# Patient Record
Sex: Female | Born: 1985
Health system: Southern US, Community
[De-identification: ages and names within clinical notes are randomized; demographics above are authoritative.]

## PROBLEM LIST (undated history)

## (undated) ENCOUNTER — Inpatient Hospital Stay (HOSPITAL_COMMUNITY): Payer: Self-pay

## (undated) DIAGNOSIS — Z8759 Personal history of other complications of pregnancy, childbirth and the puerperium: Secondary | ICD-10-CM

## (undated) DIAGNOSIS — U071 COVID-19: Secondary | ICD-10-CM

## (undated) DIAGNOSIS — O139 Gestational [pregnancy-induced] hypertension without significant proteinuria, unspecified trimester: Secondary | ICD-10-CM

## (undated) DIAGNOSIS — L309 Dermatitis, unspecified: Secondary | ICD-10-CM

## (undated) DIAGNOSIS — I10 Essential (primary) hypertension: Secondary | ICD-10-CM

## (undated) DIAGNOSIS — J189 Pneumonia, unspecified organism: Secondary | ICD-10-CM

## (undated) DIAGNOSIS — G43909 Migraine, unspecified, not intractable, without status migrainosus: Secondary | ICD-10-CM

## (undated) DIAGNOSIS — G935 Compression of brain: Secondary | ICD-10-CM

## (undated) DIAGNOSIS — E282 Polycystic ovarian syndrome: Secondary | ICD-10-CM

## (undated) DIAGNOSIS — E119 Type 2 diabetes mellitus without complications: Secondary | ICD-10-CM

## (undated) DIAGNOSIS — Z8632 Personal history of gestational diabetes: Secondary | ICD-10-CM

## (undated) HISTORY — DX: Migraine, unspecified, not intractable, without status migrainosus: G43.909

## (undated) HISTORY — DX: Type 2 diabetes mellitus without complications: E11.9

## (undated) HISTORY — DX: Personal history of gestational diabetes: Z86.32

## (undated) HISTORY — PX: TONSILLECTOMY: SUR1361

## (undated) HISTORY — PX: LAPAROSCOPIC OOPHERECTOMY: SHX6507

## (undated) HISTORY — DX: Essential (primary) hypertension: I10

## (undated) HISTORY — PX: OVARIAN CYST REMOVAL: SHX89

## (undated) HISTORY — DX: Polycystic ovarian syndrome: E28.2

## (undated) HISTORY — DX: Dermatitis, unspecified: L30.9

## (undated) HISTORY — PX: LEEP: SHX91

## (undated) HISTORY — PX: WISDOM TOOTH EXTRACTION: SHX21

## (undated) HISTORY — DX: Personal history of other complications of pregnancy, childbirth and the puerperium: Z87.59

---

## 2006-10-11 ENCOUNTER — Inpatient Hospital Stay (HOSPITAL_COMMUNITY): Admission: AD | Admit: 2006-10-11 | Discharge: 2006-10-15 | Payer: Self-pay | Admitting: Family Medicine

## 2006-10-12 ENCOUNTER — Encounter (INDEPENDENT_AMBULATORY_CARE_PROVIDER_SITE_OTHER): Payer: Self-pay | Admitting: Specialist

## 2008-02-25 ENCOUNTER — Inpatient Hospital Stay (HOSPITAL_COMMUNITY): Admission: AD | Admit: 2008-02-25 | Discharge: 2008-02-25 | Payer: Self-pay | Admitting: Obstetrics and Gynecology

## 2011-01-05 NOTE — Op Note (Signed)
Julia Kline, Julia Kline               ACCOUNT NO.:  [id number]   MEDICAL RECORD NO.:  [id number]          PATIENT TYPE:  MAT   LOCATION:  MATC                          FACILITY:  [facility]   PHYSICIAN:  Sherron Monday, MD        DATE OF BIRTH:  10/05/1985   DATE OF PROCEDURE:  10/12/2006  DATE OF DISCHARGE:                               OPERATIVE REPORT   PREOPERATIVE DIAGNOSIS:  Left complex ovarian cyst with questionable  blood flow.   POSTOPERATIVE DIAGNOSES:  1. Torsed and necrotic left ovary.  2. Right paratubal cyst.   PROCEDURE:  1. Exploratory laparotomy.  2. Left salpingo-oophorectomy.  3. Right paratubal cyst excision.   SURGEON:  Sherron Monday, MD   ASSISTANT:  None.   ANESTHESIA:  General.   PATHOLOGY:  1. Left salpingo-oophorectomy.  2. Right paratubal cyst.   COMPLICATIONS:  None.   ESTIMATED BLOOD LOSS:  150 mL.   INTRAVENOUS FLUIDS:  1400     mL.   URINE OUTPUT:  400 mL of clear urine at the end of the procedure.   FINDINGS:  1. Normal-appearing uterus.  2. Necrotic left tube and ovary, approximately 12 cm, filling the cul-      de-sac.  3. Right paratubal cyst.   DISPOSITION:  Patient to the PACU in stable condition.   DESCRIPTION OF PROCEDURE:  After informed consent was reviewed with the  patient and her family including risks, benefits and alternatives of the  surgical procedure, she was transported to the operating room and placed  on the table in supine position.  General anesthesia was induced and  found to be adequate.  She was then prepped and draped in the normal  sterile fashion.  A Foley catheter was also sterilely placed.  A  Pfannenstiel skin incision was made approximately 2 fingerbreadths above  the pubic symphysis.  Using Bovie cautery, it was carried through to the  underlying layer of fascia.  Fascia was incised in the midline and the  incision was extended laterally with Mayo scissors.  The inferior aspect  of the fascial incision was  grasped with Kocher clamp, elevated and the  rectus muscles were dissected both bluntly and sharply.  The superior  portion of the incision was grasped in a similar fashion and the rectus  muscles were dissected off both bluntly and sharply.  The peritoneum was  easily identified and entered bluntly.  A pelvic survey was performed  revealing the above findings.  An Alexis skin retractor was placed,  verifying no bowel had been caught.  The ovary and tube were pulled from  her cul-de-sac, visualizing the tube and ovary.  The cyst was ruptured,  revealing a malodorous pus discharge.  The cornu was doubly ligated, as  was the IP ligament.  Hemostasis was assured.  Copious irrigation was  performed.  A survey revealed a right paratubal cyst, which was excised  without difficultywith Bovie cautery.  Otherwise, the findings were  normal.  The peritoneum was reapproximated.  The fascia was closed with  0 Vicryl after hemostasis was assured.  The subcuticular  adipose layer  with made hemostatic with Bovie cautery.  Irrigation was performed and  then a fat stitch was placed using plain gut and this again was closed  with staples.  Sponge, lap and needle count was correct x2 at the end of  the procedure per the operating room staff.  The patient tolerated the  procedure well and was awakened in stable condition and transported to  the PACU.      Sherron Monday, MD  Electronically Signed     JB/MEDQ  D:  10/12/2006  T:  10/12/2006  Job:  [id number]

## 2011-01-05 NOTE — Discharge Summary (Signed)
NAMEAMELIE, CARACCI               ACCOUNT NO.:  [id number]   MEDICAL RECORD NO.:  [id number]          PATIENT TYPE:  INP   LOCATION:  9318                          [facility]ACILITY:  [facility]   PHYSICIAN:  Sherron Monday, MD        DATE OF BIRTH:  Sep 13, 1985   DATE OF ADMISSION:  10/11/2006  DATE OF DISCHARGE:  10/15/2006                               DISCHARGE SUMMARY   ADMISSION DIAGNOSIS:  A 9-cm complex ovarian cyst, pelvic pain, likely  torsed ovary.   DISCHARGE DIAGNOSIS:  A 9-cm complex ovarian cyst, pelvic pain, likely  torsed ovary; torsed and necrotic ovary at exploratory laparotomy.   HISTORY OF PRESENT ILLNESS:  A 25 year old G0 with pelvic pain that  started on Monday night. She was seen at [facility] [place] on Tuesday and  diagnosed with 9-cm ovarian cyst. The patient states that they said that  they could see good flow and gave her OCPs to shrink the size of the  cyst. She has continued to have pain increased today with fever and  chills and decreased appetite.   PAST MEDICAL HISTORY:  Significant for what the patient states are boils  on her skin.   PAST SURGICAL HISTORY:  Not significant.   PAST OBSTETRICAL/GYNECOLOGICAL HISTORY:  She is a G0. No history of any  abnormal Pap smears. No history of any Pap smears. No sexually  transmitted diseases.   MEDICATIONS:  None.   ALLERGIES:  SUDAFED AND MORPHINE.   SOCIAL HISTORY:  She denies tobacco, alcohol or drug use. 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   FAMILY HISTORY:  Significant for a father with diabetes. No  hypertension. No cancer. A paternal uncle with coronary artery disease.   At admission, her T-max was 102.6, but she was afebrile at the time of  admission. Vital signs were stable.  In general, she was uncomfortable.  CARDIOVASCULAR:  Regular rate and rhythm.  LUNGS:  Clear to auscultation bilaterally.  ABDOMEN:  Soft, tender to palpation diffusely with decreased bowel  sounds.  EXTREMITIES:   Symmetric and nontender.  PELVIC EXAM:  Bimanual reveals the left greater than right tenderness  with difficulty palpating secondary to the patient's habitus.   LABORATORY VALUES:  An ultrasound revealed a 9-cm left complex cyst with  question of flow and a normal right ovary. Gonorrhea and chlamydia  cultures were negative. White count was 12.0, hemoglobin 9.6, platelets  214. UA revealed nitrites.   So, it was discussed with the patient the risks, benefits and  alternatives including bleeding, infection, damage to the surrounding  organs and the likely diagnosis of a torsed ovary requiring surgery. I  discussed with the patient again the concern of necrosis and infection,  inadequacy of birth control also to control if it is ovarian torsion.  She was also diagnosed with the UTI which was covered with her  antibiotics for surgery. She was given Ancef preoperatively and taken to  the OR for an exploratory laparotomy LSO. She also had a right peritubal  cyst excision at the exploratory laparotomy. Findings revealed necrotic  left tube and ovary approximately 12 to 14 cm in size filling the entire  cul-de-sac and a right peritubal cyst. The ovary and tube were necrotic,  and there was a large cyst filled with foul-smelling pus. Her  postoperative course was uncomplicated. She received 48 hours of  gentamicin, ampicillin and clindamycin IV, and on postoperative day 3,  she was discharged to home. At this time, she was ambulating well,  passing gas and tolerating a regular diet. Had remained afebrile off of  antibiotics for approximately 12 hours.  Her CBC at discharge, her white  count was 9.0, hemoglobin 7.4 which was increased from immediately  postoperatively of 6.7 and decreased from initial of 8.6. Her pain was  well controlled. Her staples were removed, and her incision was noted to  be clean, dry, and intact. She was discharged to home with routine  discharge instructions as well  as numbers to call if any questions or  problems. She was discharged to home with iron, Colace, Motrin and  Vicodin. She will follow up in approximately 2 weeks. She voiced  understanding of these instructions.      Sherron Monday, MD  Electronically Signed     JB/MEDQ  D:  10/15/2006  T:  10/15/2006  Job:  [id number]

## 2011-05-17 LAB — URINALYSIS, ROUTINE W REFLEX MICROSCOPIC
Bilirubin Urine: NEGATIVE
Glucose, UA: NEGATIVE
Ketones, ur: NEGATIVE
Leukocytes, UA: NEGATIVE
Nitrite: NEGATIVE
Protein, ur: NEGATIVE
Specific Gravity, Urine: 1.01
Urobilinogen, UA: 0.2
pH: 6

## 2011-05-17 LAB — CBC
HCT: 32.2 — ABNORMAL LOW
Hemoglobin: 10.4 — ABNORMAL LOW
MCHC: 32.3
MCV: 69.8 — ABNORMAL LOW
Platelets: 257
RBC: 4.61
RDW: 15.4
WBC: 6.1

## 2011-05-17 LAB — POCT PREGNANCY, URINE
Operator id: 120561
Preg Test, Ur: NEGATIVE

## 2011-05-17 LAB — URINE MICROSCOPIC-ADD ON

## 2014-07-23 ENCOUNTER — Encounter (HOSPITAL_BASED_OUTPATIENT_CLINIC_OR_DEPARTMENT_OTHER): Payer: Self-pay

## 2014-07-23 ENCOUNTER — Emergency Department (HOSPITAL_BASED_OUTPATIENT_CLINIC_OR_DEPARTMENT_OTHER)
Admission: EM | Admit: 2014-07-23 | Discharge: 2014-07-24 | Disposition: A | Discharge status: Home / Self Care | Payer: BC Managed Care – PPO | Attending: Emergency Medicine | Admitting: Emergency Medicine

## 2014-07-23 DIAGNOSIS — R51 Headache: Secondary | ICD-10-CM | POA: Insufficient documentation

## 2014-07-23 DIAGNOSIS — Z3202 Encounter for pregnancy test, result negative: Secondary | ICD-10-CM | POA: Insufficient documentation

## 2014-07-23 DIAGNOSIS — R519 Headache, unspecified: Secondary | ICD-10-CM

## 2014-07-23 LAB — URINALYSIS, ROUTINE W REFLEX MICROSCOPIC
Bilirubin Urine: NEGATIVE
Glucose, UA: NEGATIVE mg/dL
Ketones, ur: NEGATIVE mg/dL
Leukocytes, UA: NEGATIVE
Nitrite: NEGATIVE
Protein, ur: NEGATIVE mg/dL
Specific Gravity, Urine: 1.027 (ref 1.005–1.030)
Urobilinogen, UA: 0.2 mg/dL (ref 0.0–1.0)
pH: 5.5 (ref 5.0–8.0)

## 2014-07-23 LAB — URINE MICROSCOPIC-ADD ON

## 2014-07-23 LAB — PREGNANCY, URINE: Preg Test, Ur: NEGATIVE

## 2014-07-23 NOTE — ED Notes (Signed)
Pt reports ha for 2 weeks, today felt numbness to forehead area.

## 2014-07-24 ENCOUNTER — Emergency Department (HOSPITAL_BASED_OUTPATIENT_CLINIC_OR_DEPARTMENT_OTHER): Payer: BC Managed Care – PPO

## 2014-07-24 MED ORDER — SODIUM CHLORIDE 0.9 % IV BOLUS (SEPSIS)
1000.0000 mL | Freq: Once | INTRAVENOUS | Status: DC
  Administered 2014-07-24: 1000 mL via INTRAVENOUS

## 2014-07-24 MED ORDER — KETOROLAC TROMETHAMINE 30 MG/ML IJ SOLN
30.0000 mg | Freq: Once | INTRAMUSCULAR | Status: AC
  Administered 2014-07-24: 30 mg via INTRAVENOUS

## 2014-07-24 NOTE — ED Provider Notes (Addendum)
CSN: [id number]     Arrival date & time 07/23/14  2133 History   First MD Initiated Contact with Patient 07/24/14 0025     Chief Complaint  Patient presents with  . Headache     (Consider location/radiation/quality/duration/timing/severity/associated sxs/prior Treatment) HPI  This is a 28 year old female with about 2-3 week history of intermittent headaches. Headaches are located frontally. They have been moderate in severity. They have been transient and improved with Goody powders. She is here tonight because in addition to the headache she also has numbness and a feeling of heaviness in her for head. She denies photophobia, blurred vision, nausea or, vomiting, focal weakness or numbness. She states this is different than her usual headaches. She rates her headache an 8 out of 10 at the present time.  History reviewed. No pertinent past medical history. Past Surgical History  Procedure Laterality Date  . Ovarian cyst removal     No family history on file. History  Substance Use Topics  . Smoking status: Never Smoker   . Smokeless tobacco: Not on file  . Alcohol Use: No   OB History    No data available     Review of Systems  All other systems reviewed and are negative.   Allergies  Morphine and related  Home Medications   Prior to Admission medications   Medication Sig Start Date End Date Taking? Authorizing Provider  Multiple Vitamin (MULTIVITAMIN) tablet Take 1 tablet by mouth daily.   Yes Historical Provider, MD   BP 138/90 mmHg  Pulse 93  Temp(Src) 98.3 F (36.8 C) (Oral)  Resp 18  Ht 5\' 10"  (1.778 m)  Wt 240 lb (108.863 kg)  BMI 34.44 kg/m2  SpO2 100%  LMP 07/21/2014 (Exact Date)   Physical Exam  General: Well-developed, well-nourished female in no acute distress; appearance consistent with age of record HENT: normocephalic; atraumatic Eyes: pupils equal, round and reactive to light; extraocular muscles intact; no photophobia Neck: supple Heart:  regular rate and rhythm; no murmurs, rubs or gallops Lungs: clear to auscultation bilaterally Abdomen: soft; nondistended; nontender; bowel sounds present Extremities: No deformity; full range of motion; pulses normal Neurologic: Awake, alert and oriented; motor function intact in all extremities and symmetric; no facial droop; normal coordination speech; negative Romberg; normal finger to nose  Skin: Warm and dry Psychiatric: Normal mood and affect    ED Course  Procedures (including critical care time)   MDM   Nursing notes and vitals signs, including pulse oximetry, reviewed.  Summary of this visit's results, reviewed by myself:  Labs:  Results for orders placed or performed during the hospital encounter of 07/23/14 (from the past 24 hour(s))  Urinalysis, Routine w reflex microscopic     Status: Abnormal   Collection Time: 07/23/14  9:41 PM  Result Value Ref Range   Color, Urine YELLOW YELLOW   APPearance CLEAR CLEAR   Specific Gravity, Urine 1.027 1.005 - 1.030   pH 5.5 5.0 - 8.0   Glucose, UA NEGATIVE NEGATIVE mg/dL   Hgb urine dipstick LARGE (A) NEGATIVE   Bilirubin Urine NEGATIVE NEGATIVE   Ketones, ur NEGATIVE NEGATIVE mg/dL   Protein, ur NEGATIVE NEGATIVE mg/dL   Urobilinogen, UA 0.2 0.0 - 1.0 mg/dL   Nitrite NEGATIVE NEGATIVE   Leukocytes, UA NEGATIVE NEGATIVE  Pregnancy, urine     Status: None   Collection Time: 07/23/14  9:41 PM  Result Value Ref Range   Preg Test, Ur NEGATIVE NEGATIVE  Urine microscopic-add on  Status: Abnormal   Collection Time: 07/23/14  9:41 PM  Result Value Ref Range   Squamous Epithelial / LPF FEW (A) RARE   WBC, UA 3-6 <3 WBC/hpf   RBC / HPF 3-6 <3 RBC/hpf   Bacteria, UA MANY (A) RARE    Imaging Studies: Ct Head Wo Contrast  07/24/2014   CLINICAL DATA:  Subacute onset of headache for 2 weeks. Numbness about the forehead today. Initial encounter.  EXAM: CT HEAD WITHOUT CONTRAST  TECHNIQUE: Contiguous axial images were obtained  from the base of the skull through the vertex without intravenous contrast.  COMPARISON:  None.  FINDINGS: There is no evidence of acute infarction, mass lesion, or intra- or extra-axial hemorrhage on CT.  The posterior fossa, including the cerebellum, brainstem and fourth ventricle, is within normal limits. The third and lateral ventricles, and basal ganglia are unremarkable in appearance. The cerebral hemispheres are symmetric in appearance, with normal gray-white differentiation. No mass effect or midline shift is seen.  There is no evidence of fracture; visualized osseous structures are unremarkable in appearance. The orbits are within normal limits. The paranasal sinuses and mastoid air cells are well-aerated. No significant soft tissue abnormalities are seen.  IMPRESSION: Unremarkable noncontrast CT of the head.   Electronically Signed   By: Roanna RaiderJeffery  Chang M.D.   On: 07/24/2014 01:51   1:58 AM Feels better after IV Toradol. She was advised to continue over-the-counter NSAIDs as needed as these have been effective in the past.    Hanley SeamenJohn L Stephania Macfarlane, MD 07/24/14 0158  Hanley SeamenJohn L Shashank Kwasnik, MD 07/24/14 [id number]

## 2015-05-08 ENCOUNTER — Encounter (HOSPITAL_BASED_OUTPATIENT_CLINIC_OR_DEPARTMENT_OTHER): Payer: Self-pay

## 2015-05-08 ENCOUNTER — Emergency Department (HOSPITAL_BASED_OUTPATIENT_CLINIC_OR_DEPARTMENT_OTHER)
Admission: EM | Admit: 2015-05-08 | Discharge: 2015-05-08 | Disposition: A | Discharge status: Home / Self Care | Payer: 59 | Attending: Emergency Medicine | Admitting: Emergency Medicine

## 2015-05-08 DIAGNOSIS — J069 Acute upper respiratory infection, unspecified: Secondary | ICD-10-CM

## 2015-05-08 DIAGNOSIS — O9989 Other specified diseases and conditions complicating pregnancy, childbirth and the puerperium: Secondary | ICD-10-CM | POA: Insufficient documentation

## 2015-05-08 DIAGNOSIS — O99511 Diseases of the respiratory system complicating pregnancy, first trimester: Secondary | ICD-10-CM | POA: Diagnosis not present

## 2015-05-08 DIAGNOSIS — R079 Chest pain, unspecified: Secondary | ICD-10-CM | POA: Insufficient documentation

## 2015-05-08 DIAGNOSIS — Z79899 Other long term (current) drug therapy: Secondary | ICD-10-CM | POA: Insufficient documentation

## 2015-05-08 DIAGNOSIS — Z3A11 11 weeks gestation of pregnancy: Secondary | ICD-10-CM | POA: Diagnosis not present

## 2015-05-08 MED ORDER — GUAIFENESIN 100 MG/5ML PO LIQD: 100.0000 mg | ORAL | Status: DC | PRN

## 2015-05-08 NOTE — ED Provider Notes (Signed)
CSN: [id number]     Arrival date & time 05/08/15  1104 History   First MD Initiated Contact with Patient 05/08/15 1203     Chief Complaint  Patient presents with  . Nasal Congestion     (Consider location/radiation/quality/duration/timing/severity/associated sxs/prior Treatment) HPI Julia Kline is a 29 y.o. female with no medical problems, reports she is [redacted] weeks pregnant, presents to emergency department with cold symptoms. Patient states her symptoms started yesterday. She states she developed sore throat, nasal congestion, cough, chest wall pain with coughing. She states she call her OB/GYN yesterday and was told to take Benadryl and Tylenol. Patient states she took those medications yesterday and feels like they made her feel worse. She denies any abdominal pain, no vaginal discharge or bleeding, and no nausea or vomiting, no headache or neck pain or stiffness. She denies any fever at home. She denies any shortness of breath. No other complaints.  Past Medical History  Diagnosis Date  . Pregnant    Past Surgical History  Procedure Laterality Date  . Ovarian cyst removal     No family history on file. Social History  Substance Use Topics  . Smoking status: Never Smoker   . Smokeless tobacco: None  . Alcohol Use: No   OB History    Gravida Para Term Preterm AB TAB SAB Ectopic Multiple Living   1              Review of Systems  Constitutional: Negative for fever and chills.  HENT: Positive for congestion, ear pain, postnasal drip, rhinorrhea, sinus pressure and sore throat. Negative for trouble swallowing.   Respiratory: Positive for cough. Negative for shortness of breath.   Cardiovascular: Positive for chest pain.  Gastrointestinal: Negative for nausea, vomiting, abdominal pain and diarrhea.  Genitourinary: Negative for vaginal bleeding, vaginal discharge and pelvic pain.      Allergies  Morphine and related  Home Medications   Prior to Admission medications    Medication Sig Start Date End Date Taking? Authorizing Bensyn Bornemann  Multiple Vitamin (MULTIVITAMIN) tablet Take 1 tablet by mouth daily.    Historical Aikam Vinje, MD   BP 127/71 mmHg  Pulse 114  Temp(Src) 98.3 F (36.8 C) (Oral)  Resp 20  Ht  (1.753 m)  SpO2 97%  LMP 02/22/2015 Physical Exam  Constitutional: She is oriented to person, place, and time. She appears well-developed and well-nourished. No distress.  HENT:  Nasal congestion present, clear. Ear canals, TMs, artery are normal bilaterally. Oropharynx is erythematous, tonsils are not enlarged, no exudate, no edema. Postnasal drainage present.  Eyes: Conjunctivae are normal.  Neck: Normal range of motion. Neck supple.  Cardiovascular: Normal rate, regular rhythm and normal heart sounds.   Pulmonary/Chest: Effort normal and breath sounds normal. No respiratory distress. She has no wheezes. She has no rales.  Abdominal: Soft. There is no tenderness.  Neurological: She is alert and oriented to person, place, and time.  Skin: Skin is warm and dry.  Nursing note and vitals reviewed.   ED Course  Procedures (including critical care time) Labs Review Labs Reviewed - No data to display  Imaging Review No results found. I have personally reviewed and evaluated these images and lab results as part of my medical decision-making.   EKG Interpretation None      MDM   Final diagnoses:  URI (upper respiratory infection)    Patient with flulike symptoms. She is [redacted] weeks pregnant with no pelvic or abdominal complaints. She is afebrile  here. Her lungs are clear and exam. Her oxygen saturation is normal. Most likely viral infection given onset yesterday. We will treat symptomatically for now. Tylenol, Benadryl, Robitussin, and intranasal saline. I did tell her to come back or follow-up with her doctor if she is feeling worse. Patient voiced understanding.  Filed Vitals:   05/08/15 1112  BP: 127/71  Pulse: 114  Temp: 98.3 F  (36.8 C)  TempSrc: Oral  Resp: 20  Height:  (1.753 m)  SpO2: 97%       Jaynie Crumble, PA-C 05/08/15 1737  Rolland Porter, MD 05/11/15 1512

## 2015-05-08 NOTE — ED Notes (Signed)
Patient here with dry cough, congestion, and chest wall pain. Runny nose with same, didn't know what meds to take due to pregnancy

## 2015-05-08 NOTE — Discharge Instructions (Signed)
Take Tylenol for pain and fever. Intranasal saline every 2 hours. Benadryl for congestion as well. Take Robitussin as prescribed as needed for cough. Patient to drink plenty of fluids, rest, follow-up with a primary care doctor or OB/GYN. Return if worsening.    Upper Respiratory Infection, Adult An upper respiratory infection (URI) is also sometimes known as the common cold. The upper respiratory tract includes the nose, sinuses, throat, trachea, and bronchi. Bronchi are the airways leading to the lungs. Most people improve within 1 week, but symptoms can last up to 2 weeks. A residual cough may last even longer.  CAUSES Many different viruses can infect the tissues lining the upper respiratory tract. The tissues become irritated and inflamed and often become very moist. Mucus production is also common. A cold is contagious. You can easily spread the virus to others by oral contact. This includes kissing, sharing a glass, coughing, or sneezing. Touching your mouth or nose and then touching a surface, which is then touched by another person, can also spread the virus. SYMPTOMS  Symptoms typically develop 1 to 3 days after you come in contact with a cold virus. Symptoms vary from person to person. They may include:  Runny nose.  Sneezing.  Nasal congestion.  Sinus irritation.  Sore throat.  Loss of voice (laryngitis).  Cough.  Fatigue.  Muscle aches.  Loss of appetite.  Headache.  Low-grade fever. DIAGNOSIS  You might diagnose your own cold based on familiar symptoms, since most people get a cold 2 to 3 times a year. Your caregiver can confirm this based on your exam. Most importantly, your caregiver can check that your symptoms are not due to another disease such as strep throat, sinusitis, pneumonia, asthma, or epiglottitis. Blood tests, throat tests, and X-rays are not necessary to diagnose a common cold, but they may sometimes be helpful in excluding other more serious diseases.  Your caregiver will decide if any further tests are required. RISKS AND COMPLICATIONS  You may be at risk for a more severe case of the common cold if you smoke cigarettes, have chronic heart disease (such as heart failure) or lung disease (such as asthma), or if you have a weakened immune system. The very young and very old are also at risk for more serious infections. Bacterial sinusitis, middle ear infections, and bacterial pneumonia can complicate the common cold. The common cold can worsen asthma and chronic obstructive pulmonary disease (COPD). Sometimes, these complications can require emergency medical care and may be life-threatening. PREVENTION  The best way to protect against getting a cold is to practice good hygiene. Avoid oral or hand contact with people with cold symptoms. Wash your hands often if contact occurs. There is no clear evidence that vitamin C, vitamin E, echinacea, or exercise reduces the chance of developing a cold. However, it is always recommended to get plenty of rest and practice good nutrition. TREATMENT  Treatment is directed at relieving symptoms. There is no cure. Antibiotics are not effective, because the infection is caused by a virus, not by bacteria. Treatment may include:  Increased fluid intake. Sports drinks offer valuable electrolytes, sugars, and fluids.  Breathing heated mist or steam (vaporizer or shower).  Eating chicken soup or other clear broths, and maintaining good nutrition.  Getting plenty of rest.  Using gargles or lozenges for comfort.  Controlling fevers with ibuprofen or acetaminophen as directed by your caregiver.  Increasing usage of your inhaler if you have asthma. Zinc gel and zinc lozenges, taken  in the first 24 hours of the common cold, can shorten the duration and lessen the severity of symptoms. Pain medicines may help with fever, muscle aches, and throat pain. A variety of non-prescription medicines are available to treat  congestion and runny nose. Your caregiver can make recommendations and may suggest nasal or lung inhalers for other symptoms.  HOME CARE INSTRUCTIONS   Only take over-the-counter or prescription medicines for pain, discomfort, or fever as directed by your caregiver.  Use a warm mist humidifier or inhale steam from a shower to increase air moisture. This may keep secretions moist and make it easier to breathe.  Drink enough water and fluids to keep your urine clear or pale yellow.  Rest as needed.  Return to work when your temperature has returned to normal or as your caregiver advises. You may need to stay home longer to avoid infecting others. You can also use a face mask and careful hand washing to prevent spread of the virus. SEEK MEDICAL CARE IF:   After the first few days, you feel you are getting worse rather than better.  You need your caregiver's advice about medicines to control symptoms.  You develop chills, worsening shortness of breath, or brown or red sputum. These may be signs of pneumonia.  You develop yellow or brown nasal discharge or pain in the face, especially when you bend forward. These may be signs of sinusitis.  You develop a fever, swollen neck glands, pain with swallowing, or white areas in the back of your throat. These may be signs of strep throat. SEEK IMMEDIATE MEDICAL CARE IF:   You have a fever.  You develop severe or persistent headache, ear pain, sinus pain, or chest pain.  You develop wheezing, a prolonged cough, cough up blood, or have a change in your usual mucus (if you have chronic lung disease).  You develop sore muscles or a stiff neck. Document Released: 01/30/2001 Document Revised: 10/29/2011 Document Reviewed: 11/11/2013 [facility] Patient Information 2015 [place], [place]. This information is not intended to replace advice given to you by your health care provider. Make sure you discuss any questions you have with your health care  provider.

## 2017-03-01 LAB — CYTOLOGY - PAP: Pap: NEGATIVE

## 2017-03-04 ENCOUNTER — Inpatient Hospital Stay (HOSPITAL_COMMUNITY)
Admission: AD | Admit: 2017-03-04 | Discharge: 2017-03-05 | Disposition: A | Discharge status: Home / Self Care | Payer: Medicaid Other | Source: Ambulatory Visit | Attending: Obstetrics & Gynecology | Admitting: Obstetrics & Gynecology

## 2017-03-04 ENCOUNTER — Encounter (HOSPITAL_COMMUNITY): Payer: Self-pay | Admitting: *Deleted

## 2017-03-04 DIAGNOSIS — R109 Unspecified abdominal pain: Secondary | ICD-10-CM | POA: Diagnosis not present

## 2017-03-04 DIAGNOSIS — Z3A08 8 weeks gestation of pregnancy: Secondary | ICD-10-CM | POA: Diagnosis not present

## 2017-03-04 DIAGNOSIS — O99611 Diseases of the digestive system complicating pregnancy, first trimester: Secondary | ICD-10-CM

## 2017-03-04 DIAGNOSIS — O26891 Other specified pregnancy related conditions, first trimester: Secondary | ICD-10-CM | POA: Insufficient documentation

## 2017-03-04 DIAGNOSIS — K5900 Constipation, unspecified: Secondary | ICD-10-CM

## 2017-03-04 DIAGNOSIS — R1030 Lower abdominal pain, unspecified: Secondary | ICD-10-CM | POA: Diagnosis present

## 2017-03-04 HISTORY — DX: Compression of brain: G93.5

## 2017-03-04 HISTORY — DX: Gestational (pregnancy-induced) hypertension without significant proteinuria, unspecified trimester: O13.9

## 2017-03-04 LAB — URINALYSIS, ROUTINE W REFLEX MICROSCOPIC
Bilirubin Urine: NEGATIVE
Glucose, UA: NEGATIVE mg/dL
Hgb urine dipstick: NEGATIVE
Ketones, ur: 5 mg/dL — AB
Leukocytes, UA: NEGATIVE
Nitrite: NEGATIVE
Protein, ur: 30 mg/dL — AB
Specific Gravity, Urine: 1.03 (ref 1.005–1.030)
pH: 5 (ref 5.0–8.0)

## 2017-03-04 LAB — POCT PREGNANCY, URINE: Preg Test, Ur: POSITIVE — AB

## 2017-03-04 NOTE — MAU Note (Addendum)
PT SAYS SHE HAS PAIN IN LOWER ABD - STARTED    AT 12 NOON-  THEN BECAME WORSE AT 10PM-     [place] [facility] [place] - WAS SEEN LAST  ON 7-13-  ALL OK.  LAST SEX-    June.      NO MEDS  FOR PAIN 10/10- NO CRYING

## 2017-03-05 ENCOUNTER — Encounter (HOSPITAL_COMMUNITY): Payer: Self-pay | Admitting: *Deleted

## 2017-03-05 DIAGNOSIS — K5900 Constipation, unspecified: Secondary | ICD-10-CM

## 2017-03-05 DIAGNOSIS — Z3A08 8 weeks gestation of pregnancy: Secondary | ICD-10-CM

## 2017-03-05 DIAGNOSIS — R109 Unspecified abdominal pain: Secondary | ICD-10-CM

## 2017-03-05 DIAGNOSIS — O26891 Other specified pregnancy related conditions, first trimester: Secondary | ICD-10-CM

## 2017-03-05 DIAGNOSIS — O99611 Diseases of the digestive system complicating pregnancy, first trimester: Secondary | ICD-10-CM | POA: Diagnosis not present

## 2017-03-05 LAB — CBC
HCT: 32.3 % — ABNORMAL LOW (ref 36.0–46.0)
Hemoglobin: 10.5 g/dL — ABNORMAL LOW (ref 12.0–15.0)
MCH: 22.2 pg — ABNORMAL LOW (ref 26.0–34.0)
MCHC: 32.5 g/dL (ref 30.0–36.0)
MCV: 68.1 fL — ABNORMAL LOW (ref 78.0–100.0)
Platelets: 220 10*3/uL (ref 150–400)
RBC: 4.74 MIL/uL (ref 3.87–5.11)
RDW: 14.9 % (ref 11.5–15.5)
WBC: 8.6 10*3/uL (ref 4.0–10.5)

## 2017-03-05 MED ORDER — POLYETHYLENE GLYCOL 3350 17 GM/SCOOP PO POWD: 17.0000 g | Freq: Every day | ORAL | 2 refills | Status: DC | PRN

## 2017-03-05 NOTE — Discharge Instructions (Signed)
First Trimester of Pregnancy °The first trimester of pregnancy is from week 1 until the end of week 13 (months 1 through 3). A week after a sperm fertilizes an egg, the egg will implant on the wall of the uterus. This embryo will begin to develop into a baby. Genes from you and your partner will form the baby. The female genes will determine whether the baby will be a boy or a girl. At 6-8 weeks, the eyes and face will be formed, and the heartbeat can be seen on ultrasound. At the end of 12 weeks, all the baby's organs will be formed. °Now that you are pregnant, you will want to do everything you can to have a healthy baby. Two of the most important things are to get good prenatal care and to follow your health care provider's instructions. Prenatal care is all the medical care you receive before the baby's birth. This care will help prevent, find, and treat any problems during the pregnancy and childbirth. °Body changes during your first trimester °Your body goes through many changes during pregnancy. The changes vary from woman to woman. °· You may gain or lose a couple of pounds at first. °· You may feel sick to your stomach (nauseous) and you may throw up (vomit). If the vomiting is uncontrollable, call your health care provider. °· You may tire easily. °· You may develop headaches that can be relieved by medicines. All medicines should be approved by your health care provider. °· You may urinate more often. Painful urination may mean you have a bladder infection. °· You may develop heartburn as a result of your pregnancy. °· You may develop constipation because certain hormones are causing the muscles that push stool through your intestines to slow down. °· You may develop hemorrhoids or swollen veins (varicose veins). °· Your breasts may begin to grow larger and become tender. Your nipples may stick out more, and the tissue that surrounds them (areola) may become darker. °· Your gums may bleed and may be  sensitive to brushing and flossing. °· Dark spots or blotches (chloasma, mask of pregnancy) may develop on your face. This will likely fade after the baby is born. °· Your menstrual periods will stop. °· You may have a loss of appetite. °· You may develop cravings for certain kinds of food. °· You may have changes in your emotions from day to day, such as being excited to be pregnant or being concerned that something may go wrong with the pregnancy and baby. °· You may have more vivid and strange dreams. °· You may have changes in your hair. These can include thickening of your hair, rapid growth, and changes in texture. Some women also have hair loss during or after pregnancy, or hair that feels dry or thin. Your hair will most likely return to normal after your baby is born. ° °What to expect at prenatal visits °During a routine prenatal visit: °· You will be weighed to make sure you and the baby are growing normally. °· Your blood pressure will be taken. °· Your abdomen will be measured to track your baby's growth. °· The fetal heartbeat will be listened to between weeks 10 and 14 of your pregnancy. °· Test results from any previous visits will be discussed. ° °Your health care provider may ask you: °· How you are feeling. °· If you are feeling the baby move. °· If you have had any abnormal symptoms, such as leaking fluid, bleeding, severe headaches,   or abdominal cramping. °· If you are using any tobacco products, including cigarettes, chewing tobacco, and electronic cigarettes. °· If you have any questions. ° °Other tests that may be performed during your first trimester include: °· Blood tests to find your blood type and to check for the presence of any previous infections. The tests will also be used to check for low iron levels (anemia) and protein on red blood cells (Rh antibodies). Depending on your risk factors, or if you previously had diabetes during pregnancy, you may have tests to check for high blood  sugar that affects pregnant women (gestational diabetes). °· Urine tests to check for infections, diabetes, or protein in the urine. °· An ultrasound to confirm the proper growth and development of the baby. °· Fetal screens for spinal cord problems (spina bifida) and Down syndrome. °· HIV (human immunodeficiency virus) testing. Routine prenatal testing includes screening for HIV, unless you choose not to have this test. °· You may need other tests to make sure you and the baby are doing well. ° °Follow these instructions at home: °Medicines °· Follow your health care provider's instructions regarding medicine use. Specific medicines may be either safe or unsafe to take during pregnancy. °· Take a prenatal vitamin that contains at least 600 micrograms (mcg) of folic acid. °· If you develop constipation, try taking a stool softener if your health care provider approves. °Eating and drinking °· Eat a balanced diet that includes fresh fruits and vegetables, whole grains, good sources of protein such as meat, eggs, or tofu, and low-fat dairy. Your health care provider will help you determine the amount of weight gain that is right for you. °· Avoid raw meat and uncooked cheese. These carry germs that can cause birth defects in the baby. °· Eating four or five small meals rather than three large meals a day may help relieve nausea and vomiting. If you start to feel nauseous, eating a few soda crackers can be helpful. Drinking liquids between meals, instead of during meals, also seems to help ease nausea and vomiting. °· Limit foods that are high in fat and processed sugars, such as fried and sweet foods. °· To prevent constipation: °? Eat foods that are high in fiber, such as fresh fruits and vegetables, whole grains, and beans. °? Drink enough fluid to keep your urine clear or pale yellow. °Activity °· Exercise only as directed by your health care provider. Most women can continue their usual exercise routine during  pregnancy. Try to exercise for 30 minutes at least 5 days a week. Exercising will help you: °? Control your weight. °? Stay in shape. °? Be prepared for labor and delivery. °· Experiencing pain or cramping in the lower abdomen or lower back is a good sign that you should stop exercising. Check with your health care provider before continuing with normal exercises. °· Try to avoid standing for long periods of time. Move your legs often if you must stand in one place for a long time. °· Avoid heavy lifting. °· Wear low-heeled shoes and practice good posture. °· You may continue to have sex unless your health care provider tells you not to. °Relieving pain and discomfort °· Wear a good support bra to relieve breast tenderness. °· Take warm sitz baths to soothe any pain or discomfort caused by hemorrhoids. Use hemorrhoid cream if your health care provider approves. °· Rest with your legs elevated if you have leg cramps or low back pain. °· If you develop   varicose veins in your legs, wear support hose. Elevate your feet for 15 minutes, 3-4 times a day. Limit salt in your diet. Prenatal care  Schedule your prenatal visits by the twelfth week of pregnancy. They are usually scheduled monthly at first, then more often in the last 2 months before delivery.  Write down your questions. Take them to your prenatal visits.  Keep all your prenatal visits as told by your health care provider. This is important. Safety  Wear your seat belt at all times when driving.  Make a list of emergency phone numbers, including numbers for family, friends, the hospital, and police and fire departments. General instructions  Ask your health care provider for a referral to a local prenatal education class. Begin classes no later than the beginning of month 6 of your pregnancy.  Ask for help if you have counseling or nutritional needs during pregnancy. Your health care provider can offer advice or refer you to specialists for help  with various needs.  Do not use hot tubs, steam rooms, or saunas.  Do not douche or use tampons or scented sanitary pads.  Do not cross your legs for long periods of time.  Avoid cat litter boxes and soil used by cats. These carry germs that can cause birth defects in the baby and possibly loss of the fetus by miscarriage or stillbirth.  Avoid all smoking, herbs, alcohol, and medicines not prescribed by your health care provider. Chemicals in these products affect the formation and growth of the baby.  Do not use any products that contain nicotine or tobacco, such as cigarettes and e-cigarettes. If you need help quitting, ask your health care provider. You may receive counseling support and other resources to help you quit.  Schedule a dentist appointment. At home, brush your teeth with a soft toothbrush and be gentle when you floss. Contact a health care provider if:  You have dizziness.  You have mild pelvic cramps, pelvic pressure, or nagging pain in the abdominal area.  You have persistent nausea, vomiting, or diarrhea.  You have a bad smelling vaginal discharge.  You have pain when you urinate.  You notice increased swelling in your face, hands, legs, or ankles.  You are exposed to fifth disease or chickenpox.  You are exposed to [place] measles (rubella) and have never had it. Get help right away if:  You have a fever.  You are leaking fluid from your vagina.  You have spotting or bleeding from your vagina.  You have severe abdominal cramping or pain.  You have rapid weight gain or loss.  You vomit blood or material that looks like coffee grounds.  You develop a severe headache.  You have shortness of breath.  You have any kind of trauma, such as from a fall or a car accident. Summary  The first trimester of pregnancy is from week 1 until the end of week 13 (months 1 through 3).  Your body goes through many changes during pregnancy. The changes vary from  woman to woman.  You will have routine prenatal visits. During those visits, your health care provider will examine you, discuss any test results you may have, and talk with you about how you are feeling. This information is not intended to replace advice given to you by your health care provider. Make sure you discuss any questions you have with your health care provider. Document Released: 07/31/2001 Document Revised: 07/18/2016 Document Reviewed: 07/18/2016 Elsevier Interactive Patient Education  2017 Elsevier  Inc[employer] Constipation, Adult Constipation is when a person has fewer bowel movements in a week than normal, has difficulty having a bowel movement, or has stools that are dry, hard, or larger than normal. Constipation may be caused by an underlying condition. It may become worse with age if a person takes certain medicines and does not take in enough fluids. Follow these instructions at home: Eating and drinking   Eat foods that have a lot of fiber, such as fresh fruits and vegetables, whole grains, and beans.  Limit foods that are high in fat, low in fiber, or overly processed, such as french fries, hamburgers, cookies, candies, and soda.  Drink enough fluid to keep your urine clear or pale yellow. General instructions  Exercise regularly or as told by your health care provider.  Go to the restroom when you have the urge to go. Do not hold it in.  Take over-the-counter and prescription medicines only as told by your health care provider. These include any fiber supplements.  Practice pelvic floor retraining exercises, such as deep breathing while relaxing the lower abdomen and pelvic floor relaxation during bowel movements.  Watch your condition for any changes.  Keep all follow-up visits as told by your health care provider. This is important. Contact a health care provider if:  You have pain that gets worse.  You have a fever.  You do not have a bowel movement after 4  days.  You vomit.  You are not hungry.  You lose weight.  You are bleeding from the anus.  You have thin, pencil-like stools. Get help right away if:  You have a fever and your symptoms suddenly get worse.  You leak stool or have blood in your stool.  Your abdomen is bloated.  You have severe pain in your abdomen.  You feel dizzy or you faint. This information is not intended to replace advice given to you by your health care provider. Make sure you discuss any questions you have with your health care provider. Document Released: 05/04/2004 Document Revised: 02/24/2016 Document Reviewed: 01/25/2016 Elsevier Interactive Patient Education  2017 [employer]

## 2017-03-05 NOTE — MAU Provider Note (Signed)
History     CSN: [id number]  Arrival date and time: 03/04/17 2248   First Provider Initiated Contact with Patient 03/05/17 0011      Chief Complaint  Patient presents with  . Abdominal Pain   HPI Julia Kline is a 31 y.o. G2P1001 at [redacted]w[redacted]d who presents with lower abdominal cramping. She states it started yesterday morning but got worse around 2200. She rates the pain a 10/10 and has not tried anything for the pain. She denies any vaginal bleeding or abnormal discharge. Patient states she had a ultrasound and vaginal cultures on Friday and everything was "normal."  OB History    Gravida Para Term Preterm AB Living   2 1 1  0   1   SAB TAB Ectopic Multiple Live Births           1      Past Medical History:  Diagnosis Date  . Gestational diabetes    insulin controlled  . Hernia cerebri (HCC)   . Pregnancy induced hypertension   . Pregnant   . Vaginal Pap smear, abnormal     Past Surgical History:  Procedure Laterality Date  . LEEP    . OVARIAN CYST REMOVAL    . TONSILLECTOMY    . WISDOM TOOTH EXTRACTION      Family History  Problem Relation Age of Onset  . Diabetes Mother   . Diabetes Father   . Hypertension Maternal Grandmother     Social History  Substance Use Topics  . Smoking status: Never Smoker  . Smokeless tobacco: Never Used  . Alcohol use No    Allergies:  Allergies  Allergen Reactions  . Morphine And Related Rash    Prescriptions Prior to Admission  Medication Sig Dispense Refill Last Dose  . prenatal vitamin w/FE, FA (PRENATAL 1 + 1) 27-1 MG TABS tablet Take 1 tablet by mouth daily at 12 noon.   03/03/2017 at Unknown time  . Pyridoxine HCl (VITAMIN B-6) 500 MG tablet Take 500 mg by mouth 2 (two) times daily.   03/04/2017 at Unknown time  . guaiFENesin (ROBITUSSIN) 100 MG/5ML liquid Take 5-10 mLs (100-200 mg total) by mouth every 4 (four) hours as needed for cough. 60 mL 0   . Multiple Vitamin (MULTIVITAMIN) tablet Take 1 tablet by mouth  daily.       Review of Systems  Constitutional: Negative.  Negative for chills and fever.  HENT: Negative.   Respiratory: Negative.  Negative for shortness of breath.   Cardiovascular: Negative.  Negative for chest pain.  Gastrointestinal: Positive for abdominal pain and constipation. Negative for diarrhea, nausea and vomiting.  Genitourinary: Negative.  Negative for dysuria, vaginal bleeding and vaginal discharge.  Neurological: Negative.  Negative for dizziness and headaches.  Psychiatric/Behavioral: Negative.    Physical Exam   Blood pressure 126/84, pulse 90, temperature 98.3 F (36.8 C), temperature source Oral, resp. rate 20, height 5\' 9"  (1.753 m), weight 245 lb 8 oz (111.4 kg), last menstrual period 12/25/2016.  Physical Exam  Nursing note and vitals reviewed. Constitutional: She appears well-developed and well-nourished.  HENT:  Head: Normocephalic and atraumatic.  Eyes: Conjunctivae are normal. No scleral icterus.  Cardiovascular: Normal rate, regular rhythm and normal heart sounds.   Respiratory: Effort normal. No respiratory distress.  GI: Soft. There is tenderness.  Genitourinary: No vaginal discharge found.  Neurological: She is alert.  Skin: Skin is warm and dry.  Psychiatric: She has a normal mood and affect. Her behavior is normal.  Judgment and thought content normal.   Bimanual exam: Cervix 0/long/high, firm, anterior, neg CMT, uterus nontender, nonenlarged, adnexa without tenderness, enlargement, or mass  MAU Course  Procedures Results for orders placed or performed during the hospital encounter of 03/04/17 (from the past 24 hour(s))  Urinalysis, Routine w reflex microscopic     Status: Abnormal   Collection Time: 03/04/17 11:05 PM  Result Value Ref Range   Color, Urine YELLOW YELLOW   APPearance HAZY (A) CLEAR   Specific Gravity, Urine 1.030 1.005 - 1.030   pH 5.0 5.0 - 8.0   Glucose, UA NEGATIVE NEGATIVE mg/dL   Hgb urine dipstick NEGATIVE NEGATIVE    Bilirubin Urine NEGATIVE NEGATIVE   Ketones, ur 5 (A) NEGATIVE mg/dL   Protein, ur 30 (A) NEGATIVE mg/dL   Nitrite NEGATIVE NEGATIVE   Leukocytes, UA NEGATIVE NEGATIVE   RBC / HPF 6-30 0 - 5 RBC/hpf   WBC, UA 0-5 0 - 5 WBC/hpf   Bacteria, UA RARE (A) NONE SEEN   Squamous Epithelial / LPF 0-5 (A) NONE SEEN   Mucous PRESENT   Pregnancy, urine POC     Status: Abnormal   Collection Time: 03/04/17 11:19 PM  Result Value Ref Range   Preg Test, Ur POSITIVE (A) NEGATIVE  CBC     Status: Abnormal   Collection Time: 03/05/17 12:11 AM  Result Value Ref Range   WBC 8.6 4.0 - 10.5 K/uL   RBC 4.74 3.87 - 5.11 MIL/uL   Hemoglobin 10.5 (L) 12.0 - 15.0 g/dL   HCT 16.132.3 (L) 09.636.0 - 04.546.0 %   MCV 68.1 (L) 78.0 - 100.0 fL   MCH 22.2 (L) 26.0 - 34.0 pg   MCHC 32.5 30.0 - 36.0 g/dL   RDW 40.914.9 81.111.5 - 91.415.5 %   Platelets 220 150 - 400 K/uL    MDM UA, UPT CBC V. Tejasvi Brissett CNM performed bedside u/s: IUP with FHR 171 bpm Assessment and Plan   1. Abdominal pain during pregnancy in first trimester   2. Constipation during pregnancy in first trimester   3. [redacted] weeks gestation of pregnancy    -Discharge patient home in stable condition -Encouraged high fiber foods, increased water and miralax for constipation -[facility] as [facility] for [facility] care -Encouraged to return here or to other Urgent Care/ED if she develops worsening of symptoms, increase in pain, fever, or other concerning symptoms.   Cleone SlimCaroline Neill SNM 03/05/2017, 12:45 AM   I confirm that I have verified the information documented in the nurse midwife student's note and that I have also personally reperformed the physical exam and all medical decision making activities.Ectopic previously ruler out. No evidence of SAB in progress. Suspect pain is from Round ligament pain and or constipation especially since pain is mor in the area of GI system.    Katrinka BlazingSmith, [place], [place] 03/05/2017 3:39 AM

## 2017-04-01 LAB — OB RESULTS CONSOLE ABO/RH: RH Type: POSITIVE

## 2017-04-01 LAB — OB RESULTS CONSOLE GC/CHLAMYDIA
Chlamydia: NEGATIVE
Gonorrhea: NEGATIVE

## 2017-04-01 LAB — OB RESULTS CONSOLE RPR: RPR: NONREACTIVE

## 2017-04-01 LAB — OB RESULTS CONSOLE ANTIBODY SCREEN: Antibody Screen: NEGATIVE

## 2017-04-01 LAB — OB RESULTS CONSOLE TSH: TSH: 1.24

## 2017-04-01 LAB — OB RESULTS CONSOLE HEPATITIS B SURFACE ANTIGEN: Hepatitis B Surface Ag: NEGATIVE

## 2017-04-01 LAB — OB RESULTS CONSOLE HIV ANTIBODY (ROUTINE TESTING): HIV: NONREACTIVE

## 2017-04-01 LAB — OB RESULTS CONSOLE PLATELET COUNT: Platelets: 252

## 2017-04-01 LAB — OB RESULTS CONSOLE HGB/HCT, BLOOD
HCT: 34
Hemoglobin: 10.8

## 2017-04-01 LAB — OB RESULTS CONSOLE RUBELLA ANTIBODY, IGM: Rubella: IMMUNE

## 2017-04-09 ENCOUNTER — Telehealth: Payer: Self-pay | Admitting: General Practice

## 2017-04-09 NOTE — Telephone Encounter (Signed)
Called and left message on VM in regards to New OB appointment on 04/24/17 at 1:00pm with Dr. Alysia Penna.  Asked patient to give our office a call if unable to keep this appointment.

## 2017-04-23 ENCOUNTER — Encounter: Payer: Self-pay | Admitting: *Deleted

## 2017-04-23 DIAGNOSIS — Z3480 Encounter for supervision of other normal pregnancy, unspecified trimester: Secondary | ICD-10-CM

## 2017-04-23 DIAGNOSIS — O0990 Supervision of high risk pregnancy, unspecified, unspecified trimester: Secondary | ICD-10-CM | POA: Insufficient documentation

## 2017-04-24 ENCOUNTER — Encounter: Payer: Medicaid Other | Admitting: Obstetrics and Gynecology

## 2017-05-02 ENCOUNTER — Encounter: Payer: Self-pay | Admitting: Family Medicine

## 2017-05-02 DIAGNOSIS — O3440 Maternal care for other abnormalities of cervix, unspecified trimester: Secondary | ICD-10-CM | POA: Insufficient documentation

## 2017-05-02 DIAGNOSIS — Z8759 Personal history of other complications of pregnancy, childbirth and the puerperium: Secondary | ICD-10-CM | POA: Insufficient documentation

## 2017-05-02 DIAGNOSIS — Z8632 Personal history of gestational diabetes: Secondary | ICD-10-CM

## 2017-05-02 DIAGNOSIS — Z9889 Other specified postprocedural states: Secondary | ICD-10-CM | POA: Insufficient documentation

## 2017-05-02 DIAGNOSIS — O34219 Maternal care for unspecified type scar from previous cesarean delivery: Secondary | ICD-10-CM | POA: Insufficient documentation

## 2017-05-02 HISTORY — DX: Personal history of gestational diabetes: Z86.32

## 2017-05-02 HISTORY — DX: Personal history of other complications of pregnancy, childbirth and the puerperium: Z87.59

## 2017-05-02 NOTE — Progress Notes (Signed)
Updated Problem list from Care Everywhere Abstracted Pap Smear and OB labs Pt has not had any carrier screening, CF, Hgb eval, or SMA, at previous OB

## 2017-05-03 ENCOUNTER — Encounter: Payer: Self-pay | Admitting: Family Medicine

## 2017-05-03 ENCOUNTER — Ambulatory Visit (INDEPENDENT_AMBULATORY_CARE_PROVIDER_SITE_OTHER): Payer: Medicaid Other | Admitting: Family Medicine

## 2017-05-03 DIAGNOSIS — O09292 Supervision of pregnancy with other poor reproductive or obstetric history, second trimester: Secondary | ICD-10-CM

## 2017-05-03 DIAGNOSIS — O34219 Maternal care for unspecified type scar from previous cesarean delivery: Secondary | ICD-10-CM

## 2017-05-03 DIAGNOSIS — O0992 Supervision of high risk pregnancy, unspecified, second trimester: Secondary | ICD-10-CM

## 2017-05-03 DIAGNOSIS — O09299 Supervision of pregnancy with other poor reproductive or obstetric history, unspecified trimester: Secondary | ICD-10-CM

## 2017-05-03 DIAGNOSIS — Z8632 Personal history of gestational diabetes: Secondary | ICD-10-CM

## 2017-05-03 DIAGNOSIS — O0990 Supervision of high risk pregnancy, unspecified, unspecified trimester: Secondary | ICD-10-CM

## 2017-05-03 MED ORDER — ASPIRIN 81 MG PO CHEW: 81.0000 mg | CHEWABLE_TABLET | Freq: Every day | ORAL | 3 refills | Status: DC

## 2017-05-03 NOTE — Patient Instructions (Signed)
 Second Trimester of Pregnancy The second trimester is from week 14 through week 27 (months 4 through 6). The second trimester is often a time when you feel your best. Your body has adjusted to being pregnant, and you begin to feel better physically. Usually, morning sickness has lessened or quit completely, you may have more energy, and you may have an increase in appetite. The second trimester is also a time when the fetus is growing rapidly. At the end of the sixth month, the fetus is about 9 inches long and weighs about 1 pounds. You will likely begin to feel the baby move (quickening) between 16 and 20 weeks of pregnancy. Body changes during your second trimester Your body continues to go through many changes during your second trimester. The changes vary from woman to woman.  Your weight will continue to increase. You will notice your lower abdomen bulging out.  You may begin to get stretch marks on your hips, abdomen, and breasts.  You may develop headaches that can be relieved by medicines. The medicines should be approved by your health care provider.  You may urinate more often because the fetus is pressing on your bladder.  You may develop or continue to have heartburn as a result of your pregnancy.  You may develop constipation because certain hormones are causing the muscles that push waste through your intestines to slow down.  You may develop hemorrhoids or swollen, bulging veins (varicose veins).  You may have back pain. This is caused by: ? Weight gain. ? Pregnancy hormones that are relaxing the joints in your pelvis. ? A shift in weight and the muscles that support your balance.  Your breasts will continue to grow and they will continue to become tender.  Your gums may bleed and may be sensitive to brushing and flossing.  Dark spots or blotches (chloasma, mask of pregnancy) may develop on your face. This will likely fade after the baby is born.  A dark line from  your belly button to the pubic area (linea nigra) may appear. This will likely fade after the baby is born.  You may have changes in your hair. These can include thickening of your hair, rapid growth, and changes in texture. Some women also have hair loss during or after pregnancy, or hair that feels dry or thin. Your hair will most likely return to normal after your baby is born.  What to expect at prenatal visits During a routine prenatal visit:  You will be weighed to make sure you and the fetus are growing normally.  Your blood pressure will be taken.  Your abdomen will be measured to track your baby's growth.  The fetal heartbeat will be listened to.  Any test results from the previous visit will be discussed.  Your health care provider may ask you:  How you are feeling.  If you are feeling the baby move.  If you have had any abnormal symptoms, such as leaking fluid, bleeding, severe headaches, or abdominal cramping.  If you are using any tobacco products, including cigarettes, chewing tobacco, and electronic cigarettes.  If you have any questions.  Other tests that may be performed during your second trimester include:  Blood tests that check for: ? Low iron levels (anemia). ? High blood sugar that affects pregnant women (gestational diabetes) between 24 and 28 weeks. ? Rh antibodies. This is to check for a protein on red blood cells (Rh factor).  Urine tests to check for infections, diabetes,   or protein in the urine.  An ultrasound to confirm the proper growth and development of the baby.  An amniocentesis to check for possible genetic problems.  Fetal screens for spina bifida and Down syndrome.  HIV (human immunodeficiency virus) testing. Routine prenatal testing includes screening for HIV, unless you choose not to have this test.  Follow these instructions at home: Medicines  Follow your health care provider's instructions regarding medicine use. Specific  medicines may be either safe or unsafe to take during pregnancy.  Take a prenatal vitamin that contains at least 600 micrograms (mcg) of folic acid.  If you develop constipation, try taking a stool softener if your health care provider approves. Eating and drinking  Eat a balanced diet that includes fresh fruits and vegetables, whole grains, good sources of protein such as meat, eggs, or tofu, and low-fat dairy. Your health care provider will help you determine the amount of weight gain that is right for you.  Avoid raw meat and uncooked cheese. These carry germs that can cause birth defects in the baby.  If you have low calcium intake from food, talk to your health care provider about whether you should take a daily calcium supplement.  Limit foods that are high in fat and processed sugars, such as fried and sweet foods.  To prevent constipation: ? Drink enough fluid to keep your urine clear or pale yellow. ? Eat foods that are high in fiber, such as fresh fruits and vegetables, whole grains, and beans. Activity  Exercise only as directed by your health care provider. Most women can continue their usual exercise routine during pregnancy. Try to exercise for 30 minutes at least 5 days a week. Stop exercising if you experience uterine contractions.  Avoid heavy lifting, wear low heel shoes, and practice good posture.  A sexual relationship may be continued unless your health care provider directs you otherwise. Relieving pain and discomfort  Wear a good support bra to prevent discomfort from breast tenderness.  Take warm sitz baths to soothe any pain or discomfort caused by hemorrhoids. Use hemorrhoid cream if your health care provider approves.  Rest with your legs elevated if you have leg cramps or low back pain.  If you develop varicose veins, wear support hose. Elevate your feet for 15 minutes, 3-4 times a day. Limit salt in your diet. Prenatal Care  Write down your questions.  Take them to your prenatal visits.  Keep all your prenatal visits as told by your health care provider. This is important. Safety  Wear your seat belt at all times when driving.  Make a list of emergency phone numbers, including numbers for family, friends, the hospital, and police and fire departments. General instructions  Ask your health care provider for a referral to a local prenatal education class. Begin classes no later than the beginning of month 6 of your pregnancy.  Ask for help if you have counseling or nutritional needs during pregnancy. Your health care provider can offer advice or refer you to specialists for help with various needs.  Do not use hot tubs, steam rooms, or saunas.  Do not douche or use tampons or scented sanitary pads.  Do not cross your legs for long periods of time.  Avoid cat litter boxes and soil used by cats. These carry germs that can cause birth defects in the baby and possibly loss of the fetus by miscarriage or stillbirth.  Avoid all smoking, herbs, alcohol, and unprescribed drugs. Chemicals in these products   can affect the formation and growth of the baby.  Do not use any products that contain nicotine or tobacco, such as cigarettes and e-cigarettes. If you need help quitting, ask your health care provider.  Visit your dentist if you have not gone yet during your pregnancy. Use a soft toothbrush to brush your teeth and be gentle when you floss. Contact a health care provider if:  You have dizziness.  You have mild pelvic cramps, pelvic pressure, or nagging pain in the abdominal area.  You have persistent nausea, vomiting, or diarrhea.  You have a bad smelling vaginal discharge.  You have pain when you urinate. Get help right away if:  You have a fever.  You are leaking fluid from your vagina.  You have spotting or bleeding from your vagina.  You have severe abdominal cramping or pain.  You have rapid weight gain or weight  loss.  You have shortness of breath with chest pain.  You notice sudden or extreme swelling of your face, hands, ankles, feet, or legs.  You have not felt your baby move in over an hour.  You have severe headaches that do not go away when you take medicine.  You have vision changes. Summary  The second trimester is from week 14 through week 27 (months 4 through 6). It is also a time when the fetus is growing rapidly.  Your body goes through many changes during pregnancy. The changes vary from woman to woman.  Avoid all smoking, herbs, alcohol, and unprescribed drugs. These chemicals affect the formation and growth your baby.  Do not use any tobacco products, such as cigarettes, chewing tobacco, and e-cigarettes. If you need help quitting, ask your health care provider.  Contact your health care provider if you have any questions. Keep all prenatal visits as told by your health care provider. This is important. This information is not intended to replace advice given to you by your health care provider. Make sure you discuss any questions you have with your health care provider. Document Released: 07/31/2001 Document Revised: 01/12/2016 Document Reviewed: 10/07/2012 [employer] Interactive Patient Education  2017 [employer]   Breastfeeding Deciding to breastfeed is one of the best choices you can make for you and your baby. A change in hormones during pregnancy causes your breast tissue to grow and increases the number and size of your milk ducts. These hormones also allow proteins, sugars, and fats from your blood supply to make breast milk in your milk-producing glands. Hormones prevent breast milk from being released before your baby is born as well as prompt milk flow after birth. Once breastfeeding has begun, thoughts of your baby, as well as his or her sucking or crying, can stimulate the release of milk from your milk-producing glands. Benefits of breastfeeding For Your  Baby  Your first milk (colostrum) helps your baby's digestive system function better.  There are antibodies in your milk that help your baby fight off infections.  Your baby has a lower incidence of asthma, allergies, and sudden infant death syndrome.  The nutrients in breast milk are better for your baby than infant formulas and are designed uniquely for your baby's needs.  Breast milk improves your baby's brain development.  Your baby is less likely to develop other conditions, such as childhood obesity, asthma, or type 2 diabetes mellitus.  For You  Breastfeeding helps to create a very special bond between you and your baby.  Breastfeeding is convenient. Breast milk is always available at   the correct temperature and costs nothing.  Breastfeeding helps to burn calories and helps you lose the weight gained during pregnancy.  Breastfeeding makes your uterus contract to its prepregnancy size faster and slows bleeding (lochia) after you give birth.  Breastfeeding helps to lower your risk of developing type 2 diabetes mellitus, osteoporosis, and breast or ovarian cancer later in life.  Signs that your baby is hungry Early Signs of Hunger  Increased alertness or activity.  Stretching.  Movement of the head from side to side.  Movement of the head and opening of the mouth when the corner of the mouth or cheek is stroked (rooting).  Increased sucking sounds, smacking lips, cooing, sighing, or squeaking.  Hand-to-mouth movements.  Increased sucking of fingers or hands.  Late Signs of Hunger  Fussing.  Intermittent crying.  Extreme Signs of Hunger Signs of extreme hunger will require calming and consoling before your baby will be able to breastfeed successfully. Do not wait for the following signs of extreme hunger to occur before you initiate breastfeeding:  Restlessness.  A loud, strong cry.  Screaming.  Breastfeeding basics Breastfeeding Initiation  Find a  comfortable place to sit or lie down, with your neck and back well supported.  Place a pillow or rolled up blanket under your baby to bring him or her to the level of your breast (if you are seated). Nursing pillows are specially designed to help support your arms and your baby while you breastfeed.  Make sure that your baby's abdomen is facing your abdomen.  Gently massage your breast. With your fingertips, massage from your chest wall toward your nipple in a circular motion. This encourages milk flow. You may need to continue this action during the feeding if your milk flows slowly.  Support your breast with 4 fingers underneath and your thumb above your nipple. Make sure your fingers are well away from your nipple and your baby's mouth.  Stroke your baby's lips gently with your finger or nipple.  When your baby's mouth is open wide enough, quickly bring your baby to your breast, placing your entire nipple and as much of the colored area around your nipple (areola) as possible into your baby's mouth. ? More areola should be visible above your baby's upper lip than below the lower lip. ? Your baby's tongue should be between his or her lower gum and your breast.  Ensure that your baby's mouth is correctly positioned around your nipple (latched). Your baby's lips should create a seal on your breast and be turned out (everted).  It is common for your baby to suck about 2-3 minutes in order to start the flow of breast milk.  Latching Teaching your baby how to latch on to your breast properly is very important. An improper latch can cause nipple pain and decreased milk supply for you and poor weight gain in your baby. Also, if your baby is not latched onto your nipple properly, he or she may swallow some air during feeding. This can make your baby fussy. Burping your baby when you switch breasts during the feeding can help to get rid of the air. However, teaching your baby to latch on properly is  still the best way to prevent fussiness from swallowing air while breastfeeding. Signs that your baby has successfully latched on to your nipple:  Silent tugging or silent sucking, without causing you pain.  Swallowing heard between every 3-4 sucks.  Muscle movement above and in front of his or her   ears while sucking.  Signs that your baby has not successfully latched on to nipple:  Sucking sounds or smacking sounds from your baby while breastfeeding.  Nipple pain.  If you think your baby has not latched on correctly, slip your finger into the corner of your baby's mouth to break the suction and place it between your baby's gums. Attempt breastfeeding initiation again. Signs of Successful Breastfeeding Signs from your baby:  A gradual decrease in the number of sucks or complete cessation of sucking.  Falling asleep.  Relaxation of his or her body.  Retention of a small amount of milk in his or her mouth.  Letting go of your breast by himself or herself.  Signs from you:  Breasts that have increased in firmness, weight, and size 1-3 hours after feeding.  Breasts that are softer immediately after breastfeeding.  Increased milk volume, as well as a change in milk consistency and color by the fifth day of breastfeeding.  Nipples that are not sore, cracked, or bleeding.  Signs That Your Baby is Getting Enough Milk  Wetting at least 1-2 diapers during the first 24 hours after birth.  Wetting at least 5-6 diapers every 24 hours for the first week after birth. The urine should be clear or pale yellow by 5 days after birth.  Wetting 6-8 diapers every 24 hours as your baby continues to grow and develop.  At least 3 stools in a 24-hour period by age 5 days. The stool should be soft and yellow.  At least 3 stools in a 24-hour period by age 7 days. The stool should be seedy and yellow.  No loss of weight greater than 10% of birth weight during the first 3 days of age.  Average  weight gain of 4-7 ounces (113-198 g) per week after age 4 days.  Consistent daily weight gain by age 5 days, without weight loss after the age of 2 weeks.  After a feeding, your baby may spit up a small amount. This is common. Breastfeeding frequency and duration Frequent feeding will help you make more milk and can prevent sore nipples and breast engorgement. Breastfeed when you feel the need to reduce the fullness of your breasts or when your baby shows signs of hunger. This is called "breastfeeding on demand." Avoid introducing a pacifier to your baby while you are working to establish breastfeeding (the first 4-6 weeks after your baby is born). After this time you may choose to use a pacifier. Research has shown that pacifier use during the first year of a baby's life decreases the risk of sudden infant death syndrome (SIDS). Allow your baby to feed on each breast as long as he or she wants. Breastfeed until your baby is finished feeding. When your baby unlatches or falls asleep while feeding from the first breast, offer the second breast. Because newborns are often sleepy in the first few weeks of life, you may need to awaken your baby to get him or her to feed. Breastfeeding times will vary from baby to baby. However, the following rules can serve as a guide to help you ensure that your baby is properly fed:  Newborns (babies 4 weeks of age or younger) may breastfeed every 1-3 hours.  Newborns should not go longer than 3 hours during the day or 5 hours during the night without breastfeeding.  You should breastfeed your baby a minimum of 8 times in a 24-hour period until you begin to introduce solid foods to your   baby at around 6 months of age.  Breast milk pumping Pumping and storing breast milk allows you to ensure that your baby is exclusively fed your breast milk, even at times when you are unable to breastfeed. This is especially important if you are going back to work while you are still  breastfeeding or when you are not able to be present during feedings. Your lactation consultant can give you guidelines on how long it is safe to store breast milk. A breast pump is a machine that allows you to pump milk from your breast into a sterile bottle. The pumped breast milk can then be stored in a refrigerator or freezer. Some breast pumps are operated by hand, while others use electricity. Ask your lactation consultant which type will work best for you. Breast pumps can be purchased, but some hospitals and breastfeeding support groups lease breast pumps on a monthly basis. A lactation consultant can teach you how to hand express breast milk, if you prefer not to use a pump. Caring for your breasts while you breastfeed Nipples can become dry, cracked, and sore while breastfeeding. The following recommendations can help keep your breasts moisturized and healthy:  Avoid using soap on your nipples.  Wear a supportive bra. Although not required, special nursing bras and tank tops are designed to allow access to your breasts for breastfeeding without taking off your entire bra or top. Avoid wearing underwire-style bras or extremely tight bras.  Air dry your nipples for 3-4minutes after each feeding.  Use only cotton bra pads to absorb leaked breast milk. Leaking of breast milk between feedings is normal.  Use lanolin on your nipples after breastfeeding. Lanolin helps to maintain your skin's normal moisture barrier. If you use pure lanolin, you do not need to wash it off before feeding your baby again. Pure lanolin is not toxic to your baby. You may also hand express a few drops of breast milk and gently massage that milk into your nipples and allow the milk to air dry.  In the first few weeks after giving birth, some women experience extremely full breasts (engorgement). Engorgement can make your breasts feel heavy, warm, and tender to the touch. Engorgement peaks within 3-5 days after you give  birth. The following recommendations can help ease engorgement:  Completely empty your breasts while breastfeeding or pumping. You may want to start by applying warm, moist heat (in the shower or with warm water-soaked hand towels) just before feeding or pumping. This increases circulation and helps the milk flow. If your baby does not completely empty your breasts while breastfeeding, pump any extra milk after he or she is finished.  Wear a snug bra (nursing or regular) or tank top for 1-2 days to signal your body to slightly decrease milk production.  Apply ice packs to your breasts, unless this is too uncomfortable for you.  Make sure that your baby is latched on and positioned properly while breastfeeding.  If engorgement persists after 48 hours of following these recommendations, contact your health care provider or a lactation consultant. Overall health care recommendations while breastfeeding  Eat healthy foods. Alternate between meals and snacks, eating 3 of each per day. Because what you eat affects your breast milk, some of the foods may make your baby more irritable than usual. Avoid eating these foods if you are sure that they are negatively affecting your baby.  Drink milk, fruit juice, and water to satisfy your thirst (about 10 glasses a day).    Rest often, relax, and continue to take your prenatal vitamins to prevent fatigue, stress, and anemia.  Continue breast self-awareness checks.  Avoid chewing and smoking tobacco. Chemicals from cigarettes that pass into breast milk and exposure to secondhand smoke may harm your baby.  Avoid alcohol and drug use, including marijuana. Some medicines that may be harmful to your baby can pass through breast milk. It is important to ask your health care provider before taking any medicine, including all over-the-counter and prescription medicine as well as vitamin and herbal supplements. It is possible to become pregnant while breastfeeding.  If birth control is desired, ask your health care provider about options that will be safe for your baby. Contact a health care provider if:  You feel like you want to stop breastfeeding or have become frustrated with breastfeeding.  You have painful breasts or nipples.  Your nipples are cracked or bleeding.  Your breasts are red, tender, or warm.  You have a swollen area on either breast.  You have a fever or chills.  You have nausea or vomiting.  You have drainage other than breast milk from your nipples.  Your breasts do not become full before feedings by the fifth day after you give birth.  You feel sad and depressed.  Your baby is too sleepy to eat well.  Your baby is having trouble sleeping.  Your baby is wetting less than 3 diapers in a 24-hour period.  Your baby has less than 3 stools in a 24-hour period.  Your baby's skin or the white part of his or her eyes becomes yellow.  Your baby is not gaining weight by 5 days of age. Get help right away if:  Your baby is overly tired (lethargic) and does not want to wake up and feed.  Your baby develops an unexplained fever. This information is not intended to replace advice given to you by your health care provider. Make sure you discuss any questions you have with your health care provider. Document Released: 08/06/2005 Document Revised: 01/18/2016 Document Reviewed: 01/28/2013 [employer] Interactive Patient Education  2017 [employer]  

## 2017-05-06 ENCOUNTER — Encounter (INDEPENDENT_AMBULATORY_CARE_PROVIDER_SITE_OTHER): Payer: Medicaid Other | Admitting: *Deleted

## 2017-05-06 DIAGNOSIS — Z3482 Encounter for supervision of other normal pregnancy, second trimester: Secondary | ICD-10-CM | POA: Diagnosis not present

## 2017-05-06 NOTE — Progress Notes (Signed)
   PRENATAL VISIT NOTE  Subjective:  Julia Kline is a 31 y.o. G2P1001 at [redacted]w[redacted]d being seen today for transferring prenatal care.  Began care in [facility]. Desires to deliver at [facility]. She is currently monitored for the following issues for this high-risk pregnancy and has Supervision of high risk pregnancy, antepartum; Previous cesarean delivery affecting pregnancy, antepartum; Previous gestational diabetes mellitus, antepartum; Hx of preeclampsia, prior pregnancy, currently pregnant; Hx LEEP (loop electrosurgical excision procedure), cervix, pregnancy; and H/O unilateral oophorectomy on her problem list.  Patient reports no complaints.  Contractions: Not present. Vag. Bleeding: None.  Movement: Present. Denies leaking of fluid.   The following portions of the patient's history were reviewed and updated as appropriate: allergies, current medications, past family history, past medical history, past social history, past surgical history and problem list. Problem list updated.  Objective:   Vitals:   05/03/17 1025  BP: 116/83  Pulse: (!) 109  Weight: 237 lb (107.5 kg)    Fetal Status: Fetal Heart Rate (bpm): 158   Movement: Present     General:  Alert, oriented and cooperative. Patient is in no acute distress.  Skin: Skin is warm and dry. No rash noted.   Cardiovascular: Normal heart rate noted  Respiratory: Normal respiratory effort, no problems with respiration noted  Abdomen: Soft, gravid, appropriate for gestational age.  Pain/Pressure: Present     Pelvic: Cervical exam deferred        Extremities: Normal range of motion.  Edema: None  Mental Status:  Normal mood and affect. Normal behavior. Normal judgment and thought content.   Assessment and Plan:  Pregnancy: G2P1001 at [redacted]w[redacted]d  1. Supervision of high risk pregnancy, antepartum Quad screen Needs anatomy scan - [place] MFM OB DETAIL +14 WK; Future - AFP, Serum, Open Spina Bifida  2. Previous gestational diabetes mellitus,  antepartum On insulin last pregnancy Failed her 2 hour pp, but lost some weight no challenge this pregnancy A1C is 6-->not diagnostic reports CBGs are low < 100 when checking--continue few fasting and random CBGs here--consider early 2 hour challenge  3. Hx of preeclampsia, prior pregnancy, currently pregnant Begin Baby ASA - aspirin 81 MG chewable tablet; Chew 1 tablet (81 mg total) by mouth daily.  Dispense: 90 tablet; Refill: 3  4. Previous cesarean delivery affecting pregnancy, antepartum Considering TOLAC vs. RCS--risks discussed will consider.  General obstetric precautions including but not limited to vaginal bleeding, contractions, leaking of fluid and fetal movement were reviewed in detail with the patient. Please refer to After Visit Summary for other counseling recommendations.  Return in 4 weeks (on 05/31/2017).   Reva Bores, MD

## 2017-05-08 ENCOUNTER — Telehealth: Payer: Self-pay | Admitting: *Deleted

## 2017-05-08 DIAGNOSIS — O2242 Hemorrhoids in pregnancy, second trimester: Secondary | ICD-10-CM

## 2017-05-08 LAB — AFP, SERUM, OPEN SPINA BIFIDA
AFP MoM: 0.28
AFP Value: 14.7 ng/mL
Gest. Age on Collection Date: 17.1 weeks
Maternal Age At EDD: 31.9 yr
OSBR Risk 1 IN: 10000
Test Results:: NEGATIVE
Weight: 107 [lb_av]

## 2017-05-08 NOTE — Telephone Encounter (Signed)
Pt called in wanting something for hemorrhoids - has used prepH with no relief.

## 2017-05-09 MED ORDER — HYDROCORTISONE ACETATE 25 MG RE SUPP: 25.0000 mg | Freq: Two times a day (BID) | RECTAL | 1 refills | Status: DC

## 2017-05-09 NOTE — Telephone Encounter (Signed)
Anusol prescribed.

## 2017-05-14 ENCOUNTER — Encounter (HOSPITAL_COMMUNITY): Payer: Self-pay

## 2017-05-14 ENCOUNTER — Other Ambulatory Visit: Payer: Self-pay | Admitting: Family Medicine

## 2017-05-14 ENCOUNTER — Other Ambulatory Visit (HOSPITAL_COMMUNITY): Payer: Self-pay | Admitting: *Deleted

## 2017-05-14 ENCOUNTER — Ambulatory Visit (HOSPITAL_COMMUNITY)
Admission: RE | Admit: 2017-05-14 | Discharge: 2017-05-14 | Disposition: A | Discharge status: Home / Self Care | Payer: Medicaid Other | Source: Ambulatory Visit | Attending: Family Medicine | Admitting: Family Medicine

## 2017-05-14 DIAGNOSIS — Z3A18 18 weeks gestation of pregnancy: Secondary | ICD-10-CM

## 2017-05-14 DIAGNOSIS — O09292 Supervision of pregnancy with other poor reproductive or obstetric history, second trimester: Secondary | ICD-10-CM | POA: Insufficient documentation

## 2017-05-14 DIAGNOSIS — O34211 Maternal care for low transverse scar from previous cesarean delivery: Secondary | ICD-10-CM | POA: Diagnosis not present

## 2017-05-14 DIAGNOSIS — IMO0002 Reserved for concepts with insufficient information to code with codable children: Secondary | ICD-10-CM

## 2017-05-14 DIAGNOSIS — O99212 Obesity complicating pregnancy, second trimester: Secondary | ICD-10-CM | POA: Diagnosis not present

## 2017-05-14 DIAGNOSIS — Z3689 Encounter for other specified antenatal screening: Secondary | ICD-10-CM

## 2017-05-14 DIAGNOSIS — Z0489 Encounter for examination and observation for other specified reasons: Secondary | ICD-10-CM

## 2017-05-14 DIAGNOSIS — O24419 Gestational diabetes mellitus in pregnancy, unspecified control: Secondary | ICD-10-CM | POA: Insufficient documentation

## 2017-05-14 DIAGNOSIS — O0990 Supervision of high risk pregnancy, unspecified, unspecified trimester: Secondary | ICD-10-CM

## 2017-05-14 NOTE — Addendum Note (Signed)
Encounter addended by: Levonne Hubert, RDMS, RVT on: 05/14/2017  1:06 PM<BR>    Actions taken: Imaging Exam ended

## 2017-05-22 ENCOUNTER — Telehealth: Payer: Self-pay

## 2017-05-22 ENCOUNTER — Ambulatory Visit (INDEPENDENT_AMBULATORY_CARE_PROVIDER_SITE_OTHER): Payer: Medicaid Other | Admitting: Family Medicine

## 2017-05-22 VITALS — BP 119/83 | HR 99 | Wt 243.0 lb

## 2017-05-22 DIAGNOSIS — O09299 Supervision of pregnancy with other poor reproductive or obstetric history, unspecified trimester: Secondary | ICD-10-CM

## 2017-05-22 DIAGNOSIS — O24410 Gestational diabetes mellitus in pregnancy, diet controlled: Secondary | ICD-10-CM

## 2017-05-22 DIAGNOSIS — O3440 Maternal care for other abnormalities of cervix, unspecified trimester: Secondary | ICD-10-CM

## 2017-05-22 DIAGNOSIS — O0990 Supervision of high risk pregnancy, unspecified, unspecified trimester: Secondary | ICD-10-CM

## 2017-05-22 DIAGNOSIS — O99210 Obesity complicating pregnancy, unspecified trimester: Secondary | ICD-10-CM | POA: Insufficient documentation

## 2017-05-22 DIAGNOSIS — O34219 Maternal care for unspecified type scar from previous cesarean delivery: Secondary | ICD-10-CM

## 2017-05-22 DIAGNOSIS — Z9889 Other specified postprocedural states: Secondary | ICD-10-CM

## 2017-05-22 NOTE — Progress Notes (Signed)
   PRENATAL VISIT NOTE  Subjective:  Julia Kline is a 31 y.o. G2P1001 at [redacted]w[redacted]d being seen today for ongoing prenatal care.  She is currently monitored for the following issues for this high-risk pregnancy and has Supervision of high risk pregnancy, antepartum; Previous cesarean delivery affecting pregnancy, antepartum; Previous gestational diabetes mellitus, antepartum; Hx of preeclampsia, prior pregnancy, currently pregnant; Hx LEEP (loop electrosurgical excision procedure), cervix, pregnancy; H/O unilateral oophorectomy; Gestational diabetes; and Obesity affecting pregnancy, antepartum on her problem list.  Patient reports no complaints.  Contractions: Not present. Vag. Bleeding: None.  Movement: Present. Denies leaking of fluid.   The following portions of the patient's history were reviewed and updated as appropriate: allergies, current medications, past family history, past medical history, past social history, past surgical history and problem list. Problem list updated.  Objective:   Vitals:   05/22/17 1615  BP: 119/83  Pulse: 99  Weight: 243 lb (110.2 kg)   -2 lb (-0.907 kg)  Fetal Status: Fetal Heart Rate (bpm): 150 Fundal Height: 19 cm Movement: Present     General:  Alert, oriented and cooperative. Patient is in no acute distress.  Skin: Skin is warm and dry. No rash noted.   Cardiovascular: Normal heart rate noted  Respiratory: Normal respiratory effort, no problems with respiration noted  Abdomen: Soft, gravid, appropriate for gestational age.  Pain/Pressure: Absent     Pelvic: Cervical exam deferred        Extremities: Normal range of motion.  Edema: Trace  Mental Status:  Normal mood and affect. Normal behavior. Normal judgment and thought content.   Assessment and Plan:  Pregnancy: G2P1001 at [redacted]w[redacted]d  1. Previous cesarean delivery affecting pregnancy, antepartum TOLAC discussed  2. Supervision of high risk pregnancy, antepartum UTD, transferred care at  17wks  3. Diet controlled gestational diabetes mellitus (GDM), antepartum HA1C= 6.0% Needs early glucola- plan to do at 10/11  4. Hx of preeclampsia, prior pregnancy, currently pregnant Not taking ASA, counseled to start ASAP  5. History of loop electrosurgical excision procedure (LEEP) of cervix affecting pregnancy, antepartum   Preterm labor symptoms and general obstetric precautions including but not limited to vaginal bleeding, contractions, leaking of fluid and fetal movement were reviewed in detail with the patient. Please refer to After Visit Summary for other counseling recommendations.  Return in about 1 week (around 05/29/2017).  Future Appointments Date Time Provider Department Center  05/30/2017 10:00 AM Allie Bossier, MD [facility]  06/10/2017 9:30 AM [facility] [place] 1 [facility]     Federico Flake, MD

## 2017-05-29 NOTE — Telephone Encounter (Signed)
PLEASE NOTE: All timestamps contained within this report are represented as [place] Standard Time. CONFIDENTIALTY NOTICE: This fax transmission is intended only for the addressee. It contains information that is legally privileged, confidential or otherwise protected from use or disclosure. If you are not the intended recipient, you are strictly prohibited from reviewing, disclosing, copying using or disseminating any of this information or taking any action in reliance on or regarding this information. If you have received this fax in error, please notify us immediately by telephone so that we can arrange for its return to [place]. Phone: [phone], Toll-Free: [phone], Fax: [phone] Page: 1 of 2 Call Id: [id number] [facility] Day - Client TELEPHONE ADVICE RECORD [facility] Patient Name: Julia Kline Gender: Female DOB: 1986-05-04 Age: 31 Y 7 M 8 D Return Phone Number: [phone] (Primary), [phone] (Secondary) Address: City/State/Zip: [place] [address] Client [facility] Day - Client Client Site [facility] [place] - Day Physician AA - PHYSICIAN, NOT LISTED- MD Contact Type Call Who Is Calling Patient / Member / Family / Caregiver Call Type Triage / Clinical Relationship To Patient Self Return Phone Number [phone] (Secondary) Chief Complaint Dizziness Reason for Call Symptomatic / Request for Health Information Initial Comment Caller states she is [redacted] weeks pregnant and for the past 2 days she has been getting dizzy spells. Doctor Shawnie Pons not listed. Appointment Disposition EMR Appointment Not Necessary Info pasted into Epic No GOTO Facility Not Listed Center for [facility], Dr Shawnie Pons Translation No Nurse Assessment Nurse: Annye English, RN, Angelique Blonder Date/Time (Eastern Time): 05/22/2017 2:02:08 PM Confirm and document reason for call. If symptomatic, describe symptoms. ---Caller states she is [redacted] weeks  pregnant and for the past 2 days she has been getting dizzy spells. Dr Shawnie Pons is her OB/GYN and she verified the number dialed as the number directly to this MDO. Does the patient have any new or worsening symptoms? ---Yes Will a triage be completed? ---Yes Related visit to physician within the last 2 weeks? ---Yes Does the PT have any chronic conditions? (i.e. diabetes, asthma, etc.) ---No Is the patient pregnant or possibly pregnant? (Ask all females between the ages of 46-55) ---Yes How many weeks gestation? ---19.6 wks What is the estimated delivery date? ---2017-10-10 Total number of pregnancies including current? ---2 Number of live births? ---1 Have you felt decreased fetal movement? ---No Is this a behavioral health or substance abuse call? ---No PLEASE NOTE: All timestamps contained within this report are represented as [place] Standard Time. CONFIDENTIALTY NOTICE: This fax transmission is intended only for the addressee. It contains information that is legally privileged, confidential or otherwise protected from use or disclosure. If you are not the intended recipient, you are strictly prohibited from reviewing, disclosing, copying using or disseminating any of this information or taking any action in reliance on or regarding this information. If you have received this fax in error, please notify us immediately by telephone so that we can arrange for its return to [place]. Phone: [phone], Toll-Free: [phone], Fax: [phone] Page: 2 of 2 Call Id: [id number] Guidelines Guideline Title Affirmed Question Affirmed Notes Nurse Date/Time Lamount Cohen Time) Dizziness - Lightheadedness [1] MODERATE dizziness (e.g., interferes with normal activities) AND [2] has NOT been evaluated by physician for this (Exception: dizziness caused by heat exposure, sudden standing, or poor fluid intake) Carmon, RN, Angelique Blonder 05/22/2017 2:07:17 PM Disp. Time Lamount Cohen Time) Disposition Final  User 05/22/2017 1:59:24 PM Attempt made - message left Carmon, RN, Angelique Blonder 05/22/2017  2:14:45 PM Call Completed Stefano Gaul 05/22/2017 2:13:47 PM See Physician within 24 Hours Yes Carmon, RN, Leighton Ruff Disagree/Comply Comply Caller Understands Yes PreDisposition Call Doctor Care Advice Given Per Guideline SEE PHYSICIAN WITHIN 24 HOURS: FLUIDS: Drink several glasses of fruit juice, other clear fluids or water. This will improve hydration and blood glucose. If the weather is hot, make sure the fluids are cold. REST: Lie down with feet elevated for 1 hour. This will improve circulation and increase blood flow to the brain. CALL BACK IF: * Passes out (faints) * You become worse. CARE ADVICE given per Dizziness (Adult) guideline. Comments User: Greggory Stallion, RN Date/Time Lamount Cohen Time): 05/22/2017 2:13:06 PM Per Paulino Rily, Dr Shawnie Pons, OB/GYN is located at [facility] for [facility], [address] [place], [place], [phone] Advised will trans pt to the correct MDO as above for appt to be seen within 24 hrs. User: Greggory Stallion, RN Date/Time Lamount Cohen Time): 05/22/2017 2:14:34 PM Will not copy/paste in to [facility] as this is not a pt of this office. Referrals GO TO FACILITY OTHER - SPECIFY

## 2017-05-29 NOTE — Telephone Encounter (Signed)
I cannot see the conversation that was routed to me. I saw this patient at the [facility].

## 2017-05-30 ENCOUNTER — Other Ambulatory Visit (INDEPENDENT_AMBULATORY_CARE_PROVIDER_SITE_OTHER): Payer: Medicaid Other

## 2017-05-30 ENCOUNTER — Ambulatory Visit (INDEPENDENT_AMBULATORY_CARE_PROVIDER_SITE_OTHER): Payer: Medicaid Other | Admitting: Obstetrics & Gynecology

## 2017-05-30 DIAGNOSIS — Z23 Encounter for immunization: Secondary | ICD-10-CM

## 2017-05-30 DIAGNOSIS — O0992 Supervision of high risk pregnancy, unspecified, second trimester: Secondary | ICD-10-CM

## 2017-05-30 DIAGNOSIS — O0990 Supervision of high risk pregnancy, unspecified, unspecified trimester: Secondary | ICD-10-CM

## 2017-05-30 DIAGNOSIS — O24419 Gestational diabetes mellitus in pregnancy, unspecified control: Secondary | ICD-10-CM

## 2017-05-30 NOTE — Progress Notes (Signed)
   PRENATAL VISIT NOTE  Subjective:  Julia Kline is a 31 y.o. G2P1001 at [redacted]w[redacted]d being seen today for ongoing prenatal care.  She is currently monitored for the following issues for this low-risk pregnancy and has Supervision of high risk pregnancy, antepartum; Previous cesarean delivery affecting pregnancy, antepartum; Previous gestational diabetes mellitus, antepartum; Hx of preeclampsia, prior pregnancy, currently pregnant; Hx LEEP (loop electrosurgical excision procedure), cervix, pregnancy; H/O unilateral oophorectomy; Gestational diabetes; and Obesity affecting pregnancy, antepartum on her problem list.  Patient reports no complaints.  Contractions: Not present.  .  Movement: Present. Denies leaking of fluid.   The following portions of the patient's history were reviewed and updated as appropriate: allergies, current medications, past family history, past medical history, past social history, past surgical history and problem list. Problem list updated.  Objective:   Vitals:   05/30/17 0838  BP: 122/83  Pulse: 98  Weight: 245 lb 9.6 oz (111.4 kg)    Fetal Status: Fetal Heart Rate (bpm): 147   Movement: Present     General:  Alert, oriented and cooperative. Patient is in no acute distress.  Skin: Skin is warm and dry. No rash noted.   Cardiovascular: Normal heart rate noted  Respiratory: Normal respiratory effort, no problems with respiration noted  Abdomen: Soft, gravid, appropriate for gestational age.  Pain/Pressure: Absent     Pelvic: Cervical exam deferred        Extremities: Normal range of motion.  Edema: Trace  Mental Status:  Normal mood and affect. Normal behavior. Normal judgment and thought content.   Assessment and Plan:  Pregnancy: G2P1001 at [redacted]w[redacted]d  1. Supervision of high risk pregnancy, antepartum  Preterm labor symptoms and general obstetric precautions including but not limited to vaginal bleeding, contractions, leaking of fluid and fetal movement were  reviewed in detail with the patient. Please refer to After Visit Summary for other counseling recommendations.  No Follow-up on file.   Allie Bossier, MD

## 2017-05-31 ENCOUNTER — Encounter: Payer: Self-pay | Admitting: Obstetrics & Gynecology

## 2017-05-31 ENCOUNTER — Encounter: Payer: Medicaid Other | Admitting: Obstetrics and Gynecology

## 2017-05-31 ENCOUNTER — Telehealth: Payer: Self-pay | Admitting: *Deleted

## 2017-05-31 DIAGNOSIS — O24419 Gestational diabetes mellitus in pregnancy, unspecified control: Secondary | ICD-10-CM

## 2017-05-31 LAB — GLUCOSE TOLERANCE, 2 HOURS W/ 1HR
Glucose, 1 hour: 182 mg/dL — ABNORMAL HIGH (ref 65–179)
Glucose, 2 hour: 166 mg/dL — ABNORMAL HIGH (ref 65–152)
Glucose, Fasting: 90 mg/dL (ref 65–91)

## 2017-05-31 MED ORDER — ACCU-CHEK FASTCLIX LANCETS MISC: 1.0000 [IU] | Freq: Four times a day (QID) | 12 refills | Status: DC

## 2017-05-31 MED ORDER — ACCU-CHEK NANO SMARTVIEW W/DEVICE KIT: 1.0000 | PACK | 0 refills | Status: DC

## 2017-05-31 MED ORDER — GLUCOSE BLOOD VI STRP: ORAL_STRIP | 12 refills | Status: DC

## 2017-05-31 NOTE — Telephone Encounter (Signed)
Spoke to pt adv about results and N&D to be calling to make appt

## 2017-06-03 ENCOUNTER — Other Ambulatory Visit: Payer: Self-pay | Admitting: Obstetrics & Gynecology

## 2017-06-03 DIAGNOSIS — O24419 Gestational diabetes mellitus in pregnancy, unspecified control: Secondary | ICD-10-CM

## 2017-06-03 MED ORDER — ACCU-CHEK NANO SMARTVIEW W/DEVICE KIT: 1.0000 | PACK | 0 refills | Status: DC

## 2017-06-05 ENCOUNTER — Encounter: Payer: Medicaid Other | Attending: Obstetrics & Gynecology | Admitting: Registered"

## 2017-06-05 DIAGNOSIS — R7309 Other abnormal glucose: Secondary | ICD-10-CM

## 2017-06-05 DIAGNOSIS — O24419 Gestational diabetes mellitus in pregnancy, unspecified control: Secondary | ICD-10-CM | POA: Insufficient documentation

## 2017-06-06 ENCOUNTER — Encounter: Payer: Self-pay | Admitting: Registered"

## 2017-06-06 ENCOUNTER — Telehealth: Payer: Self-pay

## 2017-06-06 NOTE — Telephone Encounter (Signed)
[facility] called with questions about diabetic supplies; advised to contact Dr [place]irl [place] office;pharmacist voiced understanding.

## 2017-06-06 NOTE — Progress Notes (Signed)
Patient was seen on 06/05/2017 for Gestational Diabetes self-management class at [facility]. The following learning objectives were met by the patient during this course:   States the definition of Gestational Diabetes  States why dietary management is important in controlling blood glucose  Describes the effects each nutrient has on blood glucose levels  Demonstrates ability to create a balanced meal plan  Demonstrates carbohydrate counting   States when to check blood glucose levels  Demonstrates proper blood glucose monitoring techniques  States the effect of stress and exercise on blood glucose levels  States the importance of limiting caffeine and abstaining from alcohol and smoking  Blood glucose monitor given: Accu-Chek Guide Lot #  [id number] Exp: 07/05/18 Blood glucose reading: 87  Patient instructed to monitor glucose levels: FBS: 60 - <95 1 hour: <140 2 hour: <120  Patient received handouts:  Nutrition Diabetes and Pregnancy  Carbohydrate Counting List  Patient will be seen for follow-up as needed.

## 2017-06-10 ENCOUNTER — Other Ambulatory Visit (HOSPITAL_COMMUNITY): Payer: Self-pay | Admitting: Maternal and Fetal Medicine

## 2017-06-10 ENCOUNTER — Other Ambulatory Visit (HOSPITAL_COMMUNITY): Payer: Self-pay | Admitting: *Deleted

## 2017-06-10 ENCOUNTER — Ambulatory Visit (HOSPITAL_COMMUNITY)
Admission: RE | Admit: 2017-06-10 | Discharge: 2017-06-10 | Disposition: A | Discharge status: Home / Self Care | Payer: Medicaid Other | Source: Ambulatory Visit | Attending: Maternal and Fetal Medicine | Admitting: Maternal and Fetal Medicine

## 2017-06-10 ENCOUNTER — Encounter (HOSPITAL_COMMUNITY): Payer: Self-pay

## 2017-06-10 DIAGNOSIS — IMO0002 Reserved for concepts with insufficient information to code with codable children: Secondary | ICD-10-CM

## 2017-06-10 DIAGNOSIS — Z362 Encounter for other antenatal screening follow-up: Secondary | ICD-10-CM | POA: Diagnosis not present

## 2017-06-10 DIAGNOSIS — O99212 Obesity complicating pregnancy, second trimester: Secondary | ICD-10-CM

## 2017-06-10 DIAGNOSIS — O09292 Supervision of pregnancy with other poor reproductive or obstetric history, second trimester: Secondary | ICD-10-CM | POA: Insufficient documentation

## 2017-06-10 DIAGNOSIS — O09892 Supervision of other high risk pregnancies, second trimester: Secondary | ICD-10-CM | POA: Diagnosis not present

## 2017-06-10 DIAGNOSIS — Z0489 Encounter for examination and observation for other specified reasons: Secondary | ICD-10-CM

## 2017-06-10 DIAGNOSIS — Z3A22 22 weeks gestation of pregnancy: Secondary | ICD-10-CM | POA: Insufficient documentation

## 2017-06-10 DIAGNOSIS — O34219 Maternal care for unspecified type scar from previous cesarean delivery: Secondary | ICD-10-CM | POA: Insufficient documentation

## 2017-06-10 DIAGNOSIS — O24410 Gestational diabetes mellitus in pregnancy, diet controlled: Secondary | ICD-10-CM | POA: Diagnosis not present

## 2017-06-11 ENCOUNTER — Ambulatory Visit (HOSPITAL_COMMUNITY): Payer: Medicaid Other

## 2017-06-21 ENCOUNTER — Ambulatory Visit (INDEPENDENT_AMBULATORY_CARE_PROVIDER_SITE_OTHER): Payer: Medicaid Other | Admitting: Obstetrics & Gynecology

## 2017-06-21 VITALS — BP 109/73 | HR 106 | Wt 246.0 lb

## 2017-06-21 DIAGNOSIS — O0992 Supervision of high risk pregnancy, unspecified, second trimester: Secondary | ICD-10-CM

## 2017-06-21 DIAGNOSIS — O34219 Maternal care for unspecified type scar from previous cesarean delivery: Secondary | ICD-10-CM

## 2017-06-21 DIAGNOSIS — O09299 Supervision of pregnancy with other poor reproductive or obstetric history, unspecified trimester: Secondary | ICD-10-CM

## 2017-06-21 DIAGNOSIS — O24414 Gestational diabetes mellitus in pregnancy, insulin controlled: Secondary | ICD-10-CM

## 2017-06-21 DIAGNOSIS — O09292 Supervision of pregnancy with other poor reproductive or obstetric history, second trimester: Secondary | ICD-10-CM

## 2017-06-21 DIAGNOSIS — O0990 Supervision of high risk pregnancy, unspecified, unspecified trimester: Secondary | ICD-10-CM

## 2017-06-21 NOTE — Progress Notes (Signed)
   PRENATAL VISIT NOTE  Subjective:  Julia Kline is a 31 y.o. G2P1001 at 1947w1d being seen today for ongoing prenatal care.  She is currently monitored for the following issues for this high-risk pregnancy and has Supervision of high risk pregnancy, antepartum; Previous cesarean delivery affecting pregnancy, antepartum; Previous gestational diabetes mellitus, antepartum; Hx of preeclampsia, prior pregnancy, currently pregnant; Hx LEEP (loop electrosurgical excision procedure), cervix, pregnancy; H/O unilateral oophorectomy; GDM (gestational diabetes mellitus); and Obesity affecting pregnancy, antepartum on her problem list.  Patient reports no complaints.  Contractions: Not present. Vag. Bleeding: None.  Movement: Present. Denies leaking of fluid.   The following portions of the patient's history were reviewed and updated as appropriate: allergies, current medications, past family history, past medical history, past social history, past surgical history and problem list. Problem list updated.  Objective:   Vitals:   06/21/17 0935  BP: 109/73  Pulse: (!) 106  Weight: 246 lb (111.6 kg)    Fetal Status: Fetal Heart Rate (bpm): 154   Movement: Present     General:  Alert, oriented and cooperative. Patient is in no acute distress.  Skin: Skin is warm and dry. No rash noted.   Cardiovascular: Normal heart rate noted  Respiratory: Normal respiratory effort, no problems with respiration noted  Abdomen: Soft, gravid, appropriate for gestational age.  Pain/Pressure: Absent     Pelvic: Cervical exam deferred        Extremities: Normal range of motion.  Edema: Trace  Mental Status:  Normal mood and affect. Normal behavior. Normal judgment and thought content.   Assessment and Plan:  Pregnancy: G2P1001 at 7847w1d  1. gestational diabetes mellitus (GDM) in second trimester - on no meds - great sugars   2. Hx of preeclampsia, prior pregnancy, currently pregnant - on baby asa daily until 36  weeks  3. Previous cesarean delivery affecting pregnancy, antepartum - would like a TOLAC  4. Supervision of high risk pregnancy, antepartum - follow up MFM u/s q 4 weeks already scheduled  Preterm labor symptoms and general obstetric precautions including but not limited to vaginal bleeding, contractions, leaking of fluid and fetal movement were reviewed in detail with the patient. Please refer to After Visit Summary for other counseling recommendations.  No Follow-up on file.   Allie BossierMyra C Camia Dipinto, MD

## 2017-07-10 ENCOUNTER — Encounter (HOSPITAL_COMMUNITY): Payer: Self-pay

## 2017-07-10 ENCOUNTER — Other Ambulatory Visit (HOSPITAL_COMMUNITY): Payer: Self-pay | Admitting: Maternal and Fetal Medicine

## 2017-07-10 ENCOUNTER — Ambulatory Visit (HOSPITAL_COMMUNITY)
Admission: RE | Admit: 2017-07-10 | Discharge: 2017-07-10 | Disposition: A | Discharge status: Home / Self Care | Payer: Medicaid Other | Source: Ambulatory Visit | Attending: Obstetrics & Gynecology | Admitting: Obstetrics & Gynecology

## 2017-07-10 DIAGNOSIS — O99212 Obesity complicating pregnancy, second trimester: Secondary | ICD-10-CM

## 2017-07-10 DIAGNOSIS — O34219 Maternal care for unspecified type scar from previous cesarean delivery: Secondary | ICD-10-CM

## 2017-07-10 DIAGNOSIS — O99210 Obesity complicating pregnancy, unspecified trimester: Secondary | ICD-10-CM

## 2017-07-10 DIAGNOSIS — Z9889 Other specified postprocedural states: Secondary | ICD-10-CM

## 2017-07-10 DIAGNOSIS — Z3A26 26 weeks gestation of pregnancy: Secondary | ICD-10-CM

## 2017-07-10 DIAGNOSIS — O24410 Gestational diabetes mellitus in pregnancy, diet controlled: Secondary | ICD-10-CM

## 2017-07-10 DIAGNOSIS — O09299 Supervision of pregnancy with other poor reproductive or obstetric history, unspecified trimester: Secondary | ICD-10-CM | POA: Diagnosis present

## 2017-07-10 DIAGNOSIS — O3440 Maternal care for other abnormalities of cervix, unspecified trimester: Secondary | ICD-10-CM

## 2017-07-10 DIAGNOSIS — O0990 Supervision of high risk pregnancy, unspecified, unspecified trimester: Secondary | ICD-10-CM

## 2017-07-10 DIAGNOSIS — Z8632 Personal history of gestational diabetes: Secondary | ICD-10-CM

## 2017-07-19 ENCOUNTER — Ambulatory Visit (INDEPENDENT_AMBULATORY_CARE_PROVIDER_SITE_OTHER): Payer: Medicaid Other | Admitting: Obstetrics & Gynecology

## 2017-07-19 ENCOUNTER — Encounter: Payer: Self-pay | Admitting: Obstetrics & Gynecology

## 2017-07-19 VITALS — BP 125/82 | HR 103 | Wt 247.0 lb

## 2017-07-19 DIAGNOSIS — O24410 Gestational diabetes mellitus in pregnancy, diet controlled: Secondary | ICD-10-CM

## 2017-07-19 DIAGNOSIS — O09292 Supervision of pregnancy with other poor reproductive or obstetric history, second trimester: Secondary | ICD-10-CM

## 2017-07-19 DIAGNOSIS — O0990 Supervision of high risk pregnancy, unspecified, unspecified trimester: Secondary | ICD-10-CM

## 2017-07-19 DIAGNOSIS — O0992 Supervision of high risk pregnancy, unspecified, second trimester: Secondary | ICD-10-CM

## 2017-07-19 DIAGNOSIS — O09299 Supervision of pregnancy with other poor reproductive or obstetric history, unspecified trimester: Secondary | ICD-10-CM

## 2017-07-19 NOTE — Patient Instructions (Signed)
Return to clinic for any scheduled appointments or obstetric concerns, or go to [facility] for evaluation  

## 2017-07-19 NOTE — Progress Notes (Signed)
   PRENATAL VISIT NOTE  Subjective:  Julia Kline is a 31 y.o. G2P1001 at 2855w1d being seen today for ongoing prenatal care.  She is currently monitored for the following issues for this high-risk pregnancy and has Supervision of high risk pregnancy, antepartum; Previous cesarean delivery affecting pregnancy, antepartum; Previous gestational diabetes mellitus, antepartum; Hx of preeclampsia, prior pregnancy, currently pregnant; Hx LEEP (loop electrosurgical excision procedure), cervix, pregnancy; H/O unilateral oophorectomy; GDM (gestational diabetes mellitus); and Obesity affecting pregnancy, antepartum on their problem list.  Patient reports no complaints.  Contractions: Not present. Vag. Bleeding: None.  Movement: Present. Denies leaking of fluid.   The following portions of the patient's history were reviewed and updated as appropriate: allergies, current medications, past family history, past medical history, past social history, past surgical history and problem list. Problem list updated.  Objective:   Vitals:   07/19/17 1102  BP: 125/82  Pulse: (!) 103  Weight: 247 lb (112 kg)    Fetal Status: Fetal Heart Rate (bpm): 137   Movement: Present     General:  Alert, oriented and cooperative. Patient is in no acute distress.  Skin: Skin is warm and dry. No rash noted.   Cardiovascular: Normal heart rate noted  Respiratory: Normal respiratory effort, no problems with respiration noted  Abdomen: Soft, gravid, appropriate for gestational age.  Pain/Pressure: Present     Pelvic: Cervical exam deferred        Extremities: Normal range of motion.  Edema: Trace  Mental Status:  Normal mood and affect. Normal behavior. Normal judgment and thought content.   Assessment and Plan:  Pregnancy: G2P1001 at 355w1d  1. Diet controlled gestational diabetes mellitus (GDM), antepartum Reports stable BS, forgot log book today.  Scheduled for growth scan in 07/2017.  Continue diet and exercise. -  Hemoglobin A1c  2. Hx of preeclampsia, prior pregnancy, currently pregnant Stable BP, continue ASA.  3. Supervision of high risk pregnancy, antepartum Third trimester labs - CBC - RPR - HIV antibody Preterm labor symptoms and general obstetric precautions including but not limited to vaginal bleeding, contractions, leaking of fluid and fetal movement were reviewed in detail with the patient. Please refer to After Visit Summary for other counseling recommendations.  Return in about 2 weeks (around 08/02/2017) for OB Visit.   Jaynie CollinsUgonna Nevaya Nagele, MD

## 2017-07-20 LAB — CBC
Hematocrit: 31 % — ABNORMAL LOW (ref 34.0–46.6)
Hemoglobin: 10.1 g/dL — ABNORMAL LOW (ref 11.1–15.9)
MCH: 22.4 pg — ABNORMAL LOW (ref 26.6–33.0)
MCHC: 32.6 g/dL (ref 31.5–35.7)
MCV: 69 fL — ABNORMAL LOW (ref 79–97)
Platelets: 210 10*3/uL (ref 150–379)
RBC: 4.5 x10E6/uL (ref 3.77–5.28)
RDW: 15.9 % — ABNORMAL HIGH (ref 12.3–15.4)
WBC: 8.6 10*3/uL (ref 3.4–10.8)

## 2017-07-20 LAB — HEMOGLOBIN A1C
Est. average glucose Bld gHb Est-mCnc: 123 mg/dL
Hgb A1c MFr Bld: 5.9 % — ABNORMAL HIGH (ref 4.8–5.6)

## 2017-07-20 LAB — RPR: RPR Ser Ql: NONREACTIVE

## 2017-07-20 LAB — HIV ANTIBODY (ROUTINE TESTING W REFLEX): HIV Screen 4th Generation wRfx: NONREACTIVE

## 2017-07-31 ENCOUNTER — Telehealth: Payer: Self-pay | Admitting: *Deleted

## 2017-07-31 NOTE — Telephone Encounter (Signed)
Called pt to adv no CBG readings in BRX. Need to find out how she is keeping up readings. LM for her to rtn call.

## 2017-08-01 ENCOUNTER — Ambulatory Visit (INDEPENDENT_AMBULATORY_CARE_PROVIDER_SITE_OTHER): Payer: Medicaid Other | Admitting: Obstetrics and Gynecology

## 2017-08-01 VITALS — BP 114/74 | HR 80 | Wt 248.2 lb

## 2017-08-01 DIAGNOSIS — O09299 Supervision of pregnancy with other poor reproductive or obstetric history, unspecified trimester: Secondary | ICD-10-CM

## 2017-08-01 DIAGNOSIS — O24419 Gestational diabetes mellitus in pregnancy, unspecified control: Secondary | ICD-10-CM

## 2017-08-01 DIAGNOSIS — O34219 Maternal care for unspecified type scar from previous cesarean delivery: Secondary | ICD-10-CM

## 2017-08-01 DIAGNOSIS — O0990 Supervision of high risk pregnancy, unspecified, unspecified trimester: Secondary | ICD-10-CM

## 2017-08-01 DIAGNOSIS — O99210 Obesity complicating pregnancy, unspecified trimester: Secondary | ICD-10-CM

## 2017-08-01 MED ORDER — GLYBURIDE 2.5 MG PO TABS: 1.2500 mg | ORAL_TABLET | Freq: Every day | ORAL | 3 refills | Status: DC

## 2017-08-01 NOTE — Progress Notes (Signed)
Prenatal Visit Note Date: 08/01/2017 Clinic: [facility]  Subjective:  Julia Kline is a 31 y.o. G2P1001 at 4567w0d being seen today for ongoing prenatal care.  She is currently monitored for the following issues for this high-risk pregnancy and has Supervision of high risk pregnancy, antepartum; Previous cesarean delivery affecting pregnancy, antepartum; Previous gestational diabetes mellitus, antepartum; Hx of preeclampsia, prior pregnancy, currently pregnant; Hx LEEP (loop electrosurgical excision procedure), cervix, pregnancy; H/O unilateral oophorectomy; GDM, class A2; and Obesity affecting pregnancy, antepartum on their problem list.  Patient reports no complaints.   Contractions: Irregular. Vag. Bleeding: None.  Movement: Present. Denies leaking of fluid.   The following portions of the patient's history were reviewed and updated as appropriate: allergies, current medications, past family history, past medical history, past social history, past surgical history and problem list. Problem list updated.  Objective:   Vitals:   08/01/17 0951  BP: 114/74  Pulse: 80  Weight: 248 lb 3.2 oz (112.6 kg)    Fetal Status: Fetal Heart Rate (bpm): 145   Movement: Present     General:  Alert, oriented and cooperative. Patient is in no acute distress.  Skin: Skin is warm and dry. No rash noted.   Cardiovascular: Normal heart rate noted  Respiratory: Normal respiratory effort, no problems with respiration noted  Abdomen: Soft, gravid, appropriate for gestational age. Pain/Pressure: Present     Pelvic:  Cervical exam deferred        Extremities: Normal range of motion.  Edema: Trace  Mental Status: Normal mood and affect. Normal behavior. Normal judgment and thought content.   Urinalysis:      Assessment and Plan:  Pregnancy: G2P1001 at 3767w0d  1. Supervision of high risk pregnancy, antepartum Routine care. D/w pt re: BC nv  2. GDM, class A2 Patient consistently  checking am fastings which are elevated in the high 90s to 100s. So will start glyburide 1.25 qhs. Encouraged pt to check more during the day. The 2h lunch ones she does check are normal.  F/u u/s on 12/17. Start 2x/week testing at 32wks.   3. Previous cesarean delivery affecting pregnancy, antepartum vbac form signed and pt desires tolac for now. Prior op note reviewed with here  4. Obesity affecting pregnancy, antepartum  5. Hx of preeclampsia, prior pregnancy, currently pregnant Continue baby asa  Preterm labor symptoms and general obstetric precautions including but not limited to vaginal bleeding, contractions, leaking of fluid and fetal movement were reviewed in detail with the patient. Please refer to After Visit Summary for other counseling recommendations.  Return in about 1 week (around 08/08/2017) for rob.   Cowgill BingPickens, Julia Shaw, MD

## 2017-08-05 ENCOUNTER — Other Ambulatory Visit (HOSPITAL_COMMUNITY): Payer: Self-pay | Admitting: Maternal and Fetal Medicine

## 2017-08-05 ENCOUNTER — Ambulatory Visit (HOSPITAL_COMMUNITY)
Admission: RE | Admit: 2017-08-05 | Discharge: 2017-08-05 | Disposition: A | Discharge status: Home / Self Care | Payer: Medicaid Other | Source: Ambulatory Visit | Attending: Obstetrics & Gynecology | Admitting: Obstetrics & Gynecology

## 2017-08-05 ENCOUNTER — Encounter (HOSPITAL_COMMUNITY): Payer: Self-pay

## 2017-08-05 ENCOUNTER — Telehealth: Payer: Self-pay

## 2017-08-05 DIAGNOSIS — Z6835 Body mass index (BMI) 35.0-35.9, adult: Secondary | ICD-10-CM | POA: Diagnosis not present

## 2017-08-05 DIAGNOSIS — Z3A30 30 weeks gestation of pregnancy: Secondary | ICD-10-CM

## 2017-08-05 DIAGNOSIS — O24415 Gestational diabetes mellitus in pregnancy, controlled by oral hypoglycemic drugs: Secondary | ICD-10-CM | POA: Insufficient documentation

## 2017-08-05 DIAGNOSIS — O09299 Supervision of pregnancy with other poor reproductive or obstetric history, unspecified trimester: Secondary | ICD-10-CM

## 2017-08-05 DIAGNOSIS — O34219 Maternal care for unspecified type scar from previous cesarean delivery: Secondary | ICD-10-CM | POA: Diagnosis not present

## 2017-08-05 DIAGNOSIS — O99213 Obesity complicating pregnancy, third trimester: Secondary | ICD-10-CM | POA: Insufficient documentation

## 2017-08-05 DIAGNOSIS — O24410 Gestational diabetes mellitus in pregnancy, diet controlled: Secondary | ICD-10-CM

## 2017-08-05 NOTE — Telephone Encounter (Signed)
Received called from baby scripts regarding patient high blood sugar reading of 177. There was no answer I have left a message for patient to call us back.

## 2017-08-06 ENCOUNTER — Other Ambulatory Visit (HOSPITAL_COMMUNITY): Payer: Self-pay | Admitting: *Deleted

## 2017-08-06 ENCOUNTER — Other Ambulatory Visit: Payer: Self-pay | Admitting: *Deleted

## 2017-08-06 DIAGNOSIS — O24419 Gestational diabetes mellitus in pregnancy, unspecified control: Secondary | ICD-10-CM

## 2017-08-06 DIAGNOSIS — O24415 Gestational diabetes mellitus in pregnancy, controlled by oral hypoglycemic drugs: Secondary | ICD-10-CM

## 2017-08-14 ENCOUNTER — Encounter: Payer: Self-pay | Admitting: Obstetrics and Gynecology

## 2017-08-14 ENCOUNTER — Ambulatory Visit (INDEPENDENT_AMBULATORY_CARE_PROVIDER_SITE_OTHER): Payer: Medicaid Other | Admitting: Obstetrics and Gynecology

## 2017-08-14 VITALS — BP 90/60 | HR 97 | Wt 247.2 lb

## 2017-08-14 DIAGNOSIS — O0993 Supervision of high risk pregnancy, unspecified, third trimester: Secondary | ICD-10-CM

## 2017-08-14 DIAGNOSIS — O34219 Maternal care for unspecified type scar from previous cesarean delivery: Secondary | ICD-10-CM

## 2017-08-14 DIAGNOSIS — O24419 Gestational diabetes mellitus in pregnancy, unspecified control: Secondary | ICD-10-CM

## 2017-08-14 DIAGNOSIS — O99210 Obesity complicating pregnancy, unspecified trimester: Secondary | ICD-10-CM | POA: Insufficient documentation

## 2017-08-14 DIAGNOSIS — O0990 Supervision of high risk pregnancy, unspecified, unspecified trimester: Secondary | ICD-10-CM

## 2017-08-14 DIAGNOSIS — E669 Obesity, unspecified: Secondary | ICD-10-CM

## 2017-08-14 DIAGNOSIS — O99213 Obesity complicating pregnancy, third trimester: Secondary | ICD-10-CM

## 2017-08-14 MED ORDER — GLYBURIDE 2.5 MG PO TABS: ORAL_TABLET | ORAL | 3 refills | Status: DC

## 2017-08-14 NOTE — Progress Notes (Signed)
Prenatal Visit Note Date: 08/14/2017 Clinic: [facility]  Subjective:  Julia Kline is a 31 y.o. G2P1001 at 10139w6d being seen today for ongoing prenatal care.  She is currently monitored for the following issues for this high-risk pregnancy and has Supervision of high risk pregnancy, antepartum; Previous cesarean delivery affecting pregnancy, antepartum; Previous gestational diabetes mellitus, antepartum; Hx of preeclampsia, prior pregnancy, currently pregnant; Hx LEEP (loop electrosurgical excision procedure), cervix, pregnancy; H/O unilateral oophorectomy; GDM, class A2; Obesity affecting pregnancy, antepartum; and Obesity (BMI 30-39.9) on their problem list.  Patient reports no complaints.   Contractions: Not present. Vag. Bleeding: None.  Movement: Present. Denies leaking of fluid.   The following portions of the patient's history were reviewed and updated as appropriate: allergies, current medications, past family history, past medical history, past social history, past surgical history and problem list. Problem list updated.  Objective:   Vitals:   08/14/17 1049  BP: 90/60  Pulse: 97  Weight: 247 lb 3.2 oz (112.1 kg)    Fetal Status: Fetal Heart Rate (bpm): 151   Movement: Present     General:  Alert, oriented and cooperative. Patient is in no acute distress.  Skin: Skin is warm and dry. No rash noted.   Cardiovascular: Normal heart rate noted  Respiratory: Normal respiratory effort, no problems with respiration noted  Abdomen: Soft, gravid, appropriate for gestational age. Pain/Pressure: Present     Pelvic:  Cervical exam deferred        Extremities: Normal range of motion.  Edema: Trace  Mental Status: Normal mood and affect. Normal behavior. Normal judgment and thought content.   Urinalysis:      Assessment and Plan:  Pregnancy: G2P1001 at 2139w6d  1. GDM, class A2 Will increase to glyburide 3.75 with breakfast and 2.5 qhs from 1.25 qhs given AM  fastings 100s-120s and 2hr PPs mostly in the 140s. bpp on Friday. Normal growth earlier this month (83% and AC 93% with normal afi). Continue with ap testing.   2. Supervision of high risk pregnancy, antepartum D/w pt re: bc nv  3. Previous cesarean delivery affecting pregnancy, antepartum See prior notes  4. Obesity (BMI 30-39.9)  5. Obesity affecting pregnancy, antepartum  Preterm labor symptoms and general obstetric precautions including but not limited to vaginal bleeding, contractions, leaking of fluid and fetal movement were reviewed in detail with the patient. Please refer to After Visit Summary for other counseling recommendations.  Return in about 9 days (around 08/23/2017) for nst/afi/rob.   [place] BingPickens, Julia Sinnett, MD

## 2017-08-15 ENCOUNTER — Telehealth: Payer: Self-pay

## 2017-08-15 NOTE — Telephone Encounter (Signed)
Received baby scripts message for high glucose level reading on patient. Called patient she stated she is doing well today. She had a one burrito for lunch today. She stated the provider change her medicine around yesterday when she was seen. I advised patient we will continued to monitored her blood sugars levels and if she needs us to call us back.

## 2017-08-16 ENCOUNTER — Encounter (HOSPITAL_COMMUNITY): Payer: Self-pay

## 2017-08-16 ENCOUNTER — Other Ambulatory Visit: Payer: Self-pay | Admitting: Obstetrics and Gynecology

## 2017-08-16 ENCOUNTER — Ambulatory Visit (HOSPITAL_COMMUNITY)
Admission: RE | Admit: 2017-08-16 | Discharge: 2017-08-16 | Disposition: A | Discharge status: Home / Self Care | Payer: Medicaid Other | Source: Ambulatory Visit | Attending: Obstetrics and Gynecology | Admitting: Obstetrics and Gynecology

## 2017-08-16 DIAGNOSIS — O99213 Obesity complicating pregnancy, third trimester: Secondary | ICD-10-CM

## 2017-08-16 DIAGNOSIS — Z3A32 32 weeks gestation of pregnancy: Secondary | ICD-10-CM

## 2017-08-16 DIAGNOSIS — O24419 Gestational diabetes mellitus in pregnancy, unspecified control: Secondary | ICD-10-CM

## 2017-08-16 DIAGNOSIS — O24415 Gestational diabetes mellitus in pregnancy, controlled by oral hypoglycemic drugs: Secondary | ICD-10-CM | POA: Insufficient documentation

## 2017-08-16 NOTE — Procedures (Signed)
Julia Kline [birth year] 6541w1d  Fetus A Non-Stress Test Interpretation for 08/16/17  Indication: Unsatisfactory BPP  Fetal Heart Rate A Mode: External Baseline Rate (A): 140 bpm Variability: Moderate Accelerations: 15 x 15 Decelerations: Variable Multiple birth?: No  Uterine Activity Mode: Toco Contraction Frequency (min): none noted  Interpretation (Fetal Testing) Nonstress Test Interpretation: Reactive Comments: FHR tracing rev'd by Dr Marjo Bickerenney

## 2017-08-21 ENCOUNTER — Other Ambulatory Visit: Payer: Self-pay | Admitting: Obstetrics and Gynecology

## 2017-08-21 MED ORDER — GLYBURIDE 2.5 MG PO TABS: ORAL_TABLET | ORAL | 2 refills | Status: DC

## 2017-08-23 ENCOUNTER — Ambulatory Visit (INDEPENDENT_AMBULATORY_CARE_PROVIDER_SITE_OTHER): Payer: Medicaid Other | Admitting: Obstetrics and Gynecology

## 2017-08-23 ENCOUNTER — Encounter: Payer: Self-pay | Admitting: Obstetrics and Gynecology

## 2017-08-23 VITALS — BP 109/75 | HR 75 | Wt 250.2 lb

## 2017-08-23 DIAGNOSIS — O99210 Obesity complicating pregnancy, unspecified trimester: Secondary | ICD-10-CM

## 2017-08-23 DIAGNOSIS — O34219 Maternal care for unspecified type scar from previous cesarean delivery: Secondary | ICD-10-CM

## 2017-08-23 DIAGNOSIS — O24419 Gestational diabetes mellitus in pregnancy, unspecified control: Secondary | ICD-10-CM

## 2017-08-23 DIAGNOSIS — O09299 Supervision of pregnancy with other poor reproductive or obstetric history, unspecified trimester: Secondary | ICD-10-CM

## 2017-08-23 DIAGNOSIS — O0990 Supervision of high risk pregnancy, unspecified, unspecified trimester: Secondary | ICD-10-CM

## 2017-08-23 DIAGNOSIS — E669 Obesity, unspecified: Secondary | ICD-10-CM

## 2017-08-23 MED ORDER — GLYBURIDE 2.5 MG PO TABS: ORAL_TABLET | ORAL | 2 refills | Status: DC

## 2017-08-23 NOTE — Progress Notes (Signed)
  Prenatal Visit Note Date: 08/23/2017 Clinic: [facility]  Subjective:  Julia Kline is a 32 y.o. G2P1001 at 612w1d being seen today for ongoing prenatal care.  She is currently monitored for the following issues for this high-risk pregnancy and has Supervision of high risk pregnancy, antepartum; Previous cesarean delivery affecting pregnancy, antepartum; Previous gestational diabetes mellitus, antepartum; Hx of preeclampsia, prior pregnancy, currently pregnant; Hx LEEP (loop electrosurgical excision procedure), cervix, pregnancy; H/O unilateral oophorectomy; GDM, class A2; Obesity affecting pregnancy, antepartum; and Obesity (BMI 30-39.9) on their problem list.  Patient reports no complaints.   Contractions: Not present. Vag. Bleeding: None.  Movement: Present. Denies leaking of fluid.   The following portions of the patient's history were reviewed and updated as appropriate: allergies, current medications, past family history, past medical history, past social history, past surgical history and problem list. Problem list updated.  Objective:   Vitals:   08/23/17 1007  BP: 109/75  Pulse: 75  Weight: 250 lb 3.2 oz (113.5 kg)    Fetal Status: Fetal Heart Rate (bpm): NST   Movement: Present     General:  Alert, oriented and cooperative. Patient is in no acute distress.  Skin: Skin is warm and dry. No rash noted.   Cardiovascular: Normal heart rate noted  Respiratory: Normal respiratory effort, no problems with respiration noted  Abdomen: Soft, gravid, appropriate for gestational age. Pain/Pressure: Present     Pelvic:  Cervical exam deferred        Extremities: Normal range of motion.  Edema: Trace  Mental Status: Normal mood and affect. Normal behavior. Normal judgment and thought content.   Urinalysis:      Assessment and Plan:  Pregnancy: G2P1001 at 4612w1d  1. GDM, class A2 BS values are better but still elevated. AM fastings in the high 90s to 100s and  2hr PP in the 130s-140s. Will increase to glyburide 5/2.5 from 3.75/1.25. rNST and slight poly on u/s today due to MVP of 9. AFI 23-24, cephalic. Continue with 2x/week testing.   2. Supervision of high risk pregnancy, antepartum D/w pt re: bc nv - Fetal nonstress test; Future  3. Previous cesarean delivery affecting pregnancy, antepartum Desires tolac. Sign consent nv  4. Hx of preeclampsia, prior pregnancy, currently pregnant Continue low dose asa.  - US Fetal BPP W/O Non Stress; Future  5. Obesity affecting pregnancy, antepartum  6. Obesity (BMI 30-39.9)  Preterm labor symptoms and general obstetric precautions including but not limited to vaginal bleeding, contractions, leaking of fluid and fetal movement were reviewed in detail with the patient. Please refer to After Visit Summary for other counseling recommendations.  Return in about 1 week (around 08/30/2017) for nst/afi/rob.   [place] BingPickens, Julia Furney, MD

## 2017-08-27 ENCOUNTER — Ambulatory Visit (INDEPENDENT_AMBULATORY_CARE_PROVIDER_SITE_OTHER): Payer: Medicaid Other | Admitting: *Deleted

## 2017-08-27 ENCOUNTER — Other Ambulatory Visit (HOSPITAL_COMMUNITY)
Admission: RE | Admit: 2017-08-27 | Discharge: 2017-08-27 | Disposition: A | Discharge status: Home / Self Care | Payer: Medicaid Other | Source: Ambulatory Visit | Attending: Obstetrics and Gynecology | Admitting: Obstetrics and Gynecology

## 2017-08-27 DIAGNOSIS — O24419 Gestational diabetes mellitus in pregnancy, unspecified control: Secondary | ICD-10-CM

## 2017-08-27 DIAGNOSIS — O9989 Other specified diseases and conditions complicating pregnancy, childbirth and the puerperium: Secondary | ICD-10-CM | POA: Diagnosis not present

## 2017-08-27 DIAGNOSIS — N898 Other specified noninflammatory disorders of vagina: Secondary | ICD-10-CM

## 2017-08-27 LAB — POCT URINALYSIS DIPSTICK
Appearance: NORMAL
Bilirubin, UA: NEGATIVE
Blood, UA: NEGATIVE
Glucose, UA: NEGATIVE
Leukocytes, UA: NEGATIVE
Nitrite, UA: NEGATIVE
Protein, UA: NEGATIVE
Spec Grav, UA: 1.015 (ref 1.010–1.025)
Urobilinogen, UA: 0.2 E.U./dL
pH, UA: 6.5 (ref 5.0–8.0)

## 2017-08-27 MED ORDER — FLUCONAZOLE 150 MG PO TABS: ORAL_TABLET | ORAL | 3 refills | Status: DC

## 2017-08-27 NOTE — Progress Notes (Signed)
SUBJECTIVE:  32 y.o. female complains of white vaginal discharge for 4 day(s) and vaginal itching. Denies abnormal vaginal bleeding or significant pelvic pain or fever. Also has UTI symptoms of pain when starting to urinate. Denies history of known exposure to STD.  Patient's last menstrual period was 12/25/2016 (exact date).  OBJECTIVE:  She appears well, afebrile. Urine dipstick: positive for ketones.  ASSESSMENT:  Vaginal Discharge  Vaginal Odor   PLAN:  GC, chlamydia, trichomonas, BVAG, CVAG probe sent to lab. Treatment: To be determined once lab results are received ROV prn if symptoms persist or worsen.

## 2017-08-28 NOTE — Progress Notes (Signed)
Agree with nursing staff's documentation of this patient's clinic encounter.  Catalina AntiguaPeggy Snow Peoples, MD 08/28/2017 10:45 AM

## 2017-08-29 LAB — CERVICOVAGINAL ANCILLARY ONLY
Bacterial vaginitis: NEGATIVE
Candida vaginitis: NEGATIVE
Chlamydia: NEGATIVE
Neisseria Gonorrhea: NEGATIVE
Trichomonas: NEGATIVE

## 2017-08-30 ENCOUNTER — Ambulatory Visit (INDEPENDENT_AMBULATORY_CARE_PROVIDER_SITE_OTHER): Payer: Medicaid Other | Admitting: Obstetrics and Gynecology

## 2017-08-30 VITALS — BP 123/87 | HR 72 | Wt 248.0 lb

## 2017-08-30 DIAGNOSIS — O24419 Gestational diabetes mellitus in pregnancy, unspecified control: Secondary | ICD-10-CM

## 2017-08-30 LAB — CULTURE, OB URINE

## 2017-08-30 LAB — URINE CULTURE, OB REFLEX

## 2017-08-30 MED ORDER — GLYBURIDE 2.5 MG PO TABS: ORAL_TABLET | ORAL | 2 refills | Status: DC

## 2017-08-30 NOTE — Progress Notes (Signed)
Prenatal Visit Note Date: 08/30/2017 Clinic: [facility]  Subjective:  Julia Kline is a 32 y.o. G2P1001 at 5723w1d being seen today for ongoing prenatal care.  She is currently monitored for the following issues for this high-risk pregnancy and has Supervision of high risk pregnancy, antepartum; Previous cesarean delivery affecting pregnancy, antepartum; Previous gestational diabetes mellitus, antepartum; Hx of preeclampsia, prior pregnancy, currently pregnant; Hx LEEP (loop electrosurgical excision procedure), cervix, pregnancy; H/O unilateral oophorectomy; GDM, class A2; Obesity affecting pregnancy, antepartum; and Obesity (BMI 30-39.9) on their problem list.  Patient reports no complaints.   Contractions: Not present. Vag. Bleeding: None.  Movement: Present. Denies leaking of fluid.   The following portions of the patient's history were reviewed and updated as appropriate: allergies, current medications, past family history, past medical history, past social history, past surgical history and problem list. Problem list updated.  Objective:   Vitals:   08/30/17 1047  BP: 123/87  Pulse: 72  Weight: 248 lb (112.5 kg)    Fetal Status: Fetal Heart Rate (bpm): NST    Movement: Present     General:  Alert, oriented and cooperative. Patient is in no acute distress.  Skin: Skin is warm and dry. No rash noted.   Cardiovascular: Normal heart rate noted  Respiratory: Normal respiratory effort, no problems with respiration noted  Abdomen: Soft, gravid, appropriate for gestational age. Pain/Pressure: Present     Pelvic:  Cervical exam deferred        Extremities: Normal range of motion.  Edema: Trace  Mental Status: Normal mood and affect. Normal behavior. Normal judgment and thought content.   Urinalysis:      Assessment and Plan:  Pregnancy: G2P1001 at 1923w1d  1. GDM, class A2 rNST today and AFI normal, cephalic. BS values improved but only doing sporadic 2hr PP  values with AM fastings in the 100s. Will increase from glyburide 5/3.75 with breakfast and dinner 5/5  - Fetal nonstress test - US bedside; Future  Preterm labor symptoms and general obstetric precautions including but not limited to vaginal bleeding, contractions, leaking of fluid and fetal movement were reviewed in detail with the patient. Please refer to After Visit Summary for other counseling recommendations.  Return in about 1 week (around 09/06/2017) for rob only. and 1/22 nst/afi. 2wk nst/rob.   [place] BingPickens, Nalleli Largent, MD

## 2017-08-31 ENCOUNTER — Encounter (INDEPENDENT_AMBULATORY_CARE_PROVIDER_SITE_OTHER): Payer: Self-pay

## 2017-09-02 ENCOUNTER — Other Ambulatory Visit: Payer: Self-pay

## 2017-09-02 ENCOUNTER — Encounter: Payer: Self-pay | Admitting: *Deleted

## 2017-09-02 DIAGNOSIS — O2242 Hemorrhoids in pregnancy, second trimester: Secondary | ICD-10-CM

## 2017-09-02 MED ORDER — HYDROCORTISONE ACETATE 25 MG RE SUPP: 25.0000 mg | Freq: Two times a day (BID) | RECTAL | 1 refills | Status: DC

## 2017-09-02 MED ORDER — HYDROCORTISONE ACE-PRAMOXINE 1-1 % RE FOAM: 1.0000 | Freq: Two times a day (BID) | RECTAL | 0 refills | Status: DC

## 2017-09-02 NOTE — Telephone Encounter (Signed)
Patient called stating she was having so issue with her hemorrhoids,She is requesting something be called in. Per Dr.Dove patient can try procot form. This has been called in to patient pharmacy.

## 2017-09-03 ENCOUNTER — Ambulatory Visit (HOSPITAL_COMMUNITY)
Admission: RE | Admit: 2017-09-03 | Discharge: 2017-09-03 | Disposition: A | Discharge status: Home / Self Care | Payer: Medicaid Other | Source: Ambulatory Visit | Attending: Obstetrics & Gynecology | Admitting: Obstetrics & Gynecology

## 2017-09-04 ENCOUNTER — Encounter (HOSPITAL_COMMUNITY): Payer: Self-pay

## 2017-09-04 ENCOUNTER — Other Ambulatory Visit: Payer: Self-pay | Admitting: Obstetrics and Gynecology

## 2017-09-04 ENCOUNTER — Other Ambulatory Visit: Payer: Self-pay

## 2017-09-04 ENCOUNTER — Ambulatory Visit (HOSPITAL_COMMUNITY)
Admission: RE | Admit: 2017-09-04 | Discharge: 2017-09-04 | Disposition: A | Discharge status: Home / Self Care | Payer: Medicaid Other | Source: Ambulatory Visit | Attending: Obstetrics & Gynecology | Admitting: Obstetrics & Gynecology

## 2017-09-04 ENCOUNTER — Inpatient Hospital Stay (HOSPITAL_COMMUNITY)
Admission: AD | Admit: 2017-09-04 | Discharge: 2017-09-04 | Disposition: A | Discharge status: Home / Self Care | Payer: Medicaid Other | Source: Ambulatory Visit | Attending: Obstetrics & Gynecology | Admitting: Obstetrics & Gynecology

## 2017-09-04 DIAGNOSIS — Z3A34 34 weeks gestation of pregnancy: Secondary | ICD-10-CM | POA: Diagnosis not present

## 2017-09-04 DIAGNOSIS — O0990 Supervision of high risk pregnancy, unspecified, unspecified trimester: Secondary | ICD-10-CM

## 2017-09-04 DIAGNOSIS — E669 Obesity, unspecified: Secondary | ICD-10-CM | POA: Diagnosis not present

## 2017-09-04 DIAGNOSIS — Z362 Encounter for other antenatal screening follow-up: Secondary | ICD-10-CM | POA: Diagnosis present

## 2017-09-04 DIAGNOSIS — O09299 Supervision of pregnancy with other poor reproductive or obstetric history, unspecified trimester: Secondary | ICD-10-CM

## 2017-09-04 DIAGNOSIS — O24414 Gestational diabetes mellitus in pregnancy, insulin controlled: Secondary | ICD-10-CM | POA: Insufficient documentation

## 2017-09-04 DIAGNOSIS — Z9889 Other specified postprocedural states: Secondary | ICD-10-CM | POA: Insufficient documentation

## 2017-09-04 DIAGNOSIS — O133 Gestational [pregnancy-induced] hypertension without significant proteinuria, third trimester: Secondary | ICD-10-CM | POA: Diagnosis not present

## 2017-09-04 DIAGNOSIS — Z8249 Family history of ischemic heart disease and other diseases of the circulatory system: Secondary | ICD-10-CM | POA: Insufficient documentation

## 2017-09-04 DIAGNOSIS — Z833 Family history of diabetes mellitus: Secondary | ICD-10-CM | POA: Insufficient documentation

## 2017-09-04 DIAGNOSIS — Z7982 Long term (current) use of aspirin: Secondary | ICD-10-CM | POA: Insufficient documentation

## 2017-09-04 DIAGNOSIS — O99210 Obesity complicating pregnancy, unspecified trimester: Secondary | ICD-10-CM

## 2017-09-04 DIAGNOSIS — O24415 Gestational diabetes mellitus in pregnancy, controlled by oral hypoglycemic drugs: Secondary | ICD-10-CM

## 2017-09-04 DIAGNOSIS — Z885 Allergy status to narcotic agent status: Secondary | ICD-10-CM | POA: Insufficient documentation

## 2017-09-04 DIAGNOSIS — Z794 Long term (current) use of insulin: Secondary | ICD-10-CM | POA: Insufficient documentation

## 2017-09-04 DIAGNOSIS — Z8759 Personal history of other complications of pregnancy, childbirth and the puerperium: Secondary | ICD-10-CM | POA: Diagnosis not present

## 2017-09-04 DIAGNOSIS — Z8632 Personal history of gestational diabetes: Secondary | ICD-10-CM

## 2017-09-04 DIAGNOSIS — Z888 Allergy status to other drugs, medicaments and biological substances status: Secondary | ICD-10-CM | POA: Diagnosis not present

## 2017-09-04 DIAGNOSIS — O99213 Obesity complicating pregnancy, third trimester: Secondary | ICD-10-CM | POA: Insufficient documentation

## 2017-09-04 DIAGNOSIS — O34219 Maternal care for unspecified type scar from previous cesarean delivery: Secondary | ICD-10-CM | POA: Diagnosis not present

## 2017-09-04 DIAGNOSIS — Z3689 Encounter for other specified antenatal screening: Secondary | ICD-10-CM

## 2017-09-04 DIAGNOSIS — O3440 Maternal care for other abnormalities of cervix, unspecified trimester: Secondary | ICD-10-CM

## 2017-09-04 LAB — URINALYSIS, ROUTINE W REFLEX MICROSCOPIC
Bilirubin Urine: NEGATIVE
Glucose, UA: NEGATIVE mg/dL
Hgb urine dipstick: NEGATIVE
Ketones, ur: NEGATIVE mg/dL
Nitrite: NEGATIVE
Protein, ur: 30 mg/dL — AB
Specific Gravity, Urine: 1.019 (ref 1.005–1.030)
pH: 6 (ref 5.0–8.0)

## 2017-09-04 LAB — GLUCOSE, CAPILLARY
Glucose-Capillary: 59 mg/dL — ABNORMAL LOW (ref 65–99)
Glucose-Capillary: 101 mg/dL — ABNORMAL HIGH (ref 65–99)

## 2017-09-04 NOTE — MAU Provider Note (Signed)
History     CSN: [id number]  Arrival date and time: 09/04/17 1654   First Provider Initiated Contact with Patient 09/04/17 1807      Chief Complaint  Patient presents with  . Non-stress Test   HPI Julia Kline is a 32 y.o. G2P1001 at 72w6dwho presents from MFM for fetal monitoring. Had BPP today d/t GDM. BPP 6/8, off for breathing & patient had variable decel down to 73 during ultrasound. Patient denies abdominal pain, vaginal bleeding, LOF, or decreased fetal movement.  Upon arrival to MAU, patient reports feeling like her blood sugar is low. She takes glyburide 5 mg BID. Reports that she hasn't eaten anything since breakfast this morning.  OB History    Gravida Para Term Preterm AB Living   _0 0 0 1   SAB TAB Ectopic Multiple Live Births   0 0 0 0 1      Past Medical History:  Diagnosis Date  . Eczema   . Gestational diabetes    insulin controlled  . [place]   . Hx of preeclampsia, prior pregnancy, currently pregnant   . Hypertension   . Migraine   . PCOS (polycystic ovarian syndrome)   . Pregnancy induced hypertension   . Pregnant   . Vaginal Pap smear, abnormal     Past Surgical History:  Procedure Laterality Date  . CESAREAN SECTION  2017   Procedure: CESAREAN DELIVERY ONLY; Surgeon: KLina Sar MD; [facility]  . LAPAROSCOPIC OOPHERECTOMY Left   . LEEP    . OVARIAN CYST REMOVAL    . TONSILLECTOMY    . WISDOM TOOTH EXTRACTION      Family History  Problem Relation Age of Onset  . Diabetes Mother   . Diabetes Father   . Hypertension Maternal Grandmother     Social History   Tobacco Use  . Smoking status: Never Smoker  . Smokeless tobacco: Never Used  Substance Use Topics  . Alcohol use: No  . Drug use: No    Allergies:  Allergies  Allergen Reactions  . Pseudoephedrine Hcl Other (See Comments)  . Morphine And Related Rash    Medications Prior to Admission  Medication Sig  Dispense Refill Last Dose  . ACCU-CHEK FASTCLIX LANCETS MISC 1 Units by Percutaneous route 4 (four) times daily. 100 each 12 Taking  . aspirin 81 MG chewable tablet Chew 81 mg by mouth daily.   Taking  . Blood Glucose Monitoring Suppl (ACCU-CHEK NANO SMARTVIEW) w/Device KIT 1 kit by Subdermal route as directed. Check blood sugars for fasting, and two hours after breakfast, lunch and dinner (4 checks daily) 1 kit 0 Taking  . docusate sodium (COLACE) 100 MG capsule Take 100 mg by mouth.   Taking  . fluconazole (DIFLUCAN) 150 MG tablet Take 1 tab once, may take additional tab 3 days later if Sx persist (Patient not taking: Reported on 09/04/2017) 3 tablet 3 Not Taking  . glucose blood (ACCU-CHEK SMARTVIEW) test strip Use as instructed to check blood sugars 100 each 12 Taking  . glyBURIDE (DIABETA) 2.5 MG tablet Two tablets with breakfast and two tablets with dinner. 60 tablet 2 Taking  . hydrocortisone (ANUSOL-HC) 25 MG suppository Place 1 suppository (25 mg total) rectally 2 (two) times daily. 12 suppository 1 Taking  . hydrocortisone-pramoxine (PROCTOFOAM HC) rectal foam Place 1 applicator rectally 2 (two) times daily. 10 g 0   . polyethylene glycol powder (GLYCOLAX/MIRALAX) powder Take 17 g  by mouth daily as needed for moderate constipation. 500 g 2 Taking  . prenatal vitamin w/FE, FA (PRENATAL 1 + 1) 27-1 MG TABS tablet Take 1 tablet by mouth daily at 12 noon.   Taking    Review of Systems  Constitutional: Negative.   Gastrointestinal: Negative.   Genitourinary: Negative.   Neurological: Positive for light-headedness. Negative for headaches.   Physical Exam   Blood pressure 124/89, pulse 98, temperature 98.1 F (36.7 C), temperature source Oral, resp. rate 18, last menstrual period 12/25/2016, SpO2 100 %.  Physical Exam  Nursing note and vitals reviewed. Constitutional: She is oriented to person, place, and time. She appears well-developed and well-nourished. No distress.  HENT:  Head:  Normocephalic and atraumatic.  Eyes: Conjunctivae are normal. Right eye exhibits no discharge. Left eye exhibits no discharge. No scleral icterus.  Neck: Normal range of motion.  Respiratory: Effort normal. No respiratory distress.  GI: Soft. There is no tenderness.  Neurological: She is alert and oriented to person, place, and time.  Skin: Skin is warm and dry. She is not diaphoretic.  Psychiatric: She has a normal mood and affect. Her behavior is normal. Judgment and thought content normal.    MAU Course  Procedures Results for orders placed or performed during the hospital encounter of 09/04/17 (from the past 24 hour(s))  Urinalysis, Routine w reflex microscopic     Status: Abnormal   Collection Time: 09/04/17  5:07 PM  Result Value Ref Range   Color, Urine YELLOW YELLOW   APPearance CLOUDY (A) CLEAR   Specific Gravity, Urine 1.019 1.005 - 1.030   pH 6.0 5.0 - 8.0   Glucose, UA NEGATIVE NEGATIVE mg/dL   Hgb urine dipstick NEGATIVE NEGATIVE   Bilirubin Urine NEGATIVE NEGATIVE   Ketones, ur NEGATIVE NEGATIVE mg/dL   Protein, ur 30 (A) NEGATIVE mg/dL   Nitrite NEGATIVE NEGATIVE   Leukocytes, UA TRACE (A) NEGATIVE   RBC / HPF 0-5 0 - 5 RBC/hpf   WBC, UA 6-30 0 - 5 WBC/hpf   Bacteria, UA MANY (A) NONE SEEN   Squamous Epithelial / LPF 6-30 (A) NONE SEEN   Mucus PRESENT   Glucose, capillary     Status: Abnormal   Collection Time: 09/04/17  5:33 PM  Result Value Ref Range   Glucose-Capillary 59 (L) 65 - 99 mg/dL  Glucose, capillary     Status: Abnormal   Collection Time: 09/04/17  6:32 PM  Result Value Ref Range   Glucose-Capillary 101 (H) 65 - 99 mg/dL    MDM CBG 59 --- pt ate sandwich that her husband brought her & recheck was 101 -- pt reports improvement in symptoms.  NST:  Baseline: 140 bpm, Variability: Good {> 6 bpm), Accelerations: Reactive and Decelerations: Absent Reactive fetal tracing Patient has f/u in office tomorrow  Assessment and Plan  A: 1. NST  (non-stress test) reactive   2. Gestational diabetes mellitus (GDM) in third trimester controlled on oral hypoglycemic drug   3. [redacted] weeks gestation of pregnancy    P: Discharge home Stressed importance of eating regularly throughout the day esp while taking glyburide to avoid hypoglycemic episodes Fetal kick count form   Erin Lawrence 09/04/2017, 6:40 PM  

## 2017-09-04 NOTE — MAU Note (Signed)
Pt presents MAU from MFM due to fetal deceleration and BPP 6/8. Pt has had lower abdominal cramping since last night. Pt denies VB and LOF. +FM

## 2017-09-04 NOTE — Discharge Instructions (Signed)
Diabetes Mellitus and Nutrition When you have diabetes (diabetes mellitus), it is very important to have healthy eating habits because your blood sugar (glucose) levels are greatly affected by what you eat and drink. Eating healthy foods in the appropriate amounts, at about the same times every day, can help you:  Control your blood glucose.  Lower your risk of heart disease.  Improve your blood pressure.  Reach or maintain a healthy weight.  Every person with diabetes is different, and each person has different needs for a meal plan. Your health care provider may recommend that you work with a diet and nutrition specialist (dietitian) to make a meal plan that is best for you. Your meal plan may vary depending on factors such as:  The calories you need.  The medicines you take.  Your weight.  Your blood glucose, blood pressure, and cholesterol levels.  Your activity level.  Other health conditions you have, such as heart or kidney disease.  How do carbohydrates affect me? Carbohydrates affect your blood glucose level more than any other type of food. Eating carbohydrates naturally increases the amount of glucose in your blood. Carbohydrate counting is a method for keeping track of how many carbohydrates you eat. Counting carbohydrates is important to keep your blood glucose at a healthy level, especially if you use insulin or take certain oral diabetes medicines. It is important to know how many carbohydrates you can safely have in each meal. This is different for every person. Your dietitian can help you calculate how many carbohydrates you should have at each meal and for snack. Foods that contain carbohydrates include:  Bread, cereal, rice, pasta, and crackers.  Potatoes and corn.  Peas, beans, and lentils.  Milk and yogurt.  Fruit and juice.  Desserts, such as cakes, cookies, ice cream, and candy.  How does alcohol affect me? Alcohol can cause a sudden decrease in blood  glucose (hypoglycemia), especially if you use insulin or take certain oral diabetes medicines. Hypoglycemia can be a life-threatening condition. Symptoms of hypoglycemia (sleepiness, dizziness, and confusion) are similar to symptoms of having too much alcohol. If your health care provider says that alcohol is safe for you, follow these guidelines:  Limit alcohol intake to no more than 1 drink per day for nonpregnant women and 2 drinks per day for men. One drink equals 12 oz of beer, 5 oz of wine, or 1 oz of hard liquor.  Do not drink on an empty stomach.  Keep yourself hydrated with water, diet soda, or unsweetened iced tea.  Keep in mind that regular soda, juice, and other mixers may contain a lot of sugar and must be counted as carbohydrates.  What are tips for following this plan? Reading food labels  Start by checking the serving size on the label. The amount of calories, carbohydrates, fats, and other nutrients listed on the label are based on one serving of the food. Many foods contain more than one serving per package.  Check the total grams (g) of carbohydrates in one serving. You can calculate the number of servings of carbohydrates in one serving by dividing the total carbohydrates by 15. For example, if a food has 30 g of total carbohydrates, it would be equal to 2 servings of carbohydrates.  Check the number of grams (g) of saturated and trans fats in one serving. Choose foods that have low or no amount of these fats.  Check the number of milligrams (mg) of sodium in one serving. Most people  should limit total sodium intake to less than 2,300 mg per day.  Always check the nutrition information of foods labeled as "low-fat" or "nonfat". These foods may be higher in added sugar or refined carbohydrates and should be avoided.  Talk to your dietitian to identify your daily goals for nutrients listed on the label. Shopping  Avoid buying canned, premade, or processed foods. These  foods tend to be high in fat, sodium, and added sugar.  Shop around the outside edge of the grocery store. This includes fresh fruits and vegetables, bulk grains, fresh meats, and fresh dairy. Cooking  Use low-heat cooking methods, such as baking, instead of high-heat cooking methods like deep frying.  Cook using healthy oils, such as olive, canola, or sunflower oil.  Avoid cooking with butter, cream, or high-fat meats. Meal planning  Eat meals and snacks regularly, preferably at the same times every day. Avoid going long periods of time without eating.  Eat foods high in fiber, such as fresh fruits, vegetables, beans, and whole grains. Talk to your dietitian about how many servings of carbohydrates you can eat at each meal.  Eat 4-6 ounces of lean protein each day, such as lean meat, chicken, fish, eggs, or tofu. 1 ounce is equal to 1 ounce of meat, chicken, or fish, 1 egg, or 1/4 cup of tofu.  Eat some foods each day that contain healthy fats, such as avocado, nuts, seeds, and fish. Lifestyle   Check your blood glucose regularly.  Exercise at least 30 minutes 5 or more days each week, or as told by your health care provider.  Take medicines as told by your health care provider.  Do not use any products that contain nicotine or tobacco, such as cigarettes and e-cigarettes. If you need help quitting, ask your health care provider.  Work with a Veterinary surgeon or diabetes educator to identify strategies to manage stress and any emotional and social challenges. What are some questions to ask my health care provider?  Do I need to meet with a diabetes educator?  Do I need to meet with a dietitian?  What number can I call if I have questions?  When are the best times to check my blood glucose? Where to find more information:  American Diabetes Association: diabetes.org/food-and-fitness/food  Academy of Nutrition and Dietetics:  https://www.vargas.com/  [employer] of Diabetes and Digestive and Kidney Diseases (NIH): FindJewelers.cz Summary  A healthy meal plan will help you control your blood glucose and maintain a healthy lifestyle.  Working with a diet and nutrition specialist (dietitian) can help you make a meal plan that is best for you.  Keep in mind that carbohydrates and alcohol have immediate effects on your blood glucose levels. It is important to count carbohydrates and to use alcohol carefully. This information is not intended to replace advice given to you by your health care provider. Make sure you discuss any questions you have with your health care provider. Document Released: 05/03/2005 Document Revised: 09/10/2016 Document Reviewed: 09/10/2016 Elsevier Interactive Patient Education  2018 [employer]. Fetal Movement Counts Patient Name: ________________________________________________ Patient Due Date: ____________________ What is a fetal movement count? A fetal movement count is the number of times that you feel your baby move during a certain amount of time. This may also be called a fetal kick count. A fetal movement count is recommended for every pregnant woman. You may be asked to start counting fetal movements as early as week 28 of your pregnancy. Pay attention to when  your baby is most active. You may notice your baby's sleep and wake cycles. You may also notice things that make your baby move more. You should do a fetal movement count:  When your baby is normally most active.  At the same time each day.  A good time to count movements is while you are resting, after having something to eat and drink. How do I count fetal movements? 1. Find a quiet, comfortable area. Sit, or lie down on your side. 2. Write down the date, the start time and stop time, and the number of  movements that you felt between those two times. Take this information with you to your health care visits. 3. For 2 hours, count kicks, flutters, swishes, rolls, and jabs. You should feel at least 10 movements during 2 hours. 4. You may stop counting after you have felt 10 movements. 5. If you do not feel 10 movements in 2 hours, have something to eat and drink. Then, keep resting and counting for 1 hour. If you feel at least 4 movements during that hour, you may stop counting. Contact a health care provider if:  You feel fewer than 4 movements in 2 hours.  Your baby is not moving like he or she usually does. Date: ____________ Start time: ____________ Stop time: ____________ Movements: ____________ Date: ____________ Start time: ____________ Stop time: ____________ Movements: ____________ Date: ____________ Start time: ____________ Stop time: ____________ Movements: ____________ Date: ____________ Start time: ____________ Stop time: ____________ Movements: ____________ Date: ____________ Start time: ____________ Stop time: ____________ Movements: ____________ Date: ____________ Start time: ____________ Stop time: ____________ Movements: ____________ Date: ____________ Start time: ____________ Stop time: ____________ Movements: ____________ Date: ____________ Start time: ____________ Stop time: ____________ Movements: ____________ Date: ____________ Start time: ____________ Stop time: ____________ Movements: ____________ This information is not intended to replace advice given to you by your health care provider. Make sure you discuss any questions you have with your health care provider. Document Released: 09/05/2006 Document Revised: 04/04/2016 Document Reviewed: 09/15/2015 Elsevier Interactive Patient Education  [employer]2018 [employer]

## 2017-09-05 ENCOUNTER — Ambulatory Visit (INDEPENDENT_AMBULATORY_CARE_PROVIDER_SITE_OTHER): Payer: Medicaid Other | Admitting: Obstetrics & Gynecology

## 2017-09-05 ENCOUNTER — Telehealth: Payer: Self-pay | Admitting: Radiology

## 2017-09-05 ENCOUNTER — Encounter: Payer: Self-pay | Admitting: Obstetrics and Gynecology

## 2017-09-05 VITALS — BP 122/72 | HR 76 | Wt 248.0 lb

## 2017-09-05 DIAGNOSIS — O09299 Supervision of pregnancy with other poor reproductive or obstetric history, unspecified trimester: Secondary | ICD-10-CM

## 2017-09-05 DIAGNOSIS — O09293 Supervision of pregnancy with other poor reproductive or obstetric history, third trimester: Secondary | ICD-10-CM

## 2017-09-05 DIAGNOSIS — O3660X0 Maternal care for excessive fetal growth, unspecified trimester, not applicable or unspecified: Secondary | ICD-10-CM | POA: Insufficient documentation

## 2017-09-05 DIAGNOSIS — O0990 Supervision of high risk pregnancy, unspecified, unspecified trimester: Secondary | ICD-10-CM

## 2017-09-05 DIAGNOSIS — O0993 Supervision of high risk pregnancy, unspecified, third trimester: Secondary | ICD-10-CM

## 2017-09-05 DIAGNOSIS — O24419 Gestational diabetes mellitus in pregnancy, unspecified control: Secondary | ICD-10-CM

## 2017-09-05 NOTE — Patient Instructions (Signed)

## 2017-09-05 NOTE — Progress Notes (Signed)
PRENATAL VISIT NOTE  Subjective:  Julia Kline is a 32 y.o. G2P1001 at [redacted]w[redacted]d being seen today for ongoing prenatal care.  She is currently monitored for the following issues for this high-risk pregnancy and has Supervision of high risk pregnancy, antepartum; Previous cesarean delivery affecting pregnancy, antepartum; Previous gestational diabetes mellitus, antepartum; Hx of preeclampsia, prior pregnancy, currently pregnant; Hx LEEP (loop electrosurgical excision procedure), cervix, pregnancy; H/O unilateral oophorectomy; GDM, class A2; Obesity affecting pregnancy, antepartum; Obesity (BMI 30-39.9); and LGA (large for gestational age) fetus affecting management of mother on their problem list.  Patient reports no complaints.  Contractions: Not present.  .  Movement: Present. Denies leaking of fluid.   The following portions of the patient's history were reviewed and updated as appropriate: allergies, current medications, past family history, past medical history, past social history, past surgical history and problem list. Problem list updated.  Objective:   Vitals:   09/05/17 0854  BP: 122/72  Pulse: 76  Weight: 248 lb (112.5 kg)    Fetal Status: Fetal Heart Rate (bpm): 145 Fundal Height: 38 cm Movement: Present     General:  Alert, oriented and cooperative. Patient is in no acute distress.  Skin: Skin is warm and dry. No rash noted.   Cardiovascular: Normal heart rate noted  Respiratory: Normal respiratory effort, no problems with respiration noted  Abdomen: Soft, gravid, appropriate for gestational age.  Pain/Pressure: Present     Pelvic: Cervical exam deferred        Extremities: Normal range of motion.  Edema: Trace  Mental Status:  Normal mood and affect. Normal behavior. Normal judgment and thought content.   [place] Mfm Fetal Bpp Wo Non Stress  Result Date: 09/04/2017 ----------------------------------------------------------------------  OBSTETRICS REPORT                       (Signed Final 09/04/2017 04:53 pm) ---------------------------------------------------------------------- Patient Info  ID #:       [id number]                          D.O.B.:  [birth year] (31 yrs)  Name:       Julia Kline                Visit Date: 09/04/2017 04:03 pm ---------------------------------------------------------------------- Performed By  Performed By:     Earley Brooke     Secondary Phy.:   [facility]                    BS, RDMS                                                             Center for                                                             [facility]  [facility]  Attending:        Charlsie Merles MD         Address:          [address]                                                             [address]  Referred By:      Reva Bores          Location:         [facility]                    MD  Ref. Address:     [address]                    [place], [place]                    [address] ---------------------------------------------------------------------- Orders   #  Description                                 Code   1  [place] MFM OB FOLLOW UP                         [phone]   2  [place] MFM FETAL BPP WO NON STRESS              76819.01  ----------------------------------------------------------------------   #  Ordered By               Order #        Accession #    Episode #   1  MARK Ezzard Standing              [id number]      [id number]     [id number]   2  CHARLIE PICKENS          [id number]      [id number]     [id number]  ---------------------------------------------------------------------- Indications   [redacted] weeks gestation of pregnancy                Z3A.34   Poor obstetric history: Previous gestational   O09.299   diabetes/preeclampsia   History of cesarean delivery, currently        O34.219   pregnant   Obesity complicating pregnancy, third          O99.213   trimester   Gestational diabetes in  pregnancy,             O24.415   controlled by oral hypoglycemic drugs   (glyburide)   Encounter for other antenatal screening        Z36.2   follow-up  ---------------------------------------------------------------------- OB History  Blood Type:            Height:  5'9"   Weight (lb):  238       BMI:  35.14  Gravidity:    2         Term:   1        Prem:  0        SAB:   0  TOP:          0       Ectopic:  0        Living: 1 ---------------------------------------------------------------------- Fetal Evaluation  Num Of Fetuses:     1  Fetal Heart         72  Rate(bpm):  Cardiac Activity:   Observed  Presentation:       Cephalic  Placenta:           Anterior, above cervical os  P. Cord Insertion:  Previously seen as normal  Amniotic Fluid  AFI FV:      Subjectively within normal limits  AFI Sum(cm)     %Tile       Largest Pocket(cm)  18.9            70          8.43  RUQ(cm)       RLQ(cm)       LUQ(cm)        LLQ(cm)  8.43          2.19          5.71           2.57  Comment:    Fetal decel noted (72 bpm) with spontaneous recovery to              140bpm. ---------------------------------------------------------------------- Biophysical Evaluation  Amniotic F.V:   Pocket => 2 cm two         F. Tone:        Observed                  planes  F. Movement:    Observed                   Score:          6/8  F. Breathing:   Not Observed ---------------------------------------------------------------------- Biometry  BPD:      88.9  mm     G. Age:  36w 0d         81  %    CI:        76.05   %    70 - 86                                                          FL/HC:      22.2   %    20.1 - 22.3  HC:      323.1  mm     G. Age:  36w 4d         58  %    HC/AC:      0.96        0.93 - 1.11  AC:      337.5  mm     G. Age:  37w 5d       > 97  %    FL/BPD:     80.5   %    71 - 87  FL:       71.6  mm     G. Age:  36w 5d         85  %    FL/AC:  21.2   %    20 - 24  Est. FW:    3115  gm    6 lb 14 oz    > 90  %  ---------------------------------------------------------------------- Gestational Age  U/S Today:     36w 5d                                        EDD:   09/27/17  Best:          34w 6d     Det. ByMarcella Dubs:  Early Ultrasound         EDD:   10/10/17                                      (03/01/17) ---------------------------------------------------------------------- Anatomy  Cranium:               Appears normal         Aortic Arch:            Previously seen  Cavum:                 Previously seen        Ductal Arch:            Previously seen  Ventricles:            Appears normal         Diaphragm:              Previously seen  Choroid Plexus:        Previously seen        Stomach:                Appears normal, left                                                                        sided  Cerebellum:            Previously seen        Abdomen:                Appears normal  Posterior Fossa:       Previously seen        Abdominal Wall:         Previously seen  Nuchal Fold:           Not applicable (>20    Cord Vessels:           Previously seen                         wks GA)  Face:                  Orbits previously      Kidneys:                Appear normal                         seen; profile nws  Lips:  Previously seen        Bladder:                Appears normal  Thoracic:              Appears normal         Spine:                  Previously seen  Heart:                 Previously seen        Upper Extremities:      Previously seen  RVOT:                  Previously seen        Lower Extremities:      Previously seen  LVOT:                  Previously seen  Other:  Female gender previously visualized. Technically difficult due to          maternal habitus and fetal position. ---------------------------------------------------------------------- Cervix Uterus Adnexa  Cervix  Not visualized (advanced GA >29wks) ---------------------------------------------------------------------- Impression   Singleton intrauterine pregnancy at 34+6 weeks with GDM  here for growth and BPP  Interval review of the anatomy shows no sonographic  markers for aneuploidy or structural anomalies  All relevant fetal anatomy has been visualized  Amniotic fluid volume is normal  Estimated fetal weight shows growth at >90th percentile  BPP 6/8 with no fetal breathing  FHR deceleration to 70s noted ---------------------------------------------------------------------- Recommendations  To MAU for evaluation ----------------------------------------------------------------------                 Charlsie Merles, MD Electronically Signed Final Report   09/04/2017 04:53 pm ----------------------------------------------------------------------  [place] Mfm Fetal Bpp Wo Non Stress  Result Date: 08/16/2017 ----------------------------------------------------------------------  OBSTETRICS REPORT                      (Signed Final 08/16/2017 11:31 am) ---------------------------------------------------------------------- Patient Info  ID #:       [id number]                          D.O.B.:  26-Aug-1985 (31 yrs)  Name:       ASYA DERRYBERRY Ferrelli                Visit Date: 08/16/2017 10:42 am ---------------------------------------------------------------------- Performed By  Performed By:     Marcellina Millin          Ref. Address:     [address]                                                             [place], [place]  [address]  Attending:        Durwin Nora       Location:         [facility]                    MD  Referred By:      Reva Bores                    MD ---------------------------------------------------------------------- Orders   #  Description                                 Code   1  [place] MFM FETAL BPP WO NON STRESS              [phone]   ----------------------------------------------------------------------   #  Ordered By               Order #        Accession #    Episode #   1  Pickens Bing          [id number]      [id number]     [id number]  ---------------------------------------------------------------------- Indications   [redacted] weeks gestation of pregnancy                Z3A.32   Poor obstetric history: Previous gestational   O09.299   diabetes/preeclampsia   History of cesarean delivery, currently        O34.219   pregnant   Obesity complicating pregnancy, third          O99.213   trimester   Gestational diabetes in pregnancy,             O24.415   controlled by oral hypoglycemic drugs   (glyburide)  ---------------------------------------------------------------------- OB History  Blood Type:            Height:  5'9"   Weight (lb):  238       BMI:  35.14  Gravidity:    2         Term:   1        Prem:   0        SAB:   0  TOP:          0       Ectopic:  0        Living: 1 ---------------------------------------------------------------------- Fetal Evaluation  Num Of Fetuses:     1  Fetal Heart         140  Rate(bpm):  Cardiac Activity:   Observed  Presentation:       Cephalic  Amniotic Fluid  AFI FV:      Subjectively within normal limits  AFI Sum(cm)     %Tile       Largest Pocket(cm)  17.2            63          6.6  RUQ(cm)       RLQ(cm)       LUQ(cm)        LLQ(cm)  5.1           2.5           6.6            3  Comment:    6/8 BPP ---------------------------------------------------------------------- Biophysical Evaluation  Amniotic F.V:   Within normal limits  F. Tone:        Observed  F. Movement:    Observed                   N.S.T:          Reactive  F. Breathing:   Not Observed               Score:          8/10 ---------------------------------------------------------------------- Gestational Age  Best:          32w 1d     Det. By:  [place]ella Dubs         EDD:   10/10/17                                      (03/01/17)  ---------------------------------------------------------------------- Impression  SIUP at 32 weeks 1day, GDMA2  active fetus  BPP 8/10  AFI is normal ---------------------------------------------------------------------- Recommendations  Continue antenatal testing. ----------------------------------------------------------------------               Durwin Nora, MD Electronically Signed Final Report   08/16/2017 11:31 am ----------------------------------------------------------------------  [place] Mfm Ob Follow Up  Result Date: 09/04/2017 ----------------------------------------------------------------------  OBSTETRICS REPORT                      (Signed Final 09/04/2017 04:53 pm) ---------------------------------------------------------------------- Patient Info  ID #:       [id number]                          D.O.B.:  29-Mar-1986 (31 yrs)  Name:       ROSE-MARIE HICKLING                Visit Date: 09/04/2017 04:03 pm ---------------------------------------------------------------------- Performed By  Performed By:     Earley Brooke     Secondary Phy.:   [facility]                    BS, RDMS                                                             Center for                                                             [facility]                                                             Healthcare  Attending:        Charlsie Merles MD         Address:          [address]  Referred By:      Reva Bores          Location:         [facility]                    MD  Ref. Address:     [address]                    [place], [place]                    [address] ---------------------------------------------------------------------- Orders   #  Description                                 Code   1  [place] MFM OB FOLLOW UP                         60454.09   2  [place] MFM FETAL BPP WO NON STRESS              81191.47   ----------------------------------------------------------------------   #  Ordered By               Order #        Accession #    Episode #   1  MARK Ezzard Standing              [id number]      [id number]     [id number]   2  CHARLIE PICKENS          [id number]      [id number]     [id number]  ---------------------------------------------------------------------- Indications   [redacted] weeks gestation of pregnancy                Z3A.34   Poor obstetric history: Previous gestational   O09.299   diabetes/preeclampsia   History of cesarean delivery, currently        O34.219   pregnant   Obesity complicating pregnancy, third          O99.213   trimester   Gestational diabetes in pregnancy,             O24.415   controlled by oral hypoglycemic drugs   (glyburide)   Encounter for other antenatal screening        Z36.2   follow-up  ---------------------------------------------------------------------- OB History  Blood Type:            Height:  5'9"   Weight (lb):  238       BMI:  35.14  Gravidity:    2         Term:   1        Prem:   0        SAB:   0  TOP:          0       Ectopic:  0        Living: 1 ---------------------------------------------------------------------- Fetal Evaluation  Num Of Fetuses:     1  Fetal Heart         72  Rate(bpm):  Cardiac Activity:   Observed  Presentation:       Cephalic  Placenta:  Anterior, above cervical os  P. Cord Insertion:  Previously seen as normal  Amniotic Fluid  AFI FV:      Subjectively within normal limits  AFI Sum(cm)     %Tile       Largest Pocket(cm)  18.9            70          8.43  RUQ(cm)       RLQ(cm)       LUQ(cm)        LLQ(cm)  8.43          2.19          5.71           2.57  Comment:    Fetal decel noted (72 bpm) with spontaneous recovery to              140bpm. ---------------------------------------------------------------------- Biophysical Evaluation  Amniotic F.V:   Pocket => 2 cm two         F. Tone:        Observed                  planes  F. Movement:    Observed                    Score:          6/8  F. Breathing:   Not Observed ---------------------------------------------------------------------- Biometry  BPD:      88.9  mm     G. Age:  36w 0d         81  %    CI:        76.05   %    70 - 86                                                          FL/HC:      22.2   %    20.1 - 22.3  HC:      323.1  mm     G. Age:  36w 4d         58  %    HC/AC:      0.96        0.93 - 1.11  AC:      337.5  mm     G. Age:  37w 5d       > 97  %    FL/BPD:     80.5   %    71 - 87  FL:       71.6  mm     G. Age:  36w 5d         85  %    FL/AC:      21.2   %    20 - 24  Est. FW:    3115  gm    6 lb 14 oz    > 90  % ---------------------------------------------------------------------- Gestational Age  U/S Today:     36w 5d                                        EDD:   09/27/17  Best:  34w 6d     Det. ByMarcella Dubs         EDD:   10/10/17                                      (03/01/17) ---------------------------------------------------------------------- Anatomy  Cranium:               Appears normal         Aortic Arch:            Previously seen  Cavum:                 Previously seen        Ductal Arch:            Previously seen  Ventricles:            Appears normal         Diaphragm:              Previously seen  Choroid Plexus:        Previously seen        Stomach:                Appears normal, left                                                                        sided  Cerebellum:            Previously seen        Abdomen:                Appears normal  Posterior Fossa:       Previously seen        Abdominal Wall:         Previously seen  Nuchal Fold:           Not applicable (>20    Cord Vessels:           Previously seen                         wks GA)  Face:                  Orbits previously      Kidneys:                Appear normal                         seen; profile nws  Lips:                  Previously seen        Bladder:                Appears  normal  Thoracic:              Appears normal         Spine:                  Previously seen  Heart:  Previously seen        Upper Extremities:      Previously seen  RVOT:                  Previously seen        Lower Extremities:      Previously seen  LVOT:                  Previously seen  Other:  Female gender previously visualized. Technically difficult due to          maternal habitus and fetal position. ---------------------------------------------------------------------- Cervix Uterus Adnexa  Cervix  Not visualized (advanced GA >29wks) ---------------------------------------------------------------------- Impression  Singleton intrauterine pregnancy at 34+6 weeks with GDM  here for growth and BPP  Interval review of the anatomy shows no sonographic  markers for aneuploidy or structural anomalies  All relevant fetal anatomy has been visualized  Amniotic fluid volume is normal  Estimated fetal weight shows growth at >90th percentile  BPP 6/8 with no fetal breathing  FHR deceleration to 70s noted ---------------------------------------------------------------------- Recommendations  To MAU for evaluation ----------------------------------------------------------------------                 Charlsie Merles, MD Electronically Signed Final Report   09/04/2017 04:53 pm ----------------------------------------------------------------------   Assessment and Plan:  Pregnancy: G2P1001 at [redacted]w[redacted]d  1. GDM, class A2 Has not been taking Glyburide as instructed due to low blood sugars at night.  Had some fastings in 100-110s. Emphasized adherence to regimen, no changes made to Glyburide 5 mg po bid. Concerned about possible macrosomia and TOLAC, repeat scan ordered at 38 weeks. Had BPP 8/10 in MFM and MAU yesterday. Continue antenatal testing.  - [place] MFM OB FOLLOW UP; Future - [place] MFM FETAL BPP WO NON STRESS; Future  2. Hx of preeclampsia, prior pregnancy, currently pregnant Normal BP, continue ASA.  3.  Supervision of high risk pregnancy, antepartum Preterm labor symptoms and general obstetric precautions including but not limited to vaginal bleeding, contractions, leaking of fluid and fetal movement were reviewed in detail with the patient. BTL papers signed today. Please refer to After Visit Summary for other counseling recommendations.  Return for Tuesday and Thursday (twice a week testing) next week and pelvic cultures.   Jaynie Collins, MD

## 2017-09-05 NOTE — Telephone Encounter (Signed)
Left voicemail on cell phone informing patient of US appointment at MFM on 09/26/17 @ 8:45. Provided phone number if need to reschedule.

## 2017-09-06 ENCOUNTER — Encounter: Payer: Self-pay | Admitting: *Deleted

## 2017-09-09 ENCOUNTER — Telehealth: Payer: Self-pay | Admitting: *Deleted

## 2017-09-09 MED ORDER — OSELTAMIVIR PHOSPHATE 75 MG PO CAPS: 75.0000 mg | ORAL_CAPSULE | Freq: Every day | ORAL | 0 refills | Status: DC

## 2017-09-09 NOTE — Telephone Encounter (Signed)
-----   Message from Lindell SparHeather L Bacon, [place] sent at 09/09/2017  8:12 AM EST ----- Regarding: pt requesting medication Contact: [phone] Patients daughter was dx with the flu and was wanting to know if she could get something sent to pharmacy to prevent her getting it.  [facility] [address]

## 2017-09-09 NOTE — Telephone Encounter (Signed)
Sent Tamiflu to pharmacy. Daughter confirmed flu on Saturday.

## 2017-09-10 ENCOUNTER — Ambulatory Visit (INDEPENDENT_AMBULATORY_CARE_PROVIDER_SITE_OTHER): Payer: Medicaid Other | Admitting: *Deleted

## 2017-09-10 ENCOUNTER — Encounter: Payer: Self-pay | Admitting: *Deleted

## 2017-09-10 VITALS — BP 134/83 | HR 72

## 2017-09-10 DIAGNOSIS — O24419 Gestational diabetes mellitus in pregnancy, unspecified control: Secondary | ICD-10-CM | POA: Diagnosis not present

## 2017-09-11 ENCOUNTER — Other Ambulatory Visit: Payer: Medicaid Other

## 2017-09-12 ENCOUNTER — Ambulatory Visit (INDEPENDENT_AMBULATORY_CARE_PROVIDER_SITE_OTHER): Payer: Medicaid Other | Admitting: Obstetrics & Gynecology

## 2017-09-12 ENCOUNTER — Other Ambulatory Visit (HOSPITAL_COMMUNITY)
Admission: RE | Admit: 2017-09-12 | Discharge: 2017-09-12 | Disposition: A | Discharge status: Home / Self Care | Payer: Medicaid Other | Source: Ambulatory Visit | Attending: Obstetrics & Gynecology | Admitting: Obstetrics & Gynecology

## 2017-09-12 ENCOUNTER — Other Ambulatory Visit: Payer: Medicaid Other

## 2017-09-12 VITALS — BP 117/75 | HR 103 | Wt 250.0 lb

## 2017-09-12 DIAGNOSIS — O3663X0 Maternal care for excessive fetal growth, third trimester, not applicable or unspecified: Secondary | ICD-10-CM | POA: Insufficient documentation

## 2017-09-12 DIAGNOSIS — O99210 Obesity complicating pregnancy, unspecified trimester: Secondary | ICD-10-CM

## 2017-09-12 DIAGNOSIS — O24419 Gestational diabetes mellitus in pregnancy, unspecified control: Secondary | ICD-10-CM

## 2017-09-12 DIAGNOSIS — O09299 Supervision of pregnancy with other poor reproductive or obstetric history, unspecified trimester: Secondary | ICD-10-CM

## 2017-09-12 DIAGNOSIS — O34219 Maternal care for unspecified type scar from previous cesarean delivery: Secondary | ICD-10-CM

## 2017-09-12 DIAGNOSIS — Z8632 Personal history of gestational diabetes: Secondary | ICD-10-CM

## 2017-09-12 DIAGNOSIS — O09293 Supervision of pregnancy with other poor reproductive or obstetric history, third trimester: Secondary | ICD-10-CM

## 2017-09-12 DIAGNOSIS — O99213 Obesity complicating pregnancy, third trimester: Secondary | ICD-10-CM | POA: Insufficient documentation

## 2017-09-12 DIAGNOSIS — Z3A36 36 weeks gestation of pregnancy: Secondary | ICD-10-CM | POA: Diagnosis not present

## 2017-09-12 DIAGNOSIS — O0990 Supervision of high risk pregnancy, unspecified, unspecified trimester: Secondary | ICD-10-CM

## 2017-09-12 DIAGNOSIS — O0993 Supervision of high risk pregnancy, unspecified, third trimester: Secondary | ICD-10-CM | POA: Diagnosis not present

## 2017-09-12 DIAGNOSIS — O3443 Maternal care for other abnormalities of cervix, third trimester: Secondary | ICD-10-CM

## 2017-09-12 DIAGNOSIS — Z9889 Other specified postprocedural states: Secondary | ICD-10-CM

## 2017-09-12 DIAGNOSIS — E669 Obesity, unspecified: Secondary | ICD-10-CM | POA: Diagnosis not present

## 2017-09-12 DIAGNOSIS — O3660X0 Maternal care for excessive fetal growth, unspecified trimester, not applicable or unspecified: Secondary | ICD-10-CM

## 2017-09-12 DIAGNOSIS — O3440 Maternal care for other abnormalities of cervix, unspecified trimester: Secondary | ICD-10-CM

## 2017-09-12 MED ORDER — GLYBURIDE 5 MG PO TABS: ORAL_TABLET | ORAL | 0 refills | Status: DC

## 2017-09-12 NOTE — Progress Notes (Signed)
   PRENATAL VISIT NOTE  Subjective:  Julia Kline is a 32 y.o. G2P1001 at 9756w0d being seen today for ongoing prenatal care.  She is currently monitored for the following issues for this  High risk pregnancy and has Supervision of high risk pregnancy, antepartum; Previous cesarean delivery affecting pregnancy, antepartum; Previous gestational diabetes mellitus, antepartum; Hx of preeclampsia, prior pregnancy, currently pregnant; Hx LEEP (loop electrosurgical excision procedure), cervix, pregnancy; H/O unilateral oophorectomy; GDM, class A2; Obesity affecting pregnancy, antepartum; Obesity (BMI 30-39.9); and LGA (large for gestational age) fetus affecting management of mother on their problem list.  Patient reports no complaints. She reports that she has not been checking her glucose reg because her daugheth has been ill. She report +FM, no ctx or LOF. Denies leaking of fluid.   The following portions of the patient's history were reviewed and updated as appropriate: allergies, current medications, past family history, past medical history, past social history, past surgical history and problem list. Problem list updated.  Objective:   Vitals:   09/12/17 1006  BP: 117/75  Pulse: (!) 103  Weight: 250 lb (113.4 kg)    Fetal Status: Fetal Heart Rate (bpm): NST   Movement: Present  Presentation: Vertex  General:  Alert, oriented and cooperative. Patient is in no acute distress.  Skin: Skin is warm and dry. No rash noted.   Cardiovascular: Normal heart rate noted  Respiratory: Normal respiratory effort, no problems with respiration noted  Abdomen: Soft, gravid, appropriate for gestational age.  Pain/Pressure: Present     Pelvic: Cervical exam performed        Extremities: Normal range of motion.  Edema: Trace  Mental Status:  Normal mood and affect. Normal behavior. Normal judgment and thought content.   Assessment and Plan:  Pregnancy: G2P1001 at 4556w0d  1. Supervision of high risk  pregnancy, antepartum - US bedside; Future - Fetal nonstress test  2. Previous gestational diabetes mellitus, antepartum  3. Previous cesarean delivery affecting pregnancy, antepartum Signed TOLAC consent 09/02/2017  4. Obesity affecting pregnancy, antepartum  5. Excessive fetal growth affecting management of pregnancy, antepartum, single or unspecified fetus  6. Hx of preeclampsia, prior pregnancy, currently pregnant  7. History of loop electrosurgical excision procedure (LEEP) of cervix affecting pregnancy, antepartum  8. GDM, class A2 99% of all glucose levels were abnormal. All of pts fasting glucose were >90. [phone](99-2120's)  glyburide increased to from 5mg  bid to 7.5mg  bid  NST reviewed and reactive.  Preterm labor symptoms and general obstetric precautions including but not limited to vaginal bleeding, contractions, leaking of fluid and fetal movement were reviewed in detail with the patient. Please refer to After Visit Summary for other counseling recommendations.  Return in about 1 week (around 09/19/2017).   Willodean Rosenthalarolyn Harraway-Smith, MD

## 2017-09-13 ENCOUNTER — Telehealth: Payer: Self-pay

## 2017-09-13 ENCOUNTER — Encounter: Payer: Medicaid Other | Admitting: Obstetrics & Gynecology

## 2017-09-13 LAB — CERVICOVAGINAL ANCILLARY ONLY
Bacterial vaginitis: NEGATIVE
Candida vaginitis: NEGATIVE
Chlamydia: NEGATIVE
Neisseria Gonorrhea: NEGATIVE
Trichomonas: NEGATIVE

## 2017-09-13 NOTE — Telephone Encounter (Signed)
Received high glucose reading from baby scripts regarding patient. Called patient she reports blood sugar has been dropping down to low.. She reports blood sugar reading yesterday around 3:00pm was 59 and last night it was 49. She is concern that she maybe on too much medications. Spoke with Dr.Anyanwu who suggested patient go back down to glyburide 5 mg bid and to continue to monitor her blood sugars/symptons doing the day. Patient voice understanding at this time.

## 2017-09-14 LAB — STREP GP B NAA: Strep Gp B NAA: POSITIVE — AB

## 2017-09-17 ENCOUNTER — Ambulatory Visit: Payer: Medicaid Other

## 2017-09-17 VITALS — BP 120/90 | HR 100

## 2017-09-17 DIAGNOSIS — O24415 Gestational diabetes mellitus in pregnancy, controlled by oral hypoglycemic drugs: Secondary | ICD-10-CM

## 2017-09-19 ENCOUNTER — Encounter (HOSPITAL_COMMUNITY): Payer: Self-pay | Admitting: *Deleted

## 2017-09-19 ENCOUNTER — Telehealth (HOSPITAL_COMMUNITY): Payer: Self-pay | Admitting: *Deleted

## 2017-09-19 ENCOUNTER — Encounter: Payer: Self-pay | Admitting: *Deleted

## 2017-09-19 ENCOUNTER — Ambulatory Visit (INDEPENDENT_AMBULATORY_CARE_PROVIDER_SITE_OTHER): Payer: Medicaid Other | Admitting: Obstetrics and Gynecology

## 2017-09-19 ENCOUNTER — Encounter: Payer: Self-pay | Admitting: Obstetrics and Gynecology

## 2017-09-19 VITALS — BP 135/81 | HR 95 | Wt 253.0 lb

## 2017-09-19 DIAGNOSIS — O0990 Supervision of high risk pregnancy, unspecified, unspecified trimester: Secondary | ICD-10-CM

## 2017-09-19 DIAGNOSIS — O3660X0 Maternal care for excessive fetal growth, unspecified trimester, not applicable or unspecified: Secondary | ICD-10-CM

## 2017-09-19 DIAGNOSIS — O0993 Supervision of high risk pregnancy, unspecified, third trimester: Secondary | ICD-10-CM

## 2017-09-19 DIAGNOSIS — O3663X0 Maternal care for excessive fetal growth, third trimester, not applicable or unspecified: Secondary | ICD-10-CM

## 2017-09-19 DIAGNOSIS — O34219 Maternal care for unspecified type scar from previous cesarean delivery: Secondary | ICD-10-CM

## 2017-09-19 DIAGNOSIS — O24419 Gestational diabetes mellitus in pregnancy, unspecified control: Secondary | ICD-10-CM

## 2017-09-19 DIAGNOSIS — E669 Obesity, unspecified: Secondary | ICD-10-CM

## 2017-09-19 MED ORDER — GLYBURIDE 2.5 MG PO TABS: ORAL_TABLET | ORAL | Status: DC

## 2017-09-19 NOTE — Progress Notes (Signed)
NST/AFI/ROB today Induction scheduled 10/03/17 at 0630

## 2017-09-19 NOTE — Progress Notes (Signed)
Prenatal Visit Note Date: 09/19/2017 Clinic: [facility]  Subjective:  Julia Kline is a 32 y.o. G2P1001 at 6946w0d being seen today for ongoing prenatal care.  She is currently monitored for the following issues for this high-risk pregnancy and has Supervision of high risk pregnancy, antepartum; Previous cesarean delivery affecting pregnancy, antepartum; Previous gestational diabetes mellitus, antepartum; Hx of preeclampsia, prior pregnancy, currently pregnant; Hx LEEP (loop electrosurgical excision procedure), cervix, pregnancy; H/O unilateral oophorectomy; GDM, class A2; Obesity affecting pregnancy, antepartum; Obesity (BMI 30-39.9); and LGA (large for gestational age) fetus affecting management of mother on their problem list.  Patient reports no complaints.   Contractions: Irregular. Vag. Bleeding: None.  Movement: Present. Denies leaking of fluid.   The following portions of the patient's history were reviewed and updated as appropriate: allergies, current medications, past family history, past medical history, past social history, past surgical history and problem list. Problem list updated.  Objective:   Vitals:   09/19/17 0909  BP: 135/81  Pulse: 95  Weight: 253 lb (114.8 kg)    Fetal Status: Fetal Heart Rate (bpm): NST   Movement: Present  Presentation: Vertex  General:  Alert, oriented and cooperative. Patient is in no acute distress.  Skin: Skin is warm and dry. No rash noted.   Cardiovascular: Normal heart rate noted  Respiratory: Normal respiratory effort, no problems with respiration noted  Abdomen: Soft, gravid, appropriate for gestational age. Pain/Pressure: Present     Pelvic:  Cervical exam deferred Dilation: Fingertip Effacement (%): 50 Station: Ballotable  Extremities: Normal range of motion.  Edema: Trace  Mental Status: Normal mood and affect. Normal behavior. Normal judgment and thought content.   Urinalysis:      Assessment and Plan:   Pregnancy: G2P1001 at 2246w0d  1. Supervision of high risk pregnancy, antepartum Routine care.  - Fetal nonstress test - US bedside; Future  2. GDM, class A2 Pt was put on glyburide7.5/7.5 after 1/24 visit but she stopped after a day or two b/c her sugars were low so she is on 3.75 with breakfast and 5 with dinner and her sugars look okay (see babyscripts tab) with her 2h pp much improved but her am fastings still somewhat high. She states her bs this morning was 83. Keep with the current dose b/c I don't want to bottom her out and will have clinic check her sugars on Monday. rNST and normal AFI, cephalic today. iol set up for 39/0  3. Previous cesarean delivery affecting pregnancy, antepartum Desires tolac. Consent signed  4. Obesity (BMI 30-39.9)  5. Excessive fetal growth affecting management of pregnancy, antepartum, single or unspecified fetus F/u 2/7 growth and bpp  Term labor symptoms and general obstetric precautions including but not limited to vaginal bleeding, contractions, leaking of fluid and fetal movement were reviewed in detail with the patient. Please refer to After Visit Summary for other counseling recommendations.  Return in about 1 week (around 09/26/2017).   Hurley BingPickens, Julia Inga, MD    2.5 tablets 1.5 and then 2 tablets

## 2017-09-19 NOTE — Telephone Encounter (Signed)
Preadmission screen  

## 2017-09-24 ENCOUNTER — Ambulatory Visit (INDEPENDENT_AMBULATORY_CARE_PROVIDER_SITE_OTHER): Payer: Medicaid Other | Admitting: Obstetrics and Gynecology

## 2017-09-24 ENCOUNTER — Encounter: Payer: Self-pay | Admitting: Obstetrics and Gynecology

## 2017-09-24 ENCOUNTER — Other Ambulatory Visit: Payer: Medicaid Other

## 2017-09-24 VITALS — BP 123/73 | HR 100 | Wt 258.0 lb

## 2017-09-24 DIAGNOSIS — O24419 Gestational diabetes mellitus in pregnancy, unspecified control: Secondary | ICD-10-CM | POA: Diagnosis not present

## 2017-09-24 NOTE — Progress Notes (Signed)
Follow up US scheduled 09/26/17

## 2017-09-24 NOTE — Progress Notes (Signed)
Prenatal Visit Note Date: 09/24/2017 Clinic: [facility]  Subjective:  Julia Kline is a 32 y.o. G2P1001 at 914w5d being seen today for ongoing prenatal care.  She is currently monitored for the following issues for this high-risk pregnancy and has Supervision of high risk pregnancy, antepartum; Previous cesarean delivery affecting pregnancy, antepartum; Previous gestational diabetes mellitus, antepartum; Hx of preeclampsia, prior pregnancy, currently pregnant; Hx LEEP (loop electrosurgical excision procedure), cervix, pregnancy; H/O unilateral oophorectomy; GDM, class A2; Obesity affecting pregnancy, antepartum; Obesity (BMI 30-39.9); and LGA (large for gestational age) fetus affecting management of mother on their problem list.  Patient reports no complaints.   Contractions: Irregular. Vag. Bleeding: None.  Movement: Present. Denies leaking of fluid.   The following portions of the patient's history were reviewed and updated as appropriate: allergies, current medications, past family history, past medical history, past social history, past surgical history and problem list. Problem list updated.  Objective:   Vitals:   09/24/17 1457  BP: 123/73  Pulse: 100  Weight: 258 lb (117 kg)    Fetal Status: Fetal Heart Rate (bpm): NST   Movement: Present  Presentation: Vertex  General:  Alert, oriented and cooperative. Patient is in no acute distress.  Skin: Skin is warm and dry. No rash noted.   Cardiovascular: Normal heart rate noted  Respiratory: Normal respiratory effort, no problems with respiration noted  Abdomen: Soft, gravid, appropriate for gestational age. Pain/Pressure: Present     Pelvic:  Cervical exam deferred        Extremities: Normal range of motion.  Edema: Trace  Mental Status: Normal mood and affect. Normal behavior. Normal judgment and thought content.   Urinalysis:      Assessment and Plan:  Pregnancy: G2P1001 at 364w5d  1. Supervision of high  risk pregnancy, antepartum Routine care.   2. GDM, class A2 Continue glyburide 3.75 with breakfast and 5 with dinner. Am fastings are in the 100s but only when she eats a late dinner. 2hr PP normal. rNST and has f/u u/s later this week. Already set up for 2/14 iol  3. Previous cesarean delivery affecting pregnancy, antepartum Desires tolac. Consent signed  4. Obesity (BMI 30-39.9)  5. Excessive fetal growth affecting management of pregnancy, antepartum, single or unspecified fetus F/u 2/7 growth and bpp  Preterm labor symptoms and general obstetric precautions including but not limited to vaginal bleeding, contractions, leaking of fluid and fetal movement were reviewed in detail with the patient. Please refer to After Visit Summary for other counseling recommendations.   RTC 1wk   Grandfather BingPickens, Xzaiver Vayda, MD

## 2017-09-26 ENCOUNTER — Other Ambulatory Visit: Payer: Self-pay | Admitting: Obstetrics & Gynecology

## 2017-09-26 ENCOUNTER — Ambulatory Visit (HOSPITAL_COMMUNITY)
Admission: RE | Admit: 2017-09-26 | Discharge: 2017-09-26 | Disposition: A | Discharge status: Home / Self Care | Payer: Medicaid Other | Source: Ambulatory Visit | Attending: Obstetrics & Gynecology | Admitting: Obstetrics & Gynecology

## 2017-09-26 ENCOUNTER — Encounter (HOSPITAL_COMMUNITY): Payer: Self-pay

## 2017-09-26 DIAGNOSIS — Z362 Encounter for other antenatal screening follow-up: Secondary | ICD-10-CM

## 2017-09-26 DIAGNOSIS — O34219 Maternal care for unspecified type scar from previous cesarean delivery: Secondary | ICD-10-CM | POA: Insufficient documentation

## 2017-09-26 DIAGNOSIS — O24415 Gestational diabetes mellitus in pregnancy, controlled by oral hypoglycemic drugs: Secondary | ICD-10-CM | POA: Diagnosis not present

## 2017-09-26 DIAGNOSIS — Z3A38 38 weeks gestation of pregnancy: Secondary | ICD-10-CM | POA: Diagnosis not present

## 2017-09-26 DIAGNOSIS — O09293 Supervision of pregnancy with other poor reproductive or obstetric history, third trimester: Secondary | ICD-10-CM | POA: Insufficient documentation

## 2017-09-26 DIAGNOSIS — O99210 Obesity complicating pregnancy, unspecified trimester: Secondary | ICD-10-CM

## 2017-09-26 DIAGNOSIS — O24419 Gestational diabetes mellitus in pregnancy, unspecified control: Secondary | ICD-10-CM | POA: Diagnosis present

## 2017-09-26 DIAGNOSIS — O99213 Obesity complicating pregnancy, third trimester: Secondary | ICD-10-CM | POA: Diagnosis not present

## 2017-09-27 ENCOUNTER — Other Ambulatory Visit: Payer: Self-pay | Admitting: Advanced Practice Midwife

## 2017-10-01 ENCOUNTER — Encounter (HOSPITAL_COMMUNITY): Payer: Self-pay | Admitting: *Deleted

## 2017-10-01 ENCOUNTER — Inpatient Hospital Stay (HOSPITAL_COMMUNITY)
Admission: AD | Admit: 2017-10-01 | Discharge: 2017-10-05 | DRG: 788 | Disposition: A | Discharge status: Home / Self Care | Payer: Medicaid Other | Source: Ambulatory Visit | Attending: Obstetrics & Gynecology | Admitting: Obstetrics & Gynecology

## 2017-10-01 ENCOUNTER — Other Ambulatory Visit: Payer: Self-pay

## 2017-10-01 ENCOUNTER — Other Ambulatory Visit: Payer: Medicaid Other

## 2017-10-01 DIAGNOSIS — O133 Gestational [pregnancy-induced] hypertension without significant proteinuria, third trimester: Secondary | ICD-10-CM

## 2017-10-01 DIAGNOSIS — O99210 Obesity complicating pregnancy, unspecified trimester: Secondary | ICD-10-CM

## 2017-10-01 DIAGNOSIS — O3663X0 Maternal care for excessive fetal growth, third trimester, not applicable or unspecified: Secondary | ICD-10-CM | POA: Diagnosis present

## 2017-10-01 DIAGNOSIS — O99824 Streptococcus B carrier state complicating childbirth: Secondary | ICD-10-CM | POA: Diagnosis present

## 2017-10-01 DIAGNOSIS — O24429 Gestational diabetes mellitus in childbirth, unspecified control: Secondary | ICD-10-CM | POA: Diagnosis not present

## 2017-10-01 DIAGNOSIS — Z3A38 38 weeks gestation of pregnancy: Secondary | ICD-10-CM | POA: Diagnosis not present

## 2017-10-01 DIAGNOSIS — O134 Gestational [pregnancy-induced] hypertension without significant proteinuria, complicating childbirth: Secondary | ICD-10-CM | POA: Diagnosis present

## 2017-10-01 DIAGNOSIS — O9902 Anemia complicating childbirth: Secondary | ICD-10-CM | POA: Diagnosis present

## 2017-10-01 DIAGNOSIS — O34211 Maternal care for low transverse scar from previous cesarean delivery: Secondary | ICD-10-CM | POA: Diagnosis present

## 2017-10-01 DIAGNOSIS — E669 Obesity, unspecified: Secondary | ICD-10-CM | POA: Diagnosis present

## 2017-10-01 DIAGNOSIS — Z8632 Personal history of gestational diabetes: Secondary | ICD-10-CM

## 2017-10-01 DIAGNOSIS — O24425 Gestational diabetes mellitus in childbirth, controlled by oral hypoglycemic drugs: Principal | ICD-10-CM | POA: Diagnosis present

## 2017-10-01 DIAGNOSIS — Z98891 History of uterine scar from previous surgery: Secondary | ICD-10-CM

## 2017-10-01 DIAGNOSIS — O339 Maternal care for disproportion, unspecified: Secondary | ICD-10-CM | POA: Diagnosis not present

## 2017-10-01 DIAGNOSIS — O34219 Maternal care for unspecified type scar from previous cesarean delivery: Secondary | ICD-10-CM

## 2017-10-01 DIAGNOSIS — O0990 Supervision of high risk pregnancy, unspecified, unspecified trimester: Secondary | ICD-10-CM

## 2017-10-01 DIAGNOSIS — O3440 Maternal care for other abnormalities of cervix, unspecified trimester: Secondary | ICD-10-CM

## 2017-10-01 DIAGNOSIS — O99214 Obesity complicating childbirth: Secondary | ICD-10-CM | POA: Diagnosis present

## 2017-10-01 DIAGNOSIS — D649 Anemia, unspecified: Secondary | ICD-10-CM | POA: Diagnosis present

## 2017-10-01 DIAGNOSIS — O24419 Gestational diabetes mellitus in pregnancy, unspecified control: Secondary | ICD-10-CM | POA: Diagnosis present

## 2017-10-01 DIAGNOSIS — O09299 Supervision of pregnancy with other poor reproductive or obstetric history, unspecified trimester: Secondary | ICD-10-CM

## 2017-10-01 DIAGNOSIS — Z9889 Other specified postprocedural states: Secondary | ICD-10-CM

## 2017-10-01 DIAGNOSIS — O139 Gestational [pregnancy-induced] hypertension without significant proteinuria, unspecified trimester: Secondary | ICD-10-CM | POA: Diagnosis present

## 2017-10-01 LAB — CBC
HCT: 33.4 % — ABNORMAL LOW (ref 36.0–46.0)
Hemoglobin: 10.9 g/dL — ABNORMAL LOW (ref 12.0–15.0)
MCH: 22.8 pg — ABNORMAL LOW (ref 26.0–34.0)
MCHC: 32.6 g/dL (ref 30.0–36.0)
MCV: 69.7 fL — ABNORMAL LOW (ref 78.0–100.0)
Platelets: 222 10*3/uL (ref 150–400)
RBC: 4.79 MIL/uL (ref 3.87–5.11)
RDW: 14.6 % (ref 11.5–15.5)
WBC: 9.9 10*3/uL (ref 4.0–10.5)

## 2017-10-01 LAB — GLUCOSE, CAPILLARY
Glucose-Capillary: 105 mg/dL — ABNORMAL HIGH (ref 65–99)
Glucose-Capillary: 118 mg/dL — ABNORMAL HIGH (ref 65–99)
Glucose-Capillary: 124 mg/dL — ABNORMAL HIGH (ref 65–99)
Glucose-Capillary: 142 mg/dL — ABNORMAL HIGH (ref 65–99)
Glucose-Capillary: 86 mg/dL (ref 65–99)

## 2017-10-01 LAB — PROTEIN / CREATININE RATIO, URINE
Creatinine, Urine: 86 mg/dL
Protein Creatinine Ratio: 0.12 mg/mg{Cre} (ref 0.00–0.15)
Total Protein, Urine: 10 mg/dL

## 2017-10-01 LAB — COMPREHENSIVE METABOLIC PANEL
ALT: 15 U/L (ref 14–54)
AST: 15 U/L (ref 15–41)
Albumin: 2.7 g/dL — ABNORMAL LOW (ref 3.5–5.0)
Alkaline Phosphatase: 101 U/L (ref 38–126)
Anion gap: 9 (ref 5–15)
BUN: 10 mg/dL (ref 6–20)
CO2: 18 mmol/L — ABNORMAL LOW (ref 22–32)
Calcium: 8.8 mg/dL — ABNORMAL LOW (ref 8.9–10.3)
Chloride: 107 mmol/L (ref 101–111)
Creatinine, Ser: 0.52 mg/dL (ref 0.44–1.00)
GFR calc Af Amer: >60 mL/min (ref >60)
GFR calc non Af Amer: >60 mL/min (ref >60)
Glucose, Bld: 103 mg/dL — ABNORMAL HIGH (ref 65–99)
Potassium: 4 mmol/L (ref 3.5–5.1)
Sodium: 134 mmol/L — ABNORMAL LOW (ref 135–145)
Total Bilirubin: 0.4 mg/dL (ref 0.3–1.2)
Total Protein: 6.2 g/dL — ABNORMAL LOW (ref 6.5–8.1)

## 2017-10-01 LAB — TYPE AND SCREEN
ABO/RH(D): O POS
Antibody Screen: NEGATIVE

## 2017-10-01 LAB — RPR: RPR Ser Ql: NONREACTIVE

## 2017-10-01 MED ORDER — LACTATED RINGERS IV SOLN: 500.0000 mL | INTRAVENOUS | Status: DC | PRN

## 2017-10-01 MED ORDER — PENICILLIN G POT IN DEXTROSE 60000 UNIT/ML IV SOLN
3.0000 10*6.[IU] | INTRAVENOUS | Status: DC
  Administered 2017-10-01 – 2017-10-02 (×9): 3 10*6.[IU] via INTRAVENOUS
  Filled 2017-10-01 (×11): qty 50

## 2017-10-01 MED ORDER — ACETAMINOPHEN 325 MG PO TABS
650.0000 mg | ORAL_TABLET | ORAL | Status: DC | PRN
  Administered 2017-10-01: 650 mg via ORAL
  Filled 2017-10-01: qty 2

## 2017-10-01 MED ORDER — OXYTOCIN 40 UNITS IN LACTATED RINGERS INFUSION - SIMPLE MED: 2.5000 [IU]/h | INTRAVENOUS | Status: DC

## 2017-10-01 MED ORDER — LACTATED RINGERS IV SOLN
INTRAVENOUS | Status: DC
  Administered 2017-10-01 – 2017-10-02 (×3): via INTRAVENOUS

## 2017-10-01 MED ORDER — SOD CITRATE-CITRIC ACID 500-334 MG/5ML PO SOLN
30.0000 mL | ORAL | Status: DC | PRN
  Administered 2017-10-02: 30 mL via ORAL
  Filled 2017-10-01: qty 15

## 2017-10-01 MED ORDER — LIDOCAINE HCL (PF) 1 % IJ SOLN: 30.0000 mL | INTRAMUSCULAR | Status: DC | PRN

## 2017-10-01 MED ORDER — ONDANSETRON HCL 4 MG/2ML IJ SOLN: 4.0000 mg | Freq: Four times a day (QID) | INTRAMUSCULAR | Status: DC | PRN

## 2017-10-01 MED ORDER — OXYTOCIN 40 UNITS IN LACTATED RINGERS INFUSION - SIMPLE MED
1.0000 m[IU]/min | INTRAVENOUS | Status: DC
  Administered 2017-10-01: 1 m[IU]/min via INTRAVENOUS
  Administered 2017-10-02: 8 m[IU]/min via INTRAVENOUS
  Filled 2017-10-01: qty 1000

## 2017-10-01 MED ORDER — SODIUM CHLORIDE 0.9 % IV SOLN
5.0000 10*6.[IU] | Freq: Once | INTRAVENOUS | Status: AC
  Administered 2017-10-01: 5 10*6.[IU] via INTRAVENOUS
  Filled 2017-10-01: qty 5

## 2017-10-01 MED ORDER — OXYTOCIN BOLUS FROM INFUSION: 500.0000 mL | Freq: Once | INTRAVENOUS | Status: DC

## 2017-10-01 MED ORDER — DOCUSATE SODIUM 100 MG PO CAPS
100.0000 mg | ORAL_CAPSULE | Freq: Every day | ORAL | Status: DC
  Administered 2017-10-01: 100 mg via ORAL
  Filled 2017-10-01: qty 1

## 2017-10-01 NOTE — Progress Notes (Signed)
Julia Kline is a 32 y.o. G2P1001 at 4922w5d by ultrasound admitted for induction of labor due to Gestational diabetes and gHTN.  Subjective: Patient doing well. Is interested in FB placement   Objective: BP 120/71   Pulse 100   Temp 97.9 F (36.6 C) (Oral)   Resp 18   Ht 5\' 9"  (1.753 m)   Wt 118.4 kg (261 lb)   LMP 12/25/2016 (Exact Date)   BMI 38.54 kg/m  No intake/output data recorded. No intake/output data recorded.  FHT:  FHR: 130 bpm, variability: moderate,  accelerations:  Present,  decelerations:  Absent UC:   irregular, every 1-5 minutes SVE:   Dilation: 1 Effacement (%): 50 Station: -3 Exam by:: Sade Harper,RN  Labs: Lab Results  Component Value Date   WBC 9.9 10/01/2017   HGB 10.9 (L) 10/01/2017   HCT 33.4 (L) 10/01/2017   MCV 69.7 (L) 10/01/2017   PLT 222 10/01/2017    Assessment / Plan: IOL for GHTN and A2GDM (on glyburide)  Labor: Pit @ 623miliunits/min, will likely place FB when cervix dilates Preeclampsia:  labs pending, last BP 120/71 Fetal Wellbeing:  Category I Pain Control:  per patient request I/D:  PCN Anticipated MOD: VBAC  Megann Easterwood,DO PGY-1 10/01/2017, 9:33 AM

## 2017-10-01 NOTE — Anesthesia Pain Management Evaluation Note (Signed)
  CRNA Pain Management Visit Note  Patient: Julia Kline, 32 y.o., female  "Hello I am a [facility] the [facility]. We have an anesthesia team available at all times to provide care throughout the hospital, including epidural management and anesthesia for C-section. I don't know your plan for the delivery whether it a natural birth, water birth, IV sedation, nitrous supplementation, doula or epidural, but we want to meet your pain goals."   1.Was your pain managed to your expectations on prior hospitalizations?   Yes   2.What is your expectation for pain management during this hospitalization?     Epidural  3.How can we help you reach that goal? unsure  Record the patient's initial score and the patient's pain goal.   Pain: 4  Pain Goal: 10 [facility] wants you to be able to say your pain was always managed very well.  Cephus ShellingBURGER,Meric Joye 10/01/2017

## 2017-10-01 NOTE — Progress Notes (Signed)
Patient ID: Julia Kline, female   DOB: 1985/12/03, 32 y.o.   MRN: [id number] Julia Kline is a 32 y.o. G2P1001 at 1280w5d admitted for induction of labor due to [facility] Ii LPGHTN, A2DM.  Subjective: Comfortable, no complaints. Denies ha, visual changes, ruq/epigastric pain, n/v.    Objective: BP (!) 122/54   Pulse 95   Temp 97.8 F (36.6 C) (Oral)   Resp 17   Ht 5\' 9"  (1.753 m)   Wt 118.4 kg (261 lb)   LMP 12/25/2016 (Exact Date)   BMI 38.54 kg/m  No intake/output data recorded.  FHT:  FHR: 135 bpm, variability: moderate,  accelerations:  Present,  decelerations:  Absent UC:   irregular  SVE:  Cl/50/-3, soft, cx to maternal Lt Placed in lithotomy/foot pedals, on bedpan, used graves speculum to visualize cx, Cooks catheter introduced through cervix with ring forceps, and uterine balloon inflated w/ 60cc of fluid. Placement confirmed w/ digital exam  Pitocin @ 6 mu/min  CBG (last 3)  Recent Labs    10/01/17 1105 10/01/17 1554 10/01/17 2041  GLUCAP 86 124* 118*     Labs: Lab Results  Component Value Date   WBC 9.9 10/01/2017   HGB 10.9 (L) 10/01/2017   HCT 33.4 (L) 10/01/2017   MCV 69.7 (L) 10/01/2017   PLT 222 10/01/2017    Assessment / Plan: IOL d/t GHTN, A2DM, on pitocin of 446mu/min, cervical foley bulb now in- will plan to increase pitocin per protocol once foley bulb out  Labor: cervical ripening phase Fetal Wellbeing:  Category I Pain Control:  n/a Pre-eclampsia: GHTN I/D:  PCN for GBS+ Anticipated MOD:  VBAC  Cheral MarkerKimberly R Booker CNM, WHNP-BC 10/01/2017, 9:22 PM

## 2017-10-01 NOTE — Progress Notes (Signed)
Julia Kline is a 32 y.o. G2P1001 at 2112w5d by ultrasound admitted for induction of labor due to Gestational diabetes and gHTN.  Subjective: Patient doing well. No questions. States she can speak through contractions   Objective: BP 105/76   Pulse (!) 114   Temp 98.2 F (36.8 C) (Axillary)   Resp 16   Ht 5\' 9"  (1.753 m)   Wt 118.4 kg (261 lb)   LMP 12/25/2016 (Exact Date)   BMI 38.54 kg/m  No intake/output data recorded. No intake/output data recorded.  FHT:  FHR: 135 bpm, variability: moderate,  accelerations:  Present,  decelerations:  Absent UC:   irregular SVE:   Dilation: Closed Effacement (%): 50 Station: -3 Exam by:: Philipp DeputyKim Shaw, CNM  Labs: Lab Results  Component Value Date   WBC 9.9 10/01/2017   HGB 10.9 (L) 10/01/2017   HCT 33.4 (L) 10/01/2017   MCV 69.7 (L) 10/01/2017   PLT 222 10/01/2017    Assessment / Plan: IOL for GHTN and A2GDM (on glyburide)  Labor: Pit @ 526miliunits/min, will re-attempt FB when more dilated Preeclampsia:  Pr:Cr of 0.12, labs stable  Fetal Wellbeing:  Category I Pain Control:  per patient request I/D:  PCN Anticipated MOD: VBAC  Darcel Frane,DO PGY-1 10/01/2017, 4:23 PM

## 2017-10-01 NOTE — MAU Note (Signed)
PT  SAYS UC HURT SINCE 0220.    PNC  WITH [facility] .  VE 3 WEEKS AGO   FT.    DENIES HSV AND MRSA.   GBS-  POSITIVE .   GDM-   MEDS

## 2017-10-01 NOTE — Progress Notes (Signed)
Lenea D Pervis Hockingayton is a 32 y.o. G2P1001 at 5257w5d by ultrasound admitted for induction of labor due to Gestational diabetes and gHTN.  Subjective: Patient doing well.   Objective: BP 121/80   Pulse 93   Temp 98.2 F (36.8 C) (Oral)   Resp 16   Ht 5\' 9"  (1.753 m)   Wt 118.4 kg (261 lb)   LMP 12/25/2016 (Exact Date)   BMI 38.54 kg/m  No intake/output data recorded. No intake/output data recorded.  FHT:  FHR: 130 bpm, variability: moderate,  accelerations:  Present,  decelerations:  Absent UC:   irregular SVE:   Dilation: Closed Effacement (%): 50 Station: -3 Exam by:: Philipp DeputyKim Shaw, CNM  Labs: Lab Results  Component Value Date   WBC 9.9 10/01/2017   HGB 10.9 (L) 10/01/2017   HCT 33.4 (L) 10/01/2017   MCV 69.7 (L) 10/01/2017   PLT 222 10/01/2017    Assessment / Plan: IOL for GHTN and A2GDM (on glyburide)  Labor: Pit @ 676miliunits/min, FB placement attempted by Philipp DeputyKim Shaw, CNM and Dr. Adrian BlackwaterStinson but was unable to be placed. Will give pitocin break so patient may eat and then re-start pitocin Preeclampsia:  Pr:Cr of 0.12, labs stable  Fetal Wellbeing:  Category I Pain Control:  per patient request I/D:  PCN Anticipated MOD: VBAC  Josephine Wooldridge,DO PGY-1 10/01/2017, 1:41 PM

## 2017-10-01 NOTE — H&P (Signed)
OBSTETRIC ADMISSION HISTORY AND PHYSICAL  Julia Kline is a 32 y.o. female G2P1001 with IUP at 78w5dby [place] for induction of labor for gestational diabetes and GHTN. She reports +FMs, No LOF, no VB, no blurry vision or RUQ pain. Slight headaches and bilateral LE edema.  She plans on breast feeding. She request IUD for birth control. [facility]  Dating: By [place]--->  Estimated Date of Delivery: 10/10/17  Sono:    _0 , CWD, normal anatomy, cephalic presentation, anterior cervical lie above os, 3627g, 90% EFW, AC >97%   Prenatal History/Complications: AQ6PYPPrevious CS Obesity Hx pre-e prior pregnancy  Past Medical History: Past Medical History:  Diagnosis Date  . Eczema   . Gestational diabetes    glyburide  . Hernia cerebri (HDolliver   . Hx of preeclampsia, prior pregnancy, currently pregnant   . Hypertension   . Migraine   . PCOS (polycystic ovarian syndrome)   . Pregnancy induced hypertension   . Pregnant   . Vaginal Pap smear, abnormal     Past Surgical History: Past Surgical History:  Procedure Laterality Date  . CESAREAN SECTION  2017   Procedure: CESAREAN DELIVERY ONLY; Surgeon: KLina Sar MD; [facility] Service: [facility]  . LAPAROSCOPIC OOPHERECTOMY Left   . LEEP    . OVARIAN CYST REMOVAL    . TONSILLECTOMY    . WISDOM TOOTH EXTRACTION      Obstetrical History: OB History    Gravida Para Term Preterm AB Living   _1 0 0 1   SAB TAB Ectopic Multiple Live Births   0 0 0 0 1      Social History: Social History   Socioeconomic History  . Marital status: Married    Spouse name: None  . Number of children: None  . Years of education: None  . Highest education level: None  Social Needs  . Financial resource strain: None  . Food insecurity - worry: None  . Food insecurity - inability: None  . Transportation needs - medical: None  . Transportation needs - non-medical: None   Occupational History  . None  Tobacco Use  . Smoking status: Never Smoker  . Smokeless tobacco: Never Used  Substance and Sexual Activity  . Alcohol use: No  . Drug use: No  . Sexual activity: Yes    Birth control/protection: None    Comment: last sex was june 2018  Other Topics Concern  . None  Social History Narrative  . None    Family History: Family History  Problem Relation Age of Onset  . Diabetes Mother   . Diabetes Father   . Hypertension Maternal Grandmother     Allergies: Allergies  Allergen Reactions  . Pseudoephedrine Hcl Other (See Comments)  . Morphine And Related Rash    Medications Prior to Admission  Medication Sig Dispense Refill Last Dose  . ACCU-CHEK FASTCLIX LANCETS MISC 1 Units by Percutaneous route 4 (four) times daily. 100 each 12 Taking  . aspirin 81 MG chewable tablet Chew 81 mg by mouth daily.   Not Taking  . Blood Glucose Monitoring Suppl (ACCU-CHEK NANO SMARTVIEW) w/Device KIT 1 kit by Subdermal route as directed. Check blood sugars for fasting, and two hours after breakfast, lunch and dinner (4 checks daily) 1 kit 0 Taking  . docusate sodium (COLACE) 100 MG capsule Take 100 mg by mouth.   Taking  . fluconazole (DIFLUCAN) 150 MG  tablet Take 1 tab once, may take additional tab 3 days later if Sx persist (Patient not taking: Reported on 09/26/2017) 3 tablet 3 Not Taking  . glucose blood (ACCU-CHEK SMARTVIEW) test strip Use as instructed to check blood sugars 100 each 12 Taking  . glyBURIDE (DIABETA) 2.5 MG tablet Take 1 1/2 tablets with breakfast and 2 tablets with dinner.   09/29/2017  . hydrocortisone (ANUSOL-HC) 25 MG suppository Place 1 suppository (25 mg total) rectally 2 (two) times daily. 12 suppository 1 Taking  . hydrocortisone-pramoxine (PROCTOFOAM HC) rectal foam Place 1 applicator rectally 2 (two) times daily. 10 g 0 Taking  . oseltamivir (TAMIFLU) 75 MG capsule Take 1 capsule (75 mg total) by mouth daily. (Patient not taking: Reported  on 09/26/2017) 14 capsule 0 Not Taking  . polyethylene glycol powder (GLYCOLAX/MIRALAX) powder Take 17 g by mouth daily as needed for moderate constipation. 500 g 2 Taking  . prenatal vitamin w/FE, FA (PRENATAL 1 + 1) 27-1 MG TABS tablet Take 1 tablet by mouth daily at 12 noon.   Taking     Review of Systems   All systems reviewed and negative except as stated in HPI  Blood pressure (!) 125/93, pulse 97, temperature 98.1 F (36.7 C), temperature source Oral, resp. rate 20, height _0  (1.753 m), weight 118.4 kg (261 lb), last menstrual period 12/25/2016. General appearance: alert, cooperative, appears stated age and no distress Lungs: clear to auscultation bilaterally Heart: regular rate and rhythm Abdomen: soft, non-tender; bowel sounds normal Extremities: Homans sign is negative, no sign of DVT, 2+ pitting edema of BL LE to level of mid shin Fetal monitoringBaseline: 140 bpm, Variability: Good {> 6 bpm), Accelerations: Reactive and Decelerations: Occurs rare Uterine activityFrequency: Every 5 minutes    Prenatal labs: ABO, Rh: O/Positive/-- (08/13 0000) Antibody: Negative (08/13 0000) Rubella: Immune (08/13 0000) RPR: Non Reactive (11/30 1127)  HBsAg: Negative (08/13 0000)  HIV: Non Reactive (11/30 1127)  GBS: Positive (01/24 1015)  1 hr Glucola 204 Genetic screening  WNL Anatomy US normal  Prenatal Transfer Tool  Maternal Diabetes: Yes:  Diabetes Type:  Insulin/Medication controlled Genetic Screening: Normal Maternal Ultrasounds/Referrals: Normal Fetal Ultrasounds or other Referrals:  None Maternal Substance Abuse:  No Significant Maternal Medications:  None Significant Maternal Lab Results: None  No results found for this or any previous visit (from the past 24 hour(s)).  Patient Active Problem List   Diagnosis Date Noted  . LGA (large for gestational age) fetus affecting management of mother 09/05/2017  . Obesity (BMI 30-39.9) 08/14/2017  . Obesity affecting  pregnancy, antepartum 05/22/2017  . GDM, class A2   . Previous cesarean delivery affecting pregnancy, antepartum 05/02/2017  . Previous gestational diabetes mellitus, antepartum 05/02/2017  . Hx of preeclampsia, prior pregnancy, currently pregnant 05/02/2017  . Hx LEEP (loop electrosurgical excision procedure), cervix, pregnancy 05/02/2017  . H/O unilateral oophorectomy 05/02/2017  . Supervision of high risk pregnancy, antepartum 04/23/2017    Assessment/Plan:  Julia Kline is a 32 y.o. G2P1001 at 24w5dhere for IOL for gestational DM  #Labor: IOL, cervical ripening with Pitocin #Pain: Per pt request, will place epidural when she asks #FWB: Category  #ID:  GBS positive #MOF: Breast #MOC: IUD #Circ:  Outpatient   TJarvis Newcomer SHanover 10/01/2017, 5:48 AM  Midwife attestation: I have seen and examined this patient; I agree with above documentation in the student's note.   Donica D PHitzemanis a 32y.o. G2P1001 here for IOL for GHTN and  A2GDM  PE: Gen: calm comfortable, NAD Resp: normal effort, no distress Abd: gravid  ROS, labs, PMH reviewed  Assessment/Plan: Admit to LD Labor: IOL, Pitocin then FB FWB: Cat I ID: GBS Pos >PCN  Julianne Handler, CNM  10/01/2017, 7:24 AM

## 2017-10-02 ENCOUNTER — Inpatient Hospital Stay (HOSPITAL_COMMUNITY): Payer: Medicaid Other | Admitting: Anesthesiology

## 2017-10-02 ENCOUNTER — Encounter (HOSPITAL_COMMUNITY): Payer: Self-pay | Admitting: Anesthesiology

## 2017-10-02 ENCOUNTER — Encounter (HOSPITAL_COMMUNITY)
Admission: AD | Disposition: A | Discharge status: Home / Self Care | Payer: Self-pay | Source: Ambulatory Visit | Attending: Obstetrics and Gynecology

## 2017-10-02 LAB — CBC
HCT: 30.9 % — ABNORMAL LOW (ref 36.0–46.0)
Hemoglobin: 10 g/dL — ABNORMAL LOW (ref 12.0–15.0)
MCH: 22.9 pg — ABNORMAL LOW (ref 26.0–34.0)
MCHC: 32.4 g/dL (ref 30.0–36.0)
MCV: 70.7 fL — ABNORMAL LOW (ref 78.0–100.0)
Platelets: 203 10*3/uL (ref 150–400)
RBC: 4.37 MIL/uL (ref 3.87–5.11)
RDW: 14.8 % (ref 11.5–15.5)
WBC: 9.9 10*3/uL (ref 4.0–10.5)

## 2017-10-02 LAB — GLUCOSE, CAPILLARY
Glucose-Capillary: 97 mg/dL (ref 65–99)
Glucose-Capillary: 89 mg/dL (ref 65–99)
Glucose-Capillary: 87 mg/dL (ref 65–99)
Glucose-Capillary: 80 mg/dL (ref 65–99)

## 2017-10-02 SURGERY — Surgical Case
Anesthesia: Spinal

## 2017-10-02 MED ORDER — DEXTROSE 5 % IV SOLN: INTRAVENOUS | Status: DC | PRN

## 2017-10-02 MED ORDER — FENTANYL CITRATE (PF) 100 MCG/2ML IJ SOLN
100.0000 ug | INTRAMUSCULAR | Status: DC | PRN
  Administered 2017-10-02 (×6): 100 ug via INTRAVENOUS
  Filled 2017-10-02 (×6): qty 2

## 2017-10-02 MED ORDER — DEXTROSE 5 % IV SOLN
INTRAVENOUS | Status: AC
  Filled 2017-10-02: qty 3000

## 2017-10-02 MED ORDER — OXYTOCIN 10 UNIT/ML IJ SOLN
INTRAMUSCULAR | Status: AC
  Filled 2017-10-02: qty 4

## 2017-10-02 MED ORDER — DEXTROSE 5 % IV SOLN
3.0000 g | Freq: Once | INTRAVENOUS | Status: AC
  Administered 2017-10-02: 3 g via INTRAVENOUS
  Filled 2017-10-02: qty 3000

## 2017-10-02 MED ORDER — SODIUM CHLORIDE 0.9 % IR SOLN
Status: DC | PRN
  Administered 2017-10-02: 350 mL

## 2017-10-02 MED ORDER — PHENYLEPHRINE 8 MG IN D5W 100 ML (0.08MG/ML) PREMIX OPTIME
INJECTION | INTRAVENOUS | Status: DC | PRN
  Administered 2017-10-02: 60 ug/min via INTRAVENOUS

## 2017-10-02 MED ORDER — SCOPOLAMINE 1 MG/3DAYS TD PT72
MEDICATED_PATCH | TRANSDERMAL | Status: AC
  Filled 2017-10-02: qty 1

## 2017-10-02 MED ORDER — PHENYLEPHRINE 8 MG IN D5W 100 ML (0.08MG/ML) PREMIX OPTIME
INJECTION | INTRAVENOUS | Status: AC
  Filled 2017-10-02: qty 100

## 2017-10-02 MED ORDER — SCOPOLAMINE 1 MG/3DAYS TD PT72
MEDICATED_PATCH | TRANSDERMAL | Status: DC | PRN
  Administered 2017-10-02: 1 via TRANSDERMAL

## 2017-10-02 MED ORDER — FENTANYL CITRATE (PF) 100 MCG/2ML IJ SOLN
INTRAMUSCULAR | Status: DC | PRN
  Administered 2017-10-02: 20 ug via INTRATHECAL

## 2017-10-02 MED ORDER — BUPIVACAINE IN DEXTROSE 0.75-8.25 % IT SOLN
INTRATHECAL | Status: DC | PRN
  Administered 2017-10-02: 1.5 mL via INTRATHECAL

## 2017-10-02 MED ORDER — DEXAMETHASONE SODIUM PHOSPHATE 10 MG/ML IJ SOLN
INTRAMUSCULAR | Status: DC | PRN
  Administered 2017-10-02: 10 mg via INTRAVENOUS

## 2017-10-02 MED ORDER — DIPHENHYDRAMINE HCL 50 MG/ML IJ SOLN
INTRAMUSCULAR | Status: DC | PRN
  Administered 2017-10-02: 25 mg via INTRAVENOUS

## 2017-10-02 MED ORDER — OXYTOCIN 40 UNITS IN LACTATED RINGERS INFUSION - SIMPLE MED
1.0000 m[IU]/min | INTRAVENOUS | Status: DC
  Filled 2017-10-02: qty 1000

## 2017-10-02 MED ORDER — BUPIVACAINE-EPINEPHRINE 0.25% -1:200000 IJ SOLN
INTRAMUSCULAR | Status: DC | PRN
  Administered 2017-10-02: 30 mL

## 2017-10-02 MED ORDER — ONDANSETRON HCL 4 MG/2ML IJ SOLN
INTRAMUSCULAR | Status: AC
  Filled 2017-10-02: qty 2

## 2017-10-02 MED ORDER — FENTANYL CITRATE (PF) 100 MCG/2ML IJ SOLN
INTRAMUSCULAR | Status: AC
  Filled 2017-10-02: qty 2

## 2017-10-02 MED ORDER — DIPHENHYDRAMINE HCL 50 MG/ML IJ SOLN
INTRAMUSCULAR | Status: AC
  Filled 2017-10-02: qty 1

## 2017-10-02 MED ORDER — OXYTOCIN 10 UNIT/ML IJ SOLN
INTRAVENOUS | Status: DC | PRN
  Administered 2017-10-02: 40 [IU] via INTRAVENOUS

## 2017-10-02 MED ORDER — ONDANSETRON HCL 4 MG/2ML IJ SOLN
INTRAMUSCULAR | Status: DC | PRN
  Administered 2017-10-02: 4 mg via INTRAVENOUS

## 2017-10-02 MED ORDER — BUPIVACAINE-EPINEPHRINE (PF) 0.25% -1:200000 IJ SOLN
INTRAMUSCULAR | Status: AC
  Filled 2017-10-02: qty 30

## 2017-10-02 MED ORDER — DEXAMETHASONE SODIUM PHOSPHATE 10 MG/ML IJ SOLN
INTRAMUSCULAR | Status: AC
  Filled 2017-10-02: qty 1

## 2017-10-02 SURGICAL SUPPLY — 48 items
APL SKNCLS STERI-STRIP NONHPOA (GAUZE/BANDAGES/DRESSINGS) ×1
BENZOIN TINCTURE PRP APPL 2/3 (GAUZE/BANDAGES/DRESSINGS) ×1 IMPLANT
CHLORAPREP W/TINT 26ML (MISCELLANEOUS) ×2 IMPLANT
CLAMP CORD UMBIL (MISCELLANEOUS) IMPLANT
CLOTH BEACON ORANGE TIMEOUT ST (SAFETY) ×2 IMPLANT
DECANTER SPIKE VIAL GLASS SM (MISCELLANEOUS) ×1 IMPLANT
DRSG OPSITE POSTOP 4X10 (GAUZE/BANDAGES/DRESSINGS) ×2 IMPLANT
ELECT REM PT RETURN 9FT ADLT (ELECTROSURGICAL) ×2
ELECTRODE REM PT RTRN 9FT ADLT (ELECTROSURGICAL) ×1 IMPLANT
EXTENDER TRAXI PANNICULUS (MISCELLANEOUS) IMPLANT
EXTRACTOR VACUUM KIWI (MISCELLANEOUS) IMPLANT
GAUZE SPONGE 4X4 12PLY STRL LF (GAUZE/BANDAGES/DRESSINGS) ×2 IMPLANT
GLOVE BIO SURGEON ST LM GN SZ9 (GLOVE) ×2 IMPLANT
GLOVE BIOGEL PI IND STRL 7.0 (GLOVE) ×1 IMPLANT
GLOVE BIOGEL PI IND STRL 9 (GLOVE) ×1 IMPLANT
GLOVE BIOGEL PI INDICATOR 7.0 (GLOVE) ×1
GLOVE BIOGEL PI INDICATOR 9 (GLOVE) ×1
GOWN STRL REUS W/TWL 2XL LVL3 (GOWN DISPOSABLE) ×2 IMPLANT
GOWN STRL REUS W/TWL LRG LVL3 (GOWN DISPOSABLE) ×2 IMPLANT
HOVERMATT SINGLE USE (MISCELLANEOUS) ×1 IMPLANT
NDL HYPO 25X5/8 SAFETYGLIDE (NEEDLE) IMPLANT
NEEDLE HYPO 22GX1.5 SAFETY (NEEDLE) ×1 IMPLANT
NEEDLE HYPO 25X5/8 SAFETYGLIDE (NEEDLE) IMPLANT
NS IRRIG 1000ML POUR BTL (IV SOLUTION) ×2 IMPLANT
PACK C SECTION WH (CUSTOM PROCEDURE TRAY) ×2 IMPLANT
PAD ABD 7.5X8 STRL (GAUZE/BANDAGES/DRESSINGS) ×1 IMPLANT
PAD OB MATERNITY 4.3X12.25 (PERSONAL CARE ITEMS) ×2 IMPLANT
PENCIL SMOKE EVAC W/HOLSTER (ELECTROSURGICAL) ×2 IMPLANT
RETRACTOR TRAXI PANNICULUS (MISCELLANEOUS) IMPLANT
RTRCTR C-SECT PINK 25CM LRG (MISCELLANEOUS) IMPLANT
RTRCTR C-SECT PINK 34CM XLRG (MISCELLANEOUS) IMPLANT
STRIP CLOSURE SKIN 1/2X4 (GAUZE/BANDAGES/DRESSINGS) ×1 IMPLANT
SUT MNCRL 0 VIOLET CTX 36 (SUTURE) ×2 IMPLANT
SUT MON AB 2-0 CT1 27 (SUTURE) ×1 IMPLANT
SUT MONOCRYL 0 CTX 36 (SUTURE) ×4
SUT PLAIN 2 0 (SUTURE) ×2
SUT PLAIN ABS 2-0 CT1 27XMFL (SUTURE) IMPLANT
SUT VIC AB 0 CT1 27 (SUTURE) ×2
SUT VIC AB 0 CT1 27XBRD ANBCTR (SUTURE) ×1 IMPLANT
SUT VIC AB 2-0 CT1 27 (SUTURE) ×2
SUT VIC AB 2-0 CT1 TAPERPNT 27 (SUTURE) ×1 IMPLANT
SUT VIC AB 4-0 KS 27 (SUTURE) ×2 IMPLANT
SYR BULB IRRIGATION 50ML (SYRINGE) IMPLANT
SYR CONTROL 10ML LL (SYRINGE) ×1 IMPLANT
TOWEL OR 17X24 6PK STRL BLUE (TOWEL DISPOSABLE) ×2 IMPLANT
TRAXI PANNICULUS EXTENDER (MISCELLANEOUS) ×1
TRAXI PANNICULUS RETRACTOR (MISCELLANEOUS) ×2
TRAY FOLEY BAG SILVER LF 14FR (SET/KITS/TRAYS/PACK) ×2 IMPLANT

## 2017-10-02 NOTE — Progress Notes (Signed)
Saw and evaluated patient. G2P1 hx c/s for failed induction, also ghtn (well controlled) and a2gdm on glyburide. Here bp wnl. Category 1 tracing. Has had a bit more than 24 hours of low dose pitocin. Is now s/p foley. I had a lengthy discussion w/ patient regarding risks/benefits of TOLAC. Shared decision to progress. Given hx of uterine scar and now > 24 hours of pitocin, discussed risks of uterine rupture w/ prolonged use of uterotonics. Also discussed risks of early amniotomy, including risk of cord prolapse. Shared decision to proceed w/ amniotomy if fetal vertex engaged. Exam 3.5/50/-3, vertex engaged. Amniotomy performed, clear fluid. Will proceed with pitocin augmentation.

## 2017-10-02 NOTE — Progress Notes (Signed)
Julia Kline is a 32 y.o. G2P1001 at 4223w6d  admitted for induction of labor due to gestational HTN.  Subjective: Pt has had a good labor pattern, with Cat I fhr.   Objective:  BP 130/89   Pulse 95   Temp 98.1 F (36.7 C) (Oral)   Resp 18   Ht 5\' 9"  (1.753 m)   Wt 261 lb (118.4 kg)   LMP 12/25/2016 (Exact Date)   BMI 38.54 kg/m  No intake/output data recorded. No intake/output data recorded.  FHT:  FHR: 140 bpm, variability: moderate,  accelerations:  Present,  decelerations:  Absent UC:   regular, every 3-4 minutes SVE:   Dilation: 5 Effacement (%): 60 Station: Ballotable Exam by:: J. Kyson Kupper,MD Presenting part is difficult to access at the pelvic inlet of suspected android pelvis Labs: Lab Results  Component Value Date   WBC 9.9 10/01/2017   HGB 10.9 (L) 10/01/2017   HCT 33.4 (L) 10/01/2017   MCV 69.7 (L) 10/01/2017   PLT 222 10/01/2017    Assessment / Plan: Arrest of decent due to cephalopelvic disproportion  Labor: pitocin d/c'd Preeclampsia:   Fetal Wellbeing:  Category I Pain Control:  will use spinal I/D:  n/a Anticipated MOD:  cesarean section recommended, explained including risk, potential complications. such as bleeding, infection, or injury to adjacent organs.  Julia Kline 10/02/2017, 9:31 PM

## 2017-10-02 NOTE — Anesthesia Procedure Notes (Signed)
Spinal  Patient location during procedure: OR Start time: 10/02/2017 10:13 PM End time: 10/02/2017 10:20 PM Staffing Anesthesiologist: Marcene DuosFitzgerald, Verdelle Valtierra, MD Performed: anesthesiologist  Preanesthetic Checklist Completed: patient identified, site marked, surgical consent, pre-op evaluation, timeout performed, IV checked, risks and benefits discussed and monitors and equipment checked Spinal Block Patient position: sitting Prep: site prepped and draped and DuraPrep Patient monitoring: blood pressure, continuous pulse ox and heart rate Approach: midline Location: L4-5 Injection technique: single-shot Needle Needle type: Pencan  Needle gauge: 24 G Needle length: 9 cm Assessment Sensory level: T4

## 2017-10-02 NOTE — Progress Notes (Signed)
Julia Kline is a 32 y.o. G2P1001 at 6855w6d by early ultrasound [facility]  Subjective: Pt with intermittent cramping pain, improved with IV pain medication.  Objective: BP (!) 126/92   Pulse (!) 101   Temp 98 F (36.7 C) (Oral)   Resp 18   Ht 5\' 9"  (1.753 m)   Wt 261 lb (118.4 kg)   LMP 12/25/2016 (Exact Date)   BMI 38.54 kg/m  No intake/output data recorded. No intake/output data recorded.  FHT:  FHR: 135 bpm, variability: moderate,  accelerations:  Present,  decelerations:  Absent UC:   regular, every 3-5 minutes SVE:   1 cm with FB still in place @ 7 am per Julia Kline, CNM  Traction applied and FB still in place at this time  Pitocin on 6 milliunits/min  Labs: Lab Results  Component Value Date   WBC 9.9 10/01/2017   HGB 10.9 (L) 10/01/2017   HCT 33.4 (L) 10/01/2017   MCV 69.7 (L) 10/01/2017   PLT 222 10/01/2017    Assessment / Plan: Induction of labor due to GHTN FB in place Pitocin on low dose at 6 milliunits  Labor: Increase Pitocin 1 by 1 per orders to maximum of 12 at this time Preeclampsia:  labs stable Fetal Wellbeing:  Category I Pain Control:  IV pain meds I/D:  GBS pos on PCN Anticipated MOD:  VBAC  Julia Kline 10/02/2017, 10:07 AM

## 2017-10-02 NOTE — Anesthesia Preprocedure Evaluation (Addendum)
Anesthesia Evaluation  Patient identified by MRN, date of birth, ID band Patient awake    Reviewed: Allergy & Precautions, NPO status , Patient's Chart, lab work & pertinent test results  Airway Mallampati: III  TM Distance: >3 FB     Dental   Pulmonary neg pulmonary ROS,    breath sounds clear to auscultation       Cardiovascular hypertension,  Rhythm:Regular Rate:Normal     Neuro/Psych  Headaches,    GI/Hepatic negative GI ROS, Neg liver ROS,   Endo/Other  diabetes, Type 2  Renal/GU negative Renal ROS     Musculoskeletal   Abdominal   Peds  Hematology  (+) anemia ,   Anesthesia Other Findings   Reproductive/Obstetrics (+) Pregnancy (TOLAC)                            Lab Results  Component Value Date   WBC 9.9 10/01/2017   HGB 10.9 (L) 10/01/2017   HCT 33.4 (L) 10/01/2017   MCV 69.7 (L) 10/01/2017   PLT 222 10/01/2017   Lab Results  Component Value Date   CREATININE 0.52 10/01/2017   BUN 10 10/01/2017   NA 134 (L) 10/01/2017   K 4.0 10/01/2017   CL 107 10/01/2017   CO2 18 (L) 10/01/2017    Anesthesia Physical Anesthesia Plan  ASA: III  Anesthesia Plan: Spinal and Combined Spinal and Epidural   Post-op Pain Management:    Induction:   PONV Risk Score and Plan: 2 and Ondansetron, Treatment may vary due to age or medical condition and Scopolamine patch - Pre-op  Airway Management Planned: Natural Airway  Additional Equipment:   Intra-op Plan:   Post-operative Plan:   Informed Consent: I have reviewed the patients History and Physical, chart, labs and discussed the procedure including the risks, benefits and alternatives for the proposed anesthesia with the patient or authorized representative who has indicated his/her understanding and acceptance.     Plan Discussed with: CRNA  Anesthesia Plan Comments:        Anesthesia Quick Evaluation

## 2017-10-02 NOTE — Progress Notes (Signed)
Breland D Pervis Hockingayton is a 32 y.o. G2P1001 at 16109w6d by ultrasound [facility]  Subjective:   Objective: BP 124/81   Pulse 92   Temp 98.1 F (36.7 C) (Oral)   Resp 18   Ht 5\' 9"  (1.753 m)   Wt 261 lb (118.4 kg)   LMP 12/25/2016 (Exact Date)   BMI 38.54 kg/m  No intake/output data recorded. No intake/output data recorded.  FHT:  FHR: 135 bpm, variability: moderate,  accelerations:  Present,  decelerations:  Absent UC:   regular, every 2-3 minutes, mild to palpation SVE:   Dilation: 4.5 Effacement (%): 50 Station: Ballotable Exam by:: Misty StanleyLisa, CNM Foley bulb found to be in vagina, out of cervix during exam, removed easily  Labs: Lab Results  Component Value Date   WBC 9.9 10/01/2017   HGB 10.9 (L) 10/01/2017   HCT 33.4 (L) 10/01/2017   MCV 69.7 (L) 10/01/2017   PLT 222 10/01/2017    Assessment / Plan: Induction of labor due to GHTN FB out, on low dose Pit  Labor: Progressing normally. Pitocin off for short break, pt to wash/shower, walk then resume Pitocin at 732milliunits/increase by 2 per protocol Preeclampsia:  labs stable Fetal Wellbeing:  Category I Pain Control:  IV pain meds I/D:  GBS pos on PCN Anticipated MOD:  VBAC  Sharen CounterLisa Leftwich-Kirby 10/02/2017, 2:12 PM

## 2017-10-02 NOTE — Progress Notes (Signed)
Julia Kline is a 32 y.o. G2P1001 at 6829w6d [facility]  Subjective: Pt doing well, feeling intermittent mild cramping.  S/O in room for support.  Objective: BP (!) 136/95   Pulse (!) 101   Temp 98.1 F (36.7 C) (Oral)   Resp 18   Ht 5\' 9"  (1.753 m)   Wt 261 lb (118.4 kg)   LMP 12/25/2016 (Exact Date)   BMI 38.54 kg/m  No intake/output data recorded. No intake/output data recorded.  FHT:  FHR: 135 bpm, variability: moderate,  accelerations:  Present,  decelerations:  Absent UC:   Every 2-3 minutes SVE:   Dilation: 5 Effacement (%): 60 Station: Ballotable Exam by:: Misty StanleyLisa, CNM AROM of forebag, light meconium noted IUPC placed without difficulty, pt tolerated well  Labs: Lab Results  Component Value Date   WBC 9.9 10/01/2017   HGB 10.9 (L) 10/01/2017   HCT 33.4 (L) 10/01/2017   MCV 69.7 (L) 10/01/2017   PLT 222 10/01/2017    Assessment / Plan: Induction of labor due to gestational hypertension,  progressing well on pitocin  Labor: Slight labor progress since earlier AROM, contractions every 2-3 min so RN unable to adjust Pitocin.  AROM of forebag and IUPC placed to better adjust Pitocin.  Pt may have epidural when desired. Preeclampsia:  labs stable Fetal Wellbeing:  Category I Pain Control:  IV pain meds I/D:  GBS positive, PCN Anticipated MOD:  VBAC  Sharen CounterLisa Leftwich-Kirby 10/02/2017, 7:27 PM

## 2017-10-03 ENCOUNTER — Encounter (HOSPITAL_COMMUNITY): Payer: Self-pay

## 2017-10-03 ENCOUNTER — Inpatient Hospital Stay (HOSPITAL_COMMUNITY): Payer: Medicaid Other

## 2017-10-03 DIAGNOSIS — O339 Maternal care for disproportion, unspecified: Secondary | ICD-10-CM

## 2017-10-03 DIAGNOSIS — O99824 Streptococcus B carrier state complicating childbirth: Secondary | ICD-10-CM

## 2017-10-03 DIAGNOSIS — O34211 Maternal care for low transverse scar from previous cesarean delivery: Secondary | ICD-10-CM

## 2017-10-03 DIAGNOSIS — O24429 Gestational diabetes mellitus in childbirth, unspecified control: Secondary | ICD-10-CM

## 2017-10-03 DIAGNOSIS — Z3A38 38 weeks gestation of pregnancy: Secondary | ICD-10-CM

## 2017-10-03 DIAGNOSIS — O134 Gestational [pregnancy-induced] hypertension without significant proteinuria, complicating childbirth: Secondary | ICD-10-CM

## 2017-10-03 LAB — CBC
HCT: 31.8 % — ABNORMAL LOW (ref 36.0–46.0)
HCT: 29.4 % — ABNORMAL LOW (ref 36.0–46.0)
Hemoglobin: 10.2 g/dL — ABNORMAL LOW (ref 12.0–15.0)
Hemoglobin: 9.4 g/dL — ABNORMAL LOW (ref 12.0–15.0)
MCH: 22.4 pg — ABNORMAL LOW (ref 26.0–34.0)
MCH: 22.5 pg — ABNORMAL LOW (ref 26.0–34.0)
MCHC: 32.1 g/dL (ref 30.0–36.0)
MCHC: 32 g/dL (ref 30.0–36.0)
MCV: 69.7 fL — ABNORMAL LOW (ref 78.0–100.0)
MCV: 70.3 fL — ABNORMAL LOW (ref 78.0–100.0)
Platelets: 213 10*3/uL (ref 150–400)
Platelets: 217 10*3/uL (ref 150–400)
RBC: 4.56 MIL/uL (ref 3.87–5.11)
RBC: 4.18 MIL/uL (ref 3.87–5.11)
RDW: 14.9 % (ref 11.5–15.5)
RDW: 14.8 % (ref 11.5–15.5)
WBC: 16.7 10*3/uL — ABNORMAL HIGH (ref 4.0–10.5)
WBC: 13.7 10*3/uL — ABNORMAL HIGH (ref 4.0–10.5)

## 2017-10-03 LAB — GLUCOSE, CAPILLARY
Glucose-Capillary: 75 mg/dL (ref 65–99)
Glucose-Capillary: 96 mg/dL (ref 65–99)
Glucose-Capillary: 144 mg/dL — ABNORMAL HIGH (ref 65–99)
Glucose-Capillary: 99 mg/dL (ref 65–99)
Glucose-Capillary: 150 mg/dL — ABNORMAL HIGH (ref 65–99)

## 2017-10-03 MED ORDER — SENNOSIDES-DOCUSATE SODIUM 8.6-50 MG PO TABS
2.0000 | ORAL_TABLET | ORAL | Status: DC
  Administered 2017-10-03 – 2017-10-04 (×2): 2 via ORAL
  Filled 2017-10-03 (×2): qty 2

## 2017-10-03 MED ORDER — NALOXONE HCL 0.4 MG/ML IJ SOLN: 0.4000 mg | INTRAMUSCULAR | Status: DC | PRN

## 2017-10-03 MED ORDER — SIMETHICONE 80 MG PO CHEW
80.0000 mg | CHEWABLE_TABLET | Freq: Three times a day (TID) | ORAL | Status: DC
  Administered 2017-10-03 – 2017-10-05 (×7): 80 mg via ORAL
  Filled 2017-10-03 (×6): qty 1

## 2017-10-03 MED ORDER — DIPHENHYDRAMINE HCL 50 MG/ML IJ SOLN: 12.5000 mg | INTRAMUSCULAR | Status: DC | PRN

## 2017-10-03 MED ORDER — KETOROLAC TROMETHAMINE 30 MG/ML IJ SOLN: 30.0000 mg | Freq: Four times a day (QID) | INTRAMUSCULAR | Status: DC | PRN

## 2017-10-03 MED ORDER — NALBUPHINE HCL 10 MG/ML IJ SOLN: 5.0000 mg | Freq: Once | INTRAMUSCULAR | Status: DC | PRN

## 2017-10-03 MED ORDER — SODIUM CHLORIDE 0.9% FLUSH: 3.0000 mL | INTRAVENOUS | Status: DC | PRN

## 2017-10-03 MED ORDER — SIMETHICONE 80 MG PO CHEW
80.0000 mg | CHEWABLE_TABLET | ORAL | Status: DC
  Administered 2017-10-03 – 2017-10-04 (×2): 80 mg via ORAL
  Filled 2017-10-03 (×2): qty 1

## 2017-10-03 MED ORDER — NALOXONE HCL 4 MG/10ML IJ SOLN: 1.0000 ug/kg/h | INTRAVENOUS | Status: DC | PRN

## 2017-10-03 MED ORDER — LACTATED RINGERS IV SOLN
INTRAVENOUS | Status: DC
  Administered 2017-10-03: 09:00:00 via INTRAVENOUS

## 2017-10-03 MED ORDER — OXYTOCIN 40 UNITS IN LACTATED RINGERS INFUSION - SIMPLE MED: 2.5000 [IU]/h | INTRAVENOUS | Status: AC

## 2017-10-03 MED ORDER — NALBUPHINE HCL 10 MG/ML IJ SOLN: 5.0000 mg | INTRAMUSCULAR | Status: DC | PRN

## 2017-10-03 MED ORDER — ENOXAPARIN SODIUM 60 MG/0.6ML ~~LOC~~ SOLN
0.5000 mg/kg | SUBCUTANEOUS | Status: DC
  Administered 2017-10-05: 60 mg via SUBCUTANEOUS
  Filled 2017-10-03 (×3): qty 0.6

## 2017-10-03 MED ORDER — ACETAMINOPHEN 500 MG PO TABS
1000.0000 mg | ORAL_TABLET | Freq: Four times a day (QID) | ORAL | Status: AC
  Administered 2017-10-03 (×2): 1000 mg via ORAL
  Filled 2017-10-03 (×3): qty 2

## 2017-10-03 MED ORDER — IBUPROFEN 600 MG PO TABS
600.0000 mg | ORAL_TABLET | Freq: Four times a day (QID) | ORAL | Status: DC
  Administered 2017-10-03 – 2017-10-05 (×9): 600 mg via ORAL
  Filled 2017-10-03 (×10): qty 1

## 2017-10-03 MED ORDER — TETANUS-DIPHTH-ACELL PERTUSSIS 5-2.5-18.5 LF-MCG/0.5 IM SUSP: 0.5000 mL | Freq: Once | INTRAMUSCULAR | Status: DC

## 2017-10-03 MED ORDER — DIBUCAINE 1 % RE OINT: 1.0000 "application " | TOPICAL_OINTMENT | RECTAL | Status: DC | PRN

## 2017-10-03 MED ORDER — OXYCODONE-ACETAMINOPHEN 5-325 MG PO TABS
1.0000 | ORAL_TABLET | Freq: Four times a day (QID) | ORAL | Status: DC | PRN
  Administered 2017-10-03 (×3): 2 via ORAL
  Administered 2017-10-04 – 2017-10-05 (×5): 1 via ORAL
  Filled 2017-10-03: qty 1
  Filled 2017-10-03: qty 2
  Filled 2017-10-03: qty 1
  Filled 2017-10-03: qty 2
  Filled 2017-10-03: qty 1
  Filled 2017-10-03: qty 2
  Filled 2017-10-03: qty 1
  Filled 2017-10-03: qty 2
  Filled 2017-10-03: qty 1

## 2017-10-03 MED ORDER — DIPHENHYDRAMINE HCL 25 MG PO CAPS: 25.0000 mg | ORAL_CAPSULE | Freq: Four times a day (QID) | ORAL | Status: DC | PRN

## 2017-10-03 MED ORDER — KETOROLAC TROMETHAMINE 30 MG/ML IJ SOLN
INTRAMUSCULAR | Status: AC
  Administered 2017-10-03: 30 mg via INTRAMUSCULAR
  Filled 2017-10-03: qty 1

## 2017-10-03 MED ORDER — ENOXAPARIN SODIUM 40 MG/0.4ML ~~LOC~~ SOLN: 40.0000 mg | SUBCUTANEOUS | Status: DC

## 2017-10-03 MED ORDER — FENTANYL CITRATE (PF) 100 MCG/2ML IJ SOLN: 25.0000 ug | INTRAMUSCULAR | Status: DC | PRN

## 2017-10-03 MED ORDER — WITCH HAZEL-GLYCERIN EX PADS: 1.0000 "application " | MEDICATED_PAD | CUTANEOUS | Status: DC | PRN

## 2017-10-03 MED ORDER — ZOLPIDEM TARTRATE 5 MG PO TABS: 5.0000 mg | ORAL_TABLET | Freq: Every evening | ORAL | Status: DC | PRN

## 2017-10-03 MED ORDER — SCOPOLAMINE 1 MG/3DAYS TD PT72: 1.0000 | MEDICATED_PATCH | Freq: Once | TRANSDERMAL | Status: DC

## 2017-10-03 MED ORDER — PRENATAL MULTIVITAMIN CH
1.0000 | ORAL_TABLET | Freq: Every day | ORAL | Status: DC
  Administered 2017-10-03 – 2017-10-05 (×3): 1 via ORAL
  Filled 2017-10-03 (×3): qty 1

## 2017-10-03 MED ORDER — DIPHENHYDRAMINE HCL 25 MG PO CAPS
25.0000 mg | ORAL_CAPSULE | ORAL | Status: DC | PRN
  Filled 2017-10-03: qty 1

## 2017-10-03 MED ORDER — ACETAMINOPHEN 325 MG PO TABS: 650.0000 mg | ORAL_TABLET | ORAL | Status: DC | PRN

## 2017-10-03 MED ORDER — KETOROLAC TROMETHAMINE 30 MG/ML IJ SOLN
30.0000 mg | Freq: Four times a day (QID) | INTRAMUSCULAR | Status: DC | PRN
  Administered 2017-10-03: 30 mg via INTRAMUSCULAR

## 2017-10-03 MED ORDER — COCONUT OIL OIL: 1.0000 "application " | TOPICAL_OIL | Status: DC | PRN

## 2017-10-03 MED ORDER — MENTHOL 3 MG MT LOZG: 1.0000 | LOZENGE | OROMUCOSAL | Status: DC | PRN

## 2017-10-03 MED ORDER — SIMETHICONE 80 MG PO CHEW: 80.0000 mg | CHEWABLE_TABLET | ORAL | Status: DC | PRN

## 2017-10-03 MED ORDER — ONDANSETRON HCL 4 MG/2ML IJ SOLN: 4.0000 mg | Freq: Three times a day (TID) | INTRAMUSCULAR | Status: DC | PRN

## 2017-10-03 NOTE — Anesthesia Postprocedure Evaluation (Signed)
Anesthesia Post Note  Patient: Julia Kline  Procedure(s) Performed: CESAREAN SECTION (N/A )     Patient location during evaluation: PACU Anesthesia Type: Spinal Level of consciousness: awake and alert Pain management: pain level controlled Vital Signs Assessment: post-procedure vital signs reviewed and stable Respiratory status: spontaneous breathing and respiratory function stable Cardiovascular status: blood pressure returned to baseline and stable Postop Assessment: spinal receding Anesthetic complications: no    Last Vitals:  Vitals:   10/03/17 0015 10/03/17 0032  BP: (!) 126/101 126/80  Pulse: (!) 109 97  Resp: 15 16  Temp: 36.8 C   SpO2: 99% 100%    Last Pain:  Vitals:   10/03/17 0032  TempSrc:   PainSc: 0-No pain   Pain Goal: Patients Stated Pain Goal: 10 (10/01/17 0715)               Kennieth RadFitzgerald, Quin Mcpherson E

## 2017-10-03 NOTE — Op Note (Signed)
Please see the brief operative note for surgical details 

## 2017-10-03 NOTE — Anesthesia Postprocedure Evaluation (Signed)
Anesthesia Post Note  Patient: Julia Kline  Procedure(s) Performed: CESAREAN SECTION (N/A )     Patient location during evaluation: Mother Baby Anesthesia Type: Spinal Level of consciousness: awake, awake and alert, oriented and patient cooperative Pain management: pain level controlled Vital Signs Assessment: post-procedure vital signs reviewed and stable Respiratory status: spontaneous breathing, nonlabored ventilation and respiratory function stable Cardiovascular status: stable Postop Assessment: no headache, no backache, patient able to bend at knees and no apparent nausea or vomiting Anesthetic complications: no Comments: Pharmacy told about patient's allergic reaction to either Fentanyl or Ancef during the case. Pharmacy to follow up.    Last Vitals:  Vitals:   10/03/17 0457 10/03/17 0510  BP: 118/73   Pulse: (!) 106   Resp: 18   Temp: 36.8 C 36.7 C  SpO2: 95% 95%    Last Pain:  Vitals:   10/03/17 0510  TempSrc: Oral  PainSc:    Pain Goal: Patients Stated Pain Goal: 10 (10/01/17 0715)               Damira Kem L

## 2017-10-03 NOTE — Transfer of Care (Signed)
Immediate Anesthesia Transfer of Care Note  Patient: Julia Kline Register  Procedure(s) Performed: CESAREAN SECTION (N/A )  Patient Location: PACU  Anesthesia Type:Spinal  Level of Consciousness: awake, alert  and oriented  Airway & Oxygen Therapy: Patient Spontanous Breathing  Post-op Assessment: Report given to RN and Post -op Vital signs reviewed and stable  Post vital signs: Reviewed and stable HR 100, RR 18, SaO2 99%, BP 124/92  Last Vitals:  Vitals:   10/02/17 2101 10/02/17 2118  BP: 130/89   Pulse: 95   Resp: 18   Temp:  36.7 C    Last Pain:  Vitals:   10/02/17 2118  TempSrc: Oral  PainSc:       Patients Stated Pain Goal: 10 (10/01/17 0715)  Complications: No apparent anesthesia complications

## 2017-10-03 NOTE — Addendum Note (Signed)
Addendum  created 10/03/17 0834 by Yolonda Kidaarver, Breane Grunwald L, CRNA   Sign clinical note

## 2017-10-03 NOTE — Op Note (Signed)
10/02/2017 - 10/03/2017  12:07 AM  PATIENT:  Julia Kline  32 y.o. female  PRE-OPERATIVE DIAGNOSIS:  cephalopelvic disproportion, lysis of  uterine adhesions  POST-OPERATIVE DIAGNOSIS:  cephalopelvic disproportion, lysis of  uterine adhesions, uterine adhesions to right lower quadrant of abdominal wall  PROCEDURE:  Procedure(s): CESAREAN SECTION (N/A) repeat low transverse, with lysis of uterine adhesions  SURGEON:  Surgeon(s) and Role:    Tilda Burrow, MD - Primary  PHYSICIAN ASSISTANT:   ASSISTANTS: Drucie Opitz PA-S   ANESTHESIA:   local and spinal  EBL:  1101 mL   BLOOD ADMINISTERED:none  DRAINS: Urinary Catheter (Foley)   LOCAL MEDICATIONS USED:  MARCAINE   20 cc an anterior abdominal wall fascia  SPECIMEN:  Source of Specimen:  Placenta to labor and delivery  DISPOSITION OF SPECIMEN:  PATHOLOGY  COUNTS:  YES  TOURNIQUET:  * No tourniquets in log *  DICTATION: .Dragon Dictation  PLAN OF CARE: Transferred to  postpartum care  PATIENT DISPOSITION:  PACU - hemodynamically stable.   Delay start of Pharmacological VTE agent (>24hrs) due to surgical blood loss or risk of bleeding: not applicable Details of procedure: Patient indications labor at -2--3 station with apparent inability to get the vertex and of the pelvis. Findings asynclitic presentation with vertex in the left lower quadrant, occiput posterior, with uterus malrotated due to fundal adhesions to the right lower quadrant likely dating back from her previous section Details of procedure patient was taken the operating room prepped and draped for lower abdominal surgery.  Traxi was used to tape the abdomen spinal after spinal anesthesia introduced, Ancef administered timeout conducted and abdomen prepped and draped. And then the midline opened vertically.  There was a dense fibrous midline adhesions.  Between the rectus muscles.  This was opened and Teacher, early years/pre positioned.  Thin filmy adhesions  in the lower uterine segment area where.  The uterus was malrotated vertex was sitting way over to the left.  The transverse uterine incision was made in the old C-section scar line lifted cephalad and caudad, and the fetal vertex rotated into the incision and delivered with fundal pressure.  Cord was clamped 56 seconds.  Baby was vigorously crying and was passed to the pediatricians.  See their notes for details.  Placenta delivered in response to uterine crede massage and membrane remnants were left in the lower uterine segment but were requiring several attempts to get them all.  The uterus incision was closed first with a interrupted suture on the left side where there was some venous bleeding, then to a standard 2 layer closure running locking 0 monocryl first layer and continuous running 0 Monocryl second layer.  The uterus is distorted and palpation showed some dense adhesions from the uterine fundus to the right lower quadrant.  At first it was difficult to sort out if this represented round ligament or an anterior abdominal wall adhesion to the right quadrant anatomy was traced out and remaining thin filmy adhesions taken down and was determined that these were fundal adhesions from uterus to the right lower quadrant.  These were first the smaller one was taken down by doubly clamping with Kelly clamps transecting and ligating H and.  Second was more dense greater than 1 cm diameter and required Acacian against the abdominal wall, Kelly clamp placed below the ligation transection confirmation of hemostasis of the pedicle and on the abdominal wall, and then ligation of the uterine pedicle.  At this time the right adnexa  was visibly normal with normal round ligament normal fallopian tube and ovary.  Inspection of the left adnexa showed evidence of prior salpingectomy and oophorectomy there was a small remnant of the proximal tube approximately 2 cm in length on the left side otherwise the adnexa was  previously removed. Abdomen was again inspected bowel was at no time considered in jeopardy.  Thin filmy adhesions to the sigmoid and rectosigmoid were then taken down to restore anatomy is close to normal as possible.  The anterior peritoneum was closed using running 2-0 Vicryl.  The fascia was closed using running 0 Vicryl.  Subcutaneous tissues were irrigated, Marcaine injected into the fascia, and then and then subcutaneous tissues reapproximated with 2 layers of interrupted 2 oh plain horizontal mattress sutures and then subcuticular 4-0 Vicryl closure of skin sponge and needle counts correct with estimated blood loss 1100 cc

## 2017-10-03 NOTE — Progress Notes (Signed)
POSTPARTUM PROGRESS NOTE  Post Partum Day #1  Subjective: Julia Kline is a 32 y.o. G2P2002 s/p rLTCS at 2949w6d.  No acute events overnight.  Pt denies problems with ambulating. Catheter in place. Has not ordered food yet.  She denies nausea or vomiting.  Pain is well controlled.  She has not had flatus. She has not had bowel movement.  Lochia Minimal.   Objective: Blood pressure 118/73, pulse (!) 106, temperature 98 F (36.7 C), temperature source Oral, resp. rate 18, height 5\' 9"  (1.753 m), weight 118.4 kg (261 lb), last menstrual period 12/25/2016, SpO2 95 %, unknown if currently breastfeeding.  Physical Exam:  General: Alert, cooperative and no distress Skin: Warm, and dry Heart: Regular rate and rhythm, distal pulses intact Lungs: No respiratory distress, CTAB without wheezing or rales. Abdomen: soft, mildly tender to light palpation  Uterine Fundus: firm, appropriately tender Incision: clean/dry/intact, no soaking DVT Evaluation: No calf swelling or tenderness Extremities: trace edema  Recent Labs    10/02/17 2142 10/03/17 0144  HGB 10.0* 10.2*  HCT 30.9* 31.8*    Assessment/Plan: Julia Kline is a 32 y.o. [id number] s/p rLTCS at 6949w6d   PPD#1 - Doing well  Contraception: interval BTL Feeding: breast Circ: outpatient Dispo: Plan for discharge 10/04/17.   LOS: 2 days   Claudine Moutonyler Rawan Riendeau 10/03/2017, 7:30 AM

## 2017-10-03 NOTE — Progress Notes (Signed)
md notified regarded boil that opened under arm.  MD Requested we put a dressing and tape over it. Also, patient ate before blood sugar was checked.  Per md ok to wait til lunch to check blood sugar. Will continue to monitor.

## 2017-10-04 ENCOUNTER — Encounter: Payer: Medicaid Other | Admitting: Obstetrics & Gynecology

## 2017-10-04 DIAGNOSIS — O24419 Gestational diabetes mellitus in pregnancy, unspecified control: Secondary | ICD-10-CM | POA: Diagnosis present

## 2017-10-04 LAB — GLUCOSE, CAPILLARY: Glucose-Capillary: 88 mg/dL (ref 65–99)

## 2017-10-04 LAB — BIRTH TISSUE RECOVERY COLLECTION (PLACENTA DONATION)

## 2017-10-04 MED ORDER — LACTATED RINGERS IV SOLN: INTRAVENOUS | Status: DC

## 2017-10-04 MED ORDER — OXYTOCIN BOLUS FROM INFUSION: 500.0000 mL | Freq: Once | INTRAVENOUS | Status: DC

## 2017-10-04 MED ORDER — LIDOCAINE HCL (PF) 1 % IJ SOLN: 30.0000 mL | INTRAMUSCULAR | Status: DC | PRN

## 2017-10-04 MED ORDER — FENTANYL CITRATE (PF) 100 MCG/2ML IJ SOLN: 100.0000 ug | INTRAMUSCULAR | Status: DC | PRN

## 2017-10-04 MED ORDER — SOD CITRATE-CITRIC ACID 500-334 MG/5ML PO SOLN: 30.0000 mL | ORAL | Status: DC | PRN

## 2017-10-04 MED ORDER — LACTATED RINGERS IV SOLN: 500.0000 mL | INTRAVENOUS | Status: DC | PRN

## 2017-10-04 MED ORDER — PENICILLIN G POT IN DEXTROSE 60000 UNIT/ML IV SOLN: 3.0000 10*6.[IU] | INTRAVENOUS | Status: DC

## 2017-10-04 MED ORDER — OXYTOCIN 40 UNITS IN LACTATED RINGERS INFUSION - SIMPLE MED: 2.5000 [IU]/h | INTRAVENOUS | Status: DC

## 2017-10-04 MED ORDER — ACETAMINOPHEN 325 MG PO TABS: 650.0000 mg | ORAL_TABLET | ORAL | Status: DC | PRN

## 2017-10-04 MED ORDER — SODIUM CHLORIDE 0.9 % IV SOLN: 5.0000 10*6.[IU] | Freq: Once | INTRAVENOUS | Status: DC

## 2017-10-04 MED ORDER — ONDANSETRON HCL 4 MG/2ML IJ SOLN: 4.0000 mg | Freq: Four times a day (QID) | INTRAMUSCULAR | Status: DC | PRN

## 2017-10-04 NOTE — Progress Notes (Signed)
POSTPARTUM PROGRESS NOTE  Post Partum Day 2  Subjective:  Julia Kline is a 32 y.o. [id number] s/p rLTCS at 5620w6d after IOL for gestation HTN resulted in failure of descent due to cephalopelvic disproportion .  No acute events overnight.  Pt report some pain with ambulating, but no issues voiding or with po intake.  She denies nausea or vomiting.  Pain is moderately controlled.  She has had flatus. She has not had bowel movement.  Lochia Minimal.   Objective: Blood pressure 122/76, pulse 81, temperature 98 F (36.7 C), resp. rate 18, height 5\' 9"  (1.753 m), weight 118.4 kg (261 lb), last menstrual period 12/25/2016, SpO2 95 %, unknown if currently breastfeeding.  Physical Exam:  General: alert, cooperative and no distress Chest: no respiratory distress Heart:regular rate, distal pulses intact Abdomen: soft, nontender Uterine Fundus: firm, appropriately tender DVT Evaluation: No calf swelling or tenderness Extremities: no pitting edema Skin: warm, dry; incision clean/dry/intact  Recent Labs    10/03/17 0144 10/03/17 0725  HGB 10.2* 9.4*  HCT 31.8* 29.4*    Assessment/Plan: Julia Kline is a 32 y.o. [id number] s/p rLTCS after attempted vaginal delivery at 1120w6d   PPD#2 - Doing well although continues to report pain with ambulation.  Still has not stooled but is on stool softeners. Passing gas.   Contraception: Considering IUD now but unsure Feeding: breast Dispo: Plan for discharge possibly tomorrow.   LOS: 3 days   Ames CoupeCharles A Hershy Flenner MS3 10/04/2017, 9:43 AM

## 2017-10-04 NOTE — Progress Notes (Signed)
Per md ok to stop CBG checks

## 2017-10-05 ENCOUNTER — Ambulatory Visit: Payer: Self-pay

## 2017-10-05 MED ORDER — IBUPROFEN 600 MG PO TABS: 600.0000 mg | ORAL_TABLET | Freq: Four times a day (QID) | ORAL | 0 refills | Status: DC

## 2017-10-05 MED ORDER — OXYCODONE-ACETAMINOPHEN 5-325 MG PO TABS: 1.0000 | ORAL_TABLET | Freq: Four times a day (QID) | ORAL | 0 refills | Status: DC | PRN

## 2017-10-05 NOTE — Progress Notes (Signed)
Subjective: Postpartum Day #3: Cesarean Delivery Patient reports incisional pain, tolerating PO, + flatus and no problems voiding.    Objective: Vital signs in last 24 hours: Temp:  [98.2 F (36.8 C)] 98.2 F (36.8 C) (02/16 0516) Pulse Rate:  [91] 91 (02/16 0516) Resp:  [18] 18 (02/16 0516) BP: (120)/(86) 120/86 (02/16 0516) Weight:  [262 lb (118.8 kg)] 262 lb (118.8 kg) (02/16 0300)  Physical Exam:  General: alert, cooperative and no distress Lochia: appropriate Uterine Fundus: firm Incision: honey comb dressing intact DVT Evaluation: No evidence of DVT seen on physical exam. No cords or calf tenderness. No significant calf/ankle edema.  Recent Labs    10/03/17 0144 10/03/17 0725  HGB 10.2* 9.4*  HCT 31.8* 29.4*    Assessment/Plan: Status post Cesarean section. Doing well postoperatively.  Discharge home with standard precautions and return to clinic in 1-2 weeks for incision check.  Roe Coombsachelle A Denney, CNM 10/05/2017, 6:34 AM

## 2017-10-05 NOTE — Lactation Note (Signed)
This note was copied from a baby's chart. Lactation Consultation Note  Patient Name: Boy Lynden OxfordChantel Barbee OZHYQ'MToday's Date: 10/04/2017  P2 mom with 14 months bf experience with her now 32 year old. Mom states she is able to latch infant well and that he has been feeding.  Mom states she does not have concerns at this time.  LC reviews hand expression with mom.  Drops of colostrum seen upon expression.  LC provided bullet to collect drops into.  LC attempts to wake infant by removing hat and using gloved figure to rub colostrum on lips.  LC encouraged mom to hand express before and after feedings.  Infant does not wake or cue.  LC reviews bf basics and tells mom to feed infant at least 8-12 times in a 24 hour period.  [facility] given and support group information shared.   LC encouraged mom to call out for questions and or assistance with feeds.     Maternal Data    Feeding Feeding Type: Breast Milk  LATCH Score                   Interventions    Lactation Tools Discussed/Used Tools: Pump Pump Review: Setup, frequency, and cleaning Initiated by:: Milagros Reaponna Esker, RN Date initiated:: 10/05/17   Consult Status      Maryruth HancockKelly Suzanne [facility]Black 10/05/2017, 1:33 PM

## 2017-10-05 NOTE — Lactation Note (Signed)
This note was copied from a baby's chart. Lactation Consultation Note  Patient Name: Julia Kline ZOXWR'UToday's Date: 10/05/2017 Reason for consult: Follow-up assessment;Infant weight loss   Baby 58 hours old.  9.9% weight loss (2.2% last night).  Stools transitioned to green.  Waiting on an additional void. Observed nutritive feeding for more than 10 min.  Reminded mother to compress breast during feeding to keep baby active. Mother easily expressed transitional breastmlk into spoon to demonstrate how to spoon feed. [name] E. Will set up DEBP.  Recommend mother post pump 3-4 times per day and give baby back volume pumped. Additionally recommend spoon feeding baby at each feeding with hand expressed milk. Mother will use manual pump at home. Mom encouraged to feed baby 8-12 times/24 hours and with feeding cues.  Reviewed engorgement care and monitoring voids/stools.    Maternal Data Has patient been taught Hand Expression?: Yes  Feeding Feeding Type: Breast Fed  LATCH Score Latch: Grasps breast easily, tongue down, lips flanged, rhythmical sucking.  Audible Swallowing: A few with stimulation  Type of Nipple: Everted at rest and after stimulation  Comfort (Breast/Nipple): Soft / non-tender  Hold (Positioning): No assistance needed to correctly position infant at breast.  LATCH Score: 9  Interventions Interventions: Hand express;Hand pump;DEBP  Lactation Tools Discussed/Used Pump Review: Setup, frequency, and cleaning;Milk Storage Initiated by:: Milagros Reaponna Esker RN Date initiated:: 10/05/17   Consult Status Consult Status: Complete    Hardie PulleyBerkelhammer, Odetta Forness Boschen 10/05/2017, 9:25 AM

## 2017-10-05 NOTE — Discharge Summary (Addendum)
OB Discharge Summary     Patient Name: Julia Kline DOB: [birth year] MRN: [id number]  Date of admission: 10/01/2017 Delivering MD: Jonnie Kind   Date of discharge: 10/08/2017  Admitting diagnosis: 36 WEEKS BACK MIDDLE ABD PAIN Intrauterine pregnancy: [redacted]w[redacted]d    Secondary diagnosis:  Active Problems:   Gestational hypertension   Gestational diabetes  Additional problems: LGA, GBS+     Discharge diagnosis: Term Pregnancy Delivered, RLTCS                                                                                                Post partum procedures:none  Augmentation: AROM, Pitocin and Foley Balloon  Complications: None  Hospital course:  Induction of Labor With Cesarean Section  32y.o. yo G2P2002 at [id number] admitted to the hospital 10/01/2017 for induction of labor due to gestational HTN as she desired a TOLAC. Patient had a labor course significant for failure to progress and was taken for repeat cesarean section. She delivered a Viable infant,10/02/2017  Membrane Rupture Time/Date: 4:23 PM ,10/02/2017   Details of operation can be found in separate operative Note.  Patient had an uncomplicated postpartum course. She is ambulating, tolerating a regular diet, passing flatus, and urinating well.  Patient is discharged home in stable condition on 10/08/17.                                    Physical exam  Vitals:   10/03/17 1646 10/04/17 0610 10/05/17 0300 10/05/17 0516  BP: 118/68 122/76  120/86  Pulse: 96 81  91  Resp: '18 18  18  ' Temp: 98.1 F (36.7 C) 98 F (36.7 C)  98.2 F (36.8 C)  TempSrc: Oral   Oral  SpO2:      Weight:   262 lb (118.8 kg)   Height:       General: alert, cooperative and no distress Lochia: appropriate Uterine Fundus: firm Incision: Healing well with no significant drainage DVT Evaluation: No evidence of DVT seen on physical exam. No cords or calf tenderness. No significant calf/ankle edema. Labs: Lab Results  Component Value  Date   WBC 13.7 (H) 10/03/2017   HGB 9.4 (L) 10/03/2017   HCT 29.4 (L) 10/03/2017   MCV 70.3 (L) 10/03/2017   PLT 217 10/03/2017   CMP Latest Ref Rng & Units 10/01/2017  Glucose 65 - 99 mg/dL 103(H)  BUN 6 - 20 mg/dL 10  Creatinine 0.44 - 1.00 mg/dL 0.52  Sodium 135 - 145 mmol/L 134(L)  Potassium 3.5 - 5.1 mmol/L 4.0  Chloride 101 - 111 mmol/L 107  CO2 22 - 32 mmol/L 18(L)  Calcium 8.9 - 10.3 mg/dL 8.8(L)  Total Protein 6.5 - 8.1 g/dL 6.2(L)  Total Bilirubin 0.3 - 1.2 mg/dL 0.4  Alkaline Phos 38 - 126 U/L 101  AST 15 - 41 U/L 15  ALT 14 - 54 U/L 15    Discharge instruction: per After Visit Summary and "Baby and Me Booklet".  After visit meds:  Allergies  as of 10/05/2017      Reactions   Ancef [cefazolin] Hives, Swelling   Facial and neck swelling; Had reaction after receiving fentanyl and ancef in the OR   Fentanyl Hives, Swelling   Facial and neck swelling; Had reaction after receiving fentanyl and ancef in the OR   Pseudoephedrine Hcl Other (See Comments)   Morphine And Related Rash      Medication List    STOP taking these medications   ACCU-CHEK FASTCLIX LANCETS Misc   ACCU-CHEK NANO SMARTVIEW w/Device Kit   fluconazole 150 MG tablet Commonly known as:  DIFLUCAN   glucose blood test strip Commonly known as:  ACCU-CHEK SMARTVIEW   glyBURIDE 2.5 MG tablet Commonly known as:  DIABETA     TAKE these medications   hydrocortisone 25 MG suppository Commonly known as:  ANUSOL-HC Place 1 suppository (25 mg total) rectally 2 (two) times daily.   hydrocortisone-pramoxine rectal foam Commonly known as:  PROCTOFOAM HC Place 1 applicator rectally 2 (two) times daily.   ibuprofen 600 MG tablet Commonly known as:  ADVIL,MOTRIN Take 1 tablet (600 mg total) by mouth every 6 (six) hours.   oxyCODONE-acetaminophen 5-325 MG tablet Commonly known as:  PERCOCET/ROXICET Take 1-2 tablets by mouth every 6 (six) hours as needed for moderate pain.   polyethylene glycol  powder powder Commonly known as:  GLYCOLAX/MIRALAX Take 17 g by mouth daily as needed for moderate constipation.   prenatal vitamin w/FE, FA 27-1 MG Tabs tablet Take 1 tablet by mouth daily at 12 noon.       Diet: routine diet  Activity: Advance as tolerated. Pelvic rest for 6 weeks.   Outpatient follow up:2 weeks Follow up Appt: Future Appointments  Date Time Provider [place]  10/09/2017  3:00 PM [facility] NURSE [facility]   Follow up Visit:No Follow-up on file.  Postpartum contraception: IUD vs Interval BTL  Newborn Data: Live born female  Birth Weight: 9 lb 8.9 oz (4335 g) APGAR: 8, 9  Newborn Delivery   Birth date/time:  10/02/2017 22:50:00 Delivery type:  C-Section, Low Transverse C-section categorization:  Repeat     Baby Feeding: Breast Disposition:home with mother   10/08/2017 Sloan Leiter, MD    Attestation of Attending Supervision of Advanced Practitioner (PA/CNM/NP): Evaluation and management procedures were performed by the Advanced Practitioner under my supervision and collaboration.  I have reviewed the Advanced Practitioner's note and chart, and I agree with the management and plan.  Feliz Beam, M.D. Attending [place], [facility] for [employer], [place] [facility]  10/08/2017 9:51 AM

## 2017-10-06 ENCOUNTER — Telehealth (HOSPITAL_COMMUNITY): Payer: Self-pay | Admitting: Lactation Services

## 2017-10-06 NOTE — Telephone Encounter (Signed)
[facility]'s [facility].  Due back 3/1.

## 2017-10-09 ENCOUNTER — Other Ambulatory Visit: Payer: Medicaid Other | Admitting: *Deleted

## 2017-10-09 VITALS — BP 141/94 | HR 98

## 2017-10-09 DIAGNOSIS — Z5189 Encounter for other specified aftercare: Secondary | ICD-10-CM

## 2017-10-09 DIAGNOSIS — O139 Gestational [pregnancy-induced] hypertension without significant proteinuria, unspecified trimester: Secondary | ICD-10-CM

## 2017-10-09 MED ORDER — HYDROCHLOROTHIAZIDE 25 MG PO TABS: 25.0000 mg | ORAL_TABLET | Freq: Every day | ORAL | 3 refills | Status: DC

## 2017-10-09 NOTE — Progress Notes (Signed)
Subjective:     Julia Kline is a 32 y.o. female who presents to the clinic 1 weeks status post LTCS . Eating a regular diet with difficulty. Bowel movements are normal. Pain is controlled with current analgesics. Medications being used: prescription NSAID's including ibuprofen (Motrin) and narcotic analgesics including oxycodone/acetaminophen (Percocet, Tylox). She takes 1 of each twice daily. She states she has had a headache since delivery.    Objective:    LMP 12/25/2016 (Exact Date)  General:  alert, cooperative and fatigued  Abdomen: soft, bowel sounds active, non-tender  Incision:   healing well, no drainage, no erythema, no hernia, no seroma, no swelling, no dehiscence, incision well approximated    3+ LE edema Assessment:    Postoperative course complicated by elevated BP and headache   Plan:    1. Continue any current medications. Added HCTZ 25mg  per Dr.Anyanqwu. CBC,CMP ordered.  2. Wound care discussed. 3. Activity restrictions: no bending, stooping, or squatting 4. Anticipated return to work: approx 6 weeks. 5. Follow up: 4 weeks for PP.   Janit BernMandy Liandro Thelin, CMA

## 2017-10-10 LAB — COMPREHENSIVE METABOLIC PANEL
ALT: 16 IU/L (ref 0–32)
AST: 14 IU/L (ref 0–40)
Albumin/Globulin Ratio: 0.9 — ABNORMAL LOW (ref 1.2–2.2)
Albumin: 2.7 g/dL — ABNORMAL LOW (ref 3.5–5.5)
Alkaline Phosphatase: 81 IU/L (ref 39–117)
BUN/Creatinine Ratio: 12 (ref 9–23)
BUN: 8 mg/dL (ref 6–20)
Bilirubin Total: <0.2 mg/dL (ref 0.0–1.2)
CO2: 21 mmol/L (ref 20–29)
Calcium: 8.4 mg/dL — ABNORMAL LOW (ref 8.7–10.2)
Chloride: 109 mmol/L — ABNORMAL HIGH (ref 96–106)
Creatinine, Ser: 0.68 mg/dL (ref 0.57–1.00)
GFR calc Af Amer: 135 mL/min/{1.73_m2} (ref >59)
GFR calc non Af Amer: 117 mL/min/{1.73_m2} (ref >59)
Globulin, Total: 3 g/dL (ref 1.5–4.5)
Glucose: 106 mg/dL — ABNORMAL HIGH (ref 65–99)
Potassium: 4.3 mmol/L (ref 3.5–5.2)
Sodium: 145 mmol/L — ABNORMAL HIGH (ref 134–144)
Total Protein: 5.7 g/dL — ABNORMAL LOW (ref 6.0–8.5)

## 2017-10-10 LAB — CBC
Hematocrit: 26.2 % — ABNORMAL LOW (ref 34.0–46.6)
Hemoglobin: 8.5 g/dL — ABNORMAL LOW (ref 11.1–15.9)
MCH: 22.6 pg — ABNORMAL LOW (ref 26.6–33.0)
MCHC: 32.4 g/dL (ref 31.5–35.7)
MCV: 70 fL — ABNORMAL LOW (ref 79–97)
Platelets: 305 10*3/uL (ref 150–379)
RBC: 3.76 x10E6/uL — ABNORMAL LOW (ref 3.77–5.28)
RDW: 15.6 % — ABNORMAL HIGH (ref 12.3–15.4)
WBC: 7.8 10*3/uL (ref 3.4–10.8)

## 2017-10-11 ENCOUNTER — Inpatient Hospital Stay (HOSPITAL_COMMUNITY)
Admission: AD | Admit: 2017-10-11 | Discharge: 2017-10-11 | Disposition: A | Discharge status: Home / Self Care | Payer: Medicaid Other | Source: Ambulatory Visit | Attending: Obstetrics & Gynecology | Admitting: Obstetrics & Gynecology

## 2017-10-11 ENCOUNTER — Inpatient Hospital Stay (HOSPITAL_BASED_OUTPATIENT_CLINIC_OR_DEPARTMENT_OTHER): Payer: Medicaid Other

## 2017-10-11 ENCOUNTER — Encounter (HOSPITAL_COMMUNITY): Payer: Self-pay | Admitting: *Deleted

## 2017-10-11 ENCOUNTER — Ambulatory Visit: Payer: Medicaid Other

## 2017-10-11 VITALS — BP 145/91 | HR 96 | Wt 252.0 lb

## 2017-10-11 DIAGNOSIS — O1205 Gestational edema, complicating the puerperium: Secondary | ICD-10-CM | POA: Diagnosis not present

## 2017-10-11 DIAGNOSIS — M7989 Other specified soft tissue disorders: Secondary | ICD-10-CM | POA: Diagnosis not present

## 2017-10-11 DIAGNOSIS — Z885 Allergy status to narcotic agent status: Secondary | ICD-10-CM | POA: Diagnosis not present

## 2017-10-11 DIAGNOSIS — Z881 Allergy status to other antibiotic agents status: Secondary | ICD-10-CM | POA: Insufficient documentation

## 2017-10-11 DIAGNOSIS — Z0130 Encounter for examination of blood pressure without abnormal findings: Secondary | ICD-10-CM

## 2017-10-11 DIAGNOSIS — M79609 Pain in unspecified limb: Secondary | ICD-10-CM | POA: Diagnosis not present

## 2017-10-11 DIAGNOSIS — R030 Elevated blood-pressure reading, without diagnosis of hypertension: Secondary | ICD-10-CM | POA: Diagnosis present

## 2017-10-11 LAB — COMPREHENSIVE METABOLIC PANEL
ALT: 15 U/L (ref 14–54)
AST: 14 U/L — ABNORMAL LOW (ref 15–41)
Albumin: 2.8 g/dL — ABNORMAL LOW (ref 3.5–5.0)
Alkaline Phosphatase: 70 U/L (ref 38–126)
Anion gap: 7 (ref 5–15)
BUN: 8 mg/dL (ref 6–20)
CO2: 26 mmol/L (ref 22–32)
Calcium: 8 mg/dL — ABNORMAL LOW (ref 8.9–10.3)
Chloride: 106 mmol/L (ref 101–111)
Creatinine, Ser: 0.62 mg/dL (ref 0.44–1.00)
GFR calc Af Amer: >60 mL/min (ref >60)
GFR calc non Af Amer: >60 mL/min (ref >60)
Glucose, Bld: 83 mg/dL (ref 65–99)
Potassium: 3.7 mmol/L (ref 3.5–5.1)
Sodium: 139 mmol/L (ref 135–145)
Total Bilirubin: 0.5 mg/dL (ref 0.3–1.2)
Total Protein: 6.4 g/dL — ABNORMAL LOW (ref 6.5–8.1)

## 2017-10-11 LAB — PROTEIN / CREATININE RATIO, URINE
Creatinine, Urine: 65 mg/dL
Protein Creatinine Ratio: 0.2 mg/mg{Cre} — ABNORMAL HIGH (ref 0.00–0.15)
Total Protein, Urine: 13 mg/dL

## 2017-10-11 LAB — CBC
HCT: 27.6 % — ABNORMAL LOW (ref 36.0–46.0)
Hemoglobin: 8.7 g/dL — ABNORMAL LOW (ref 12.0–15.0)
MCH: 22.5 pg — ABNORMAL LOW (ref 26.0–34.0)
MCHC: 31.5 g/dL (ref 30.0–36.0)
MCV: 71.5 fL — ABNORMAL LOW (ref 78.0–100.0)
Platelets: 274 10*3/uL (ref 150–400)
RBC: 3.86 MIL/uL — ABNORMAL LOW (ref 3.87–5.11)
RDW: 15 % (ref 11.5–15.5)
WBC: 8.8 10*3/uL (ref 4.0–10.5)

## 2017-10-11 MED ORDER — OXYCODONE-ACETAMINOPHEN 5-325 MG PO TABS
2.0000 | ORAL_TABLET | Freq: Once | ORAL | Status: AC
  Administered 2017-10-11: 2 via ORAL
  Filled 2017-10-11: qty 2

## 2017-10-11 MED ORDER — IBUPROFEN 600 MG PO TABS
600.0000 mg | ORAL_TABLET | Freq: Once | ORAL | Status: AC
  Administered 2017-10-11: 600 mg via ORAL
  Filled 2017-10-11: qty 1

## 2017-10-11 MED ORDER — FUROSEMIDE 80 MG PO TABS
80.0000 mg | ORAL_TABLET | Freq: Once | ORAL | Status: AC
  Administered 2017-10-11: 80 mg via ORAL
  Filled 2017-10-11: qty 1

## 2017-10-11 MED ORDER — HYDROCHLOROTHIAZIDE 25 MG PO TABS
25.0000 mg | ORAL_TABLET | Freq: Every day | ORAL | Status: DC
  Filled 2017-10-11 (×2): qty 1

## 2017-10-11 NOTE — MAU Note (Signed)
Doppler study in progress 

## 2017-10-11 NOTE — MAU Note (Signed)
[facility].

## 2017-10-11 NOTE — Progress Notes (Signed)
Bilateral lower extremity venous duplex has been completed. Negative for DVT. Results were given to the patient's nurse, Darel HongJudy.  10/11/17 5:04 PM Olen CordialGreg Witney Huie RVT

## 2017-10-11 NOTE — MAU Note (Addendum)
Pt sent from MD office, C/S on Feb 13, had GHTN & was induced.  BP up @ MD office today.  C/O HA since Wednesday, "takes a while for me to focus my vision."  Denies epigastric pain, swelling noted in feet.  Pt states she feels like she has knots in her calves, legs hurt.  Pt taking HCTZ daily, last dose was yesterday.

## 2017-10-11 NOTE — Progress Notes (Signed)
Patient presented to the office today for blood pressure check. Blood pressure is 145/91 she complains of blurred vision and bad headaches this am. Spoke with Dr.Pickens he has suggested she go back to MAU for further work up. Patient vocie understanding.

## 2017-10-11 NOTE — MAU Provider Note (Signed)
History     CSN: [id number]  Arrival date and time: 10/11/17 1041   First Provider Initiated Contact with Patient 10/11/17 1248      Chief Complaint  Patient presents with  . Hypertension   HPI  Ms.  Julia Kline is a 32 y.o. year old G27P2002 9 day postpartum (s/p C/S) female who was sent to MAU from [facility] for elevated BP. She reports that she was induced for GHTN, but had no problems with her BP in the immediate PP period. She reports being sent home on HCTZ 25 mg. She was told the HCTZ "would help with swelling and BP". She did not take her HCTZ today. She called [facility] to be seen today when her H/A persisted. When she got there, all she had done was a BP check. Dr. Vergie Living then recommended she come to MAU for re-evaluation. She also states that in addition to the swelling in BLE, she has "painful knot areas" with walking. Denies redness in calves.  Past Medical History:  Diagnosis Date  . Eczema   . Gestational diabetes    glyburide  . Hernia cerebri (HCC)   . Hx of preeclampsia, prior pregnancy, currently pregnant   . Hypertension   . Migraine   . PCOS (polycystic ovarian syndrome)   . Pregnancy induced hypertension   . Pregnant   . Vaginal Pap smear, abnormal     Past Surgical History:  Procedure Laterality Date  . CESAREAN SECTION  2017   Procedure: CESAREAN DELIVERY ONLY; Surgeon: Marian Sorrow, MD; Location: [facility]; Service: Obstetrics  . CESAREAN SECTION N/A 10/02/2017   Procedure: CESAREAN SECTION;  Surgeon: Tilda Burrow, MD;  Location: [facility] BIRTHING SUITES;  Service: Obstetrics;  Laterality: N/A;  . LAPAROSCOPIC OOPHERECTOMY Left   . LEEP    . OVARIAN CYST REMOVAL    . TONSILLECTOMY    . WISDOM TOOTH EXTRACTION      Family History  Problem Relation Age of Onset  . Diabetes Mother   . Diabetes Father   . Hypertension Maternal Grandmother     Social History   Tobacco Use  . Smoking status: Never Smoker  . Smokeless tobacco:  Never Used  Substance Use Topics  . Alcohol use: No  . Drug use: No    Allergies:  Allergies  Allergen Reactions  . Ancef [Cefazolin] Hives and Swelling    Facial and neck swelling; Had reaction after receiving fentanyl and ancef in the OR  . Fentanyl Hives and Swelling    Facial and neck swelling; Had reaction after receiving fentanyl and ancef in the OR  . Pseudoephedrine Hcl Other (See Comments)  . Morphine And Related Rash    Medications Prior to Admission  Medication Sig Dispense Refill Last Dose  . hydrochlorothiazide (HYDRODIURIL) 25 MG tablet Take 1 tablet (25 mg total) by mouth daily. 30 tablet 3   . hydrocortisone (ANUSOL-HC) 25 MG suppository Place 1 suppository (25 mg total) rectally 2 (two) times daily. 12 suppository 1 Taking  . hydrocortisone-pramoxine (PROCTOFOAM HC) rectal foam Place 1 applicator rectally 2 (two) times daily. 10 g 0 Taking  . ibuprofen (ADVIL,MOTRIN) 600 MG tablet Take 1 tablet (600 mg total) by mouth every 6 (six) hours. 30 tablet 0 Taking  . oxyCODONE-acetaminophen (PERCOCET/ROXICET) 5-325 MG tablet Take 1-2 tablets by mouth every 6 (six) hours as needed for moderate pain. 30 tablet 0 Taking  . polyethylene glycol powder (GLYCOLAX/MIRALAX) powder Take 17 g by mouth daily as needed  for moderate constipation. 500 g 2 Taking  . prenatal vitamin w/FE, FA (PRENATAL 1 + 1) 27-1 MG TABS tablet Take 1 tablet by mouth daily at 12 noon.   Taking    Review of Systems  Constitutional: Negative.   HENT: Negative.   Eyes: Negative.   Respiratory: Negative.   Cardiovascular: Positive for leg swelling.  Endocrine: Negative.   Musculoskeletal: Negative.   Skin: Negative.   Allergic/Immunologic: Negative.   Neurological: Positive for headaches.  Hematological: Negative.   Psychiatric/Behavioral: Negative.    Physical Exam   Patient Vitals for the past 24 hrs:  BP Temp Temp src Pulse Resp Weight  10/11/17 1838 (!) 141/95 - - 94 20 -  10/11/17 1749 - - -  - - 249 lb (112.9 kg)  10/11/17 1556 139/90 - - 98 - -  10/11/17 1555 - 98.3 F (36.8 C) Oral - 16 -  10/11/17 1500 (!) 151/100 - - 98 - -  10/11/17 1445 (!) 138/97 - - 85 - -  10/11/17 1430 (!) 137/97 - - 87 - -  10/11/17 1415 (!) 163/100 - - 90 - -  10/11/17 1400 135/90 - - 86 - -  10/11/17 1345 (!) 142/96 - - 93 - -  10/11/17 1331 (!) 146/96 - - (!) 101 - -  10/11/17 1315 (!) 146/95 - - 86 - -  10/11/17 1300 (!) 158/97 - - 89 - -  10/11/17 1245 (!) 138/91 - - 83 - -  10/11/17 1230 (!) 137/98 - - 87 - -  10/11/17 1215 (!) 148/95 - - 86 - -  10/11/17 1200 (!) 135/91 - - 83 - -  10/11/17 1145 (!) 138/98 - - 85 - -  10/11/17 1130 (!) 136/91 - - 94 - -  10/11/17 1118 (!) 136/97 - - 96 - -  10/11/17 1100 (!) 134/99 - - 92 - -  10/11/17 1057 (!) 139/93 - - 98 - -  10/11/17 1048 (!) 153/98 98.5 F (36.9 C) Oral 99 18 252 lb (114.3 kg)    Blood pressure (!) 148/95, pulse 86, temperature 98.5 F (36.9 C), temperature source Oral, resp. rate 18, currently breastfeeding.  Physical Exam  Nursing note and vitals reviewed. Constitutional: She is oriented to person, place, and time. She appears well-developed and well-nourished.  HENT:  Head: Normocephalic and atraumatic.  Eyes: Pupils are equal, round, and reactive to light.  Neck: Normal range of motion.  Cardiovascular: Normal rate, regular rhythm, normal heart sounds and intact distal pulses.  Respiratory: Effort normal and breath sounds normal.  GI: Soft. Bowel sounds are normal.  Genitourinary:  Genitourinary Comments: Pelvic not indicated  Musculoskeletal: Normal range of motion. She exhibits edema (2+ in BLE up to the knee level ).  Neurological: She is alert and oriented to person, place, and time. She has normal reflexes.  Skin: Skin is warm and dry.  Psychiatric: She has a normal mood and affect. Her behavior is normal. Judgment and thought content normal.    MAU Course  Procedures  MDM CBCB CMP P/C Ratio Lasix 80  mg po -- good diuresising and weight loss of 3 lbs Percocet 3/325 x 2 tablets -- improved H/A, but now wearing off; requests something else Ibuprofen 600 mg -- improved H/A   Intake/Output Summary (Last 24 hours) at 10/11/2017 1940 Last data filed at 10/11/2017 1732 Gross per 24 hour  Intake -  Output 2400 ml  Net -2400 ml    Results for orders placed  or performed during the hospital encounter of 10/11/17 (from the past 24 hour(s))  CBC     Status: Abnormal   Collection Time: 10/11/17 11:01 AM  Result Value Ref Range   WBC 8.8 4.0 - 10.5 K/uL   RBC 3.86 (L) 3.87 - 5.11 MIL/uL   Hemoglobin 8.7 (L) 12.0 - 15.0 g/dL   HCT 78.427.6 (L) 69.636.0 - 29.546.0 %   MCV 71.5 (L) 78.0 - 100.0 fL   MCH 22.5 (L) 26.0 - 34.0 pg   MCHC 31.5 30.0 - 36.0 g/dL   RDW 28.415.0 13.211.5 - 44.015.5 %   Platelets 274 150 - 400 K/uL  Comprehensive metabolic panel     Status: Abnormal   Collection Time: 10/11/17 11:01 AM  Result Value Ref Range   Sodium 139 135 - 145 mmol/L   Potassium 3.7 3.5 - 5.1 mmol/L   Chloride 106 101 - 111 mmol/L   CO2 26 22 - 32 mmol/L   Glucose, Bld 83 65 - 99 mg/dL   BUN 8 6 - 20 mg/dL   Creatinine, Ser 1.020.62 0.44 - 1.00 mg/dL   Calcium 8.0 (L) 8.9 - 10.3 mg/dL   Total Protein 6.4 (L) 6.5 - 8.1 g/dL   Albumin 2.8 (L) 3.5 - 5.0 g/dL   AST 14 (L) 15 - 41 U/L   ALT 15 14 - 54 U/L   Alkaline Phosphatase 70 38 - 126 U/L   Total Bilirubin 0.5 0.3 - 1.2 mg/dL   GFR calc non Af Amer >60 >60 mL/min   GFR calc Af Amer >60 >60 mL/min   Anion gap 7 5 - 15  Protein / creatinine ratio, urine     Status: Abnormal   Collection Time: 10/11/17 11:10 AM  Result Value Ref Range   Creatinine, Urine 65.00 mg/dL   Total Protein, Urine 13 mg/dL   Protein Creatinine Ratio 0.20 (H) 0.00 - 0.15 mg/mg[Cre]    Assessment and Plan  Gestational edema affecting puerperium - BP & weight check in 1 wk at [facility] - Advised to take HCTZ tomorrow - Information provided on peripheral edema - Discharge home - Patient  verbalized an understanding of the plan of care and agrees.    Raelyn Moraolitta July Linam, MSN, CNM 10/11/2017, 12:48 PM

## 2017-10-17 ENCOUNTER — Telehealth: Payer: Self-pay

## 2017-10-17 NOTE — Telephone Encounter (Signed)
[facility] to [facility], BP 138/84 weight 226.6, no headache or blurred vision. Patient does have edema in right ankle but no pain.

## 2017-10-17 NOTE — Telephone Encounter (Signed)
Call patient to make sure she is still taking her medication as provider instructed. No answer or voice mail to leave a message.

## 2017-10-29 NOTE — Telephone Encounter (Signed)
Incoming Telephone call:  Per mom baby is 563 weeks old , and woke up today diarrhea x 3.  Mom denies other S/S's this am. 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  LC recommended to continue breast feeding.  Drink plenty of fluids. If the diarrhea continues to call her MD and  Inform them she is breast feeding and med has to be compatible.  [facility].

## 2017-11-12 NOTE — Progress Notes (Signed)
Post Partum Exam  Julia Kline is a 32 y.o. 612P2002 female who presents for a postpartum visit. She is 5 weeks postpartum following a low cervical transverse Cesarean section. I have fully reviewed the prenatal and intrapartum course. The delivery was at 38.5 gestational weeks.  Anesthesia: epidural. Postpartum course has been complicated by hypertension. Baby's course has been unremarkable. Baby is feeding by breast. Bleeding red, clots and changing a maxi pad 3 times a day. Bowel function is normal. Bladder function is normal. Patient is sexually active. Contraception method is IUD. Postpartum depression screening:Negative I have independently reviewed this information. The following portions of the patient's history were reviewed and updated as appropriate: allergies, current medications, past family history, past medical history, past social history, past surgical history and problem list. Last pap smear done 03/01/2017 and was Normal  Review of Systems Pertinent items noted in HPI and remainder of comprehensive ROS otherwise negative.    Objective:  currently breastfeeding.  General:  alert, cooperative and appears stated age  Lungs: normal effort  Heart:  regular rate and rhythm  Abdomen: soft, non-tender; bowel sounds normal; no masses,  no organomegaly   Vulva:  normal  Vagina: normal vagina and moderate amount of bleeding  Cervix:  no lesions  Procedure: Patient identified, informed consent performed, signed copy in chart, time out was performed.  Urine pregnancy test negative.  Speculum placed in the vagina.  Cervix visualized.  Cleaned with Betadine x 2.  Grasped anteriourly with a single tooth tenaculum.  Uterus sounded to 8 cm.  Liletta IUD placed per manufacturer's recommendations.  Strings trimmed to 3 cm.   Patient given post procedure instructions and Liletta care card with expiration date.  Patient is asked to check IUD strings periodically and follow up in 4-6 weeks  for IUD check.       Assessment:    Postpartum exam. Continued vaginal bleeding. HTN, H/o GDM Pap smear not done at today's visit.   Plan:   1. Contraception: IUD 2. Continue breastfeeding 3. If bleeding persists, return 4. Resume BP meds--missed dose today 5. 2 hour OGT today 6. Follow up in: 4 weeks for IUD check or as needed.

## 2017-11-13 ENCOUNTER — Encounter: Payer: Self-pay | Admitting: Family Medicine

## 2017-11-13 ENCOUNTER — Ambulatory Visit (INDEPENDENT_AMBULATORY_CARE_PROVIDER_SITE_OTHER): Payer: Medicaid Other | Admitting: Family Medicine

## 2017-11-13 DIAGNOSIS — Z3202 Encounter for pregnancy test, result negative: Secondary | ICD-10-CM | POA: Diagnosis not present

## 2017-11-13 DIAGNOSIS — Z30430 Encounter for insertion of intrauterine contraceptive device: Secondary | ICD-10-CM | POA: Diagnosis not present

## 2017-11-13 DIAGNOSIS — O24419 Gestational diabetes mellitus in pregnancy, unspecified control: Secondary | ICD-10-CM

## 2017-11-13 DIAGNOSIS — Z1389 Encounter for screening for other disorder: Secondary | ICD-10-CM | POA: Diagnosis not present

## 2017-11-13 LAB — POCT URINE PREGNANCY: Preg Test, Ur: NEGATIVE

## 2017-11-13 MED ORDER — LEVONORGESTREL 19.5 MCG/DAY IU IUD
INTRAUTERINE_SYSTEM | Freq: Once | INTRAUTERINE | Status: AC
  Administered 2017-11-13: 12:00:00 via INTRAUTERINE

## 2017-11-14 ENCOUNTER — Encounter: Payer: Self-pay | Admitting: Family Medicine

## 2017-11-14 LAB — GLUCOSE TOLERANCE, 2 HOURS
Glucose, 2 hour: 93 mg/dL (ref 65–139)
Glucose, GTT - Fasting: 85 mg/dL (ref 65–99)

## 2017-11-19 ENCOUNTER — Ambulatory Visit: Payer: Medicaid Other | Admitting: Obstetrics and Gynecology

## 2017-11-20 ENCOUNTER — Encounter: Payer: Self-pay | Admitting: Family Medicine

## 2017-11-20 ENCOUNTER — Ambulatory Visit (INDEPENDENT_AMBULATORY_CARE_PROVIDER_SITE_OTHER): Payer: Medicaid Other | Admitting: Family Medicine

## 2017-11-20 VITALS — BP 133/87 | HR 92 | Wt 230.0 lb

## 2017-11-20 DIAGNOSIS — Z975 Presence of (intrauterine) contraceptive device: Secondary | ICD-10-CM

## 2017-11-20 NOTE — Progress Notes (Signed)
Pt states on Saturday her IUD strings were "hanging out like a tampon" but not able to feel strings today. Her husband feels the IUD during intercourse. Intercourse is uncomfortable.

## 2017-11-20 NOTE — Progress Notes (Signed)
   GYNECOLOGY CLINIC- IUD STRING CHECK PROGRESS NOTE  History:  32 y.o. [id number] here today for today for IUD string check; Mirena IUD was placed last week. Concerned that partner felt the strings during sex and that she can no longer feel strings, no concerning side effects.  The following portions of the patient's history were reviewed and updated as appropriate: allergies, current medications, past family history, past medical history, past social history, past surgical history and problem list.   Review of Systems:  Pertinent items are noted in HPI.   Objective:  Physical Exam Blood pressure 133/87, pulse 92, weight 230 lb (104.3 kg), currently breastfeeding. Gen: NAD Abd: Soft, nontender and nondistended Pelvic: Normal appearing external genitalia; normal appearing vaginal mucosa and cervix.  IUD strings visualized    Assessment & Plan:  Normal IUD check. Patient to keep IUD in place for five years; can come in for removal if she desires pregnancy within the next five years. Routine preventative health maintenance measures emphasized  Federico FlakeKimberly Niles Alyra Patty, MD  Faculty Practice  Center for [facility], [facility]

## 2017-12-11 ENCOUNTER — Encounter: Payer: Self-pay | Admitting: Family Medicine

## 2017-12-11 ENCOUNTER — Ambulatory Visit (INDEPENDENT_AMBULATORY_CARE_PROVIDER_SITE_OTHER): Payer: Medicaid Other | Admitting: Family Medicine

## 2017-12-11 DIAGNOSIS — Z30431 Encounter for routine checking of intrauterine contraceptive device: Secondary | ICD-10-CM

## 2017-12-11 NOTE — Patient Instructions (Signed)
Postpartum Hemorrhage °Postpartum hemorrhage is excessive blood loss after childbirth. Vaginal bleeding after delivery is normal and should be expected. Bleeding (lochia) will occur for several days after childbirth. This can be expected with normal vaginal deliveries and cesarean deliveries. However, postpartum hemorrhage is a potentially serious condition. °What are the causes? °This condition is caused by: °· A loss of muscle tone in the uterus after childbirth. This can be caused by: °? An abnormal placenta. °? Infection. °? Bladder swelling (distension). °· Failure to deliver all of the placenta or the retention of clots. °· Wounds in the birth canal caused by delivery of the fetus. °· Infection of the uterus. °· Infection of tissue around the fetus. °· A tear in the uterus. °· Tearing of the vagina or cervix during delivery. °· A maternal bleeding disorder that prevents blood from clotting (rare). ° °What increases the risk? °This condition is likely to develop in people who: °· Have a history of postpartum hemorrhage. °· Had a delivery that lasted longer than usual. °· Have an excess of amniotic fluid in the amniotic sac (polyhydramnios), leading the uterus to stretch too much. °· Have delivered quintuplets or more babies. °· Had high blood pressure, seizures, or coma during pregnancy. °· Had a condition called preeclampsia or eclampsia during pregnancy. °· Had problems with the placenta. °· Had complications during labor or delivery. °· Are obese. °· Are 40 years old or older. °· Are Asian or Hispanic. ° °What are the signs or symptoms? °Symptoms of this condition include: °· Passing large clots or pieces of tissue. These may be small pieces of placenta left after delivery. °· Soaking more than one sanitary pad per hour for several hours. °· Heavy, bright-red bleeding that occurs 4 days or more after delivery. °· Discharge that has a bad smell. °· An unexplained fever. °· Nausea or vomiting. °· Pain or  swelling near the vagina or perineum. °· A drop in blood pressure. °· Lightheadedness or fainting. °· Shortness of breath. °· A fast heart rate that happens with very little activity. °· Signs of shock, such as: °? Blurry vision. °? Chills. °? Dizziness. °? Weakness. ° °How is this diagnosed? °This condition may be diagnosed based on: °· Your symptoms. °· A physical exam of your perineum, vagina, cervix, and uterus. °· Tests, including: °? Blood pressure and pulse measurements. °? Blood tests. °? Blood clotting tests. °? Ultrasonography. ° °How is this treated? °Treatment for this condition depends on the severity of your symptoms. It may include: °· Uterine massage. °· Medicines to help the uterus contract. °· Blood transfusions. °· Fluids given through the vein. °· A medical procedure to compress arteries supplying the uterus. ° °Sometimes bleeding occurs if portions of the placenta are left behind in the uterus after delivery. If this happens, a curettage or scraping of the inside of the uterus must be done (rare). This usually stops the bleeding. If curettage does not stop the bleeding, surgery may be done to remove the uterus (hysterectomy), but this rarely occurs. °If bleeding is due to clotting or bleeding problems that are not related to the pregnancy, other treatments may be needed. °Follow these instructions at home: °· Limit your activity as directed by your health care provider. Your health care provider may order bed rest (getting up to go to the bathroom only) or may allow you to continue light activity. °· Keep track of the number of pads you use each day and how soaked (saturated) they are. Write down   this information. °· Do not use tampons. °· Do not douche or have sexual intercourse until your health care provider approves. °· Drink enough fluids to keep your urine clear or pale yellow. °· Get enough rest. °· Eat foods that are rich in iron, such as spinach, red meat, and legumes. °· Take any  over-the-counter and prescription medicines only as told by your health care provider. °· Keep all follow-up visits as told by your health care provider. This is important. °Get help right away if: °· You experience severe cramps in your stomach, back, or abdomen. °· You have a fever. °· You pass large clots or tissue. Save any tissue for your health care provider to look at. °· Your bleeding increases. °· You have heavy bleeding that soaks one pad per hour for 2 hours in a row. °· You faint or become dizzy, weak, or lightheaded. °· Your sanitary pad count per hour is increasing. °· You are urinating less than usual or not at all. °· You have shortness of breath. °· Your heart rate is faster than usual. °· You have sudden chest pain. °This information is not intended to replace advice given to you by your health care provider. Make sure you discuss any questions you have with your health care provider. °Document Released: 10/27/2003 Document Revised: 04/04/2016 Document Reviewed: 03/09/2016 °Elsevier Interactive Patient Education © 2018 [employer] ° °

## 2017-12-11 NOTE — Addendum Note (Signed)
Addended by: Arne ClevelandHUTCHINSON, Hisashi Amadon J on: 12/11/2017 10:05 AM   Modules accepted: Orders

## 2017-12-11 NOTE — Assessment & Plan Note (Addendum)
Check u/s for any evidence of retained products vs.other, check CBC, TSH, HCG--Has been bleeding now x 10 wks. Thought it would improve with IUD, but due to continued bleeding will proceed with w/u.

## 2017-12-11 NOTE — Progress Notes (Signed)
   Subjective:    Patient ID: Julia SpenceChantel D Kline is a 32 y.o. female presenting with String Check  on 12/11/2017  HPI: Here for IUD placement check. Additionally reports she has been bleeding since delivery. Underwent a repeat low transverse CS on 10/03/17. Came back for PPcheck 4 wks ago and reported continued bleeding. She had an IUD placed with negative UPT. Bleeding has gotten lighter but continues.   Review of Systems  Constitutional: Negative for chills and fever.  Respiratory: Negative for shortness of breath.   Cardiovascular: Negative for chest pain.  Gastrointestinal: Negative for abdominal pain, nausea and vomiting.  Genitourinary: Positive for vaginal bleeding. Negative for dysuria.  Skin: Negative for rash.      Objective:    BP (!) 135/94   Pulse 76   Ht 5\' 10"  (1.778 m)   Wt 236 lb (107 kg)   Breastfeeding? Yes   BMI 33.86 kg/m  Physical Exam  Constitutional: She is oriented to person, place, and time. She appears well-developed and well-nourished. No distress.  HENT:  Head: Normocephalic and atraumatic.  Eyes: No scleral icterus.  Neck: Neck supple.  Cardiovascular: Normal rate.  Pulmonary/Chest: Effort normal.  Abdominal: Soft.  Genitourinary: Uterus normal.  Genitourinary Comments: IUD strings noted Exam limited by body habitus  Neurological: She is alert and oriented to person, place, and time.  Skin: Skin is warm and dry.  Psychiatric: She has a normal mood and affect.        Assessment & Plan:   Problem List Items Addressed This Visit      Unprioritized   Delayed postpartum hemorrhage - Primary    Check u/s for any evidence of retained products vs.other, check CBC, TSH, HCG--Has been bleeding now x 10 wks. Thought it would improve with IUD, but due to continued bleeding will proceed with w/u.      Relevant Orders   US PELVIS (TRANSABDOMINAL ONLY)   US PELVIS TRANSVANGINAL NON-OB (TV ONLY)   CBC   TSH   B-HCG Quant    Other Visit  Diagnoses    IUD check up       in appropriate location      Total face-to-face time with patient: 15 minutes. Over 50% of encounter was spent on counseling and coordination of care. Return in about 1 month (around 01/08/2018) for a follow-up.  Reva Boresanya S Halen Antenucci 12/11/2017 9:27 AM

## 2017-12-12 LAB — CBC
Hematocrit: 31.9 % — ABNORMAL LOW (ref 34.0–46.6)
Hemoglobin: 9.4 g/dL — ABNORMAL LOW (ref 11.1–15.9)
MCH: 20.7 pg — ABNORMAL LOW (ref 26.6–33.0)
MCHC: 29.5 g/dL — ABNORMAL LOW (ref 31.5–35.7)
MCV: 70 fL — ABNORMAL LOW (ref 79–97)
Platelets: 357 10*3/uL (ref 150–379)
RBC: 4.55 x10E6/uL (ref 3.77–5.28)
RDW: 16.3 % — ABNORMAL HIGH (ref 12.3–15.4)
WBC: 6.9 10*3/uL (ref 3.4–10.8)

## 2017-12-12 LAB — TSH: TSH: 1.15 u[IU]/mL (ref 0.450–4.500)

## 2017-12-12 LAB — BETA HCG QUANT (REF LAB): hCG Quant: <1 m[IU]/mL

## 2017-12-16 ENCOUNTER — Ambulatory Visit (HOSPITAL_COMMUNITY)
Admission: RE | Admit: 2017-12-16 | Discharge: 2017-12-16 | Disposition: A | Discharge status: Home / Self Care | Payer: Medicaid Other | Source: Ambulatory Visit | Attending: Family Medicine | Admitting: Family Medicine

## 2017-12-17 ENCOUNTER — Other Ambulatory Visit: Payer: Self-pay | Admitting: *Deleted

## 2017-12-17 ENCOUNTER — Encounter: Payer: Self-pay | Admitting: Family Medicine

## 2017-12-17 MED ORDER — DICLOFENAC SODIUM 75 MG PO TBEC: 75.0000 mg | DELAYED_RELEASE_TABLET | Freq: Two times a day (BID) | ORAL | 2 refills | Status: DC

## 2017-12-17 NOTE — Progress Notes (Signed)
Per Dr Shawnie Pons, sent Diclofenac to pharmacy for pt to take for 2 weeks for AUB - Pt aware

## 2018-01-06 ENCOUNTER — Encounter: Payer: Self-pay | Admitting: Obstetrics and Gynecology

## 2018-01-06 ENCOUNTER — Ambulatory Visit (INDEPENDENT_AMBULATORY_CARE_PROVIDER_SITE_OTHER): Payer: Self-pay | Admitting: Obstetrics and Gynecology

## 2018-01-06 VITALS — BP 120/83 | HR 93 | Ht 69.0 in | Wt 237.0 lb

## 2018-01-06 DIAGNOSIS — Z975 Presence of (intrauterine) contraceptive device: Secondary | ICD-10-CM | POA: Insufficient documentation

## 2018-01-06 DIAGNOSIS — N939 Abnormal uterine and vaginal bleeding, unspecified: Secondary | ICD-10-CM | POA: Insufficient documentation

## 2018-01-06 NOTE — Progress Notes (Signed)
Obstetrics and Gynecology Established Patient Evaluation  Appointment Date: 01/06/2018  OBGYN Clinic: [facility]  Primary Care Provider: [facility], [facility]  Chief Complaint: AUB  History of Present Illness: Julia Kline is a 32 y.o. African-American [id number] (No LMP recorded.), seen for the above chief complaint.   Patient s/p 2/14 rLTCS with uncomplicated PP/post op course except for AUB. She doesn't feel that she's had a period since delivery. Pt seen on 3/27 for PP visit and had Liletta IUD inserted after negative UPT; pt states she was having bleeding then and note states pt having some clots and red bleeding and 3 pads per day. Pt seen on 4/3 for string check after she felt strings hanging out and husband could feel during sex. Exam states IUD strings were seen, no length mentioned.  Pt seen on 4/24 for regularly scheduled string check and VB still noted. IUD strings noted but no mention of length. CBC, TSH, beta quant negative and pt put on diclofenac x 2wks after labs came back.   Patient states that she hadn't had any bleeding for the past week but then noted spotting starting two days ago. The spotting is more of a brown discharge and no pain. Prior to the week with no bleeding she had more BRB with spotting and bleeding (on most days) and no pain then either. 4/29 u/s showed that IUD appeared low  She is still exclusively breastfeeding/pumping q2-3h.   Review of Systems: as noted in the History of Present Illness.  Patient Active Problem List   Diagnosis Date Noted  . Delayed postpartum hemorrhage 12/11/2017  . Obesity (BMI 30-39.9) 08/14/2017  . History of gestational diabetes 05/02/2017  . History of severe pre-eclampsia 05/02/2017  . H/O unilateral oophorectomy 05/02/2017     Past Medical History:  Past Medical History:  Diagnosis Date  . Eczema   . Gestational diabetes    glyburide  . Hernia cerebri (HCC)   . Hx of preeclampsia,  prior pregnancy, currently pregnant   . Hypertension   . Migraine   . PCOS (polycystic ovarian syndrome)   . Pregnancy induced hypertension   . Previous gestational diabetes mellitus, antepartum 05/02/2017   Normal pp testing 10/2017    Past Surgical History:  Past Surgical History:  Procedure Laterality Date  . CESAREAN SECTION  2017   Procedure: CESAREAN DELIVERY ONLY; Surgeon: Marian Sorrow, MD; Location: [facility]; Service: Obstetrics  . CESAREAN SECTION N/A 10/02/2017   Procedure: CESAREAN SECTION;  Surgeon: Tilda Burrow, MD;  Location: [facility] BIRTHING SUITES;  Service: Obstetrics;  Laterality: N/A;  . LAPAROSCOPIC OOPHERECTOMY Left   . LEEP    . OVARIAN CYST REMOVAL    . TONSILLECTOMY    . WISDOM TOOTH EXTRACTION      Past Obstetrical History:  OB History  Gravida Para Term Preterm AB Living  0 0 2  SAB TAB Ectopic Multiple Live Births  0 0 0 0 2    # Outcome Date GA Lbr Len/2nd Weight Sex Delivery Anes PTL Lv  2 Term 10/02/17 [redacted]w[redacted]d  9 lb 8.9 oz (4.335 kg) M CS-LTranv Spinal  LIV  1 Term 11/13/15 [redacted]w[redacted]d  6 lb 12 oz (3.062 kg) F CS-LTranv   LIV    Past Gynecological History: As per HPI. Pap negative 2018 in care everywhere  Social History:  Social History   Socioeconomic History  . Marital status: Married    Spouse name: Not on file  .  Number of children: Not on file  . Years of education: Not on file  . Highest education level: Not on file  Occupational History  . Not on file  Social Needs  . Financial resource strain: Not on file  . Food insecurity:    Worry: Not on file    Inability: Not on file  . Transportation needs:    Medical: Not on file    Non-medical: Not on file  Tobacco Use  . Smoking status: Never Smoker  . Smokeless tobacco: Never Used  Substance and Sexual Activity  . Alcohol use: No  . Drug use: No  . Sexual activity: Yes    Birth control/protection: None    Comment: last sex was june 2018  Lifestyle  .  Physical activity:    Days per week: Not on file    Minutes per session: Not on file  . Stress: Not on file  Relationships  . Social connections:    Talks on phone: Not on file    Gets together: Not on file    Attends religious service: Not on file    Active member of club or organization: Not on file    Attends meetings of clubs or organizations: Not on file    Relationship status: Not on file  . Intimate partner violence:    Fear of current or ex partner: Not on file    Emotionally abused: Not on file    Physically abused: Not on file    Forced sexual activity: Not on file  Other Topics Concern  . Not on file  Social History Narrative  . Not on file    Family History:  Family History  Problem Relation Age of Onset  . Diabetes Mother   . Diabetes Father   . Hypertension Maternal Grandmother      Medications Shaunte D. Roeper had no medications administered during this visit. Current Outpatient Medications  Medication Sig Dispense Refill  . diclofenac (VOLTAREN) 75 MG EC tablet Take 1 tablet (75 mg total) by mouth 2 (two) times daily with a meal. 60 tablet 2  . hydrochlorothiazide (HYDRODIURIL) 25 MG tablet Take 1 tablet (25 mg total) by mouth daily. 30 tablet 3  . Levonorgestrel (LILETTA, 52 MG, IU) by Intrauterine route.    . prenatal vitamin w/FE, FA (PRENATAL 1 + 1) 27-1 MG TABS tablet Take 1 tablet by mouth daily at 12 noon.     No current facility-administered medications for this visit.     Allergies Ancef [cefazolin]; Fentanyl; Pseudoephedrine hcl; and Morphine and related   Physical Exam:  BP 120/83   Pulse 93   Ht  (1.753 m)   Wt 237 lb (107.5 kg)   Breastfeeding? Yes   BMI 35.00 kg/m  Body mass index is 35 kg/m. General appearance: Well nourished, well developed female in no acute distress. . Neuro/Psych:  Normal mood and affect.   Laboratory: UPT negative  Radiology:  CLINICAL DATA:  Delayed postpartum hemorrhage. C-section delivery  10 weeks ago. IUD.  EXAM: TRANSABDOMINAL AND TRANSVAGINAL ULTRASOUND OF PELVIS  TECHNIQUE: Both transabdominal and transvaginal ultrasound examinations of the pelvis were performed. Transabdominal technique was performed for global imaging of the pelvis including uterus, ovaries, adnexal regions, and pelvic cul-de-sac. It was necessary to proceed with endovaginal exam following the transabdominal exam to visualize the IUD and endometrium.  COMPARISON:  None  FINDINGS: Uterus  Measurements: 8.3 x 3.8 x 5.1 cm. Retroverted. No fibroids or other mass visualized.  Endometrium  Thickness: Thin endometrium measuring approximately 5 mm in thickness. IUD visualized in the cervix with distal tip in the endometrial cavity in the lower uterine segment.  Right ovary  Measurements: 3.3 x 3.1 x 2.2 cm. Normal appearance/no adnexal mass.  Left ovary  Measurements: Not visualized, however no adnexal mass identified.  Other findings  No abnormal free fluid.  IMPRESSION: No evidence of retained products of conception.  Abnormal IUD position with distal tip in endometrial cavity in lower uterine segment.  No adnexal mass identified.   Electronically Signed   By: Myles Rosenthal M.D.   On: 12/16/2017 16:53  Assessment: pt stable  Plan:  1. Abnormal uterine bleeding (AUB) D/w her that I would recommend assuming the best, since it's very common to have AUB PP and when starting contraeption and to hope that this most recent spotting is her period but only way to know is to give it time. I told her I recommend rtc in 6wks and if this spotting resolves in a few days and no more for a month then most likely she is becoming regular but if her AUB continues when I see her back that I'd recommend removal. U/s images reviewed and uterus is very RV.   Pt amenable to plan  RTC 6wks  Cornelia Copa MD Attending [facility])

## 2018-01-06 NOTE — Progress Notes (Signed)
Stopped bleeding for 1 week and started spotting again on Saturday

## 2018-01-07 ENCOUNTER — Ambulatory Visit: Payer: Medicaid Other | Admitting: Obstetrics & Gynecology

## 2018-01-17 ENCOUNTER — Other Ambulatory Visit: Payer: Self-pay

## 2018-01-17 ENCOUNTER — Encounter (HOSPITAL_COMMUNITY): Payer: Self-pay | Admitting: Emergency Medicine

## 2018-01-17 ENCOUNTER — Emergency Department (HOSPITAL_COMMUNITY)
Admission: EM | Admit: 2018-01-17 | Discharge: 2018-01-18 | Disposition: A | Discharge status: Home / Self Care | Payer: BLUE CROSS/BLUE SHIELD | Attending: Emergency Medicine | Admitting: Emergency Medicine

## 2018-01-17 ENCOUNTER — Emergency Department (HOSPITAL_COMMUNITY): Payer: BLUE CROSS/BLUE SHIELD

## 2018-01-17 DIAGNOSIS — I10 Essential (primary) hypertension: Secondary | ICD-10-CM | POA: Insufficient documentation

## 2018-01-17 DIAGNOSIS — M25552 Pain in left hip: Secondary | ICD-10-CM | POA: Diagnosis not present

## 2018-01-17 DIAGNOSIS — Z79899 Other long term (current) drug therapy: Secondary | ICD-10-CM | POA: Diagnosis not present

## 2018-01-17 LAB — POC URINE PREG, ED: Preg Test, Ur: NEGATIVE

## 2018-01-17 MED ORDER — OXYCODONE-ACETAMINOPHEN 5-325 MG PO TABS
1.0000 | ORAL_TABLET | Freq: Once | ORAL | Status: AC
  Administered 2018-01-17: 1 via ORAL
  Filled 2018-01-17: qty 1

## 2018-01-17 NOTE — ED Provider Notes (Signed)
Patient placed in Quick Look pathway, seen and evaluated   Chief Complaint: Left hip pain  HPI:   Patient presents with 5-day history of left hip pain.  Earlier today at around 5 PM she states she was walking and felt a "pop "to the left hip and since then she has not been able to bear weight on the extremity.  Pain is constant, worsens with bending, and radiates to the left side of the back. Endorses numbness of the toes of the left foot but states "I think that is because I have been walking on it ".  She denies saddle anesthesia, IV drug use, or bowel or bladder incontinence.  No history of fevers.  No recent trauma or falls.  She did recently give birth 3 months ago via C-section.  ROS: Positive for left hip pain, low back pain  Physical Exam:   Gen: No distress  Neuro: Awake and Alert  Skin: Warm    Focused Exam: There is midline lumbar spine tenderness at around the level of L5/S1 with associated left-sided paralumbar muscle tenderness.  There is no tenderness to palpation of the left hip joint anteriorly, laterally, or posteriorly.  Significant pain is elicited with passive and active flexion of the left hip as well as passive internal and external rotation of the left hip.  4+/5 strength of left hip flexor, otherwise 5/5 strength of BLE major muscle groups.  Sensation is intact to soft touch of the bilateral lower extremity's.  Patient is able to pull herself up to a standing position but is unable to bear weight on the extremity or ambulate.  No overlying erythema noted.  2+ DP/PT pulses bilaterally.  Initiation of care has begun. The patient has been counseled on the process, plan, and necessity for staying for the completion/evaluation, and the remainder of the medical screening examination    Bennye AlmFawze, Kolby Schara A, PA-C 01/17/18 [id number]    Cathren LaineSteinl, Kevin, MD 01/17/18 [id number]

## 2018-01-17 NOTE — ED Triage Notes (Signed)
Pt reports hip pain starting Sunday, she now states she can not walk on it.  Pt reports she was walking and "it went out."  She is able to move it, reports back pain. Provider in Triage.

## 2018-01-18 MED ORDER — NAPROXEN 500 MG PO TABS: ORAL_TABLET | ORAL | 0 refills | Status: DC

## 2018-01-18 MED ORDER — KETOROLAC TROMETHAMINE 15 MG/ML IJ SOLN
30.0000 mg | Freq: Once | INTRAMUSCULAR | Status: AC
  Administered 2018-01-18: 30 mg via INTRAVENOUS
  Filled 2018-01-18: qty 2

## 2018-01-18 NOTE — Discharge Instructions (Addendum)
Use ice and heat over the painful areas for comfort. Take the medications for pain as prescribed. Follow up with Dr Lajoyce Cornersuda if you continue to have pain, he is an orthopedist.

## 2018-01-18 NOTE — ED Provider Notes (Signed)
[facility] EMERGENCY DEPARTMENT Provider Note   CSN: [id number] Arrival date & time: 01/17/18  1922   Time seen  04:40 AM  History   Chief Complaint Chief Complaint  Patient presents with  . Hip Pain    HPI Julia Kline is a 32 y.o. female.  HPI patient states May 26 she had some pain on the lateral aspect of her left hip that she describes as cramping and was initially coming and going.  She states initially it only hurt when she was walking.  It however became constant today, May 31.  She denies any known injury.  She states she is never had it before.  She denies any fever.  The only treatment she is tried is rubbing it with her hand, she is taken no over-the-counter medications.  She also complains of some low back pain for the past couple weeks and states initially started in her upper back.  She states she feels like the pains are 2 separate things and are not connected.  She relates the back pain to having an epidural with her baby who is now 84 months old.  She denies any numbness in her extremities or incontinence.  PCP Practice, [facility]   Past Medical History:  Diagnosis Date  . Eczema   . Hernia cerebri (HCC)   . History of gestational diabetes 05/02/2017   Normal pp testing 10/2017  . History of severe pre-eclampsia 05/02/2017  . Hypertension   . Migraine   . PCOS (polycystic ovarian syndrome)   . Pregnancy induced hypertension     Patient Active Problem List   Diagnosis Date Noted  . Abnormal uterine bleeding (AUB) 01/06/2018  . IUD (intrauterine device) in place 01/06/2018  . Delayed postpartum hemorrhage 12/11/2017  . Obesity (BMI 30-39.9) 08/14/2017    Past Surgical History:  Procedure Laterality Date  . CESAREAN SECTION  2017   Procedure: CESAREAN DELIVERY ONLY; Surgeon: Marian Sorrow, MD; Location: C[facility]; Service: Obstetrics  . CESAREAN SECTION N/A 10/02/2017   Procedure: CESAREAN SECTION;  Surgeon:  Tilda Burrow, MD;  Location: [facility] BIRTHING SUITES;  Service: Obstetrics;  Laterality: N/A;  . LAPAROSCOPIC OOPHERECTOMY Left   . LEEP    . OVARIAN CYST REMOVAL    . TONSILLECTOMY    . WISDOM TOOTH EXTRACTION       OB History    Gravida  2   Para  2   Term  2   Preterm  0   AB  0   Living  2     SAB  0   TAB  0   Ectopic  0   Multiple  0   Live Births  2            Home Medications    Prior to Admission medications   Medication Sig Start Date End Date Taking? Authorizing Provider  hydrochlorothiazide (HYDRODIURIL) 25 MG tablet Take 1 tablet (25 mg total) by mouth daily. 10/09/17  Yes Anyanwu, Jethro Bastos, MD  prenatal vitamin w/FE, FA (PRENATAL 1 + 1) 27-1 MG TABS tablet Take 1 tablet by mouth daily at 12 noon.   Yes [provider]  diclofenac (VOLTAREN) 75 MG EC tablet Take 1 tablet (75 mg total) by mouth 2 (two) times daily with a meal. Patient not taking: Reported on 01/18/2018 12/17/17   Reva Bores, MD  naproxen (NAPROSYN) 500 MG tablet Take 1 po BID with food prn pain 01/18/18  Devoria AlbeKnapp, Halbert Jesson, MD    Family History Family History  Problem Relation Age of Onset  . Diabetes Mother   . Diabetes Father   . Hypertension Maternal Grandmother     Social History Social History   Tobacco Use  . Smoking status: Never Smoker  . Smokeless tobacco: Never Used  Substance Use Topics  . Alcohol use: No  . Drug use: No     Allergies   Ancef [cefazolin]; Fentanyl; Pseudoephedrine hcl; and Morphine and related   Review of Systems Review of Systems  All other systems reviewed and are negative.    Physical Exam Updated Vital Signs BP (!) 120/91   Pulse 76   Temp 97.9 F (36.6 C) (Oral)   Resp 16   SpO2 100%    Vital signs normal   Physical Exam  Constitutional: She appears well-developed and well-nourished. No distress.  HENT:  Head: Normocephalic and atraumatic.  Right Ear: External ear normal.  Left Ear: External ear normal.  Nose:  Nose normal.  Eyes: Conjunctivae and EOM are normal.  Neck: Normal range of motion.  Cardiovascular: Normal rate.  Pulmonary/Chest: Effort normal. No stridor.  Musculoskeletal:       Back:       Legs: Her thoracic and lumbar spine are nontender to palpation midline but she does indicate her pain is in the midline in the sacral area.  She is nontender over the sacroiliac joints bilaterally.  She has no pain on range of motion of the lumbar spine.  When I palpate her hip she has some tenderness in the actual hip joint medially, her discomfort to palpation is in the posterior hip although she indicates the pain is on the lateral aspect of the left hip.  Nursing note and vitals reviewed.    ED Treatments / Results  Labs (all labs ordered are listed, but only abnormal results are displayed) Results for orders placed or performed during the hospital encounter of 01/17/18  POC urine preg, ED  Result Value Ref Range   Preg Test, Ur NEGATIVE NEGATIVE    Laboratory interpretation all normal except    EKG None  Radiology Dg Lumbar Spine Complete  Result Date: 01/17/2018 CLINICAL DATA:  Acute onset of lower back pain and left hip pain. Initial encounter. EXAM: LUMBAR SPINE - COMPLETE 4+ VIEW COMPARISON:  None. FINDINGS: There is no evidence of fracture or subluxation. Vertebral bodies demonstrate normal height and alignment. Intervertebral disc spaces are preserved. The visualized neural foramina are grossly unremarkable in appearance. The visualized bowel gas pattern is unremarkable in appearance; air and stool are noted within the colon. The sacroiliac joints are within normal limits. An intrauterine device is noted at the mid pelvis. IMPRESSION: No evidence of fracture or subluxation along the lumbar spine. Electronically Signed   By: Roanna RaiderJeffery  Chang M.D.   On: 01/17/2018 23:19   Dg Hip Unilat With Pelvis 2-3 Views Left  Result Date: 01/17/2018 CLINICAL DATA:  Acute onset of lower back and  left hip pain. Cannot put weight on left leg. EXAM: DG HIP (WITH OR WITHOUT PELVIS) 2-3V LEFT COMPARISON:  None. FINDINGS: There is no evidence of fracture or dislocation. Both femoral heads are seated normally within their respective acetabula. The proximal left femur appears intact. No significant degenerative change is appreciated. The sacroiliac joints are unremarkable in appearance. The visualized bowel gas pattern is grossly unremarkable in appearance. An intrauterine device is noted at the mid pelvis. IMPRESSION: No evidence of fracture or dislocation. Electronically  Signed   By: Roanna Raider M.D.   On: 01/17/2018 23:20    Procedures Procedures (including critical care time)  Medications Ordered in ED Medications  oxyCODONE-acetaminophen (PERCOCET/ROXICET) 5-325 MG per tablet 1 tablet (1 tablet Oral Given 01/17/18 2123)  ketorolac (TORADOL) 15 MG/ML injection 30 mg (30 mg Intravenous Given 01/18/18 0545)     Initial Impression / Assessment and Plan / ED Course  I have reviewed the triage vital signs and the nursing notes.  Pertinent labs & imaging results that were available during my care of the patient were reviewed by me and considered in my medical decision making (see chart for details).     Patient was given Toradol IM for her pain.  Her pain appears to be musculoskeletal without evidence of arthritis.  Recheck at 7:40 AM patient states her pain is improved.  I am going to refer her to an orthopedist if she continues to have discomfort.  She was discharged home on anti-inflammatory medication.  Final Clinical Impressions(s) / ED Diagnoses   Final diagnoses:  Left hip pain    ED Discharge Orders        Ordered    naproxen (NAPROSYN) 500 MG tablet     01/18/18 0746      Plan discharge  Devoria Albe, MD, Concha Pyo, MD 01/18/18 [phone]

## 2018-01-29 ENCOUNTER — Encounter: Payer: Self-pay | Admitting: Family Medicine

## 2018-01-29 ENCOUNTER — Ambulatory Visit: Payer: BLUE CROSS/BLUE SHIELD | Admitting: Family Medicine

## 2018-01-29 VITALS — BP 126/87 | HR 72 | Wt 245.0 lb

## 2018-01-29 DIAGNOSIS — Z30431 Encounter for routine checking of intrauterine contraceptive device: Secondary | ICD-10-CM

## 2018-01-29 NOTE — Progress Notes (Signed)
   GYNECOLOGY CLINIC- IUD STRING CHECK PROGRESS NOTE  History:  32 y.o. [id number] here today for today for IUD string check; Mirena IUD was placed  12/2017. No complaints about the Mirena-except she can feel the strings and notes cramping x2d  The following portions of the patient's history were reviewed and updated as appropriate: allergies, current medications, past family history, past medical history, past social history, past surgical history and problem list. Last pap smear on 7/18 was normal,  Review of Systems:  Pertinent items are noted in HPI.   Objective:  Physical Exam Blood pressure 126/87, pulse 72, weight 245 lb (111.1 kg), currently breastfeeding. Gen: NAD Abd: Soft, nontender and nondistended Pelvic: Normal appearing external genitalia; normal appearing vaginal mucosa and cervix.  IUD strings visualized, about 4 cm in length outside cervix- trimmed to 2cm. Device was not protruding from external os. Thin bloody discharge.   Assessment & Plan:  Normal IUD check. Strings were shortened Patient to keep IUD in place for five years; can come in for removal if she desires pregnancy within the next five years. Routine preventative health maintenance measures emphasized  Federico FlakeKimberly Niles Newton, MD  Faculty Practice  Center [facility], [facility]

## 2018-01-30 ENCOUNTER — Ambulatory Visit: Payer: BLUE CROSS/BLUE SHIELD | Admitting: Obstetrics and Gynecology

## 2018-02-10 ENCOUNTER — Other Ambulatory Visit: Payer: Self-pay | Admitting: Obstetrics & Gynecology

## 2018-02-17 ENCOUNTER — Ambulatory Visit: Payer: BLUE CROSS/BLUE SHIELD | Admitting: Family Medicine

## 2018-02-17 ENCOUNTER — Encounter: Payer: Self-pay | Admitting: Family Medicine

## 2018-02-17 DIAGNOSIS — Z975 Presence of (intrauterine) contraceptive device: Secondary | ICD-10-CM | POA: Diagnosis not present

## 2018-02-17 DIAGNOSIS — N939 Abnormal uterine and vaginal bleeding, unspecified: Secondary | ICD-10-CM | POA: Diagnosis not present

## 2018-02-17 NOTE — Patient Instructions (Signed)
DASH Eating Plan DASH stands for "Dietary Approaches to Stop Hypertension." The DASH eating plan is a healthy eating plan that has been shown to reduce high blood pressure (hypertension). It may also reduce your risk for type 2 diabetes, heart disease, and stroke. The DASH eating plan may also help with weight loss. What are tips for following this plan? General guidelines  Avoid eating more than 2,300 mg (milligrams) of salt (sodium) a day. If you have hypertension, you may need to reduce your sodium intake to 1,500 mg a day.  Limit alcohol intake to no more than 1 drink a day for nonpregnant women and 2 drinks a day for men. One drink equals 12 oz of beer, 5 oz of wine, or 1 oz of hard liquor.  Work with your health care provider to maintain a healthy body weight or to lose weight. Ask what an ideal weight is for you.  Get at least 30 minutes of exercise that causes your heart to beat faster (aerobic exercise) most days of the week. Activities may include walking, swimming, or biking.  Work with your health care provider or diet and nutrition specialist (dietitian) to adjust your eating plan to your individual calorie needs. Reading food labels  Check food labels for the amount of sodium per serving. Choose foods with less than 5 percent of the Daily Value of sodium. Generally, foods with less than 300 mg of sodium per serving fit into this eating plan.  To find whole grains, look for the word "whole" as the first word in the ingredient list. Shopping  Buy products labeled as "low-sodium" or "no salt added."  Buy fresh foods. Avoid canned foods and premade or frozen meals. Cooking  Avoid adding salt when cooking. Use salt-free seasonings or herbs instead of table salt or sea salt. Check with your health care provider or pharmacist before using salt substitutes.  Do not fry foods. Cook foods using healthy methods such as baking, boiling, grilling, and broiling instead.  Cook with  heart-healthy oils, such as olive, canola, soybean, or sunflower oil. Meal planning   Eat a balanced diet that includes: ? 5 or more servings of fruits and vegetables each day. At each meal, try to fill half of your plate with fruits and vegetables. ? Up to 6-8 servings of whole grains each day. ? Less than 6 oz of lean meat, poultry, or fish each day. A 3-oz serving of meat is about the same size as a deck of cards. One egg equals 1 oz. ? 2 servings of low-fat dairy each day. ? A serving of nuts, seeds, or beans 5 times each week. ? Heart-healthy fats. Healthy fats called Omega-3 fatty acids are found in foods such as flaxseeds and coldwater fish, like sardines, salmon, and mackerel.  Limit how much you eat of the following: ? Canned or prepackaged foods. ? Food that is high in trans fat, such as fried foods. ? Food that is high in saturated fat, such as fatty meat. ? Sweets, desserts, sugary drinks, and other foods with added sugar. ? Full-fat dairy products.  Do not salt foods before eating.  Try to eat at least 2 vegetarian meals each week.  Eat more home-cooked food and less restaurant, buffet, and fast food.  When eating at a restaurant, ask that your food be prepared with less salt or no salt, if possible. What foods are recommended? The items listed may not be a complete list. Talk with your dietitian about what   dietary choices are best for you. Grains Whole-grain or whole-wheat bread. Whole-grain or whole-wheat pasta. Brown rice. Oatmeal. Quinoa. Bulgur. Whole-grain and low-sodium cereals. Pita bread. Low-fat, low-sodium crackers. Whole-wheat flour tortillas. Vegetables Fresh or frozen vegetables (raw, steamed, roasted, or grilled). Low-sodium or reduced-sodium tomato and vegetable juice. Low-sodium or reduced-sodium tomato sauce and tomato paste. Low-sodium or reduced-sodium canned vegetables. Fruits All fresh, dried, or frozen fruit. Canned fruit in natural juice (without  added sugar). Meat and other protein foods Skinless chicken or turkey. Ground chicken or turkey. Pork with fat trimmed off. Fish and seafood. Egg whites. Dried beans, peas, or lentils. Unsalted nuts, nut butters, and seeds. Unsalted canned beans. Lean cuts of beef with fat trimmed off. Low-sodium, lean deli meat. Dairy Low-fat (1%) or fat-free (skim) milk. Fat-free, low-fat, or reduced-fat cheeses. Nonfat, low-sodium ricotta or cottage cheese. Low-fat or nonfat yogurt. Low-fat, low-sodium cheese. Fats and oils Soft margarine without trans fats. Vegetable oil. Low-fat, reduced-fat, or light mayonnaise and salad dressings (reduced-sodium). Canola, safflower, olive, soybean, and sunflower oils. Avocado. Seasoning and other foods Herbs. Spices. Seasoning mixes without salt. Unsalted popcorn and pretzels. Fat-free sweets. What foods are not recommended? The items listed may not be a complete list. Talk with your dietitian about what dietary choices are best for you. Grains Baked goods made with fat, such as croissants, muffins, or some breads. Dry pasta or rice meal packs. Vegetables Creamed or fried vegetables. Vegetables in a cheese sauce. Regular canned vegetables (not low-sodium or reduced-sodium). Regular canned tomato sauce and paste (not low-sodium or reduced-sodium). Regular tomato and vegetable juice (not low-sodium or reduced-sodium). Pickles. Olives. Fruits Canned fruit in a light or heavy syrup. Fried fruit. Fruit in cream or butter sauce. Meat and other protein foods Fatty cuts of meat. Ribs. Fried meat. Bacon. Sausage. Bologna and other processed lunch meats. Salami. Fatback. Hotdogs. Bratwurst. Salted nuts and seeds. Canned beans with added salt. Canned or smoked fish. Whole eggs or egg yolks. Chicken or turkey with skin. Dairy Whole or 2% milk, cream, and half-and-half. Whole or full-fat cream cheese. Whole-fat or sweetened yogurt. Full-fat cheese. Nondairy creamers. Whipped toppings.  Processed cheese and cheese spreads. Fats and oils Butter. Stick margarine. Lard. Shortening. Ghee. Bacon fat. Tropical oils, such as coconut, palm kernel, or palm oil. Seasoning and other foods Salted popcorn and pretzels. Onion salt, garlic salt, seasoned salt, table salt, and sea salt. Worcestershire sauce. Tartar sauce. Barbecue sauce. Teriyaki sauce. Soy sauce, including reduced-sodium. Steak sauce. Canned and packaged gravies. Fish sauce. Oyster sauce. Cocktail sauce. Horseradish that you find on the shelf. Ketchup. Mustard. Meat flavorings and tenderizers. Bouillon cubes. Hot sauce and Tabasco sauce. Premade or packaged marinades. Premade or packaged taco seasonings. Relishes. Regular salad dressings. Where to find more information:  National Heart, Lung, and Blood Institute: www.nhlbi.nih.gov  American Heart Association: www.heart.org Summary  The DASH eating plan is a healthy eating plan that has been shown to reduce high blood pressure (hypertension). It may also reduce your risk for type 2 diabetes, heart disease, and stroke.  With the DASH eating plan, you should limit salt (sodium) intake to 2,300 mg a day. If you have hypertension, you may need to reduce your sodium intake to 1,500 mg a day.  When on the DASH eating plan, aim to eat more fresh fruits and vegetables, whole grains, lean proteins, low-fat dairy, and heart-healthy fats.  Work with your health care provider or diet and nutrition specialist (dietitian) to adjust your eating plan to your individual   calorie needs. This information is not intended to replace advice given to you by your health care provider. Make sure you discuss any questions you have with your health care provider. Document Released: 07/26/2011 Document Revised: 07/30/2016 Document Reviewed: 07/30/2016 Elsevier Interactive Patient Education  2018 Elsevier Inc.  

## 2018-02-17 NOTE — Progress Notes (Signed)
Discuss blood pressure.

## 2018-02-17 NOTE — Progress Notes (Signed)
   Subjective:    Patient ID: Julia Kline is a 32 y.o. female presenting with Follow-up (AUB )  on 02/17/2018  HPI: Still with abnormal bleeding. Has had Liletta in since 10/2017. Having some irregular cycles. 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 Stopped her HCTZ for BP. BP is mildly elevated today and had pork today at lunch. Notes husband can feel IUD strings.  Review of Systems  Constitutional: Negative for chills and fever.  Respiratory: Negative for shortness of breath.   Cardiovascular: Negative for chest pain.  Gastrointestinal: Negative for abdominal pain, nausea and vomiting.  Genitourinary: Negative for dysuria.  Skin: Negative for rash.      Objective:    BP (!) 139/91   Pulse 75   Wt 246 lb 6.4 oz (111.8 kg)   BMI 36.39 kg/m  Physical Exam  Constitutional: She is oriented to person, place, and time. She appears well-developed and well-nourished. No distress.  HENT:  Head: Normocephalic and atraumatic.  Eyes: No scleral icterus.  Neck: Neck supple.  Cardiovascular: Normal rate.  Pulmonary/Chest: Effort normal.  Abdominal: Soft.  Neurological: She is alert and oriented to person, place, and time.  Skin: Skin is warm and dry.  Psychiatric: She has a normal mood and affect.        Assessment & Plan:   Problem List Items Addressed This Visit      Unprioritized   Abnormal uterine bleeding (AUB)    Likely just getting used to her Liletta. Continue to await equilibrium.       IUD (intrauterine device) in place    Strings tucked in to cervix today.         Total face-to-face time with patient: 15 minutes. Over 50% of encounter was spent on counseling and coordination of care. Return in about 5 months (around 07/20/2018).  Reva Boresanya S Koben Daman 02/17/2018 4:10 PM

## 2018-02-17 NOTE — Assessment & Plan Note (Signed)
Likely just getting used to her Liletta. Continue to await equilibrium.

## 2018-02-17 NOTE — Assessment & Plan Note (Signed)
Strings tucked in to cervix today.

## 2018-02-24 ENCOUNTER — Other Ambulatory Visit: Payer: Self-pay | Admitting: Obstetrics & Gynecology

## 2018-03-03 ENCOUNTER — Other Ambulatory Visit: Payer: Self-pay | Admitting: Obstetrics & Gynecology

## 2018-03-03 ENCOUNTER — Telehealth: Payer: Self-pay

## 2018-03-03 NOTE — Telephone Encounter (Signed)
Received message from patient concerning blood pressure concerns. No answer or voice mail to leave a message.

## 2018-04-01 ENCOUNTER — Encounter: Payer: Self-pay | Admitting: Primary Care

## 2018-04-01 ENCOUNTER — Ambulatory Visit: Payer: BLUE CROSS/BLUE SHIELD | Admitting: Primary Care

## 2018-04-01 VITALS — BP 136/96 | HR 90 | Temp 98.2°F | Ht 69.0 in | Wt 244.8 lb

## 2018-04-01 DIAGNOSIS — G43701 Chronic migraine without aura, not intractable, with status migrainosus: Secondary | ICD-10-CM | POA: Diagnosis not present

## 2018-04-01 DIAGNOSIS — G43909 Migraine, unspecified, not intractable, without status migrainosus: Secondary | ICD-10-CM | POA: Insufficient documentation

## 2018-04-01 DIAGNOSIS — O1003 Pre-existing essential hypertension complicating the puerperium: Secondary | ICD-10-CM | POA: Diagnosis not present

## 2018-04-01 MED ORDER — LABETALOL HCL 100 MG PO TABS: ORAL_TABLET | ORAL | 0 refills | Status: DC

## 2018-04-01 NOTE — Assessment & Plan Note (Signed)
Chronic since mid 20's, more frequent recently. Daily headaches.   Suspect blood pressure may be contributing to headaches, will start with better BP control first. She is breastfeeding so frequent headache/migraine treatment will be difficult.  Follow up in 2-3 weeks for BP check.

## 2018-04-01 NOTE — Assessment & Plan Note (Signed)
On HCTZ per GYN for 6 months. BP uncontrolled, even on home readings. Suspect this could be contributing to headaches.   Rx for labetalol 100 mg BID sent to pharmacy. Continue HCTZ 25 mg as prescribed. Follow up in 2-3 weeks for BP check.

## 2018-04-01 NOTE — Patient Instructions (Signed)
Start labetalol 100 mg tablets for blood pressure. Take 1 tablet by mouth twice daily.   Continue hydrochlorothiazide 25 mg for blood pressure as previously prescribed.  Start monitoring your blood pressure daily, around the same time of day, for the next 2-3 weeks.  Ensure that you have rested for 30 minutes prior to checking your blood pressure. Record your readings and bring them to your next visit.  Schedule a follow up visit in 2-3 weeks for blood pressure check.  It was a pleasure to meet you today! Please don't hesitate to call or message me with any questions. Welcome to [facility]!   DASH Eating Plan DASH stands for "Dietary Approaches to Stop Hypertension." The DASH eating plan is a healthy eating plan that has been shown to reduce high blood pressure (hypertension). It may also reduce your risk for type 2 diabetes, heart disease, and stroke. The DASH eating plan may also help with weight loss. What are tips for following this plan? General guidelines  Avoid eating more than 2,300 mg (milligrams) of salt (sodium) a day. If you have hypertension, you may need to reduce your sodium intake to 1,500 mg a day.  Limit alcohol intake to no more than 1 drink a day for nonpregnant women and 2 drinks a day for men. One drink equals 12 oz of beer, 5 oz of wine, or 1 oz of hard liquor.  Work with your health care provider to maintain a healthy body weight or to lose weight. Ask what an ideal weight is for you.  Get at least 30 minutes of exercise that causes your heart to beat faster (aerobic exercise) most days of the week. Activities may include walking, swimming, or biking.  Work with your health care provider or diet and nutrition specialist (dietitian) to adjust your eating plan to your individual calorie needs. Reading food labels  Check food labels for the amount of sodium per serving. Choose foods with less than 5 percent of the Daily Value of sodium. Generally, foods with less than  300 mg of sodium per serving fit into this eating plan.  To find whole grains, look for the word "whole" as the first word in the ingredient list. Shopping  Buy products labeled as "low-sodium" or "no salt added."  Buy fresh foods. Avoid canned foods and premade or frozen meals. Cooking  Avoid adding salt when cooking. Use salt-free seasonings or herbs instead of table salt or sea salt. Check with your health care provider or pharmacist before using salt substitutes.  Do not fry foods. Cook foods using healthy methods such as baking, boiling, grilling, and broiling instead.  Cook with heart-healthy oils, such as olive, canola, soybean, or sunflower oil. Meal planning   Eat a balanced diet that includes: ? 5 or more servings of fruits and vegetables each day. At each meal, try to fill half of your plate with fruits and vegetables. ? Up to 6-8 servings of whole grains each day. ? Less than 6 oz of lean meat, poultry, or fish each day. A 3-oz serving of meat is about the same size as a deck of cards. One egg equals 1 oz. ? 2 servings of low-fat dairy each day. ? A serving of nuts, seeds, or beans 5 times each week. ? Heart-healthy fats. Healthy fats called Omega-3 fatty acids are found in foods such as flaxseeds and coldwater fish, like sardines, salmon, and mackerel.  Limit how much you eat of the following: ? Canned or prepackaged foods. ?  Food that is high in trans fat, such as fried foods. ? Food that is high in saturated fat, such as fatty meat. ? Sweets, desserts, sugary drinks, and other foods with added sugar. ? Full-fat dairy products.  Do not salt foods before eating.  Try to eat at least 2 vegetarian meals each week.  Eat more home-cooked food and less restaurant, buffet, and fast food.  When eating at a restaurant, ask that your food be prepared with less salt or no salt, if possible. What foods are recommended? The items listed may not be a complete list. Talk with  your dietitian about what dietary choices are best for you. Grains Whole-grain or whole-wheat bread. Whole-grain or whole-wheat pasta. Brown rice. [place]row. Bulgur. Whole-grain and low-sodium cereals. Pita bread. Low-fat, low-sodium crackers. Whole-wheat flour tortillas. Vegetables Fresh or frozen vegetables (raw, steamed, roasted, or grilled). Low-sodium or reduced-sodium tomato and vegetable juice. Low-sodium or reduced-sodium tomato sauce and tomato paste. Low-sodium or reduced-sodium canned vegetables. Fruits All fresh, dried, or frozen fruit. Canned fruit in natural juice (without added sugar). Meat and other protein foods Skinless chicken or [place]. Ground chicken or [place]. Pork with fat trimmed off. Fish and seafood. Egg whites. Dried beans, peas, or lentils. Unsalted nuts, nut butters, and seeds. Unsalted canned beans. Lean cuts of beef with fat trimmed off. Low-sodium, lean deli meat. Dairy Low-fat (1%) or fat-free (skim) milk. Fat-free, low-fat, or reduced-fat cheeses. Nonfat, low-sodium ricotta or cottage cheese. Low-fat or nonfat yogurt. Low-fat, low-sodium cheese. Fats and oils Soft margarine without trans fats. Vegetable oil. Low-fat, reduced-fat, or light mayonnaise and salad dressings (reduced-sodium). Canola, safflower, olive, soybean, and sunflower oils. Avocado. Seasoning and other foods Herbs. Spices. Seasoning mixes without salt. Unsalted popcorn and pretzels. Fat-free sweets. What foods are not recommended? The items listed may not be a complete list. Talk with your dietitian about what dietary choices are best for you. Grains Baked goods made with fat, such as croissants, muffins, or some breads. Dry pasta or rice meal packs. Vegetables Creamed or fried vegetables. Vegetables in a cheese sauce. Regular canned vegetables (not low-sodium or reduced-sodium). Regular canned tomato sauce and paste (not low-sodium or reduced-sodium). Regular tomato and vegetable juice  (not low-sodium or reduced-sodium). Angie Fava. Olives. Fruits Canned fruit in a light or heavy syrup. Fried fruit. Fruit in cream or butter sauce. Meat and other protein foods Fatty cuts of meat. Ribs. Fried meat. Berniece Salines. Sausage. Bologna and other processed lunch meats. Salami. Fatback. Hotdogs. Bratwurst. Salted nuts and seeds. Canned beans with added salt. Canned or smoked fish. Whole eggs or egg yolks. Chicken or [place] with skin. Dairy Whole or 2% milk, cream, and half-and-half. Whole or full-fat cream cheese. Whole-fat or sweetened yogurt. Full-fat cheese. Nondairy creamers. Whipped toppings. Processed cheese and cheese spreads. Fats and oils Butter. Stick margarine. Lard. Shortening. Ghee. Bacon fat. Tropical oils, such as coconut, palm kernel, or palm oil. Seasoning and other foods Salted popcorn and pretzels. Onion salt, garlic salt, seasoned salt, table salt, and sea salt. Worcestershire sauce. Tartar sauce. Barbecue sauce. Teriyaki sauce. Soy sauce, including reduced-sodium. Steak sauce. Canned and packaged gravies. Fish sauce. Oyster sauce. Cocktail sauce. Horseradish that you find on the shelf. Ketchup. Mustard. Meat flavorings and tenderizers. Bouillon cubes. Hot sauce and Tabasco sauce. Premade or packaged marinades. Premade or packaged taco seasonings. Relishes. Regular salad dressings. Where to find more information:  National Heart, Lung, and [place]: https://wilson-eaton.com/  American Heart Association: www.heart.org Summary  The DASH eating plan is  a healthy eating plan that has been shown to reduce high blood pressure (hypertension). It may also reduce your risk for type 2 diabetes, heart disease, and stroke.  With the DASH eating plan, you should limit salt (sodium) intake to 2,300 mg a day. If you have hypertension, you may need to reduce your sodium intake to 1,500 mg a day.  When on the DASH eating plan, aim to eat more fresh fruits and vegetables, whole grains, lean  proteins, low-fat dairy, and heart-healthy fats.  Work with your health care provider or diet and nutrition specialist (dietitian) to adjust your eating plan to your individual calorie needs. This information is not intended to replace advice given to you by your health care provider. Make sure you discuss any questions you have with your health care provider. Document Released: 07/26/2011 Document Revised: 07/30/2016 Document Reviewed: 07/30/2016 Elsevier Interactive Patient Education  [employer]2018 Elsevier Inc.

## 2018-04-01 NOTE — Progress Notes (Signed)
Subjective:    Patient ID: Julia Kline, female    DOB: 02-25-1986, 32 y.o.   MRN: [id number]  HPI  Julia Kline is a 32 year old female who presents today to establish care and discuss the problems mentioned below. Will obtain old records.  1) Essential Hypertension: Diagnosed postpartum with her son who is now 766 months old. Currently managed on HCTZ 25 mg. She did not take her HCTZ this morning. She checks her BP infrequently and will get readings of 130's-150's/90's.   BP Readings from Last 3 Encounters:  04/01/18 (!) 136/96  02/17/18 (!) 139/91  01/29/18 126/87   2) Migraine: Endorses daily headaches with "migraines" twice weekly. Her headaches are located around her head that feels like "a band" around her head. "Migraines" occur to the bilateral frontal lobes with photophobia, nausea, dizziness; occur 1-2 times weekly on average. History of "migraines" and headaches for 4 weeks total, but has experienced them intermittently since her mid 20's.   She has a family history of migraines and cluster headaches in her mother. Denies family history of brain aneurysm or tumors.   Review of Systems  Eyes: Negative for visual disturbance.  Respiratory: Negative for shortness of breath.   Cardiovascular: Negative for chest pain.  Neurological: Positive for headaches. Negative for dizziness.       Past Medical History:  Diagnosis Date  . Eczema   . Hernia cerebri (HCC)   . History of gestational diabetes 05/02/2017   Normal pp testing 10/2017  . History of severe pre-eclampsia 05/02/2017  . Hypertension   . Migraine   . PCOS (polycystic ovarian syndrome)   . Pregnancy induced hypertension      Social History   Socioeconomic History  . Marital status: Married    Spouse name: Not on file  . Number of children: Not on file  . Years of education: Not on file  . Highest education level: Not on file  Occupational History  . Not on file  Social Needs  . Financial resource  strain: Not on file  . Food insecurity:    Worry: Not on file    Inability: Not on file  . Transportation needs:    Medical: Not on file    Non-medical: Not on file  Tobacco Use  . Smoking status: Never Smoker  . Smokeless tobacco: Never Used  Substance and Sexual Activity  . Alcohol use: No  . Drug use: No  . Sexual activity: Yes    Birth control/protection: None    Comment: last sex was june 2018  Lifestyle  . Physical activity:    Days per week: Not on file    Minutes per session: Not on file  . Stress: Not on file  Relationships  . Social connections:    Talks on phone: Not on file    Gets together: Not on file    Attends religious service: Not on file    Active member of club or organization: Not on file    Attends meetings of clubs or organizations: Not on file    Relationship status: Not on file  . Intimate partner violence:    Fear of current or ex partner: Not on file    Emotionally abused: Not on file    Physically abused: Not on file    Forced sexual activity: Not on file  Other Topics Concern  . Not on file  Social History Narrative   Married.   2 children.   Works  at [employer].    Past Surgical History:  Procedure Laterality Date  . CESAREAN SECTION  2017   Procedure: CESAREAN DELIVERY ONLY; Surgeon: Marian SorrowKalpen Navin Patel, MD; [facility]; Service: Obstetrics  . CESAREAN SECTION N/A 10/02/2017   Procedure: CESAREAN SECTION;  Surgeon: Tilda BurrowFerguson, John V, MD;  [facility];  Service: Obstetrics;  Laterality: N/A;  . LAPAROSCOPIC OOPHERECTOMY Left   . LEEP    . OVARIAN CYST REMOVAL    . TONSILLECTOMY    . WISDOM TOOTH EXTRACTION      Family History  Problem Relation Age of Onset  . Diabetes Mother   . Migraines Mother   . Diabetes Father   . Hypertension Father   . Hypertension Maternal Grandmother     Allergies  Allergen Reactions  . Ancef [Cefazolin] Hives and Swelling    Facial and neck swelling; Had  reaction after receiving fentanyl and ancef in the OR  . Fentanyl Hives and Swelling    Facial and neck swelling; Had reaction after receiving fentanyl and ancef in the OR  . Pseudoephedrine Hcl Other (See Comments)  . Morphine And Related Rash    Current Outpatient Medications on File Prior to Visit  Medication Sig Dispense Refill  . hydrochlorothiazide (HYDRODIURIL) 25 MG tablet Take 1 tablet (25 mg total) by mouth daily. 30 tablet 3  . prenatal vitamin w/FE, FA (PRENATAL 1 + 1) 27-1 MG TABS tablet Take 1 tablet by mouth daily at 12 noon.    . naproxen (NAPROSYN) 500 MG tablet Take 1 po BID with food prn pain (Patient not taking: Reported on 01/29/2018) 30 tablet 0   No current facility-administered medications on file prior to visit.     BP (!) 136/96   Pulse 90   Temp 98.2 F (36.8 C) (Oral)   Ht 5\' 9"  (1.753 m)   Wt 244 lb 12 oz (111 kg)   LMP 02/02/2018   SpO2 97%   Breastfeeding? Yes   BMI 36.14 kg/m    Objective:   Physical Exam  Constitutional: She appears well-nourished.  Neck: Neck supple.  Cardiovascular: Normal rate and regular rhythm.  Respiratory: Effort normal and breath sounds normal.  Skin: Skin is warm and dry.  Psychiatric: She has a normal mood and affect.           Assessment & Plan:

## 2018-04-14 ENCOUNTER — Ambulatory Visit: Payer: BLUE CROSS/BLUE SHIELD | Admitting: Primary Care

## 2018-04-24 ENCOUNTER — Encounter: Payer: Self-pay | Admitting: Primary Care

## 2018-04-24 ENCOUNTER — Ambulatory Visit (INDEPENDENT_AMBULATORY_CARE_PROVIDER_SITE_OTHER): Payer: BLUE CROSS/BLUE SHIELD | Admitting: Primary Care

## 2018-04-24 DIAGNOSIS — G43701 Chronic migraine without aura, not intractable, with status migrainosus: Secondary | ICD-10-CM

## 2018-04-24 DIAGNOSIS — O1003 Pre-existing essential hypertension complicating the puerperium: Secondary | ICD-10-CM

## 2018-04-24 MED ORDER — LABETALOL HCL 100 MG PO TABS: ORAL_TABLET | ORAL | 1 refills | Status: DC

## 2018-04-24 NOTE — Progress Notes (Signed)
Subjective:    Patient ID: Julia Kline, female    DOB: Feb 14, 1986, 32 y.o.   MRN: [id number]  HPI  Julia Kline is a 32 year old female who presents today for follow up of post partum hypertension.   She was last evaluated in mid August with uncontrolled BP despite treatment with HCTZ that was prescribed by her gynecologist. We added labetalol 100 mg BID during that visit.   BP Readings from Last 3 Encounters:  04/24/18 124/84  04/01/18 (!) 136/96  02/17/18 (!) 139/91   Since her last visit she's checking her BP occasionally and is getting readings of 118/79, 126/83, 130/89. She continues to breast feed and recently started pumping. She has noticed some reduced flow from the right breast, this began 2 days ago.   She has noticed some improvement in headaches, but does continue to feel pressure to the left parietal region. History of migraines during college, intermittent headaches since. No headaches during recent pregnancy, but since post partum has noticed a return of headaches. She thinks the labetalol has helped.   Review of Systems  Eyes: Negative for visual disturbance.  Respiratory: Negative for shortness of breath.   Cardiovascular: Negative for chest pain.  Neurological: Positive for headaches. Negative for dizziness.       Past Medical History:  Diagnosis Date  . Eczema   . Hernia cerebri (HCC)   . History of gestational diabetes 05/02/2017   Normal pp testing 10/2017  . History of severe pre-eclampsia 05/02/2017  . Hypertension   . Migraine   . PCOS (polycystic ovarian syndrome)   . Pregnancy induced hypertension      Social History   Socioeconomic History  . Marital status: Married    Spouse name: Not on file  . Number of children: Not on file  . Years of education: Not on file  . Highest education level: Not on file  Occupational History  . Not on file  Social Needs  . Financial resource strain: Not on file  . Food insecurity:    Worry: Not on file     Inability: Not on file  . Transportation needs:    Medical: Not on file    Non-medical: Not on file  Tobacco Use  . Smoking status: Never Smoker  . Smokeless tobacco: Never Used  Substance and Sexual Activity  . Alcohol use: No  . Drug use: No  . Sexual activity: Yes    Birth control/protection: None    Comment: last sex was june 2018  Lifestyle  . Physical activity:    Days per week: Not on file    Minutes per session: Not on file  . Stress: Not on file  Relationships  . Social connections:    Talks on phone: Not on file    Gets together: Not on file    Attends religious service: Not on file    Active member of club or organization: Not on file    Attends meetings of clubs or organizations: Not on file    Relationship status: Not on file  . Intimate partner violence:    Fear of current or ex partner: Not on file    Emotionally abused: Not on file    Physically abused: Not on file    Forced sexual activity: Not on file  Other Topics Concern  . Not on file  Social History Narrative   Married.   2 children.   Works at [employer].  Past Surgical History:  Procedure Laterality Date  . CESAREAN SECTION  2017   Procedure: CESAREAN DELIVERY ONLY; Surgeon: Marian Sorrow, MD; Location: [facility]; Service: Obstetrics  . CESAREAN SECTION N/A 10/02/2017   Procedure: CESAREAN SECTION;  Surgeon: Tilda Burrow, MD;  Location: [facility];  Service: Obstetrics;  Laterality: N/A;  . LAPAROSCOPIC OOPHERECTOMY Left   . LEEP    . OVARIAN CYST REMOVAL    . TONSILLECTOMY    . WISDOM TOOTH EXTRACTION      Family History  Problem Relation Age of Onset  . Diabetes Mother   . Migraines Mother   . Diabetes Father   . Hypertension Father   . Hypertension Maternal Grandmother   . Diabetes Maternal Grandmother   . Diabetes Maternal Grandfather   . COPD Maternal Grandfather   . Diabetes Paternal Grandmother   . Hypertension Paternal Grandmother     . Diabetes Paternal Grandfather   . Hypertension Paternal Grandfather   . Heart attack Paternal Grandfather   . Asthma Sister     Allergies  Allergen Reactions  . Ancef [Cefazolin] Hives and Swelling    Facial and neck swelling; Had reaction after receiving fentanyl and ancef in the OR  . Fentanyl Hives and Swelling    Facial and neck swelling; Had reaction after receiving fentanyl and ancef in the OR  . Pseudoephedrine Hcl Other (See Comments)  . Morphine And Related Rash    Current Outpatient Medications on File Prior to Visit  Medication Sig Dispense Refill  . hydrochlorothiazide (HYDRODIURIL) 25 MG tablet Take 1 tablet (25 mg total) by mouth daily. 30 tablet 3  . prenatal vitamin w/FE, FA (PRENATAL 1 + 1) 27-1 MG TABS tablet Take 1 tablet by mouth daily at 12 noon.     No current facility-administered medications on file prior to visit.     BP 124/84   Pulse 73   Temp 98 F (36.7 C) (Oral)   Ht 5\' 9"  (1.753 m)   Wt 249 lb 8 oz (113.2 kg)   LMP 02/02/2018   SpO2 99%   BMI 36.84 kg/m    Objective:   Physical Exam  Constitutional: She appears well-nourished.  Neck: Neck supple.  Cardiovascular: Normal rate and regular rhythm.  Respiratory: Effort normal and breath sounds normal.  Skin: Skin is warm and dry.           Assessment & Plan:

## 2018-04-24 NOTE — Assessment & Plan Note (Signed)
Some improvement which could be secondary to reduction in BP with addition of labetalol.   Headache treatment is complicated as she is breast feeding. Recommended she give the labetalol several more weeks. Also discussed occasional ibuprofen use for breakthrough headaches.   Consider treatment once she's finished breast feeding.

## 2018-04-24 NOTE — Patient Instructions (Signed)
Continue hydrochlorothiazide tablets for blood pressure. Continue labetalol 100 mg twice daily for blood pressure.  Okay to take Ibuprofen 600 mg as needed for headaches. Use sparingly.  Please update me if your headaches persist and/or progress.  It was a pleasure to see you today!

## 2018-04-24 NOTE — Assessment & Plan Note (Signed)
Improved since addition of labetalol 100 mg BID. She will monitor milk production from right breast and report continued problems. Consider discontinuing HCTZ if this is problematic.   Continue current regimen for now.

## 2018-05-20 ENCOUNTER — Telehealth: Payer: Self-pay

## 2018-05-20 NOTE — Telephone Encounter (Signed)
PLEASE NOTE: All timestamps contained within this report are represented as [place] Standard Time. CONFIDENTIALTY NOTICE: This fax transmission is intended only for the addressee. It contains information that is legally privileged, confidential or otherwise protected from use or disclosure. If you are not the intended recipient, you are strictly prohibited from reviewing, disclosing, copying using or disseminating any of this information or taking any action in reliance on or regarding this information. If you have received this fax in error, please notify us immediately by telephone so that we can arrange for its return to [place]. Phone: [phone], Toll-Free: [phone], Fax: [phone] Page: 1 of 2 Call Id: [id number] [facility] Night - Client TELEPHONE ADVICE RECORD [facility] Patient Name: Julia Kline Gender: Female DOB: 1986-06-26 Age: 32 Y 7 M 6 D Return Phone Number: [phone] (Primary), [phone] (Secondary) Address: City/State/Zip: [place] [address] Client [facility] Night - Client Client Site [facility] - Night Physician AA - PHYSICIAN, Crissie Figures- MD Contact Type Call Who Is Calling Patient / Member / Family / Caregiver Call Type Triage / Clinical Relationship To Patient Self Return Phone Number [phone] (Primary) Chief Complaint Facial Swelling Reason for Call Symptomatic / Request for Health Information Initial Comment Caller states she had cold sore yesterday, this morning lip swollen, breastfeeding, and wondering what to take for it. MD: unknown. Translation No Nurse Assessment Nurse: Anner Crete, RN, Olegario Messier Date/Time (Eastern Time): 05/20/2018 9:54:41 AM Confirm and document reason for call. If symptomatic, describe symptoms. ---Caller states she had cold sore yesterday, this morning her lip swollen, she is breastfeeding, and wondering what to take for it. Does the patient have any  new or worsening symptoms? ---Yes Will a triage be completed? ---Yes Related visit to physician within the last 2 weeks? ---No Does the PT have any chronic conditions? (i.e. diabetes, asthma, this includes High risk factors for pregnancy, etc.) ---Yes List chronic conditions. ---HTN Is the patient pregnant or possibly pregnant? (Ask all females between the ages of 32-55) ---No Is this a behavioral health or substance abuse call? ---No Guidelines Guideline Title Affirmed Question Affirmed Notes Nurse Date/Time (Eastern Time) Cold Sores (Fever Blisters) Cold sores without complications Wells, RN, Olegario Messier 05/20/2018 9:55:21 AM Disp. Time Lamount Cohen Time) Disposition Final User 05/20/2018 10:01:32 AM Home Care Yes Anner Crete, RN, Sammuel Bailiff Disagree/Comply Comply Caller Understands Yes PLEASE NOTE: All timestamps contained within this report are represented as [place] Standard Time. CONFIDENTIALTY NOTICE: This fax transmission is intended only for the addressee. It contains information that is legally privileged, confidential or otherwise protected from use or disclosure. If you are not the intended recipient, you are strictly prohibited from reviewing, disclosing, copying using or disseminating any of this information or taking any action in reliance on or regarding this information. If you have received this fax in error, please notify us immediately by telephone so that we can arrange for its return to [place]. Phone: [phone], Toll-Free: [phone], Fax: [phone] Page: 2 of 2 Call Id: [id number] PreDisposition Call Doctor Care Advice Given Per Guideline HOME CARE: * You should be able to treat this at home. CONTAGIOUSNESS: * SPREAD: Herpes from cold sores is contagious to other people. Discourage picking or rubbing the sore. Don't open the blisters. Wash your hands frequently. The cold sores are contagious until dry (5-7 days). Most cold sore sufferers note a tingling in the lip before  the sore appears (prodromal phase). Patients are also contagious during this time period. PREVENTION: Since  cold sores are often triggered by exposure to intense sunlight, use a lip balm containing a sunscreen (SPF 30 or higher). EXPECTED COURSE: The pain typically subsides over 4-5 days and the sores typically heal over a 7-10 day period. On average, patients note recurrences 2-3 times per year. CALL BACK IF: * Sores look infected * Sores last over 2 weeks. CARE ADVICE given per Fever Blisters of Lip (Cold Sores) (Adult) guideline. OTC DOCOSANOL 10% CREAM: * Apply OTC docosanol cream (trade name Abreva) to the cold sore 5 times daily until healing occurs. Begin using this cream as soon as you first sense the beginning of an outbreak. * Docosanol can reduce the pain and shorten the course by a day or two. * MOUTH: Since the blisters and mouth secretions are contagious, avoid kissing other people during this time. Avoid sharing drinking glasses, eating utensils, or razors. * SEX: Avoid oral sex during this time. Herpes from sores on your mouth can spread to your partner's genital area. Comments User: Alphonzo Cruise, RN Date/Time Lamount Cohen Time): 05/20/2018 10:03:58 AM I did advise caller to check in with her OB MD related to taking medication for cold sores while breastfeeding; http://www.pena.net/ search?q=abreva+during+breastfeeding No studies have been done on Abreva and breastfeeding, and the manufacturer recommends that women talk to their healthcare providers about this. Most healthcare providers consider the drug safe to take when breastfeeding; however, the exact effects it may have on a nursing infant are unknown.

## 2018-05-20 NOTE — Telephone Encounter (Signed)
Spoken and notified patient of Kate Clark's comments. Patient verbalized understanding.  

## 2018-05-20 NOTE — Telephone Encounter (Signed)
I recommend she start with topical treatment over the counter such as Abreva. If no improvement then have her seen in our clinic. Thanks.

## 2018-07-01 ENCOUNTER — Encounter: Payer: Self-pay | Admitting: Radiology

## 2018-07-07 ENCOUNTER — Telehealth: Payer: Self-pay

## 2018-07-07 NOTE — Telephone Encounter (Signed)
Patient contacted on call [facility] [employer] over the weekend to report inability to remove contact from her eye.  Contact is "stuck" with after falling asleep with it in and the eye is now red.  [facility] instructed patient to contact her eye doctor.    By the time their triage nurse had called her back, patient had already spoken with her eye doctor's office and they have made her an appointment for today 11/18 to be evaluated.

## 2018-07-07 NOTE — Telephone Encounter (Signed)
Noted  

## 2018-08-15 ENCOUNTER — Encounter: Payer: Self-pay | Admitting: *Deleted

## 2018-08-20 DIAGNOSIS — U071 COVID-19: Secondary | ICD-10-CM

## 2018-08-20 HISTORY — DX: COVID-19: U07.1

## 2018-08-22 ENCOUNTER — Telehealth: Payer: Self-pay

## 2018-08-22 NOTE — Telephone Encounter (Signed)
Please call patient:  Based off of my records she has been on hydrochlorothiazide for nearly 1 year and labetalol for the last 3 months.  I recommend she avoid shrimp given her allergy, monitor her symptoms by recording when she experiences hives, and be seen in our office for reevaluation.  Please schedule.

## 2018-08-22 NOTE — Telephone Encounter (Signed)
Pt requesting information on taking the 2 BP meds, HCTZ and labetalol. On 08/12/18 pt broke out with hives on both legs and arms within 10 mins after taking labetalol and HCTZ in AM. Pt took Benadryl and hives go away but when takes HCTZ and labetalol the next day the hives come back. Pt is not sure if the hives come out when takes 2nd dose of labetalol.pt said no swelling in neck, throat, mouth or tongue and no difficulty breathing. The HCTZ was given by Dr Macon Large # 30 x 3 on 10/09/17. Pt said she stopped HCTZ for awhile;(pt cannot remember when she stopped exactly) but pt restarted HCTZ at Thanksgiving. Pt also said she is allergic to shrimp and pt ate shrimp x 2 this week and had swelling of throat both times. Pt said she took Benadryl and was OK. Pt request cb.[facility].

## 2018-08-25 NOTE — Telephone Encounter (Signed)
Tried to call patient, a couple times this morning. Could not leave message since voicemail.

## 2018-08-26 NOTE — Telephone Encounter (Signed)
Spoken and notified patient of Kate Clark's comments. Patient verbalized understanding.  

## 2018-09-05 ENCOUNTER — Ambulatory Visit: Payer: BLUE CROSS/BLUE SHIELD | Admitting: Primary Care

## 2018-09-21 IMAGING — US US TRANSVAGINAL NON-OB
1 series · 15 of 25 positions shown · non-contrast
Comparison: None

CLINICAL DATA: Delayed postpartum hemorrhage. C-section delivery 10
weeks ago. IUD.

EXAM:
TRANSABDOMINAL AND TRANSVAGINAL ULTRASOUND OF PELVIS
TECHNIQUE: Both transabdominal and transvaginal ultrasound examinations of the
pelvis were performed. Transabdominal technique was performed for
global imaging of the pelvis including uterus, ovaries, adnexal
regions, and pelvic cul-de-sac. It was necessary to proceed with
endovaginal exam following the transabdominal exam to visualize the
IUD and endometrium.

[Series 1: us transvaginal non-ob · 82 acquisitions, 15 frames shown]
[im 1/82]
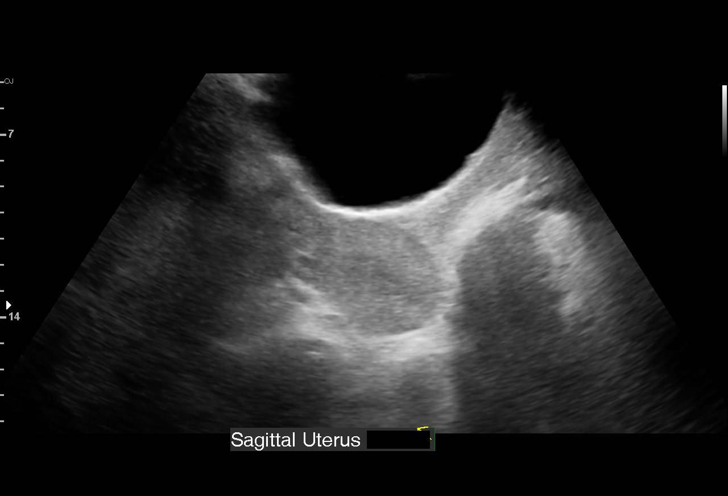
[im 7/82]
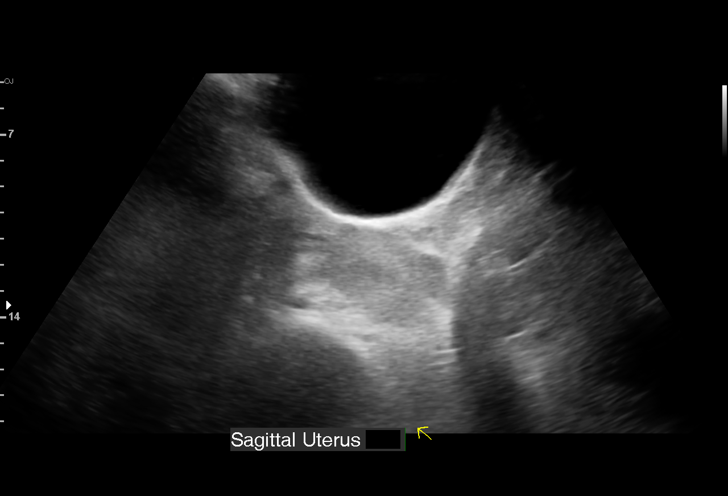
[im 14/82]
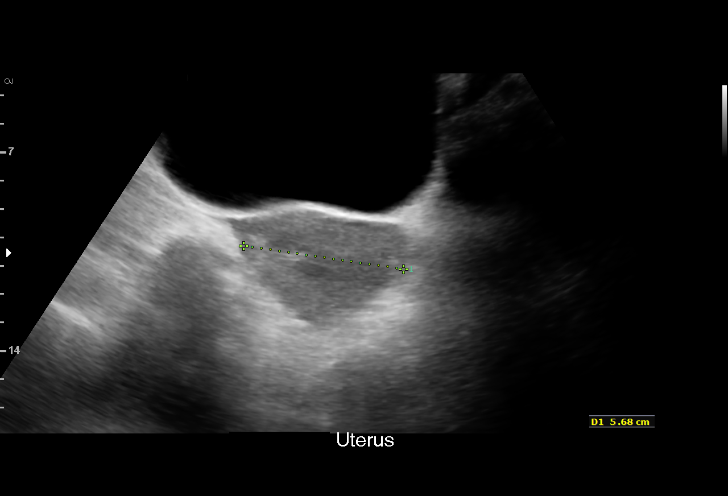
[im 17/82]
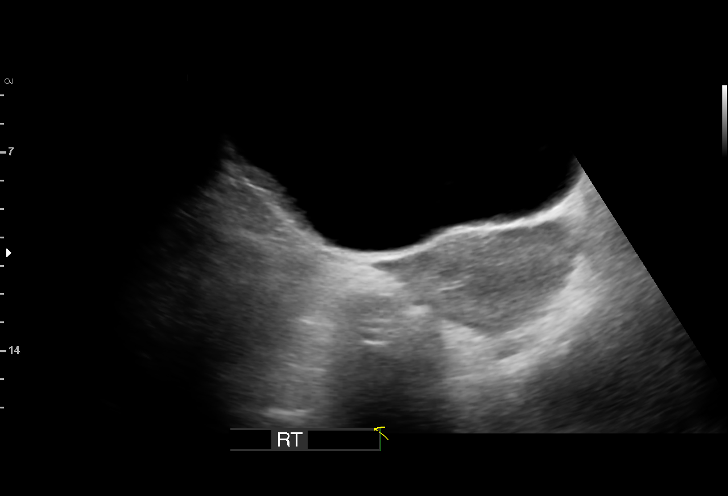
[im 24/82]
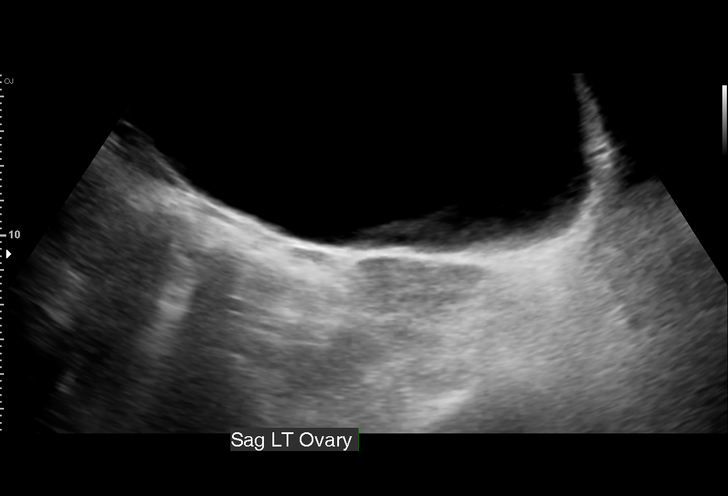
[im 31/82]
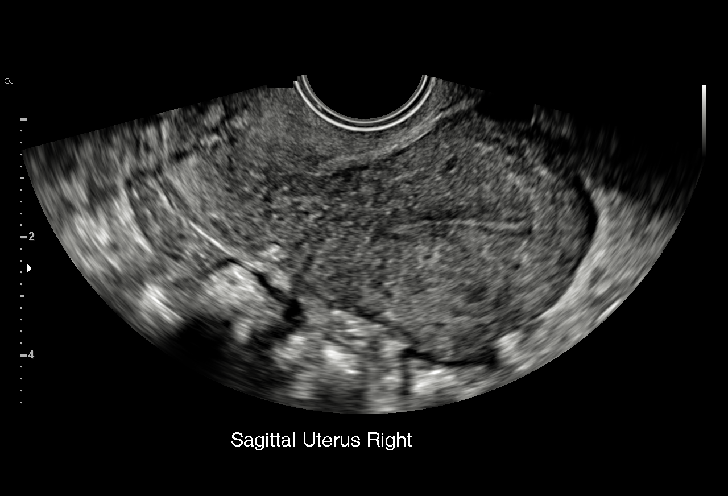
[im 34/82]
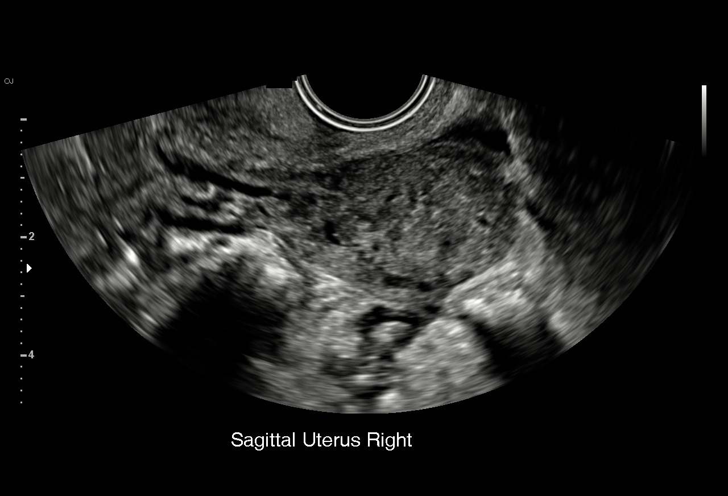
[im 41/82]
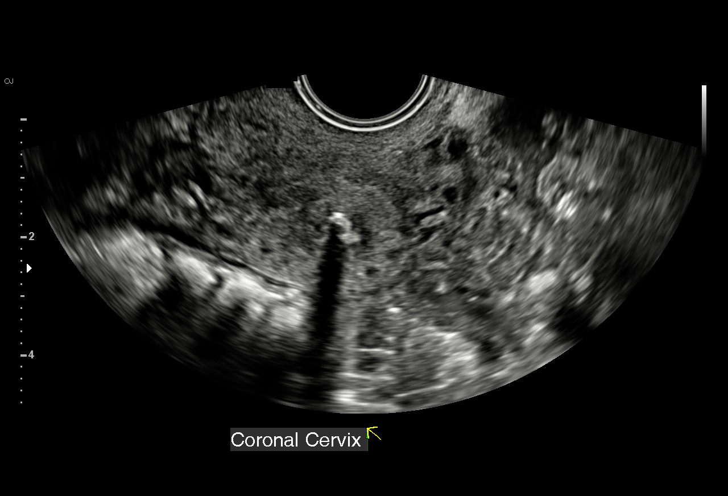
[im 48/82]
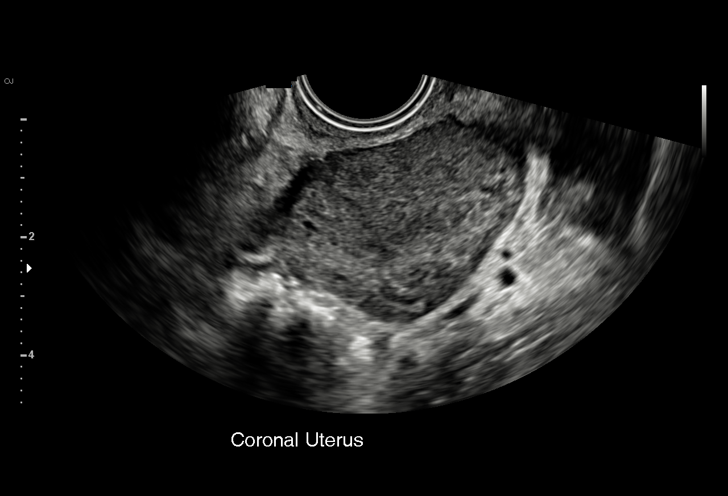
[im 51/82]
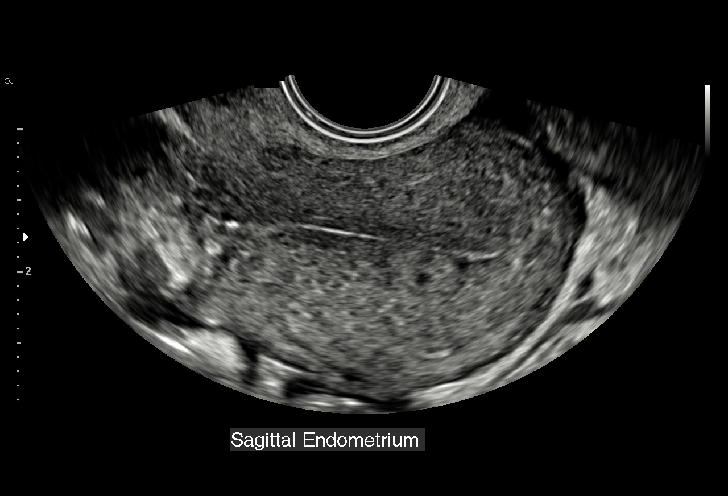
[im 58/82]
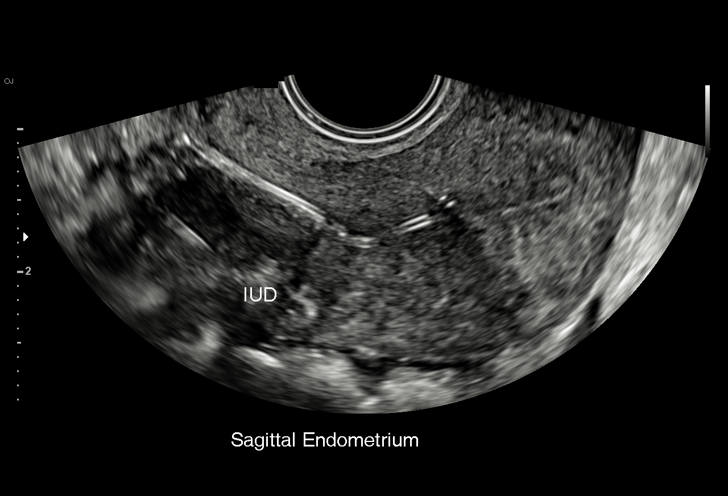
[im 65/82]
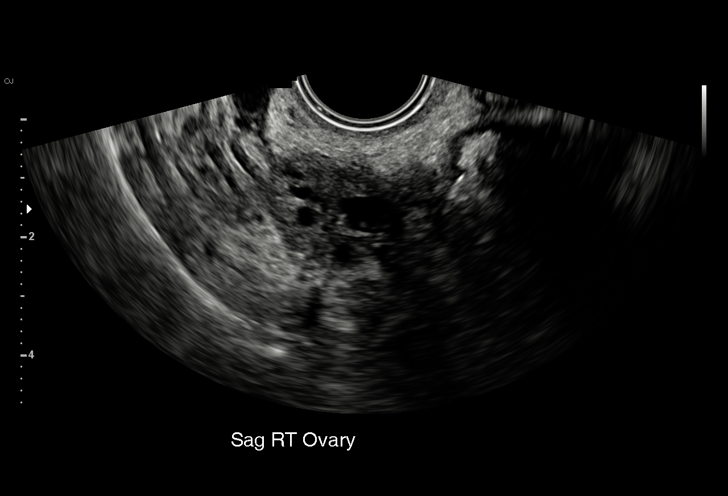
[im 68/82]
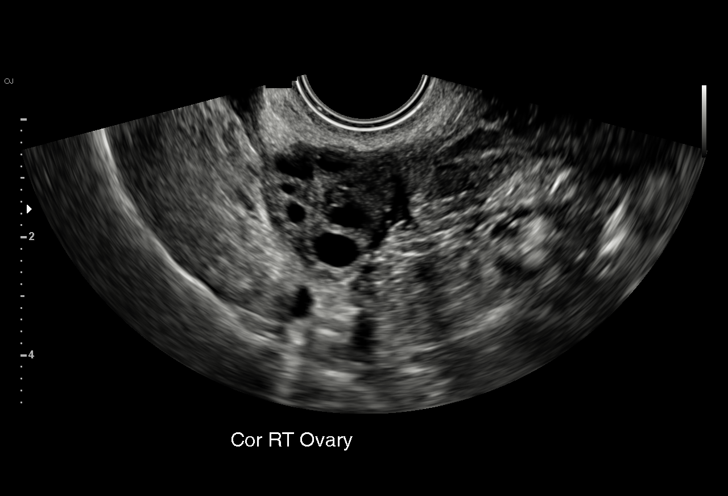
[im 75/82]
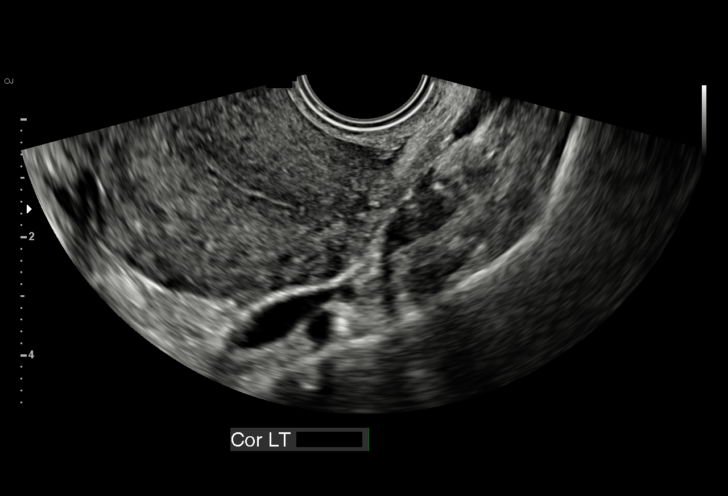
[im 82/82]
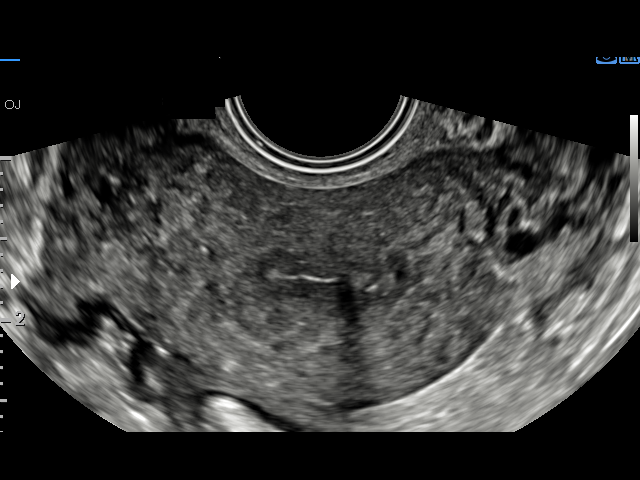

[15 of 25 positions shown; findings below may reference images not displayed]

FINDINGS: Uterus

Measurements: 8.3 x 3.8 x 5.1 cm. Retroverted. No fibroids or other
mass visualized.

Endometrium

Thickness: Thin endometrium measuring approximately 5 mm in
thickness. IUD visualized in the cervix with distal tip in the
endometrial cavity in the lower uterine segment.

Right ovary

Measurements: 3.3 x 3.1 x 2.2 cm. Normal appearance/no adnexal mass.

Left ovary

Measurements: Not visualized, however no adnexal mass identified.

Other findings

No abnormal free fluid.
IMPRESSION: No evidence of retained products of conception.

Abnormal IUD position with distal tip in endometrial cavity in lower
uterine segment.

No adnexal mass identified.

## 2018-10-23 IMAGING — DX DG LUMBAR SPINE COMPLETE 4+V
5 series · 5 of 5 positions shown · non-contrast
Comparison: None.

CLINICAL DATA: Acute onset of lower back pain and left hip pain.
Initial encounter.

EXAM:
LUMBAR SPINE - COMPLETE 4+ VIEW

[l-spine ap]
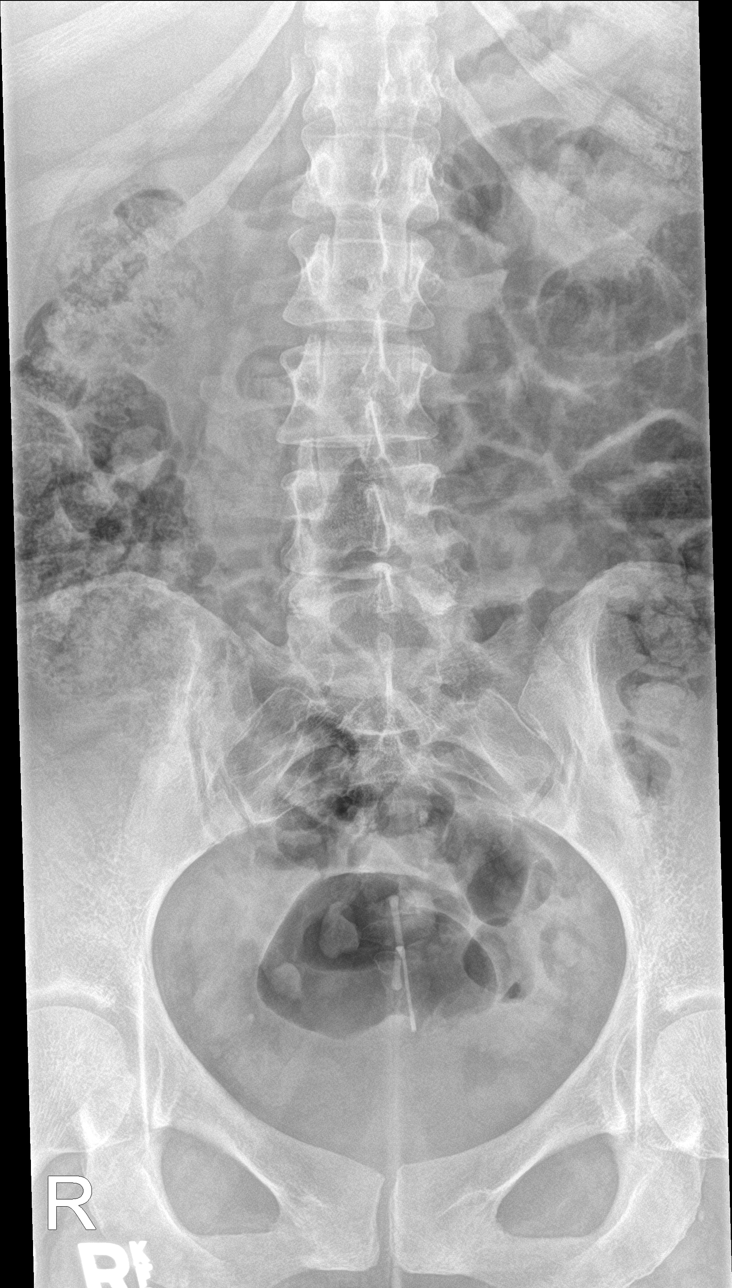

[l-spine obl (1 of 2)]
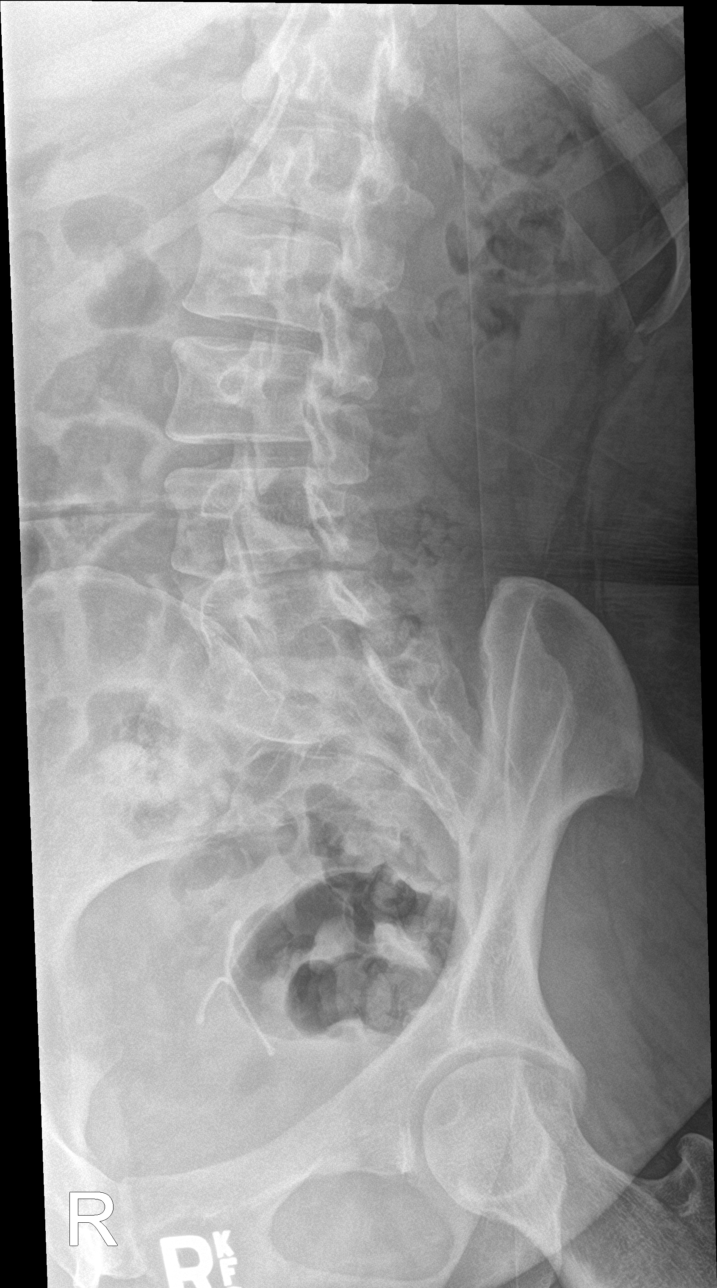

[l-spine obl (2 of 2)]
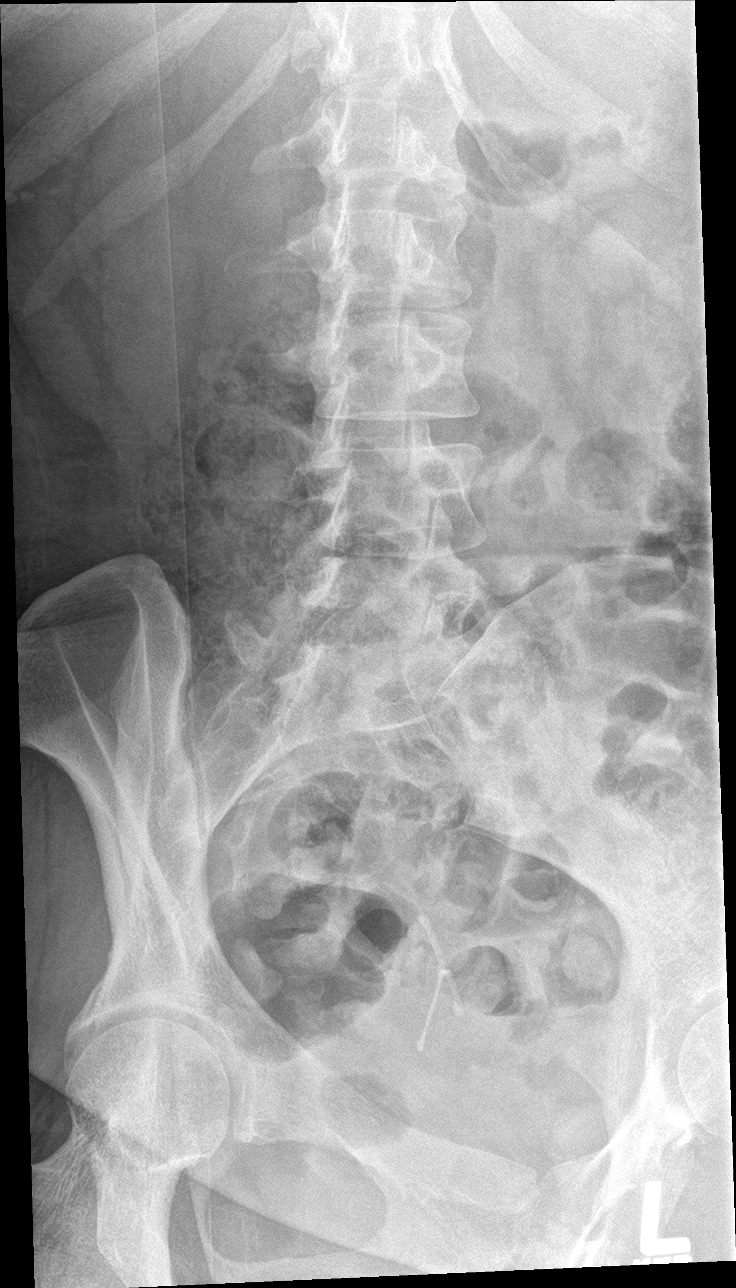

[l-spine lat]
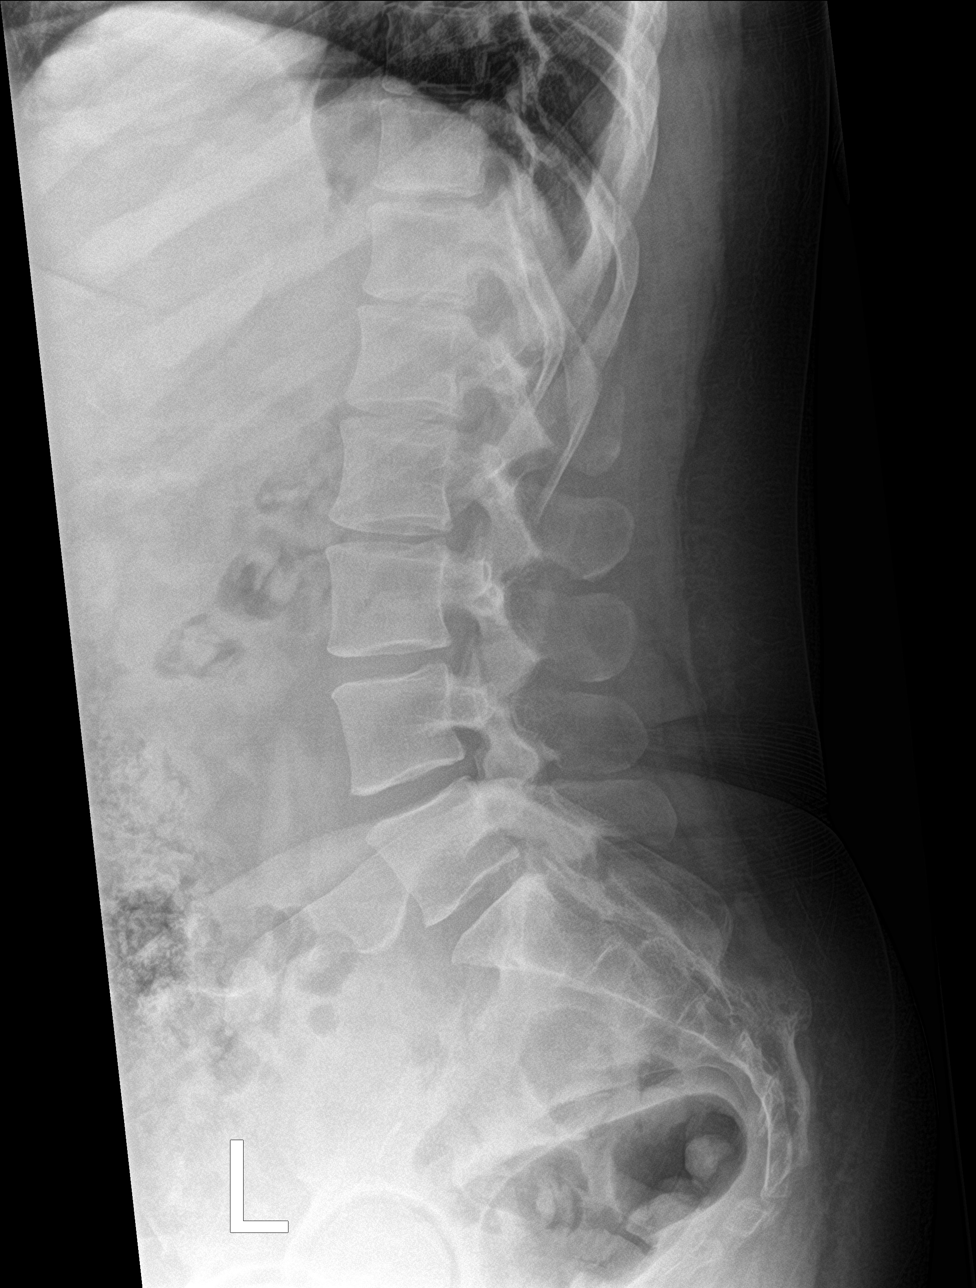

[l-spine spot]
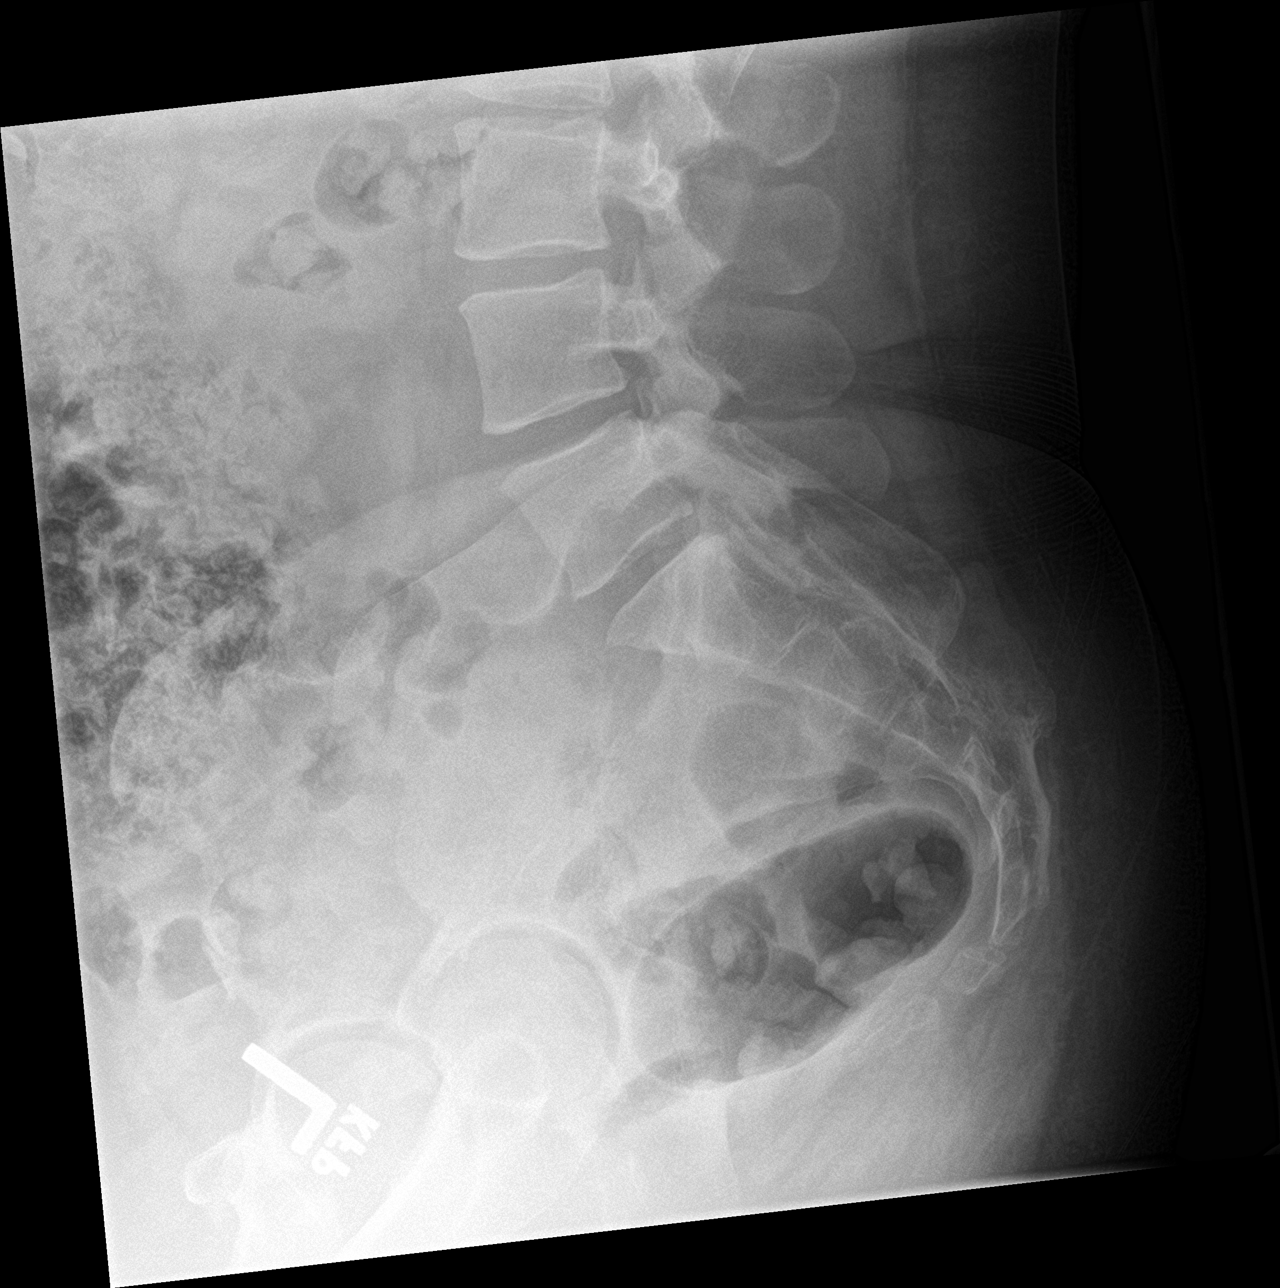

[5 of 5 positions shown; findings below may reference images not displayed]

FINDINGS: There is no evidence of fracture or subluxation. Vertebral bodies
demonstrate normal height and alignment. Intervertebral disc spaces
are preserved. The visualized neural foramina are grossly
unremarkable in appearance.

The visualized bowel gas pattern is unremarkable in appearance; air
and stool are noted within the colon. The sacroiliac joints are
within normal limits. An intrauterine device is noted at the mid
pelvis.
IMPRESSION: No evidence of fracture or subluxation along the lumbar spine.

## 2019-05-11 ENCOUNTER — Other Ambulatory Visit: Payer: Self-pay | Admitting: Primary Care

## 2019-05-11 DIAGNOSIS — O1003 Pre-existing essential hypertension complicating the puerperium: Secondary | ICD-10-CM

## 2019-05-18 ENCOUNTER — Other Ambulatory Visit: Payer: Self-pay

## 2019-05-27 ENCOUNTER — Encounter: Payer: Self-pay | Admitting: Primary Care

## 2019-06-18 ENCOUNTER — Other Ambulatory Visit: Payer: Self-pay | Admitting: Family Medicine

## 2019-06-18 ENCOUNTER — Other Ambulatory Visit: Payer: Self-pay

## 2019-06-18 ENCOUNTER — Encounter: Payer: Self-pay | Admitting: Family Medicine

## 2019-06-18 ENCOUNTER — Ambulatory Visit (INDEPENDENT_AMBULATORY_CARE_PROVIDER_SITE_OTHER): Payer: BC Managed Care – PPO | Admitting: Family Medicine

## 2019-06-18 VITALS — BP 112/88 | HR 100 | Temp 98.1°F | Ht 68.0 in | Wt 252.8 lb

## 2019-06-18 DIAGNOSIS — N61 Mastitis without abscess: Secondary | ICD-10-CM

## 2019-06-18 HISTORY — DX: Mastitis without abscess: N61.0

## 2019-06-18 MED ORDER — AMOXICILLIN-POT CLAVULANATE 875-125 MG PO TABS: 1.0000 | ORAL_TABLET | Freq: Two times a day (BID) | ORAL | 0 refills | Status: AC

## 2019-06-18 NOTE — Patient Instructions (Addendum)
#  Breast infection - start antibiotic - get the ultrasound - if there is an abscess we would want to refer you to general surgery

## 2019-06-18 NOTE — Progress Notes (Signed)
   Subjective:     Julia Kline is a 33 y.o. female presenting for Breast Problem (right breast swollen and there is a lump present. Noticed it around 06/15/2019)     HPI   #Breast issue - redness present - stopped breastfeeding in March 2020 - painful - and noticed a lump present  - symptoms x 3 days - brown area of nipple was swollen - and no nipple discharge - fullness but not like a need to pump - no know trauma -    Review of Systems  Constitutional: Negative for chills and fever.  Gastrointestinal: Negative for nausea and vomiting.     Social History   Tobacco Use  Smoking Status Never Smoker  Smokeless Tobacco Never Used        Objective:    BP Readings from Last 3 Encounters:  06/18/19 112/88  04/24/18 124/84  04/01/18 (!) 136/96   Wt Readings from Last 3 Encounters:  06/18/19 252 lb 12 oz (114.6 kg)  04/24/18 249 lb 8 oz (113.2 kg)  04/01/18 244 lb 12 oz (111 kg)    BP 112/88   Pulse 100   Temp 98.1 F (36.7 C)   Ht 5\' 8"  (1.727 m)   Wt 252 lb 12 oz (114.6 kg)   SpO2 98%   BMI 38.43 kg/m    Physical Exam Constitutional:      General: She is not in acute distress.    Appearance: She is well-developed. She is not diaphoretic.  HENT:     Right Ear: External ear normal.     Left Ear: External ear normal.     Nose: Nose normal.  Eyes:     Conjunctiva/sclera: Conjunctivae normal.  Neck:     Musculoskeletal: Neck supple.  Cardiovascular:     Rate and Rhythm: Normal rate.  Pulmonary:     Effort: Pulmonary effort is normal.  Chest:     Breasts:        Right: No inverted nipple or nipple discharge.      Comments: Right breast with diffuse erythema and warmth to the touch. Some fullness and firmness present on the 3 o'clock position and under the erythema though large area and not well circumscribed.  Skin:    General: Skin is warm and dry.     Capillary Refill: Capillary refill takes less than 2 seconds.  Neurological:   Mental Status: She is alert. Mental status is at baseline.  Psychiatric:        Mood and Affect: Mood normal.        Behavior: Behavior normal.           Assessment & Plan:   Problem List Items Addressed This Visit      Other   Mastitis - Primary    Non-lactating mastitis is concerning based on exam. Will get [place] to r/o abscess and start Abx therapy. If abscess present will need urgent referral to general surgery.       Relevant Medications   amoxicillin-clavulanate (AUGMENTIN) 875-125 MG tablet   Other Relevant Orders   US BREAST LTD UNI RIGHT INC AXILLA   MM DIAG BREAST TOMO BILATERAL       Return if symptoms worsen or fail to improve.  Lesleigh Noe, MD

## 2019-06-18 NOTE — Assessment & Plan Note (Signed)
Non-lactating mastitis is concerning based on exam. Will get [place] to r/o abscess and start Abx therapy. If abscess present will need urgent referral to general surgery.

## 2019-06-19 ENCOUNTER — Ambulatory Visit
Admission: RE | Admit: 2019-06-19 | Discharge: 2019-06-19 | Disposition: A | Discharge status: Home / Self Care | Payer: BC Managed Care – PPO | Source: Ambulatory Visit | Attending: Family Medicine | Admitting: Family Medicine

## 2019-06-19 ENCOUNTER — Other Ambulatory Visit: Payer: Self-pay | Admitting: Family Medicine

## 2019-06-19 DIAGNOSIS — N61 Mastitis without abscess: Secondary | ICD-10-CM

## 2019-06-19 DIAGNOSIS — N6310 Unspecified lump in the right breast, unspecified quadrant: Secondary | ICD-10-CM

## 2019-06-19 DIAGNOSIS — N611 Abscess of the breast and nipple: Secondary | ICD-10-CM

## 2019-06-22 ENCOUNTER — Encounter: Payer: Self-pay | Admitting: Family Medicine

## 2019-06-24 LAB — AEROBIC/ANAEROBIC CULTURE W GRAM STAIN (SURGICAL/DEEP WOUND): Culture: NO GROWTH

## 2019-06-26 ENCOUNTER — Other Ambulatory Visit: Payer: Self-pay | Admitting: Family Medicine

## 2019-06-26 ENCOUNTER — Other Ambulatory Visit: Payer: Self-pay

## 2019-06-26 ENCOUNTER — Ambulatory Visit
Admission: RE | Admit: 2019-06-26 | Discharge: 2019-06-26 | Disposition: A | Discharge status: Home / Self Care | Payer: BC Managed Care – PPO | Source: Ambulatory Visit | Attending: Family Medicine | Admitting: Family Medicine

## 2019-06-26 DIAGNOSIS — N611 Abscess of the breast and nipple: Secondary | ICD-10-CM

## 2019-06-26 DIAGNOSIS — N61 Mastitis without abscess: Secondary | ICD-10-CM

## 2019-06-29 ENCOUNTER — Encounter: Payer: Self-pay | Admitting: Family Medicine

## 2019-07-10 ENCOUNTER — Ambulatory Visit (INDEPENDENT_AMBULATORY_CARE_PROVIDER_SITE_OTHER): Payer: BC Managed Care – PPO | Admitting: Family Medicine

## 2019-07-10 ENCOUNTER — Encounter: Payer: Self-pay | Admitting: Family Medicine

## 2019-07-10 VITALS — Temp 96.8°F | Ht 68.0 in

## 2019-07-10 DIAGNOSIS — H1031 Unspecified acute conjunctivitis, right eye: Secondary | ICD-10-CM | POA: Insufficient documentation

## 2019-07-10 HISTORY — DX: Unspecified acute conjunctivitis, right eye: H10.31

## 2019-07-10 MED ORDER — NEOMYCIN-POLYMYXIN-DEXAMETH 3.5-10000-0.1 OP SUSP: 1.0000 [drp] | Freq: Four times a day (QID) | OPHTHALMIC | 0 refills | Status: AC

## 2019-07-10 NOTE — Progress Notes (Signed)
Work note faxed to [phone] as instructed by Dr. Diona Browner.

## 2019-07-10 NOTE — Progress Notes (Signed)
VIRTUAL VISIT Due to national recommendations of social distancing due to [place] 19, a virtual visit is felt to be most appropriate for this patient at this time.   I connected with the patient on 07/10/19 at  9:20 AM EST by virtual telehealth platform and verified that I am speaking with the correct person using two identifiers.   I discussed the limitations, risks, security and privacy concerns of performing an evaluation and management service by  virtual telehealth platform and the availability of in person appointments. I also discussed with the patient that there may be a patient responsible charge related to this service. The patient expressed understanding and agreed to proceed.  Patient location: Home Provider Location: [facility] Participants: Eliezer Lofts and [place]   Chief Complaint  Patient presents with  . Conjunctivitis    History of Present Illness: 33 year old female with right eye redness and drainage in last 24 hours. Feels itchy and irritate. Wears contact but not recent.  Matted together in morning. No swelling around eye. Full eye  Movement. No visual change.  No congestion, no SOB. No fever.  Works in childcare as a Pharmacist, hospital.  No sick contacts.  COVID 19 screen No recent travel or known exposure to COVID19 The patient denies respiratory symptoms of COVID 19 at this time.  The importance of social distancing was discussed today.   Review of Systems  Constitutional: Negative for chills and fever.  HENT: Negative for congestion and ear pain.   Eyes: Negative for pain and redness.  Respiratory: Negative for cough and shortness of breath.   Cardiovascular: Negative for chest pain, palpitations and leg swelling.  Gastrointestinal: Negative for abdominal pain, blood in stool, constipation, diarrhea, nausea and vomiting.  Genitourinary: Negative for dysuria.  Musculoskeletal: Negative for falls and myalgias.  Skin: Negative for rash.   Neurological: Negative for dizziness.  Psychiatric/Behavioral: Negative for depression. The patient is not nervous/anxious.       Past Medical History:  Diagnosis Date  . Eczema   . Hernia cerebri ([place])   . History of gestational diabetes 05/02/2017   Normal pp testing 10/2017  . History of severe pre-eclampsia 05/02/2017  . Hypertension   . Migraine   . PCOS (polycystic ovarian syndrome)   . Pregnancy induced hypertension     reports that she has never smoked. She has never used smokeless tobacco. She reports that she does not drink alcohol or use drugs.  No current outpatient medications on file.   Observations/Objective: Temperature (!) 96.8 F (36 C), temperature source Temporal, height 5\' 8"  (1.727 m), currently breastfeeding.  Physical Exam  Physical Exam Constitutional:      General: The patient is not in acute distress. RIGHT EYE: full ROM, conjunctiva red, no discharge, no surrounding erythema Pulmonary:     Effort: Pulmonary effort is normal. No respiratory distress.  Neurological:     Mental Status: The patient is alert and oriented to person, place, and time.  Psychiatric:        Mood and Affect: Mood normal.        Behavior: Behavior normal.   Assessment and Plan Likely viral conjecntivitis, no complications... treat with warm compressess, saline drops... If not improving or if needed to return to school.. can start antibiotic/dexamethasone drops. Remain home until resolved given contagiousness.   I discussed the assessment and treatment plan with the patient. The patient was provided an opportunity to ask questions and all were answered. The patient agreed with  the plan and demonstrated an understanding of the instructions.   The patient was advised to call back or seek an in-person evaluation if the symptoms worsen or if the condition fails to improve as anticipated.     Kerby Nora, MD

## 2019-07-20 ENCOUNTER — Other Ambulatory Visit: Payer: BC Managed Care – PPO

## 2019-07-20 ENCOUNTER — Other Ambulatory Visit: Payer: Self-pay

## 2019-07-20 ENCOUNTER — Ambulatory Visit
Admission: RE | Admit: 2019-07-20 | Discharge: 2019-07-20 | Disposition: A | Discharge status: Home / Self Care | Payer: BC Managed Care – PPO | Source: Ambulatory Visit | Attending: Family Medicine | Admitting: Family Medicine

## 2019-07-20 ENCOUNTER — Other Ambulatory Visit: Payer: Self-pay | Admitting: Family Medicine

## 2019-07-20 ENCOUNTER — Ambulatory Visit: Payer: BC Managed Care – PPO

## 2019-07-20 DIAGNOSIS — N61 Mastitis without abscess: Secondary | ICD-10-CM

## 2019-07-20 DIAGNOSIS — N611 Abscess of the breast and nipple: Secondary | ICD-10-CM

## 2019-07-21 ENCOUNTER — Encounter: Payer: Self-pay | Admitting: Family Medicine

## 2019-08-03 ENCOUNTER — Telehealth: Payer: Self-pay

## 2019-08-03 NOTE — Telephone Encounter (Signed)
Patient left a message stating she is having some clotting that started yesterday. She is not soaking 1-2 per hour however she is cramping a lot. She has the IUD in place and has been without a period for some time now. She does report able to feel her strings. I have advised patient to schedule appointment.

## 2019-08-03 NOTE — Telephone Encounter (Signed)
-----   Message from Allena Earing, NT sent at 08/03/2019  1:30 PM EST ----- Please call patient

## 2019-08-06 ENCOUNTER — Ambulatory Visit (INDEPENDENT_AMBULATORY_CARE_PROVIDER_SITE_OTHER): Payer: BC Managed Care – PPO | Admitting: Family Medicine

## 2019-08-06 ENCOUNTER — Other Ambulatory Visit: Payer: Self-pay

## 2019-08-06 ENCOUNTER — Encounter: Payer: Self-pay | Admitting: Family Medicine

## 2019-08-06 VITALS — BP 137/91 | HR 86 | Temp 98.3°F | Resp 20 | Ht 68.0 in

## 2019-08-06 DIAGNOSIS — M25561 Pain in right knee: Secondary | ICD-10-CM | POA: Insufficient documentation

## 2019-08-06 HISTORY — DX: Pain in right knee: M25.561

## 2019-08-06 MED ORDER — DICLOFENAC SODIUM 75 MG PO TBEC: 75.0000 mg | DELAYED_RELEASE_TABLET | Freq: Two times a day (BID) | ORAL | 0 refills | Status: DC

## 2019-08-06 NOTE — Patient Instructions (Signed)
#  Knee Pain - Take Diclofenac twice daily - if still having pain - take tylenol 1000 mg  - Ice for 10 minutes 3 times a day or as often as able - Rest - get crutches or borrow to avoid weight bearing - Wear the brace  If not improving (able to bear weight, decreasing pain, improved range of motion) -- make a follow-up appointment with either Dr. Einar Pheasant, Allie Bossier, or Dr. Edilia Bo (Sports medicine)

## 2019-08-06 NOTE — Assessment & Plan Note (Signed)
Acute pain w/o injury. Exam limited 2/2 to pain. Advised RICE and NSAIDs. Return Monday if weight bearing tolerance is not improved and would likely get XR at that time.

## 2019-08-06 NOTE — Progress Notes (Signed)
Subjective:     Julia Kline is a 33 y.o. female presenting for Knee Pain (right knee. x 1 week. Got worse suddenly)     Knee Pain  The incident occurred more than 1 week ago. There was no injury mechanism. The pain is present in the right knee. The quality of the pain is described as aching. The pain is severe. The pain has been constant since onset. Associated symptoms include an inability to bear weight. Pertinent negatives include no loss of motion, loss of sensation, muscle weakness, numbness or tingling. She reports no foreign bodies present. The symptoms are aggravated by movement and weight bearing. She has tried immobilization, NSAIDs, rest and heat (switched shoes, brace) for the symptoms.   Icy/hot - worse Has not tried: ice Soaking in bath Medications: excedrin migraine  Felt like jelly last week  Hx of knee injury: high school - dislocating one knee, college - therapy for knee - no hx of knee surgery, did MRI and wore brace (left knee)   Review of Systems  Neurological: Negative for tingling and numbness.   Outside imaging 2008 Right Knee MRI: patellar dislocation with cortical defect in the femoral condyle  Social History   Tobacco Use  Smoking Status Never Smoker  Smokeless Tobacco Never Used        Objective:    BP Readings from Last 3 Encounters:  08/06/19 (!) 137/91  06/18/19 112/88  04/24/18 124/84   Wt Readings from Last 3 Encounters:  06/18/19 252 lb 12 oz (114.6 kg)  04/24/18 249 lb 8 oz (113.2 kg)  04/01/18 244 lb 12 oz (111 kg)    BP (!) 137/91   Pulse 86   Temp 98.3 F (36.8 C)   Resp 20   Ht 5\' 8"  (1.727 m)   SpO2 97%   Breastfeeding No   BMI 38.43 kg/m    Physical Exam Constitutional:      General: She is not in acute distress.    Appearance: She is well-developed. She is not diaphoretic.  HENT:     Right Ear: External ear normal.     Left Ear: External ear normal.     Nose:     Comments: Wearing mask Eyes:     Conjunctiva/sclera: Conjunctivae normal.  Cardiovascular:     Rate and Rhythm: Normal rate.  Pulmonary:     Effort: Pulmonary effort is normal.  Musculoskeletal:     Cervical back: Neck supple.     Comments: Right Knee exam:  Inspection: no effusion, no bruising  Palpation: No joint line, patella, bone, or posterior TTP ROM: significantly limited due to pain - pt resists and extremely uncomfortable with extension limit at 10 degrees and flexion to about 80 degrees. Normal ROM on the left side.  Due to acute pain - unable to get patient to relax for ligament testing. Seems uncomfortable with attempts for meniscus testing but may just be due to moving the knee. Limited patella mobility w/o signs of apprehension and normal appearing patella glide.     Skin:    General: Skin is warm and dry.     Capillary Refill: Capillary refill takes less than 2 seconds.  Neurological:     Mental Status: She is alert. Mental status is at baseline.  Psychiatric:        Mood and Affect: Mood normal.        Behavior: Behavior normal.           Assessment & Plan:  Problem List Items Addressed This Visit      Other   Acute pain of right knee - Primary    Acute pain w/o injury. Exam limited 2/2 to pain. Advised RICE and NSAIDs. Return Monday if weight bearing tolerance is not improved and would likely get XR at that time.       Relevant Medications   diclofenac (VOLTAREN) 75 MG EC tablet       Return in about 4 days (around 08/10/2019).  Lynnda Child, MD

## 2019-08-11 ENCOUNTER — Ambulatory Visit (INDEPENDENT_AMBULATORY_CARE_PROVIDER_SITE_OTHER): Payer: BC Managed Care – PPO | Admitting: Family Medicine

## 2019-08-11 ENCOUNTER — Ambulatory Visit: Payer: BC Managed Care – PPO | Admitting: Family Medicine

## 2019-08-11 ENCOUNTER — Telehealth: Payer: Self-pay

## 2019-08-11 ENCOUNTER — Encounter: Payer: Self-pay | Admitting: Family Medicine

## 2019-08-11 VITALS — Temp 97.5°F

## 2019-08-11 DIAGNOSIS — M25561 Pain in right knee: Secondary | ICD-10-CM

## 2019-08-11 NOTE — Telephone Encounter (Signed)
[place] Night - Client Nonclinical Telephone Record AccessNurse Client [facility] Night - Client Client Site [place] Physician Waunita Schooner- MD Contact Type Call Who Is Calling Patient / Member / Family / Caregiver Caller Name Harlan Phone Number [phone] Patient Name Julia Kline Patient DOB [birth year] Call Type Message Only Information Provided Reason for Call Request to [facility] Appointment Initial Comment Caller states she tried to CANCEL her appointment 08/11/2019 @ 10:00am on MyChart, but it said to contact the office. The caller's knee is much better and caller does not need to see the office. Additional Comment Caller states she needs to CANCEL her appointment on 08/11/2019 @ 10:00am. Caller's knee is very good now. Disp. Time Disposition Final User 08/10/2019 6:07:34 PM General Information Provided Yes King-Hussey, Berdi Call Closed By: Bonnita Nasuti Transaction Date/Time: 08/10/2019 6:03:11 PM (ET)

## 2019-08-11 NOTE — Progress Notes (Signed)
    I connected with Julia Kline on 08/11/19 at 10:00 AM EST by video and verified that I am speaking with the correct person using two identifiers.   I discussed the limitations, risks, security and privacy concerns of performing an evaluation and management service by video and the availability of in person appointments. I also discussed with the patient that there may be a patient responsible charge related to this service. The patient expressed understanding and agreed to proceed.  Patient location: Home Provider Location: [facility] Participants: Lesleigh Noe and Miller Relph   Subjective:     Julia Kline is a 33 y.o. female presenting for Knee Pain (right. follow up. Better. Stopped hurting on 08/08/2019 except when she gets on her knees)     HPI   #Right Knee pain - stopped hurting on 12/19 - going well - started feeling better on Saturday - has been able to walk - yesterday was down on her knees - and had some pain with bearing weight on her knees - is able to bend and straighten knee all the way without pain - medication: still taking diclofenac scheduled - pain lasting after kneeling - was a little swollen, but improved with standing   Review of Systems See HPI  Social History   Tobacco Use  Smoking Status Never Smoker  Smokeless Tobacco Never Used        Objective:   BP Readings from Last 3 Encounters:  08/06/19 (!) 137/91  06/18/19 112/88  04/24/18 124/84   Wt Readings from Last 3 Encounters:  06/18/19 252 lb 12 oz (114.6 kg)  04/24/18 249 lb 8 oz (113.2 kg)  04/01/18 244 lb 12 oz (111 kg)     Temp (!) 97.5 F (36.4 C) Comment: per patient   Physical Exam Constitutional:      Appearance: Normal appearance. She is not ill-appearing.  HENT:     Head: Normocephalic and atraumatic.     Right Ear: External ear normal.     Left Ear: External ear normal.  Eyes:     Conjunctiva/sclera: Conjunctivae normal.    Pulmonary:     Effort: Pulmonary effort is normal. No respiratory distress.  Neurological:     Mental Status: She is alert. Mental status is at baseline.  Psychiatric:        Mood and Affect: Mood normal.        Behavior: Behavior normal.        Thought Content: Thought content normal.        Judgment: Judgment normal.           Assessment & Plan:   Problem List Items Addressed This Visit      Other   Acute pain of right knee - Primary     Symptoms mostly resolved OK to discontinue scheduled diclofenac, can use as needed Reach out if wanting to do physical therapy  Return if symptoms worsen or fail to improve.  Lesleigh Noe, MD

## 2019-08-11 NOTE — Telephone Encounter (Signed)
I cancelled this appointment. I left a voicemail letting patient know it has been cancelled.

## 2019-08-12 ENCOUNTER — Ambulatory Visit (INDEPENDENT_AMBULATORY_CARE_PROVIDER_SITE_OTHER): Payer: BC Managed Care – PPO | Admitting: Certified Nurse Midwife

## 2019-08-12 ENCOUNTER — Other Ambulatory Visit: Payer: Self-pay

## 2019-08-12 ENCOUNTER — Encounter: Payer: Self-pay | Admitting: Certified Nurse Midwife

## 2019-08-12 VITALS — BP 153/101 | HR 93 | Wt 263.0 lb

## 2019-08-12 DIAGNOSIS — Z3042 Encounter for surveillance of injectable contraceptive: Secondary | ICD-10-CM

## 2019-08-12 DIAGNOSIS — Z30432 Encounter for removal of intrauterine contraceptive device: Secondary | ICD-10-CM | POA: Diagnosis not present

## 2019-08-12 DIAGNOSIS — I10 Essential (primary) hypertension: Secondary | ICD-10-CM

## 2019-08-12 MED ORDER — MEDROXYPROGESTERONE ACETATE 150 MG/ML IM SUSP
150.0000 mg | Freq: Once | INTRAMUSCULAR | Status: AC
  Administered 2019-08-12: 150 mg via INTRAMUSCULAR

## 2019-08-12 MED ORDER — AMLODIPINE BESYLATE 5 MG PO TABS: 5.0000 mg | ORAL_TABLET | Freq: Every day | ORAL | 2 refills | Status: DC

## 2019-08-12 MED ORDER — MEDROXYPROGESTERONE ACETATE 150 MG/ML IM SUSP: 150.0000 mg | INTRAMUSCULAR | 2 refills | Status: DC

## 2019-08-12 NOTE — Patient Instructions (Signed)
Medroxyprogesterone injection [Contraceptive] What is this medicine? MEDROXYPROGESTERONE (me DROX ee proe JES te rone) contraceptive injections prevent pregnancy. They provide effective birth control for 3 months. Depo-subQ Provera 104 is also used for treating pain related to endometriosis. This medicine may be used for other purposes; ask your health care provider or pharmacist if you have questions. COMMON BRAND NAME(S): Depo-Provera, Depo-subQ Provera 104 What should I tell my health care provider before I take this medicine? They need to know if you have any of these conditions:  frequently drink alcohol  asthma  blood vessel disease or a history of a blood clot in the lungs or legs  bone disease such as osteoporosis  breast cancer  diabetes  eating disorder (anorexia nervosa or bulimia)  high blood pressure  HIV infection or AIDS  kidney disease  liver disease  mental depression  migraine  seizures (convulsions)  stroke  tobacco smoker  vaginal bleeding  an unusual or allergic reaction to medroxyprogesterone, other hormones, medicines, foods, dyes, or preservatives  pregnant or trying to get pregnant  breast-feeding How should I use this medicine? Depo-Provera Contraceptive injection is given into a muscle. Depo-subQ Provera 104 injection is given under the skin. These injections are given by a health care professional. You must not be pregnant before getting an injection. The injection is usually given during the first 5 days after the start of a menstrual period or 6 weeks after delivery of a baby. Talk to your pediatrician regarding the use of this medicine in children. Special care may be needed. These injections have been used in female children who have started having menstrual periods. Overdosage: If you think you have taken too much of this medicine contact a poison control center or emergency room at once. NOTE: This medicine is only for you. Do not  share this medicine with others. What if I miss a dose? Try not to miss a dose. You must get an injection once every 3 months to maintain birth control. If you cannot keep an appointment, call and reschedule it. If you wait longer than 13 weeks between Depo-Provera contraceptive injections or longer than 14 weeks between Depo-subQ Provera 104 injections, you could get pregnant. Use another method for birth control if you miss your appointment. You may also need a pregnancy test before receiving another injection. What may interact with this medicine? Do not take this medicine with any of the following medications:  bosentan This medicine may also interact with the following medications:  aminoglutethimide  antibiotics or medicines for infections, especially rifampin, rifabutin, rifapentine, and griseofulvin  aprepitant  barbiturate medicines such as phenobarbital or primidone  bexarotene  carbamazepine  medicines for seizures like ethotoin, felbamate, oxcarbazepine, phenytoin, topiramate  modafinil  St. John's wort This list may not describe all possible interactions. Give your health care provider a list of all the medicines, herbs, non-prescription drugs, or dietary supplements you use. Also tell them if you smoke, drink alcohol, or use illegal drugs. Some items may interact with your medicine. What should I watch for while using this medicine? This drug does not protect you against HIV infection (AIDS) or other sexually transmitted diseases. Use of this product may cause you to lose calcium from your bones. Loss of calcium may cause weak bones (osteoporosis). Only use this product for more than 2 years if other forms of birth control are not right for you. The longer you use this product for birth control the more likely you will be at risk   for weak bones. Ask your health care professional how you can keep strong bones. You may have a change in bleeding pattern or irregular periods.  Many females stop having periods while taking this drug. If you have received your injections on time, your chance of being pregnant is very low. If you think you may be pregnant, see your health care professional as soon as possible. Tell your health care professional if you want to get pregnant within the next year. The effect of this medicine may last a long time after you get your last injection. What side effects may I notice from receiving this medicine? Side effects that you should report to your doctor or health care professional as soon as possible:  allergic reactions like skin rash, itching or hives, swelling of the face, lips, or tongue  breast tenderness or discharge  breathing problems  changes in vision  depression  feeling faint or lightheaded, falls  fever  pain in the abdomen, chest, groin, or leg  problems with balance, talking, walking  unusually weak or tired  yellowing of the eyes or skin Side effects that usually do not require medical attention (report to your doctor or health care professional if they continue or are bothersome):  acne  fluid retention and swelling  headache  irregular periods, spotting, or absent periods  temporary pain, itching, or skin reaction at site where injected  weight gain This list may not describe all possible side effects. Call your doctor for medical advice about side effects. You may report side effects to FDA at 1-800-FDA-1088. Where should I keep my medicine? This does not apply. The injection will be given to you by a health care professional. NOTE: This sheet is a summary. It may not cover all possible information. If you have questions about this medicine, talk to your doctor, pharmacist, or health care provider.  2020 Elsevier/Gold Standard (2008-08-27 18:37:56)  

## 2019-08-12 NOTE — Progress Notes (Signed)
History:  Ms. Julia Kline is a 33 y.o. [id number] who presents to clinic today for IUD removal and birth control counseling.   She reports having IUD placed in early 2019 - had irregular bleeding initially then it resolved. Since placement reports husband has been able to feel strings then this month had a heavier period than usual. Reports going through entire bag of super pads over the course of 5-6 days, passing large clots and worsening cramps. She reports wanting the IUD removed and another type of contraception as she does not have any plans to conceive for another 2-3 years.    Denies vaginal bleeding, vaginal discharge, painful intercourse of any other GYN concerns at this time.   The following portions of the patient's history were reviewed and updated as appropriate: allergies, current medications, family history, past medical history, social history, past surgical history and problem list.  Review of Systems:  Review of Systems  Constitutional: Negative.   Respiratory: Negative.   Cardiovascular: Negative.   Gastrointestinal: Negative.   Genitourinary: Negative.   Neurological: Negative.   Psychiatric/Behavioral: Negative.      Objective:  Physical Exam BP (!) 153/101   Pulse 93   Wt 263 lb (119.3 kg)   LMP 08/03/2019   Breastfeeding No   BMI 39.99 kg/m  Physical Exam Exam conducted with a chaperone present.  HENT:     Head: Normocephalic.  Cardiovascular:     Rate and Rhythm: Normal rate and regular rhythm.  Pulmonary:     Effort: Pulmonary effort is normal. No respiratory distress.     Breath sounds: Normal breath sounds. No wheezing.  Abdominal:     General: There is no distension.     Palpations: Abdomen is soft.     Tenderness: There is no abdominal tenderness.  Genitourinary:    Vagina: No vaginal discharge or bleeding.     Cervix: No friability.     Comments: IUD strings not seen- see below for procedure note  Skin:    General: Skin is warm  and dry.  Neurological:     Mental Status: She is alert and oriented to person, place, and time.  Psychiatric:        Mood and Affect: Mood normal.        Behavior: Behavior normal.        Thought Content: Thought content normal.    PROCEDURE NOTE  Julia Kline IUD removal. No GYN concerns.  Last pap smear was on 02/2017 and was normal.  IUD Removal  Patient was in the dorsal lithotomy position, normal external genitalia was noted.  A speculum was placed in the patient's vagina, normal discharge was noted, no lesions. The multiparous cervix was visualized, no lesions, no abnormal discharge. The strings of the IUD were not visualized, so Kelly forceps were introduced into the endometrial cavity and the IUD was grasped and removed in its entirety.  Patient tolerated the procedure well.    Patient will use Depo for contraception. First depo injection given today in office. Rx for continuation sent to pharmacy of choice. Patient will evaluate if she likes the depo and    Routine preventative health maintenance measures emphasized.  Assessment & Plan:  1. Encounter for IUD removal - Julia Kline removed  - Educated and discussed birth control options in detail prior to removal  - Patient BP elevated in office today, educated on contraception choices that would be contraindicated d/t hypertension  - Patient decided on Depo for method of contraception  2. Encounter for Depo-Provera contraception - First dose given today in office after removal of IUD  - medroxyPROGESTERone (DEPO-PROVERA) 150 MG/ML injection; Inject 1 mL (150 mg total) into the muscle every 3 (three) months.  Dispense: 1 mL; Refill: 2 - medroxyPROGESTERone (DEPO-PROVERA) injection 150 mg  3. Chronic hypertension - Patient reports being on medication in past during pregnancy for HTN that was continued after pregnancy  - BP 153/101 then 159/111 today in the office  - Reports she stopped taking the medication in April/May of this year  without recommendation from PCP  - Educated and discussed importance of monitoring and treatment of HTN  - Discussed starting patient back on first line treatment for african americans and having patient follow up with PCP for management - patient agrees to plan of care  - Discussed picking up medication today and starting  - Encouraged to call PCP after the holidays and notify of restarting medication then schedule appointment for follow up - patient verbalizes understanding  - amLODipine (NORVASC) 5 MG tablet; Take 1 tablet (5 mg total) by mouth daily.  Dispense: 30 tablet; Refill: 2   Sharyon Cable, CNM 08/12/2019 5:00 PM

## 2019-08-12 NOTE — Progress Notes (Signed)
BP retake 159/111

## 2019-08-17 ENCOUNTER — Telehealth: Payer: Self-pay

## 2019-08-17 ENCOUNTER — Encounter: Payer: Self-pay | Admitting: Family Medicine

## 2019-08-17 NOTE — Telephone Encounter (Signed)
[facility] Night - Client TELEPHONE ADVICE RECORD [facility] Patient Name: Julia Kline Gender: Female DOB: [birth year] Age: 33 Y 9 M 29 D Return Phone Number: [phone] (Primary) Address: City/State/Zip: [place] [address] Client [facility] Night - Client Client Site [facility] Physician Waunita Schooner- MD Contact Type Call Who Is Calling Patient / Member / Family / Caregiver Call Type Triage / Clinical Relationship To Patient Self Return Phone Number [phone] (Primary) Chief Complaint Headache Reason for Call Symptomatic / Request for [place] states she was prescribed Amlodipine 5 mg for BP yesterday. States she feels really bad now and continues to have a headache. Feels like blood is rushing to her head. Had IUD removed yesterday. Translation No Nurse Assessment Nurse: Tawanna Solo, RN, Vaughan Basta Date/Time (Eastern Time): 08/13/2019 6:47:00 PM Confirm and document reason for call. If symptomatic, describe symptoms. ---Caller says she had headache yesterday. Her bp was high and her doctor started her on amlodipine 5mg . Caller says she feels really bad, feels like blood is rushing to her head and headache is still bad. Has the patient had close contact with a person known or suspected to have the novel coronavirus illness OR traveled / lives in area with major community spread (including international travel) in the last 14 days from the onset of symptoms? * If Asymptomatic, screen for exposure and travel within the last 14 days. ---No Does the patient have any new or worsening symptoms? ---Yes Will a triage be completed? ---Yes Related visit to physician within the last 2 weeks? ---Yes Does the PT have any chronic conditions? (i.e. diabetes, asthma, this includes High risk factors for pregnancy, etc.) ---Yes List chronic conditions. ---HTN Is the  patient pregnant or possibly pregnant? (Ask all females between the ages of 7-55) ---No Is this a behavioral health or substance abuse call? ---No PLEASE NOTE: All timestamps contained within this report are represented as [place] Standard Time. CONFIDENTIALTY NOTICE: This fax transmission is intended only for the addressee. It contains information that is legally privileged, confidential or otherwise protected from use or disclosure. If you are not the intended recipient, you are strictly prohibited from reviewing, disclosing, copying using or disseminating any of this information or taking any action in reliance on or regarding this information. If you have received this fax in error, please notify us immediately by telephone so that we can arrange for its return to [place]. Phone: [phone], Toll-Free: [phone], Fax: [phone] Page: 2 of 2 Call Id: [id number] Guidelines Guideline Title Affirmed Question Affirmed Notes Nurse Date/Time Eilene Ghazi Time) Headache [1] SEVERE headache (e.g., excruciating) AND [2] "worst headache" of life Irean Hong 08/13/2019 6:50:29 PM Disp. Time Eilene Ghazi Time) Disposition Final User 08/13/2019 6:53:17 PM Go to ED Now (or PCP triage) Yes Tawanna Solo, RN, Phineas Semen Disagree/Comply Comply Caller Understands Yes PreDisposition InappropriateToAsk Care Advice Given Per Guideline GO TO ED NOW (OR PCP TRIAGE): PAIN MEDICINES: * For pain relief, you can take either acetaminophen, ibuprofen, or naproxen. * CAUTION: Do not take ibuprofen or naproxen if you have stomach problems, kidney disease, are pregnant, or have been told by your doctor to avoid this type of anti-inflammatory drug. Do not take ibuprofen or naproxen for more than 7 days without consulting your doctor. ANOTHER ADULT SHOULD DRIVE: * It is better and safer if another adult drives instead of you. CARE ADVICE given per Headache (Adult) guideline. Referrals GO TO FACILITY  UNDECIDED

## 2019-08-17 NOTE — Telephone Encounter (Signed)
Left v/m requesting cb. fyi to Ozzie Hoyle and Vallarie Mare CMA.

## 2019-08-18 NOTE — Telephone Encounter (Signed)
mychart to patient.   Please call if patient has not read by tomorrow

## 2019-08-18 NOTE — Telephone Encounter (Signed)
Patient called back. She stated that she had a headache ever since midwife Darrol Poke prescribed on amlodipine 5 mg for patient blood pressure. Patient had her IUD removed on 08/12/2019 and BP was high in the GYN office then RX was prescribed.   No other symptoms  beside the dull constant headache  Please advise since Allie Bossier is out of the office and patient request to forward message to Dr Einar Pheasant since she saw her before.

## 2019-08-18 NOTE — Telephone Encounter (Signed)
Message left for patient to return my call.  Also this morning, reply back to the MyChart message for patient to call office

## 2019-08-18 NOTE — Telephone Encounter (Signed)
Is she able to check BP at home?   If headache started with amlodipine would recommend stopping.   If headache was present prior to starting medication would consider continuing medicine.   Headache can also be a sign of covid - should consider getting tested.   If office visits open today or tomorrow, would recommend visit to check blood pressure and assess.   Pt has had elevated BP in setting of pain over the last month, but was normal in October.

## 2019-08-18 NOTE — Telephone Encounter (Signed)
Spoke with patient and advised of everything. She checked her b/p today around 1:45 pm and it was 143/93. She had a headache on the day of her visit with GYN on 12/23 or 12/24 and that same day she started Amlodipine but the headache changed. Since that day has had constant headache all over her head, pressure feeling and feeling like when you are hanging upside down and "blood rushes down in your head." she has taking Excedrin migraine and extra strength Tylenol with no relief. She did taking Excedrin 4 tablets the other day and it took the edge off/helped head pressure but did not resolve.  She just lost her insurance and will have another one but not sure when. She asked about cost of appointment and she can not afford that right now. Any suggestions with this circumstance. Thank you

## 2019-08-19 NOTE — Telephone Encounter (Signed)
Patient read her mychart message from Dr Einar Pheasant and replied last night

## 2019-08-20 NOTE — Telephone Encounter (Signed)
Spoken and notified patient of Julia Millers comments. Patient stated that she stop taking the amlodipine and the headache has improved. BP yesterday was 128/85. She has been doing what Dr Einar Pheasant stated on MyChart that has been helping. Patient stated that the midwife that prescribed told her if any problems to contact primary provider since they do not monitor BP. Patient does not insurance right now but will be able to make an appointment when she get insurance again. Should she get monitor until she can be seen and stay off the amlodipine?

## 2019-08-20 NOTE — Telephone Encounter (Addendum)
I have not seen this patient in over one year so she needs an in office evaluation.  It sounds like the Amlodipine could be contributing to her headache but I'd need to evaluate her for further information. She is welcome to contact the person who initially treated her with Amlodipine to see if they are willing to switch the medication or further discuss.   I do recommend she see me for follow up if possible.

## 2019-08-20 NOTE — Telephone Encounter (Signed)
Noted. Will have her remain off of the amlodipine as headaches have improved. She will monitor BP and send readings in one week. See my chart message.

## 2019-08-28 ENCOUNTER — Other Ambulatory Visit: Payer: Self-pay | Admitting: Family Medicine

## 2019-08-28 DIAGNOSIS — M25561 Pain in right knee: Secondary | ICD-10-CM

## 2019-08-28 NOTE — Telephone Encounter (Signed)
I have not seen patient in over one year.  Needs visit with me before I can refill.

## 2019-08-28 NOTE — Telephone Encounter (Signed)
Last prescribed on 08/06/2019 by Dr Selena Batten. Last appointment on 08/11/2019 with Dr Selena Batten. No future appointment

## 2019-09-01 NOTE — Telephone Encounter (Signed)
Message left for patient to return my call.  

## 2019-09-02 NOTE — Telephone Encounter (Signed)
Spoken and notified patient of Kate Clark's comments. Patient verbalized understanding.  

## 2019-09-24 ENCOUNTER — Other Ambulatory Visit: Payer: Self-pay

## 2019-09-24 ENCOUNTER — Encounter: Payer: Self-pay | Admitting: Internal Medicine

## 2019-09-24 ENCOUNTER — Ambulatory Visit (INDEPENDENT_AMBULATORY_CARE_PROVIDER_SITE_OTHER): Payer: BC Managed Care – PPO | Admitting: Internal Medicine

## 2019-09-24 VITALS — Temp 98.6°F

## 2019-09-24 DIAGNOSIS — R0602 Shortness of breath: Secondary | ICD-10-CM

## 2019-09-24 DIAGNOSIS — J029 Acute pharyngitis, unspecified: Secondary | ICD-10-CM

## 2019-09-24 DIAGNOSIS — R0981 Nasal congestion: Secondary | ICD-10-CM

## 2019-09-24 DIAGNOSIS — R05 Cough: Secondary | ICD-10-CM

## 2019-09-24 DIAGNOSIS — Z20822 Contact with and (suspected) exposure to covid-19: Secondary | ICD-10-CM

## 2019-09-24 DIAGNOSIS — R059 Cough, unspecified: Secondary | ICD-10-CM

## 2019-09-24 DIAGNOSIS — R5383 Other fatigue: Secondary | ICD-10-CM

## 2019-09-24 DIAGNOSIS — R519 Headache, unspecified: Secondary | ICD-10-CM

## 2019-09-24 MED ORDER — ALBUTEROL SULFATE HFA 108 (90 BASE) MCG/ACT IN AERS: 2.0000 | INHALATION_SPRAY | Freq: Four times a day (QID) | RESPIRATORY_TRACT | 0 refills | Status: DC | PRN

## 2019-09-24 NOTE — Progress Notes (Signed)
Virtual Visit via Video Note  I connected with Julia Kline on 09/24/19 at  4:15 PM EST by a video enabled telemedicine application and verified that I am speaking with the correct person using two identifiers.  Location: Patient: Home Provider: Office   I discussed the limitations of evaluation and management by telemedicine and the availability of in person appointments. The patient expressed understanding and agreed to proceed.  History of Present Illness:  Pt reports headache, lightheadedness, nasal congestion, sore throat, cough, chest congestion and SOB. This started 4 days ago. The headache is located in her forehead. She reports the lightheadedness occurs if she stands up or turns her head too fast. She denies sensitivity to light, sound, nausea or vomiting. She is blowing yellow mucous out of her nose. Her sore throat has improved, she denies difficulty swallowing. The cough is productive of white/yellowmucous. The SOB occurs with exertion or prolong speaking. She denies fever, but has had chills and body aches. She has tried Tylenol and Mucinex with minimal relief. She had positive Covid exposure last Friday, she had a negative test on 09/22/19.  Past Medical History:  Diagnosis Date  . Eczema   . Hernia cerebri ([place])   . History of gestational diabetes 05/02/2017   Normal pp testing 10/2017  . History of severe pre-eclampsia 05/02/2017  . Hypertension   . Migraine   . PCOS (polycystic ovarian syndrome)   . Pregnancy induced hypertension     Current Outpatient Medications  Medication Sig Dispense Refill  . amLODipine (NORVASC) 5 MG tablet Take 1 tablet (5 mg total) by mouth daily. 30 tablet 2  . medroxyPROGESTERone (DEPO-PROVERA) 150 MG/ML injection Inject 1 mL (150 mg total) into the muscle every 3 (three) months. 1 mL 2   No current facility-administered medications for this visit.    Allergies  Allergen Reactions  . Ancef [Cefazolin] Hives and Swelling    Facial  and neck swelling; Had reaction after receiving fentanyl and ancef in the OR  . Fentanyl Hives and Swelling    Facial and neck swelling; Had reaction after receiving fentanyl and ancef in the OR  . Pseudoephedrine Hcl Other (See Comments)  . Shrimp [Shellfish Allergy] Other (See Comments)    Swelling of throat  . Morphine And Related Rash    Family History  Problem Relation Age of Onset  . Diabetes Mother   . Migraines Mother   . Diabetes Father   . Hypertension Father   . Hypertension Maternal Grandmother   . Diabetes Maternal Grandmother   . Diabetes Maternal Grandfather   . COPD Maternal Grandfather   . Diabetes Paternal Grandmother   . Hypertension Paternal Grandmother   . Diabetes Paternal Grandfather   . Hypertension Paternal Grandfather   . Heart attack Paternal Grandfather   . Asthma Sister     Social History   Socioeconomic History  . Marital status: Married    Spouse name: Not on file  . Number of children: Not on file  . Years of education: Not on file  . Highest education level: Not on file  Occupational History  . Not on file  Tobacco Use  . Smoking status: Never Smoker  . Smokeless tobacco: Never Used  Substance and Sexual Activity  . Alcohol use: No  . Drug use: No  . Sexual activity: Yes    Birth control/protection: I.U.D.  Other Topics Concern  . Not on file  Social History Narrative   Married.   2 children.  Works at [employer].   Social Determinants of Health   Financial Resource Strain:   . Difficulty of Paying Living Expenses: Not on file  Food Insecurity:   . Worried About Programme researcher, broadcasting/film/video in the Last Year: Not on file  . Ran Out of Food in the Last Year: Not on file  Transportation Needs:   . Lack of Transportation (Medical): Not on file  . Lack of Transportation (Non-Medical): Not on file  Physical Activity:   . Days of Exercise per Week: Not on file  . Minutes of Exercise per Session: Not on file  Stress:   .  Feeling of Stress : Not on file  Social Connections:   . Frequency of Communication with Friends and Family: Not on file  . Frequency of Social Gatherings with Friends and Family: Not on file  . Attends Religious Services: Not on file  . Active Member of Clubs or Organizations: Not on file  . Attends Banker Meetings: Not on file  . Marital Status: Not on file  Intimate Partner Violence:   . Fear of Current or Ex-Partner: Not on file  . Emotionally Abused: Not on file  . Physically Abused: Not on file  . Sexually Abused: Not on file     Constitutional: Pt reports fatigue, headache and body aches. Denies fever, malaise, or abrupt weight changes.  HEENT: Pt reports nasal congestion, sore throat. Denies eye pain, eye redness, ear pain, ringing in the ears, wax buildup, runny nose, bloody nose. Respiratory: Pt reports cough, SOB. Denies difficulty breathing.   Cardiovascular: Denies chest pain, chest tightness, palpitations or swelling in the hands or feet.  Musculoskeletal: Pt reports body aches. Denies decrease in range of motion, difficulty with gait, or joint pain and swelling.  Skin: Denies redness, rashes, lesions or ulcercations.   No other specific complaints in a complete review of systems (except as listed in HPI above).   Observations/Objective: Temp 98.6 F (37 C) (Temporal)   Wt Readings from Last 3 Encounters:  08/12/19 263 lb (119.3 kg)  06/18/19 252 lb 12 oz (114.6 kg)  04/24/18 249 lb 8 oz (113.2 kg)    General: Appears her stated age, obese, in NAD. HEENT: Head: normal shape and size; Nose: congestion noted; Throat/Mouth: no hoarseness noted Pulmonary/Chest: Normal effort. No respiratory distress.  Neurological: Alert and oriented.   BMET    Component Value Date/Time   NA 139 10/11/2017 1101   NA 145 (H) 10/09/2017 1500   K 3.7 10/11/2017 1101   CL 106 10/11/2017 1101   CO2 26 10/11/2017 1101   GLUCOSE 83 10/11/2017 1101   BUN 8 10/11/2017  1101   BUN 8 10/09/2017 1500   CREATININE 0.62 10/11/2017 1101   CALCIUM 8.0 (L) 10/11/2017 1101   GFRNONAA >60 10/11/2017 1101   GFRAA >60 10/11/2017 1101    Lipid Panel  No results found for: CHOL, TRIG, HDL, CHOLHDL, VLDL, LDLCALC  CBC    Component Value Date/Time   WBC 6.9 12/11/2017 0940   WBC 8.8 10/11/2017 1101   RBC 4.55 12/11/2017 0940   RBC 3.86 (L) 10/11/2017 1101   HGB 9.4 (L) 12/11/2017 0940   HGB 10.8 04/01/2017 0000   HCT 31.9 (L) 12/11/2017 0940   HCT 34 04/01/2017 0000   PLT 357 12/11/2017 0940   PLT 252 04/01/2017 0000   MCV 70 (L) 12/11/2017 0940   MCH 20.7 (L) 12/11/2017 0940   MCH 22.5 (L) 10/11/2017 1101  MCHC 29.5 (L) 12/11/2017 0940   MCHC 31.5 10/11/2017 1101   RDW 16.3 (H) 12/11/2017 0940    Hgb A1C Lab Results  Component Value Date   HGBA1C 5.9 (H) 07/19/2017        Assessment and Plan:  Exposure to Covid, Fatigue, Headache, Nasal Congestion., Sore Throat, Cough and SOB:  It is likely that she tested to early but will not plan on repeating testing at this time Discussed symptomatic care- rest and fluids Encouraged Tylenol or Ibuprofen for headaches and body aches Advised Flonase for nasal congestion, Mucinex for chest congestion RX for Albuterol 1-2 puffs Q4-6Hprn for SOB She declines RX for cough syrup at this time, recommend cough syrup OTC Encouraged self quarantine for 10 days Advised frequent handwashing, masking and social distancing even in the home.   ER precautions discussed  Follow Up Instructions:    I discussed the assessment and treatment plan with the patient. The patient was provided an opportunity to ask questions and all were answered. The patient agreed with the plan and demonstrated an understanding of the instructions.   The patient was advised to call back or seek an in-person evaluation if the symptoms worsen or if the condition fails to improve as anticipated.    Nicki Reaper, NP

## 2019-09-25 ENCOUNTER — Telehealth: Payer: Self-pay | Admitting: Primary Care

## 2019-09-25 ENCOUNTER — Encounter: Payer: Self-pay | Admitting: Internal Medicine

## 2019-09-25 NOTE — Telephone Encounter (Signed)
Patient called today. She had a virtual visit yesterday with Nicki Reaper, she was advised to have covid test done due to symptoms. She stated she received the results and they are positive. The patient would like to know what her next step should be or any advice on what she should do for treatment

## 2019-09-25 NOTE — Patient Instructions (Signed)

## 2019-09-25 NOTE — Telephone Encounter (Signed)
Spoken and notified patient of Kate Clark's comments. Patient verbalized understanding.  

## 2019-09-25 NOTE — Telephone Encounter (Signed)
Needs to quarantine 10 days from symptom onset. May come out after 10 days if fever free for 24 hours without fever reducing medication and symptoms have nearly resolved.  Tylenol for body aches, headaches, fevers. Robitussin for cough Lots of water to thin mucous and remain hydrated.

## 2019-09-28 ENCOUNTER — Emergency Department (HOSPITAL_COMMUNITY): Payer: HRSA Program

## 2019-09-28 ENCOUNTER — Other Ambulatory Visit: Payer: Self-pay

## 2019-09-28 ENCOUNTER — Emergency Department (HOSPITAL_COMMUNITY)
Admission: EM | Admit: 2019-09-28 | Discharge: 2019-09-29 | Disposition: A | Discharge status: Home / Self Care | Payer: HRSA Program | Attending: Emergency Medicine | Admitting: Emergency Medicine

## 2019-09-28 ENCOUNTER — Encounter (HOSPITAL_COMMUNITY): Payer: Self-pay | Admitting: Emergency Medicine

## 2019-09-28 ENCOUNTER — Telehealth: Payer: Self-pay

## 2019-09-28 DIAGNOSIS — R0789 Other chest pain: Secondary | ICD-10-CM | POA: Insufficient documentation

## 2019-09-28 DIAGNOSIS — R079 Chest pain, unspecified: Secondary | ICD-10-CM | POA: Diagnosis present

## 2019-09-28 DIAGNOSIS — U071 COVID-19: Secondary | ICD-10-CM | POA: Diagnosis not present

## 2019-09-28 DIAGNOSIS — Z8632 Personal history of gestational diabetes: Secondary | ICD-10-CM | POA: Diagnosis not present

## 2019-09-28 DIAGNOSIS — I10 Essential (primary) hypertension: Secondary | ICD-10-CM | POA: Diagnosis not present

## 2019-09-28 LAB — CBC WITH DIFFERENTIAL/PLATELET
Abs Immature Granulocytes: 0.02 10*3/uL (ref 0.00–0.07)
Basophils Absolute: 0 10*3/uL (ref 0.0–0.1)
Basophils Relative: 1 %
Eosinophils Absolute: 0 10*3/uL (ref 0.0–0.5)
Eosinophils Relative: 0 %
HCT: 39.9 % (ref 36.0–46.0)
Hemoglobin: 11.7 g/dL — ABNORMAL LOW (ref 12.0–15.0)
Immature Granulocytes: 1 %
Lymphocytes Relative: 43 %
Lymphs Abs: 1.5 10*3/uL (ref 0.7–4.0)
MCH: 21.8 pg — ABNORMAL LOW (ref 26.0–34.0)
MCHC: 29.3 g/dL — ABNORMAL LOW (ref 30.0–36.0)
MCV: 74.4 fL — ABNORMAL LOW (ref 80.0–100.0)
Monocytes Absolute: 0.4 10*3/uL (ref 0.1–1.0)
Monocytes Relative: 12 %
Neutro Abs: 1.5 10*3/uL — ABNORMAL LOW (ref 1.7–7.7)
Neutrophils Relative %: 43 %
Platelets: 245 10*3/uL (ref 150–400)
RBC: 5.36 MIL/uL — ABNORMAL HIGH (ref 3.87–5.11)
RDW: 13.7 % (ref 11.5–15.5)
WBC: 3.5 10*3/uL — ABNORMAL LOW (ref 4.0–10.5)
nRBC: 0 % (ref 0.0–0.2)

## 2019-09-28 LAB — I-STAT BETA HCG BLOOD, ED (MC, WL, AP ONLY): I-stat hCG, quantitative: <5 m[IU]/mL (ref <5)

## 2019-09-28 LAB — BASIC METABOLIC PANEL
Anion gap: 13 (ref 5–15)
BUN: 7 mg/dL (ref 6–20)
CO2: 21 mmol/L — ABNORMAL LOW (ref 22–32)
Calcium: 9.2 mg/dL (ref 8.9–10.3)
Chloride: 105 mmol/L (ref 98–111)
Creatinine, Ser: 0.63 mg/dL (ref 0.44–1.00)
GFR calc Af Amer: >60 mL/min (ref >60)
GFR calc non Af Amer: >60 mL/min (ref >60)
Glucose, Bld: 174 mg/dL — ABNORMAL HIGH (ref 70–99)
Potassium: 4.1 mmol/L (ref 3.5–5.1)
Sodium: 139 mmol/L (ref 135–145)

## 2019-09-28 LAB — HEPATIC FUNCTION PANEL
ALT: 54 U/L — ABNORMAL HIGH (ref 0–44)
AST: 44 U/L — ABNORMAL HIGH (ref 15–41)
Albumin: 4 g/dL (ref 3.5–5.0)
Alkaline Phosphatase: 52 U/L (ref 38–126)
Bilirubin, Direct: 0.2 mg/dL (ref 0.0–0.2)
Indirect Bilirubin: 0 mg/dL — ABNORMAL LOW (ref 0.3–0.9)
Total Bilirubin: 0.2 mg/dL — ABNORMAL LOW (ref 0.3–1.2)
Total Protein: 7.3 g/dL (ref 6.5–8.1)

## 2019-09-28 LAB — LIPASE, BLOOD: Lipase: 25 U/L (ref 11–51)

## 2019-09-28 LAB — TROPONIN I (HIGH SENSITIVITY)
Troponin I (High Sensitivity): <2 ng/L (ref <18)
Troponin I (High Sensitivity): <2 ng/L (ref <18)

## 2019-09-28 MED ORDER — SODIUM CHLORIDE 0.9 % IV BOLUS
1000.0000 mL | Freq: Once | INTRAVENOUS | Status: AC
  Administered 2019-09-28: 1000 mL via INTRAVENOUS

## 2019-09-28 MED ORDER — PREDNISONE 10 MG PO TABS: 40.0000 mg | ORAL_TABLET | Freq: Every day | ORAL | 0 refills | Status: AC

## 2019-09-28 MED ORDER — TECHNETIUM TO 99M ALBUMIN AGGREGATED
1.6000 | Freq: Once | INTRAVENOUS | Status: AC | PRN
  Administered 2019-09-28: 1.6 via INTRAVENOUS

## 2019-09-28 MED ORDER — IBUPROFEN 400 MG PO TABS
400.0000 mg | ORAL_TABLET | Freq: Once | ORAL | Status: AC
  Administered 2019-09-28: 400 mg via ORAL
  Filled 2019-09-28: qty 1

## 2019-09-28 MED ORDER — PREDNISONE 20 MG PO TABS
40.0000 mg | ORAL_TABLET | Freq: Once | ORAL | Status: AC
  Administered 2019-09-29: 40 mg via ORAL
  Filled 2019-09-28: qty 2

## 2019-09-28 MED ORDER — ALBUTEROL SULFATE HFA 108 (90 BASE) MCG/ACT IN AERS
1.0000 | INHALATION_SPRAY | Freq: Once | RESPIRATORY_TRACT | Status: AC
  Administered 2019-09-29: 2 via RESPIRATORY_TRACT
  Filled 2019-09-28: qty 6.7

## 2019-09-28 MED ORDER — AEROCHAMBER PLUS FLO-VU MISC
1.0000 | Freq: Once | Status: AC
  Administered 2019-09-29: 1
  Filled 2019-09-28: qty 1

## 2019-09-28 MED ORDER — IOHEXOL 350 MG/ML SOLN
100.0000 mL | Freq: Once | INTRAVENOUS | Status: AC | PRN
  Administered 2019-09-28: 100 mL via INTRAVENOUS

## 2019-09-28 NOTE — Discharge Instructions (Addendum)
You have been diagnosed today with COVID-19 infection, atypical chest pain.  At this time there does not appear to be the presence of an emergent medical condition, however there is always the potential for conditions to change. Please read and follow the below instructions.  Please return to the Emergency Department immediately for any new or worsening symptoms. Please be sure to follow up with your Primary Care Provider within one week regarding your visit today; please call their office to schedule an appointment even if you are feeling better for a follow-up visit. You have been given your first dose of the steroid medication prednisone today.  You may continue using the medication as prescribed starting tomorrow.  You may use the albuterol inhaler provided to you today to help with shortness of breath.  If he feels medication is not helping her shortness of breath and your shortness of breath is worsening return to the ER for evaluation. Please drink plenty of water and get plenty of rest.  You may continue using over-the-counter anti-inflammatory medication such as ibuprofen as directed on the packaging to help with your symptoms.  Get help right away if: Your chest pain is worse. You have a cough that gets worse, or you cough up blood. You have very bad (severe) pain in your belly (abdomen). You pass out (faint). You have either of these for no clear reason: Sudden chest discomfort. Sudden discomfort in your arms, back, neck, or jaw. You have shortness of breath at any time. You suddenly start to sweat, or your skin gets clammy. You feel sick to your stomach (nauseous). You throw up (vomit). You suddenly feel lightheaded or dizzy. You feel very weak or tired. Your heart starts to beat fast, or it feels like it is skipping beats.  Get help right away if: You have trouble breathing. You have pain or pressure in your chest. You have confusion. You have bluish lips and  fingernails. You have difficulty waking from sleep. You have symptoms that get worse. You have any new/concerning or worsening of symptoms  Please read the additional information packets attached to your discharge summary.  Do not take your medicine if  develop an itchy rash, swelling in your mouth or lips, or difficulty breathing; call 911 and seek immediate emergency medical attention if this occurs.  Note: Portions of this text may have been transcribed using voice recognition software. Every effort was made to ensure accuracy; however, inadvertent computerized transcription errors may still be present.

## 2019-09-28 NOTE — ED Triage Notes (Signed)
Pt covid + on Tuesday. Endorses CP, lightheadedness and dizziness.

## 2019-09-28 NOTE — Telephone Encounter (Signed)
[facility] Day - Client TELEPHONE ADVICE RECORD [facility] Patient Name: Julia Kline Gender: Female DOB: 01-30-1986 Age: 34 Y 11 M 14 D Return Phone Number: [phone] (Primary) Address: City/State/Zip: [place] [address] Client [facility] Day - Client Client Site  [facility] - Day Physician Vernona Rieger - NP Contact Type Call Who Is Calling Patient / Member / Family / Caregiver Call Type Triage / Clinical Relationship To Patient Self Return Phone Number [phone] (Primary) Chief Complaint CHEST PAIN (>=21 years) - pain, pressure, heaviness or tightness Reason for Call Symptomatic / Request for Health Information Initial Comment Robin with Dr. Vernona Rieger transferred a caller who states she has been diagnosed with Covid-19 on 2/2. Caller states she is having chest pain and shortness of breath. GOTO Facility Not Listed [place] Translation No Nurse Assessment Nurse: Violeta Gelinas, RN, Regulatory affairs officer (Eastern Time): 09/28/2019 3:31:43 PM Confirm and document reason for call. If symptomatic, describe symptoms. ---Caller states she is having chest pain and shortness of breath, diagnosed with covid on 2/2. Has the patient had close contact with a person known or suspected to have the novel coronavirus illness OR traveled / lives in area with major community spread (including international travel) in the last 14 days from the onset of symptoms? * If Asymptomatic, screen for exposure and travel within the last 14 days. ---Yes Does the patient have any new or worsening symptoms? ---Yes Will a triage be completed? ---Yes Related visit to physician within the last 2 weeks? ---Yes Does the PT have any chronic conditions? (i.e. diabetes, asthma, this includes High risk factors for pregnancy, etc.) ---Yes List chronic conditions. ---HTN Is the patient pregnant or possibly pregnant? (Ask all females between the  ages of 35-55) ---No Is this a behavioral health or substance abuse call? ---No Guidelines Guideline Title Affirmed Question Affirmed Notes Nurse Date/Time (Eastern Time) Coronavirus (COVID-19) - Diagnosed or Suspected SEVERE or constant chest pain or pressure [place], RN, [facility] 09/28/2019 3:33:09 PM PLEASE NOTE: All timestamps contained within this report are represented as [place] Standard Time. CONFIDENTIALTY NOTICE: This fax transmission is intended only for the addressee. It contains information that is legally privileged, confidential or otherwise protected from use or disclosure. If you are not the intended recipient, you are strictly prohibited from reviewing, disclosing, copying using or disseminating any of this information or taking any action in reliance on or regarding this information. If you have received this fax in error, please notify us immediately by telephone so that we can arrange for its return to [place]. Phone: [phone], Toll-Free: [phone], Fax: [phone] Page: 2 of 2 Call Id: [id number] Guidelines Guideline Title Affirmed Question Affirmed Notes Nurse Date/Time Lamount Cohen Time) (Exception: mild central chest pain, present only when coughing) Disp. Time Lamount Cohen Time) Disposition Final User 09/28/2019 3:30:11 PM Send to Urgent Queue [place] Councilman 09/28/2019 3:34:35 PM Go to ED Now Yes Violeta Gelinas, RN, [facility] Caller Disagree/Comply Comply Caller Understands Yes PreDisposition Did not know what to do Care Advice Given Per Guideline GO TO ED NOW: * You need to be seen in the Emergency Department. * Go to the ED at ___________ Hospital. * Leave now. Drive carefully. WEAR A MASK - COVER YOUR MOUTH AND NOSE: ANOTHER ADULT SHOULD DRIVE: YOU SHOULD TELL HEALTHCARE PERSONNEL THAT YOU MIGHT HAVE COVID-19: * Tell them you have symptoms and have been sent for COVID-19 testing. * Lips or face turns blue * Severe difficulty breathing occurs CALL EMS 911  IF: *  Confusion occurs. CARE ADVICE given per CORONAVIRUS (COVID-19) - DIAGNOSED OR SUSPECTED (Adult) guideline. Referrals GO TO FACILITY OTHER - SPECIFY

## 2019-09-28 NOTE — Telephone Encounter (Signed)
Noted  

## 2019-09-28 NOTE — ED Notes (Signed)
Pt's O2 sats went to 93% while ambulating. No increased WOB noted.

## 2019-09-28 NOTE — ED Provider Notes (Addendum)
[facility] EMERGENCY DEPARTMENT Provider Note   CSN: [id number] Arrival date & time: 09/28/19  1617     History Chief Complaint  Patient presents with  . Chest Pain  . Dizziness    Julia Kline is a 34 y.o. female history of obesity, PCOS, migraines, hypertension, gestational diabetes.  Patient presents today for 1 week of chest pain, shortness of breath, fatigue and cough.  Symptom onset 7 days ago gradually worsening.  She describes chest pain as a central sharp pain severe nonradiating worsened with deep breathing and coughing and improved with rest and sleep.  She reports shortness of breath as intermittent only present with long periods of exertion.  She reports cough as an intermittent productive with yellow/white sputum.  She describes fatigue as a feeling of tiredness.  She reports that she was tested for COVID-19 7 days ago on first day of symptom onset and was positive.  She denies fever/chills, headache/vision changes, neck pain, hemoptysis, abdominal pain, nausea/vomiting, diarrhea, dysuria/hematuria, extremity swelling/color change, numbness/tingling, weakness, fall/injury or any additional concerns.  HPI     Past Medical History:  Diagnosis Date  . Eczema   . Hernia cerebri (HCC)   . History of gestational diabetes 05/02/2017   Normal pp testing 10/2017  . History of severe pre-eclampsia 05/02/2017  . Hypertension   . Migraine   . PCOS (polycystic ovarian syndrome)   . Pregnancy induced hypertension     Patient Active Problem List   Diagnosis Date Noted  . Acute pain of right knee 08/06/2019  . Acute conjunctivitis of right eye 07/10/2019  . Mastitis 06/18/2019  . Essential hypertension in postpartum patient 04/01/2018  . Migraines 04/01/2018  . Abnormal uterine bleeding (AUB) 01/06/2018  . IUD (intrauterine device) in place 01/06/2018  . Obesity (BMI 30-39.9) 08/14/2017    Past Surgical History:  Procedure Laterality Date  .  CESAREAN SECTION  2017   Procedure: CESAREAN DELIVERY ONLY; Surgeon: Marian Sorrow, MD; Location: [facility]; Service: Obstetrics  . CESAREAN SECTION N/A 10/02/2017   Procedure: CESAREAN SECTION;  Surgeon: Tilda Burrow, MD;  Location: [facility];  Service: Obstetrics;  Laterality: N/A;  . LAPAROSCOPIC OOPHERECTOMY Left   . LEEP    . OVARIAN CYST REMOVAL    . TONSILLECTOMY    . WISDOM TOOTH EXTRACTION       OB History    Gravida  2   Para  2   Term  2   Preterm  0   AB  0   Living  2     SAB  0   TAB  0   Ectopic  0   Multiple  0   Live Births  2           Family History  Problem Relation Age of Onset  . Diabetes Mother   . Migraines Mother   . Diabetes Father   . Hypertension Father   . Hypertension Maternal Grandmother   . Diabetes Maternal Grandmother   . Diabetes Maternal Grandfather   . COPD Maternal Grandfather   . Diabetes Paternal Grandmother   . Hypertension Paternal Grandmother   . Diabetes Paternal Grandfather   . Hypertension Paternal Grandfather   . Heart attack Paternal Grandfather   . Asthma Sister     Social History   Tobacco Use  . Smoking status: Never Smoker  . Smokeless tobacco: Never Used  Substance Use Topics  . Alcohol use: No  . Drug  use: No    Home Medications Prior to Admission medications   Medication Sig Start Date End Date Taking? Authorizing Provider  albuterol (VENTOLIN HFA) 108 (90 Base) MCG/ACT inhaler Inhale 2 puffs into the lungs every 6 (six) hours as needed for wheezing or shortness of breath. 09/24/19   Lorre Munroe, NP  medroxyPROGESTERone (DEPO-PROVERA) 150 MG/ML injection Inject 1 mL (150 mg total) into the muscle every 3 (three) months. 08/12/19   Sharyon Cable, CNM  predniSONE (DELTASONE) 10 MG tablet Take 4 tablets (40 mg total) by mouth daily for 4 days. 09/29/19 10/03/19  Harlene Salts A, PA-C    Allergies    Ancef [cefazolin], Fentanyl, Pseudoephedrine hcl, Shrimp  [shellfish allergy], and Morphine and related  Review of Systems   Review of Systems Ten systems are reviewed and are negative for acute change except as noted in the HPI  Physical Exam Updated Vital Signs BP 132/89   Pulse 94   Temp 99.1 F (37.3 C) (Oral)   Resp 14   Ht 5\' 9"  (1.753 m)   Wt 117.9 kg   SpO2 96%   BMI 38.40 kg/m   Physical Exam Constitutional:      General: She is not in acute distress.    Appearance: Normal appearance. She is well-developed. She is obese. She is not ill-appearing or diaphoretic.  HENT:     Head: Normocephalic and atraumatic.     Right Ear: External ear normal.     Left Ear: External ear normal.     Nose: Nose normal.  Eyes:     General: Vision grossly intact. Gaze aligned appropriately.     Pupils: Pupils are equal, round, and reactive to light.  Neck:     Trachea: Trachea and phonation normal. No tracheal deviation.  Cardiovascular:     Rate and Rhythm: Normal rate and regular rhythm.     Pulses:          Radial pulses are 2+ on the right side and 2+ on the left side.       Dorsalis pedis pulses are 1+ on the right side and 2+ on the left side.     Heart sounds: Normal heart sounds.  Pulmonary:     Effort: Pulmonary effort is normal. No respiratory distress.     Breath sounds: Normal breath sounds.  Chest:     Chest wall: Tenderness present. No deformity or crepitus.  Abdominal:     General: There is no distension.     Palpations: Abdomen is soft.     Tenderness: There is no abdominal tenderness. There is no guarding or rebound.  Musculoskeletal:        General: Normal range of motion.     Cervical back: Normal range of motion.     Right lower leg: No tenderness. No edema.     Left lower leg: No tenderness. No edema.  Skin:    General: Skin is warm and dry.  Neurological:     Mental Status: She is alert.     GCS: GCS eye subscore is 4. GCS verbal subscore is 5. GCS motor subscore is 6.     Comments: Speech is clear and goal  oriented, follows commands Major Cranial nerves without deficit, no facial droop Moves extremities without ataxia, coordination intact  Psychiatric:        Behavior: Behavior normal.     ED Results / Procedures / Treatments   Labs (all labs ordered are listed, but only abnormal  results are displayed) Labs Reviewed  BASIC METABOLIC PANEL - Abnormal; Notable for the following components:      Result Value   CO2 21 (*)    Glucose, Bld 174 (*)    All other components within normal limits  CBC WITH DIFFERENTIAL/PLATELET - Abnormal; Notable for the following components:   WBC 3.5 (*)    RBC 5.36 (*)    Hemoglobin 11.7 (*)    MCV 74.4 (*)    MCH 21.8 (*)    MCHC 29.3 (*)    Neutro Abs 1.5 (*)    All other components within normal limits  HEPATIC FUNCTION PANEL - Abnormal; Notable for the following components:   AST 44 (*)    ALT 54 (*)    Total Bilirubin 0.2 (*)    Indirect Bilirubin 0.0 (*)    All other components within normal limits  LIPASE, BLOOD  URINALYSIS, ROUTINE W REFLEX MICROSCOPIC  I-STAT BETA HCG BLOOD, ED ([facility], [facility], [facility] ONLY)  TROPONIN I (HIGH SENSITIVITY)  TROPONIN I (HIGH SENSITIVITY)    EKG EKG Interpretation  Date/Time:  Monday September 28 2019 17:52:33 EST Ventricular Rate:  109 PR Interval:    QRS Duration: 87 QT Interval:  311 QTC Calculation: 419 R Axis:   64 Text Interpretation: Sinus tachycardia Borderline T abnormalities, inferior leads No old tracing to compare Confirmed by Daleen Bo [phone]) on 09/28/2019 7:07:08 PM   Radiology CT Angio Chest PE W and/or Wo Contrast  Result Date: 09/28/2019 CLINICAL DATA:  34 year old female with pleuritic chest pain and tachycardia. Positive COVID-19. EXAM: CT ANGIOGRAPHY CHEST WITH CONTRAST TECHNIQUE: Multidetector CT imaging of the chest was performed using the standard protocol during bolus administration of intravenous contrast. Multiplanar CT image reconstructions and MIPs were obtained to evaluate the  vascular anatomy. CONTRAST:  137mL OMNIPAQUE IOHEXOL 350 MG/ML SOLN COMPARISON:  Chest radiograph dated 09/28/2019. FINDINGS: Cardiovascular: There is no cardiomegaly or pericardial effusion. The thoracic aorta and central pulmonary arteries appear unremarkable. Evaluation for pulmonary embolism is very limited, almost nondiagnostic due to suboptimal opacification and timing of the contrast. No large central pulmonary artery embolus identified. V/Q scan may provide better evaluation if there is high clinical concern for acute PE. Mediastinum/Nodes: No hilar or mediastinal adenopathy. The esophagus and the thyroid gland are grossly unremarkable. No mediastinal fluid collection. Lungs/Pleura: Small clusters of ground-glass density primarily in the right lower lobe as well as in the right upper lobe/perihilar region in keeping with known COVID-19. No lobar consolidation, pleural effusion, or pneumothorax. The central airways are patent. Upper Abdomen: Fatty infiltration of the liver. Musculoskeletal: No chest wall abnormality. No acute or significant osseous findings. Review of the MIP images confirms the above findings. IMPRESSION: Small clusters of hazy density in the right lung in keeping with known COVID-19 infection. No other acute findings. Electronically Signed   By: Anner Crete M.D.   On: 09/28/2019 20:27   NM Pulmonary Perfusion  Result Date: 09/28/2019 CLINICAL DATA:  Chest pain, tachycardia.  Nondiagnostic CTA chest EXAM: NUCLEAR MEDICINE PERFUSION LUNG SCAN TECHNIQUE: Perfusion images were obtained in multiple projections after intravenous injection of radiopharmaceutical. Ventilation scans intentionally deferred if perfusion scan and chest x-ray adequate for interpretation during COVID 19 epidemic. RADIOPHARMACEUTICALS:  1.6 mCi Tc-59m MAA IV COMPARISON:  CTA chest and chest x-ray earlier today FINDINGS: No perfusion defects to suggest pulmonary embolus. IMPRESSION: No evidence of pulmonary  embolus. Electronically Signed   By: Rolm Baptise M.D.   On: 09/28/2019 22:52  DG Chest Port 1 View  Result Date: 09/28/2019 CLINICAL DATA:  COVID-19 positive, chest pain, dizziness EXAM: PORTABLE CHEST 1 VIEW COMPARISON:  None. FINDINGS: The heart size and mediastinal contours are within normal limits. Both lungs are clear. The visualized skeletal structures are unremarkable. IMPRESSION: No active disease. Electronically Signed   By: Sharlet Salina M.D.   On: 09/28/2019 17:11    Procedures Procedures (including critical care time)  Medications Ordered in ED Medications  predniSONE (DELTASONE) tablet 40 mg (has no administration in time range)  albuterol (VENTOLIN HFA) 108 (90 Base) MCG/ACT inhaler 1-2 puff (has no administration in time range)  aerochamber plus with mask device 1 each (has no administration in time range)  sodium chloride 0.9 % bolus 1,000 mL (1,000 mLs Intravenous New Bag/Given 09/28/19 1957)  ibuprofen (ADVIL) tablet 400 mg (400 mg Oral Given 09/28/19 1943)  iohexol (OMNIPAQUE) 350 MG/ML injection 100 mL (100 mLs Intravenous Contrast Given 09/28/19 2001)  technetium albumin aggregated (MAA) injection solution 1.6 millicurie (1.6 millicuries Intravenous Contrast Given 09/28/19 2236)    ED Course  I have reviewed the triage vital signs and the nursing notes.  Pertinent labs & imaging results that were available during my care of the patient were reviewed by me and considered in my medical decision making (see chart for details).    MDM Rules/Calculators/A&P                     34 year old female history of obesity, hypertension, PCOS, migraines and gestational diabetes presented today in her seventh day of illness Covid positive.  Her main complaints are chest pain and shortness of breath worsening for the past 1 week.  She describes a pleuritic chest pain worsened with inspiration and coughing.  On initial evaluation she is tired appearing, nontoxic and in no acute distress.   Cranial nerves intact, no meningeal signs, she is tachycardic heart rate approximately 120 bpm no rubs murmurs or gallops auscultated, lungs are clear bilaterally, abdomen soft nontender without peritoneal signs, neurovascular intact to all 4 extremities without evidence of DVT.  Will obtain chest pain work-up at this time and keep patient on cardiac and pulse oximetry during visit.  She is not hypoxic during initial evaluation but is very tachycardic.  She is on birth control and being Covid positive is at risk for hypercoagulability, cannot use PERC rule.  Feel it is pertinent at this time to obtain CT angio PE study for evaluation of possible pulmonary embolism.  She is not requiring oxygen at this time and symptoms may be due to COVID-19 viral infection, will continue monitoring do not feel there is indication for heparin at this time. - Beta-hCG negative High-sensitivity troponin negative CBC with leukopenia consistent with known Covid, hemoglobin 11.7 improved from priors BMP with elevated glucose of 174 Hepatic function panel with mild elevation of AST and ALT consistent with viral process Lipase within normal limits Chest x-ray:  IMPRESSION:  No active disease.   EKG: Sinus tachycardia Borderline T abnormalities, inferior leads No old tracing to compare Confirmed by Mancel Bale [phone]) on 09/28/2019 7:07:08 PM - I contacted radiology to see where patient is in line for CT scan, they are waiting an IV at this time which nurse is obtaining.  Patient will be brought back next for CT angiogram.  She was reassessed resting comfortably no acute distress remains mildly tachycardic.  Ibuprofen and fluid bolus has been ordered. - CT angio PE study:  IMPRESSION:  Small clusters  of hazy density in the right lung in keeping with  known COVID-19 infection. No other acute findings.   Unfortunately it appears that contrast timing was inadequate for evaluation of pulmonary embolism today.  Will need to  obtain a V/Q study at this time.  Discussed case with Dr. Effie Shy. - Delta troponin negative - Patient reevaluated sleeping easily arousable to voice.  Tachycardia has improved with fluid bolus, heart rate now in the 90s.  She reports improvement of her pain and shortness of breath as well.  She is ambulated with nursing staff and saturation decreased down to 93% on room air but no increased work of breathing.  Patient states understanding of plan and is agreeable for VQ study. - 10:15 PM: Patient reassessed, sleeping, easily arousable to voice.  Vital signs stable, no tachycardia or hypoxia on room air.  Reports she is feeling better, she continues to await VQ scan. - NM Pulmonary Perfusion study:    IMPRESSION:  No evidence of pulmonary embolus.   Suspect patient's chest pain shortness of breath and tachycardia secondary to COVID-19 viral infection at this time.  Tachycardia earlier likely also secondary to dehydration which improved with fluid bolus.  No evidence of ACS, PE, dissection or other emergent cardiopulmonary pathologies at this time.  Additionally opacity seen on CT scan are consistent with COVID-19 viral infection, doubt bacterial pneumonia, no indication for antibiotics at this time. - 11:20 PM: Patient reassessed resting comfortably in bed no acute distress.  Vital signs stable no tachycardia or hypoxia on room air.  She has no submental O2 requirement. There is no indication for admission at this time.  She states understanding of work-up as above and has no questions at this time.  She is agreeable to discharge with prednisone and albuterol.  She will follow-up with her PCP for a televisit this week.  We will start patient on 5-day prednisone burst, denies active diabetes or adverse reaction to steroids.  Additionally will give albuterol inhaler with spacer, she reports that she has albuterol at home but would like a refill.  At this time there does not appear to be any evidence of  an acute emergency medical condition and the patient appears stable for discharge with appropriate outpatient follow up. Diagnosis was discussed with patient who verbalizes understanding of care plan and is agreeable to discharge. I have discussed return precautions with patient who verbalizes understanding of return precautions. Patient encouraged to follow-up with their PCP. All questions answered.  Patient's case discussed with Dr. Effie Shy who agrees with plan to discharge with follow-up.   Julia Kline was evaluated in Emergency Department on 09/28/2019 for the symptoms described in the history of present illness. She was evaluated in the context of the global COVID-19 pandemic, which necessitated consideration that the patient might be at risk for infection with the SARS-CoV-2 virus that causes COVID-19. Institutional protocols and algorithms that pertain to the evaluation of patients at risk for COVID-19 are in a state of rapid change based on information released by regulatory bodies including the CDC and federal and state organizations. These policies and algorithms were followed during the patient's care in the ED.  Note: Portions of this report may have been transcribed using voice recognition software. Every effort was made to ensure accuracy; however, inadvertent computerized transcription errors may still be present. Final Clinical Impression(s) / ED Diagnoses Final diagnoses:  COVID-19 virus infection  Nonspecific chest pain    Rx / DC Orders ED Discharge Orders  Ordered    predniSONE (DELTASONE) 10 MG tablet  Daily     09/28/19 2314           Elizabeth PalauMorelli, Bentli Llorente A, PA-C 09/28/19 2325    Elizabeth PalauMorelli, Josejulian Tarango A, PA-C 09/28/19 2327    Mancel BaleWentz, Elliott, MD 10/01/19 [phone]

## 2019-09-28 NOTE — Telephone Encounter (Signed)
Per chart review pt is at [facility]. 

## 2019-09-29 NOTE — ED Notes (Signed)
Patient verbalizes understanding of discharge instructions. Opportunity for questioning and answers were provided. All questions answered completely. PIV removed, catheter intact. Site dressed with gauze and tape. Armband removed by staff, pt discharged from ED. Ambulatory with strong, steady gait 

## 2019-09-29 NOTE — ED Notes (Signed)
All meds given per MAR. Name/DOB verified with pt 

## 2019-09-29 NOTE — ED Notes (Signed)
Assumed care of pt. Pt alert, resting on cart in NAD. Breathing easy, non-labored. VSS on monitors. Call light within reach. 

## 2019-10-13 ENCOUNTER — Ambulatory Visit (INDEPENDENT_AMBULATORY_CARE_PROVIDER_SITE_OTHER): Payer: Self-pay | Admitting: Obstetrics and Gynecology

## 2019-10-13 ENCOUNTER — Other Ambulatory Visit: Payer: Self-pay

## 2019-10-13 ENCOUNTER — Encounter: Payer: Self-pay | Admitting: Obstetrics and Gynecology

## 2019-10-13 VITALS — BP 128/87 | HR 103 | Wt 255.0 lb

## 2019-10-13 DIAGNOSIS — N939 Abnormal uterine and vaginal bleeding, unspecified: Secondary | ICD-10-CM

## 2019-10-13 DIAGNOSIS — E282 Polycystic ovarian syndrome: Secondary | ICD-10-CM

## 2019-10-13 DIAGNOSIS — Z3042 Encounter for surveillance of injectable contraceptive: Secondary | ICD-10-CM

## 2019-10-13 DIAGNOSIS — N921 Excessive and frequent menstruation with irregular cycle: Secondary | ICD-10-CM | POA: Insufficient documentation

## 2019-10-13 DIAGNOSIS — E669 Obesity, unspecified: Secondary | ICD-10-CM

## 2019-10-13 DIAGNOSIS — Z98891 History of uterine scar from previous surgery: Secondary | ICD-10-CM

## 2019-10-13 DIAGNOSIS — Z3202 Encounter for pregnancy test, result negative: Secondary | ICD-10-CM

## 2019-10-13 DIAGNOSIS — I10 Essential (primary) hypertension: Secondary | ICD-10-CM | POA: Insufficient documentation

## 2019-10-13 LAB — CBC
Hematocrit: 31.7 % — ABNORMAL LOW (ref 34.0–46.6)
Hemoglobin: 9.9 g/dL — ABNORMAL LOW (ref 11.1–15.9)
MCH: 22.4 pg — ABNORMAL LOW (ref 26.6–33.0)
MCHC: 31.2 g/dL — ABNORMAL LOW (ref 31.5–35.7)
MCV: 72 fL — ABNORMAL LOW (ref 79–97)
Platelets: 296 10*3/uL (ref 150–450)
RBC: 4.42 x10E6/uL (ref 3.77–5.28)
RDW: 14.3 % (ref 11.7–15.4)
WBC: 9.4 10*3/uL (ref 3.4–10.8)

## 2019-10-13 LAB — POCT URINE PREGNANCY: Preg Test, Ur: NEGATIVE

## 2019-10-13 NOTE — Patient Instructions (Signed)
You are due for another depo shot March 10th-24th.  If you aren't having any bleeding issues at that time, I'd recommend another depo shot.  You can also consider the Nexplanon or the progestin only birth control pill.  Also, look into family planning medicaid if you have issues with insurance in the future.

## 2019-10-13 NOTE — Progress Notes (Signed)
Pt states bleeding began in December and then heavy bleeding with clots began 08/27/19 and ended 10/10/19

## 2019-10-13 NOTE — Progress Notes (Signed)
Obstetrics and Gynecology Established Patient Evaluation  Appointment Date: 10/13/2019  OBGYN Clinic: [facility]  Primary Care Provider: Pleas Koch  Referring Provider: Self  Chief Complaint:  Chief Complaint  Patient presents with  . Vaginal Bleeding    History of Present Illness: Julia Kline is a 34 y.o. African-American G2P2002 (No LMP recorded (lmp unknown).), seen for the above chief complaint. Her past medical history is significant for BMI 30s,  HTN, PCOS, h/o c-section x 2, h/o LSO, h/o LEEP  Patient endorsing AUB from early Jan to this past Saturday. She got Depo Provera on 12/23 when she had her LNG IUD removed because her husband could feel it and she felt it during intercourse; her periods were controlled on it and they were rare, spotting and not painful when she did have a period.  The AUB was at least 3 pads per day, not really painful, unsure if had a period  Review of Systems: Pertinent items are noted in HPI.   Patient Active Problem List   Diagnosis Date Noted  . Anemia 10/14/2019  . Hypertension 10/13/2019  . Breakthrough bleeding on depo provera 10/13/2019  . Migraines 04/01/2018  . Abnormal uterine bleeding (AUB) 01/06/2018  . Obesity (BMI 30-39.9) 08/14/2017    Past Medical History:  Past Medical History:  Diagnosis Date  . Acute conjunctivitis of right eye 07/10/2019  . Acute pain of right knee 08/06/2019  . Eczema   . Hernia cerebri ([place])   . History of gestational diabetes 05/02/2017   Normal pp testing 10/2017  . History of severe pre-eclampsia 05/02/2017  . Hypertension   . Mastitis 06/18/2019  . Migraine   . PCOS (polycystic ovarian syndrome)   . Pregnancy induced hypertension     Past Surgical History:  Past Surgical History:  Procedure Laterality Date  . CESAREAN SECTION  2017   Procedure: CESAREAN DELIVERY ONLY; Surgeon: Lina Sar, MD; [facility]: [facility]; Service:  Obstetrics  . CESAREAN SECTION N/A 10/02/2017   Procedure: CESAREAN SECTION;  Surgeon: Jonnie Kind, MD;  Location: [place];  Service: Obstetrics;  Laterality: N/A;  . LAPAROSCOPIC OOPHERECTOMY Left   . LEEP    . OVARIAN CYST REMOVAL    . TONSILLECTOMY    . WISDOM TOOTH EXTRACTION      Past Obstetrical History:  OB History  Gravida Para Term Preterm AB Living  2 2 2  0 0 2  SAB TAB Ectopic Multiple Live Births  0 0 0 0 2    # Outcome Date GA Lbr Len/2nd Weight Sex Delivery Anes PTL Lv  2 Term 10/02/17 [redacted]w[redacted]d  9 lb 8.9 oz (4.335 kg) M CS-LTranv Spinal  LIV  1 Term 11/13/15 [redacted]w[redacted]d  6 lb 12 oz (3.062 kg) F CS-LTranv   LIV    Past Gynecological History: As per HPI. History of Pap Smear(s): Yes.   Last pap 2018, which was pap and hpv neg  Social History:  Social History   Socioeconomic History  . Marital status: Married    Spouse name: Not on file  . Number of children: Not on file  . Years of education: Not on file  . Highest education level: Not on file  Occupational History  . Not on file  Tobacco Use  . Smoking status: Never Smoker  . Smokeless tobacco: Never Used  Substance and Sexual Activity  . Alcohol use: No  . Drug use: No  . Sexual activity: Yes  Birth control/protection: I.U.D.  Other Topics Concern  . Not on file  Social History Narrative   Married.   2 children.   Works at [employer].   Social Determinants of Health   Financial Resource Strain:   . Difficulty of Paying Living Expenses: Not on file  Food Insecurity:   . Worried About Programme researcher, broadcasting/film/video in the Last Year: Not on file  . Ran Out of Food in the Last Year: Not on file  Transportation Needs:   . Lack of Transportation (Medical): Not on file  . Lack of Transportation (Non-Medical): Not on file  Physical Activity:   . Days of Exercise per Week: Not on file  . Minutes of Exercise per Session: Not on file  Stress:   . Feeling of Stress : Not on file  Social  Connections:   . Frequency of Communication with Friends and Family: Not on file  . Frequency of Social Gatherings with Friends and Family: Not on file  . Attends Religious Services: Not on file  . Active Member of Clubs or Organizations: Not on file  . Attends Banker Meetings: Not on file  . Marital Status: Not on file  Intimate Partner Violence:   . Fear of Current or Ex-Partner: Not on file  . Emotionally Abused: Not on file  . Physically Abused: Not on file  . Sexually Abused: Not on file    Family History:  Family History  Problem Relation Age of Onset  . Diabetes Mother   . Migraines Mother   . Diabetes Father   . Hypertension Father   . Hypertension Maternal Grandmother   . Diabetes Maternal Grandmother   . Diabetes Maternal Grandfather   . COPD Maternal Grandfather   . Diabetes Paternal Grandmother   . Hypertension Paternal Grandmother   . Diabetes Paternal Grandfather   . Hypertension Paternal Grandfather   . Heart attack Paternal Grandfather   . Asthma Sister     Medications Julia Kline had no medications administered during this visit. Current Outpatient Medications  Medication Sig Dispense Refill  . albuterol (VENTOLIN HFA) 108 (90 Base) MCG/ACT inhaler Inhale 2 puffs into the lungs every 6 (six) hours as needed for wheezing or shortness of breath. 8 g 0  . medroxyPROGESTERone (DEPO-PROVERA) 150 MG/ML injection Inject 1 mL (150 mg total) into the muscle every 3 (three) months. 1 mL 2   No current facility-administered medications for this visit.    Allergies Ancef [cefazolin], Fentanyl, Pseudoephedrine hcl, Shrimp [shellfish allergy], and Morphine and related   Physical Exam:  BP 128/87   Pulse (!) 103   Wt 255 lb (115.7 kg)   LMP  (LMP Unknown)   BMI 37.66 kg/m  Body mass index is 37.66 kg/m. General appearance: Well nourished, well developed female in no acute distress.  Cardiovascular: normal s1 and s2.  No murmurs, rubs or  gallops. Respiratory:  Clear to auscultation bilateral. Normal respiratory effort Abdomen: positive bowel sounds and no masses, hernias; diffusely non tender to palpation, non distended Neuro/Psych:  Normal mood and affect.  Skin:  Warm and dry.  Lymphatic:  No inguinal lymphadenopathy.   Pelvic exam: is not limited by body habitus EGBUS: within normal limits Vagina: within normal limits and with no blood or discharge in the vault Cervix: normal appearing cervix without tenderness, discharge or lesions.  Uterus:  nonenlarged and non tender Adnexa:  normal adnexa and no mass, fullness, tenderness Rectovaginal: deferred  Laboratory: UPT neg  Radiology: no new imaging  Assessment: pt stable  Plan:  1. Abnormal uterine bleeding (AUB) Likely due to the depo provera but has stopped. Will get CBC to see if anemic. She is unsure if they want more children (he wants more but she's unsure). I d/w her pregnancy with her weight and close to [facility]. I told her that since the bleeding stopped that maybe she just needed time to adjust. She is due for another shot in a month and I recommend that if she is bleed free or just some slight spotting when shes's due for another shot, which can be normal, that I'd recommend continuing with the depo. If aub comes back, pt told to call and we can send in PO pills to help slow the bleeding.  - POCT urine pregnancy - CBC  2. Obesity (BMI 30-39.9)  3. PCOS (polycystic ovarian syndrome)  4. Hypertension, unspecified type  5. History of cesarean delivery  6. Depo-Provera contraceptive status  Orders Placed This Encounter  Procedures  . CBC  . POCT urine pregnancy    RTC 37m for depo provera shot  Annada Bing, Montez Hageman MD Attending [facility])

## 2019-10-14 ENCOUNTER — Encounter: Payer: Self-pay | Admitting: Obstetrics and Gynecology

## 2019-10-14 DIAGNOSIS — D649 Anemia, unspecified: Secondary | ICD-10-CM | POA: Insufficient documentation

## 2019-10-14 HISTORY — DX: Anemia, unspecified: D64.9

## 2019-10-18 ENCOUNTER — Other Ambulatory Visit: Payer: Self-pay | Admitting: Internal Medicine

## 2019-10-28 ENCOUNTER — Ambulatory Visit (INDEPENDENT_AMBULATORY_CARE_PROVIDER_SITE_OTHER): Payer: Self-pay | Admitting: *Deleted

## 2019-10-28 ENCOUNTER — Ambulatory Visit: Payer: BC Managed Care – PPO

## 2019-10-28 ENCOUNTER — Other Ambulatory Visit: Payer: Self-pay

## 2019-10-28 VITALS — BP 132/88 | HR 108

## 2019-10-28 DIAGNOSIS — Z3042 Encounter for surveillance of injectable contraceptive: Secondary | ICD-10-CM

## 2019-10-28 MED ORDER — MEDROXYPROGESTERONE ACETATE 150 MG/ML IM SUSP
150.0000 mg | Freq: Once | INTRAMUSCULAR | Status: AC
  Administered 2019-10-28: 150 mg via INTRAMUSCULAR

## 2019-10-28 NOTE — Progress Notes (Signed)
Date last pap: .02/2017 Last Depo-Provera: 08/12/2019. Side Effects if any: none. Serum HCG indicated? NA. Depo-Provera 150 mg IM given by: Scheryl Marten, RN. Next appointment due May 26-Jun 9.

## 2019-10-28 NOTE — Progress Notes (Signed)
Attestation of Attending Supervision of clinical support staff: I agree with the care provided to this patient and was available for any consultation.  I have reviewed the RN's note and chart. I was available for consult and to see the patient if needed.   Rodriquez Thorner Niles Jakaya Jacobowitz, MD, MPH, ABFM Attending Physician [facility]  

## 2019-12-11 ENCOUNTER — Ambulatory Visit: Payer: Self-pay | Admitting: Internal Medicine

## 2019-12-11 ENCOUNTER — Other Ambulatory Visit: Payer: Self-pay

## 2019-12-11 ENCOUNTER — Encounter: Payer: Self-pay | Admitting: Internal Medicine

## 2019-12-11 VITALS — BP 124/86 | HR 96 | Temp 98.1°F | Wt 253.0 lb

## 2019-12-11 DIAGNOSIS — N898 Other specified noninflammatory disorders of vagina: Secondary | ICD-10-CM

## 2019-12-11 DIAGNOSIS — R7309 Other abnormal glucose: Secondary | ICD-10-CM

## 2019-12-11 LAB — POCT GLYCOSYLATED HEMOGLOBIN (HGB A1C): Hemoglobin A1C: 10.3 % — AB (ref 4.0–5.6)

## 2019-12-11 MED ORDER — FLUCONAZOLE 150 MG PO TABS: 150.0000 mg | ORAL_TABLET | Freq: Once | ORAL | 0 refills | Status: AC

## 2019-12-11 MED ORDER — METFORMIN HCL 500 MG PO TABS: ORAL_TABLET | ORAL | 0 refills | Status: DC

## 2019-12-11 NOTE — Progress Notes (Signed)
Subjective:    Patient ID: Julia Kline, female    DOB: [birth year], 34 y.o.   MRN: [id number]  HPI  Pt presents to the clinic today with c/o vaginal itching. This started 3-4 weeks ago. She denies pelvic pain, vaginal discharge, odor or abnormal bleeding. She denies changes in soaps, lotions or detergents. She has tried OTC Feminine Itch Relief without any relief. She is sexually active in a monogamous relationship.  Review of Systems  Past Medical History:  Diagnosis Date  . Acute conjunctivitis of right eye 07/10/2019  . Acute pain of right knee 08/06/2019  . Eczema   . Hernia cerebri ([facility])   . History of gestational diabetes 05/02/2017   Normal pp testing 10/2017  . History of severe pre-eclampsia 05/02/2017  . Hypertension   . Mastitis 06/18/2019  . Migraine   . PCOS (polycystic ovarian syndrome)   . Pregnancy induced hypertension     Current Outpatient Medications  Medication Sig Dispense Refill  . albuterol (VENTOLIN HFA) 108 (90 Base) MCG/ACT inhaler Inhale 2 puffs into the lungs every 6 (six) hours as needed for wheezing or shortness of breath. 8 g 0  . medroxyPROGESTERone (DEPO-PROVERA) 150 MG/ML injection Inject 1 mL (150 mg total) into the muscle every 3 (three) months. 1 mL 2   No current facility-administered medications for this visit.    Allergies  Allergen Reactions  . Ancef [Cefazolin] Hives and Swelling    Facial and neck swelling; Had reaction after receiving fentanyl and ancef in the OR  . Fentanyl Hives and Swelling    Facial and neck swelling; Had reaction after receiving fentanyl and ancef in the OR  . Pseudoephedrine Hcl Other (See Comments)  . Shrimp [Shellfish Allergy] Other (See Comments)    Swelling of throat  . Morphine And Related Rash    Family History  Problem Relation Age of Onset  . Diabetes Mother   . Migraines Mother   . Diabetes Father   . Hypertension Father   . Hypertension Maternal Grandmother   . Diabetes Maternal  Grandmother   . Diabetes Maternal Grandfather   . COPD Maternal Grandfather   . Diabetes Paternal Grandmother   . Hypertension Paternal Grandmother   . Diabetes Paternal Grandfather   . Hypertension Paternal Grandfather   . Heart attack Paternal Grandfather   . Asthma Sister     Social History   Socioeconomic History  . Marital status: Married    Spouse name: Not on file  . Number of children: Not on file  . Years of education: Not on file  . Highest education level: Not on file  Occupational History  . Not on file  Tobacco Use  . Smoking status: Never Smoker  . Smokeless tobacco: Never Used  Substance and Sexual Activity  . Alcohol use: No  . Drug use: No  . Sexual activity: Yes    Birth control/protection: I.U.D.  Other Topics Concern  . Not on file  Social History Narrative   Married.   2 children.   Works at [employer].   Social Determinants of Health   Financial Resource Strain:   . Difficulty of Paying Living Expenses:   Food Insecurity:   . Worried About Programme researcher, broadcasting/film/video in the Last Year:   . Barista in the Last Year:   Transportation Needs:   . Freight forwarder (Medical):   Marland Kitchen Lack of Transportation (Non-Medical):   Physical Activity:   . Days  of Exercise per Week:   . Minutes of Exercise per Session:   Stress:   . Feeling of Stress :   Social Connections:   . Frequency of Communication with Friends and Family:   . Frequency of Social Gatherings with Friends and Family:   . Attends Religious Services:   . Active Member of Clubs or Organizations:   . Attends Archivist Meetings:   Marland Kitchen Marital Status:   Intimate Partner Violence:   . Fear of Current or Ex-Partner:   . Emotionally Abused:   Marland Kitchen Physically Abused:   . Sexually Abused:      Constitutional: Denies fever, malaise, fatigue, headache or abrupt weight changes.  Respiratory: Denies difficulty breathing, shortness of breath, cough or sputum production.     Cardiovascular: Denies chest pain, chest tightness, palpitations or swelling in the hands or feet.  Gastrointestinal: Denies abdominal pain, bloating, constipation, diarrhea or blood in the stool.  GU: Pt reports vaginal itching. Denies urgency, frequency, pain with urination, burning sensation, blood in urine, odor or discharge.  No other specific complaints in a complete review of systems (except as listed in HPI above).     Objective:   Physical Exam   BP 124/86   Pulse 96   Temp 98.1 F (36.7 C) (Temporal)   Wt 253 lb (114.8 kg)   LMP 09/01/2019   SpO2 98%   BMI 37.36 kg/m    Wt Readings from Last 3 Encounters:  10/13/19 255 lb (115.7 kg)  09/28/19 260 lb (117.9 kg)  08/12/19 263 lb (119.3 kg)    General: Appears her stated age, obese, in NAD. Skin: Warm, dry and intact. No rashes, lesions or ulcerations noted. Cardiovascular: Normal rate..  Pulmonary/Chest: Normal effort and positive vesicular breath sounds. No respiratory distress.   Abdomen: Soft and nontender.  Pelvic:  Normal female anatomy.  Thin white vaginal discharge noted, no odor. Neurological: Alert and oriented.   BMET    Component Value Date/Time   NA 139 09/28/2019 1732   NA 145 (H) 10/09/2017 1500   K 4.1 09/28/2019 1732   CL 105 09/28/2019 1732   CO2 21 (L) 09/28/2019 1732   GLUCOSE 174 (H) 09/28/2019 1732   BUN 7 09/28/2019 1732   BUN 8 10/09/2017 1500   CREATININE 0.63 09/28/2019 1732   CALCIUM 9.2 09/28/2019 1732   GFRNONAA >60 09/28/2019 1732   GFRAA >60 09/28/2019 1732    Lipid Panel  No results found for: CHOL, TRIG, HDL, CHOLHDL, VLDL, LDLCALC  CBC    Component Value Date/Time   WBC 9.4 10/13/2019 1605   WBC 3.5 (L) 09/28/2019 1732   RBC 4.42 10/13/2019 1605   RBC 5.36 (H) 09/28/2019 1732   HGB 9.9 (L) 10/13/2019 1605   HGB 10.8 04/01/2017 0000   HCT 31.7 (L) 10/13/2019 1605   HCT 34 04/01/2017 0000   PLT 296 10/13/2019 1605   PLT 252 04/01/2017 0000   MCV 72 (L)  10/13/2019 1605   MCH 22.4 (L) 10/13/2019 1605   MCH 21.8 (L) 09/28/2019 1732   MCHC 31.2 (L) 10/13/2019 1605   MCHC 29.3 (L) 09/28/2019 1732   RDW 14.3 10/13/2019 1605   LYMPHSABS 1.5 09/28/2019 1732   MONOABS 0.4 09/28/2019 1732   EOSABS 0.0 09/28/2019 1732   BASOSABS 0.0 09/28/2019 1732    Hgb A1C Lab Results  Component Value Date   HGBA1C 5.9 (H) 07/19/2017           Assessment & Plan:  Vaginitis:  Will send off wet prep RX for Diflucan 150 mg PO x 1, repeat in 3 days  Will follow up after wet prep, return precautions discussed Nicki Reaper, NP This visit occurred during the SARS-CoV-2 public health emergency.  Safety protocols were in place, including screening questions prior to the visit, additional usage of staff PPE, and extensive cleaning of exam room while observing appropriate contact time as indicated for disinfecting solutions.

## 2019-12-11 NOTE — Addendum Note (Signed)
Addended by: Roena Malady on: 12/11/2019 05:07 PM   Modules accepted: Orders

## 2019-12-11 NOTE — Patient Instructions (Signed)

## 2019-12-11 NOTE — Addendum Note (Signed)
Addended by: Roena Malady on: 12/11/2019 04:30 PM   Modules accepted: Orders

## 2019-12-11 NOTE — Addendum Note (Signed)
Addended by: Roena Malady on: 12/11/2019 04:07 PM   Modules accepted: Orders

## 2019-12-12 LAB — WET PREP BY MOLECULAR PROBE
Candida species: NOT DETECTED
Gardnerella vaginalis: NOT DETECTED
MICRO NUMBER:: 10399636
SPECIMEN QUALITY:: ADEQUATE
Trichomonas vaginosis: NOT DETECTED

## 2019-12-14 ENCOUNTER — Encounter: Payer: Self-pay | Admitting: Internal Medicine

## 2019-12-17 NOTE — Telephone Encounter (Signed)
Patient has appointment schedule on 01/11/2020

## 2019-12-21 ENCOUNTER — Other Ambulatory Visit: Payer: Self-pay

## 2019-12-21 ENCOUNTER — Encounter: Payer: Self-pay | Admitting: Primary Care

## 2019-12-21 ENCOUNTER — Ambulatory Visit: Payer: Self-pay | Admitting: Primary Care

## 2019-12-21 VITALS — BP 120/78 | HR 114 | Temp 96.3°F | Ht 69.0 in | Wt 251.0 lb

## 2019-12-21 DIAGNOSIS — E119 Type 2 diabetes mellitus without complications: Secondary | ICD-10-CM

## 2019-12-21 DIAGNOSIS — Z8616 Personal history of COVID-19: Secondary | ICD-10-CM

## 2019-12-21 DIAGNOSIS — E1165 Type 2 diabetes mellitus with hyperglycemia: Secondary | ICD-10-CM | POA: Insufficient documentation

## 2019-12-21 DIAGNOSIS — R0789 Other chest pain: Secondary | ICD-10-CM

## 2019-12-21 HISTORY — DX: Personal history of COVID-19: Z86.16

## 2019-12-21 LAB — COMPREHENSIVE METABOLIC PANEL
ALT: 46 U/L — ABNORMAL HIGH (ref 0–35)
AST: 26 U/L (ref 0–37)
Albumin: 4.2 g/dL (ref 3.5–5.2)
Alkaline Phosphatase: 63 U/L (ref 39–117)
BUN: 10 mg/dL (ref 6–23)
CO2: 27 mEq/L (ref 19–32)
Calcium: 9.2 mg/dL (ref 8.4–10.5)
Chloride: 102 mEq/L (ref 96–112)
Creatinine, Ser: 0.7 mg/dL (ref 0.40–1.20)
GFR: 115.78 mL/min (ref >60.00)
Glucose, Bld: 154 mg/dL — ABNORMAL HIGH (ref 70–99)
Potassium: 3.8 mEq/L (ref 3.5–5.1)
Sodium: 136 mEq/L (ref 135–145)
Total Bilirubin: 0.3 mg/dL (ref 0.2–1.2)
Total Protein: 7.3 g/dL (ref 6.0–8.3)

## 2019-12-21 LAB — LDL CHOLESTEROL, DIRECT: Direct LDL: 61 mg/dL

## 2019-12-21 LAB — LIPID PANEL
Cholesterol: 109 mg/dL (ref 0–200)
HDL: 28.1 mg/dL — ABNORMAL LOW (ref >39.00)
NonHDL: 80.87
Total CHOL/HDL Ratio: 4
Triglycerides: 256 mg/dL — ABNORMAL HIGH (ref 0.0–149.0)
VLDL: 51.2 mg/dL — ABNORMAL HIGH (ref 0.0–40.0)

## 2019-12-21 LAB — MICROALBUMIN / CREATININE URINE RATIO
Creatinine,U: 224.8 mg/dL
Microalb Creat Ratio: 7.1 mg/g (ref 0.0–30.0)
Microalb, Ur: 16 mg/dL — ABNORMAL HIGH (ref 0.0–1.9)

## 2019-12-21 NOTE — Progress Notes (Signed)
Subjective:    Patient ID: Julia Kline, female    DOB: 1986/03/02, 34 y.o.   MRN: [id number]  HPI  This visit occurred during the SARS-CoV-2 public health emergency.  Safety protocols were in place, including screening questions prior to the visit, additional usage of staff PPE, and extensive cleaning of exam room while observing appropriate contact time as indicated for disinfecting solutions.   Ms. Boschee is a 34 year old female with a history of migraines, gestational diabetes, hypertension, obesity, abnormal uterine bleeding, type 2 diabetes (new diagnosis) who presents today for follow up.  Recently evaluated by Nicki Reaper for complaints of vaginal itching. A1C checked which revealed a level of 10.3. She was initiated on metformin 500 mg BID on 12/11/19.   Since her last visit she is compliant to her metformin and is taking 1 tablet twice daily as prescribed. She's had mild diarrhea.   She's having symptoms of fatigue, polydipsia, chest pressure substernal, lower extremity cramping. She has had gradual vision changes over the last 1-2 years.  She denies numbness/tingling, polyuria. She had Covid-19 in February 2021, has had chest pressure intermittently since.   She has worked on improving her diet since her diagnosis of diabetes and is eating baked lean protein, salads and less fast food and pasta.   She is checking her glucose three times daily and is getting readings of:  AM fasting: 240-250 1 hour after meals: high 200's  BP Readings from Last 3 Encounters:  12/21/19 120/78  12/11/19 124/86  10/28/19 132/88     Review of Systems  Respiratory: Negative for shortness of breath.   Cardiovascular: Negative for palpitations and leg swelling.       Chest pressure  Neurological: Negative for dizziness and numbness.       Past Medical History:  Diagnosis Date  . Acute conjunctivitis of right eye 07/10/2019  . Acute pain of right knee 08/06/2019  . Eczema   .  Hernia cerebri ([facility])   . History of gestational diabetes 05/02/2017   Normal pp testing 10/2017  . History of severe pre-eclampsia 05/02/2017  . Hypertension   . Mastitis 06/18/2019  . Migraine   . PCOS (polycystic ovarian syndrome)   . Pregnancy induced hypertension      Social History   Socioeconomic History  . Marital status: Married    Spouse name: Not on file  . Number of children: Not on file  . Years of education: Not on file  . Highest education level: Not on file  Occupational History  . Not on file  Tobacco Use  . Smoking status: Never Smoker  . Smokeless tobacco: Never Used  Substance and Sexual Activity  . Alcohol use: No  . Drug use: No  . Sexual activity: Yes    Birth control/protection: I.U.D.  Other Topics Concern  . Not on file  Social History Narrative   Married.   2 children.   Works at [employer].   Social Determinants of Health   Financial Resource Strain:   . Difficulty of Paying Living Expenses:   Food Insecurity:   . Worried About Programme researcher, broadcasting/film/video in the Last Year:   . Barista in the Last Year:   Transportation Needs:   . Freight forwarder (Medical):   Marland Kitchen Lack of Transportation (Non-Medical):   Physical Activity:   . Days of Exercise per Week:   . Minutes of Exercise per Session:   Stress:   .  Feeling of Stress :   Social Connections:   . Frequency of Communication with Friends and Family:   . Frequency of Social Gatherings with Friends and Family:   . Attends Religious Services:   . Active Member of Clubs or Organizations:   . Attends Archivist Meetings:   Marland Kitchen Marital Status:   Intimate Partner Violence:   . Fear of Current or Ex-Partner:   . Emotionally Abused:   Marland Kitchen Physically Abused:   . Sexually Abused:     Past Surgical History:  Procedure Laterality Date  . CESAREAN SECTION  2017   Procedure: CESAREAN DELIVERY ONLY; Surgeon: Lina Sar, MD; Location: [facility]; Service:  Obstetrics  . CESAREAN SECTION N/A 10/02/2017   Procedure: CESAREAN SECTION;  Surgeon: Jonnie Kind, MD;  Location: [place];  Service: Obstetrics;  Laterality: N/A;  . LAPAROSCOPIC OOPHERECTOMY Left   . LEEP    . OVARIAN CYST REMOVAL    . TONSILLECTOMY    . WISDOM TOOTH EXTRACTION      Family History  Problem Relation Age of Onset  . Diabetes Mother   . Migraines Mother   . Diabetes Father   . Hypertension Father   . Hypertension Maternal Grandmother   . Diabetes Maternal Grandmother   . Diabetes Maternal Grandfather   . COPD Maternal Grandfather   . Diabetes Paternal Grandmother   . Hypertension Paternal Grandmother   . Diabetes Paternal Grandfather   . Hypertension Paternal Grandfather   . Heart attack Paternal Grandfather   . Asthma Sister     Allergies  Allergen Reactions  . Ancef [Cefazolin] Hives and Swelling    Facial and neck swelling; Had reaction after receiving fentanyl and ancef in the OR  . Fentanyl Hives and Swelling    Facial and neck swelling; Had reaction after receiving fentanyl and ancef in the OR  . Pseudoephedrine Hcl Other (See Comments)  . Shrimp [Shellfish Allergy] Other (See Comments)    Swelling of throat  . Morphine And Related Rash    Current Outpatient Medications on File Prior to Visit  Medication Sig Dispense Refill  . albuterol (VENTOLIN HFA) 108 (90 Base) MCG/ACT inhaler Inhale 2 puffs into the lungs every 6 (six) hours as needed for wheezing or shortness of breath. 8 g 0  . medroxyPROGESTERone (DEPO-PROVERA) 150 MG/ML injection Inject 1 mL (150 mg total) into the muscle every 3 (three) months. 1 mL 2  . metFORMIN (GLUCOPHAGE) 500 MG tablet 1 tablet at bedtime x 1 week then 1 tablet 2 times daily 60 tablet 0   No current facility-administered medications on file prior to visit.    BP 120/78   Pulse (!) 114   Temp (!) 96.3 F (35.7 C) (Temporal)   Ht 5\' 9"  (1.753 m)   Wt 251 lb (113.9 kg)   LMP 09/27/2019   SpO2 98%    BMI 37.07 kg/m    Objective:   Physical Exam  Constitutional: She appears well-nourished.  Cardiovascular: Normal rate and regular rhythm.  Respiratory: Effort normal and breath sounds normal.  Musculoskeletal:     Cervical back: Neck supple.  Skin: Skin is warm and dry.  Psychiatric: She has a normal mood and affect.           Assessment & Plan:

## 2019-12-21 NOTE — Assessment & Plan Note (Signed)
New diagnosis as of two weeks ago, A1C of 10.3. Compliant to metformin and is overall tolerating well. Plan would be to increase metformin to 1000 mg BID, but will wait another few weeks.  Foot exam today. Discussed eye exams. Urine microalbumin pending. Work on diet and exercise.  Follow up in 3 months.

## 2019-12-21 NOTE — Assessment & Plan Note (Signed)
Since February 2021. Checking d-dimer for chest pressure symptoms given prior Covid-19 diagnosis.

## 2019-12-21 NOTE — Patient Instructions (Signed)
Stop by the lab prior to leaving today. I will notify you of your results once received.   Continue taking metformin 500 mg twice daily for diabetes. Please update me once you get close to finishing your current dose.  Start exercising. You should be getting 150 minutes of moderate intensity exercise weekly.  It is important that you improve your diet. Please limit carbohydrates in the form of white bread, rice, pasta, sweets, fast food, fried food, sugary drinks, etc. Increase your consumption of fresh fruits and vegetables, whole grains, lean protein.  Ensure you are consuming 64 ounces of water daily.  Please schedule a follow up appointment in 3 months.   Diabetes Mellitus and Nutrition, Adult When you have diabetes (diabetes mellitus), it is very important to have healthy eating habits because your blood sugar (glucose) levels are greatly affected by what you eat and drink. Eating healthy foods in the appropriate amounts, at about the same times every day, can help you:  Control your blood glucose.  Lower your risk of heart disease.  Improve your blood pressure.  Reach or maintain a healthy weight. Every person with diabetes is different, and each person has different needs for a meal plan. Your health care provider may recommend that you work with a diet and nutrition specialist (dietitian) to make a meal plan that is best for you. Your meal plan may vary depending on factors such as:  The calories you need.  The medicines you take.  Your weight.  Your blood glucose, blood pressure, and cholesterol levels.  Your activity level.  Other health conditions you have, such as heart or kidney disease. How do carbohydrates affect me? Carbohydrates, also called carbs, affect your blood glucose level more than any other type of food. Eating carbs naturally raises the amount of glucose in your blood. Carb counting is a method for keeping track of how many carbs you eat. Counting carbs  is important to keep your blood glucose at a healthy level, especially if you use insulin or take certain oral diabetes medicines. It is important to know how many carbs you can safely have in each meal. This is different for every person. Your dietitian can help you calculate how many carbs you should have at each meal and for each snack. Foods that contain carbs include:  Bread, cereal, rice, pasta, and crackers.  Potatoes and corn.  Peas, beans, and lentils.  Milk and yogurt.  Fruit and juice.  Desserts, such as cakes, cookies, ice cream, and candy. How does alcohol affect me? Alcohol can cause a sudden decrease in blood glucose (hypoglycemia), especially if you use insulin or take certain oral diabetes medicines. Hypoglycemia can be a life-threatening condition. Symptoms of hypoglycemia (sleepiness, dizziness, and confusion) are similar to symptoms of having too much alcohol. If your health care provider says that alcohol is safe for you, follow these guidelines:  Limit alcohol intake to no more than 1 drink per day for nonpregnant women and 2 drinks per day for men. One drink equals 12 oz of beer, 5 oz of wine, or 1 oz of hard liquor.  Do not drink on an empty stomach.  Keep yourself hydrated with water, diet soda, or unsweetened iced tea.  Keep in mind that regular soda, juice, and other mixers may contain a lot of sugar and must be counted as carbs. What are tips for following this plan?  Reading food labels  Start by checking the serving size on the "Nutrition Facts" label  of packaged foods and drinks. The amount of calories, carbs, fats, and other nutrients listed on the label is based on one serving of the item. Many items contain more than one serving per package.  Check the total grams (g) of carbs in one serving. You can calculate the number of servings of carbs in one serving by dividing the total carbs by 15. For example, if a food has 30 g of total carbs, it would be  equal to 2 servings of carbs.  Check the number of grams (g) of saturated and trans fats in one serving. Choose foods that have low or no amount of these fats.  Check the number of milligrams (mg) of salt (sodium) in one serving. Most people should limit total sodium intake to less than 2,300 mg per day.  Always check the nutrition information of foods labeled as "low-fat" or "nonfat". These foods may be higher in added sugar or refined carbs and should be avoided.  Talk to your dietitian to identify your daily goals for nutrients listed on the label. Shopping  Avoid buying canned, premade, or processed foods. These foods tend to be high in fat, sodium, and added sugar.  Shop around the outside edge of the grocery store. This includes fresh fruits and vegetables, bulk grains, fresh meats, and fresh dairy. Cooking  Use low-heat cooking methods, such as baking, instead of high-heat cooking methods like deep frying.  Cook using healthy oils, such as olive, canola, or sunflower oil.  Avoid cooking with butter, cream, or high-fat meats. Meal planning  Eat meals and snacks regularly, preferably at the same times every day. Avoid going long periods of time without eating.  Eat foods high in fiber, such as fresh fruits, vegetables, beans, and whole grains. Talk to your dietitian about how many servings of carbs you can eat at each meal.  Eat 4-6 ounces (oz) of lean protein each day, such as lean meat, chicken, fish, eggs, or tofu. One oz of lean protein is equal to: ? 1 oz of meat, chicken, or fish. ? 1 egg. ?  cup of tofu.  Eat some foods each day that contain healthy fats, such as avocado, nuts, seeds, and fish. Lifestyle  Check your blood glucose regularly.  Exercise regularly as told by your health care provider. This may include: ? 150 minutes of moderate-intensity or vigorous-intensity exercise each week. This could be brisk walking, biking, or water aerobics. ? Stretching and  doing strength exercises, such as yoga or weightlifting, at least 2 times a week.  Take medicines as told by your health care provider.  Do not use any products that contain nicotine or tobacco, such as cigarettes and e-cigarettes. If you need help quitting, ask your health care provider.  Work with a Social worker or diabetes educator to identify strategies to manage stress and any emotional and social challenges. Questions to ask a health care provider  Do I need to meet with a diabetes educator?  Do I need to meet with a dietitian?  What number can I call if I have questions?  When are the best times to check my blood glucose? Where to find more information:  American Diabetes Association: diabetes.org  Academy of Nutrition and Dietetics: www.eatright.[employer] of Diabetes and Digestive and Kidney Diseases (NIH): [employer] Summary  A healthy meal plan will help you control your blood glucose and maintain a healthy lifestyle.  Working with a diet and nutrition specialist (dietitian) can help you make a  meal plan that is best for you.  Keep in mind that carbohydrates (carbs) and alcohol have immediate effects on your blood glucose levels. It is important to count carbs and to use alcohol carefully. This information is not intended to replace advice given to you by your health care provider. Make sure you discuss any questions you have with your health care provider. Document Revised: 07/19/2017 Document Reviewed: 09/10/2016 Elsevier Patient Education  2020 [employer].

## 2019-12-22 LAB — D-DIMER, QUANTITATIVE: D-Dimer, Quant: 0.46 mcg/mL FEU (ref <0.50)

## 2020-01-02 ENCOUNTER — Other Ambulatory Visit: Payer: Self-pay | Admitting: Internal Medicine

## 2020-01-02 DIAGNOSIS — E119 Type 2 diabetes mellitus without complications: Secondary | ICD-10-CM

## 2020-01-04 NOTE — Telephone Encounter (Signed)
Last OV note per you, please advise  Plan would be to increase metformin to 1000 mg BID, but will wait another few weeks.

## 2020-01-04 NOTE — Telephone Encounter (Signed)
How's she doing on Metformin 500 mg BID? Any issues? If not then I'd like to change to 1000 mg BID. Let me know.

## 2020-01-05 NOTE — Telephone Encounter (Signed)
Message left for patient to return my call.  

## 2020-01-06 NOTE — Telephone Encounter (Signed)
Noted, prescription for Metformin 1000 mg twice daily sent to pharmacy.

## 2020-01-06 NOTE — Telephone Encounter (Signed)
Spoken to patient and she stated that she is doing okay on the 500 mg BID. Patient stated that is fine to change to 1000 mg BID.

## 2020-01-07 MED ORDER — ACCU-CHEK FASTCLIX LANCETS MISC: 5 refills | Status: DC

## 2020-01-11 ENCOUNTER — Ambulatory Visit: Payer: Self-pay | Admitting: Primary Care

## 2020-01-13 ENCOUNTER — Ambulatory Visit: Payer: BLUE CROSS/BLUE SHIELD

## 2020-01-14 DIAGNOSIS — E119 Type 2 diabetes mellitus without complications: Secondary | ICD-10-CM

## 2020-01-14 MED ORDER — METFORMIN HCL ER 500 MG PO TB24: ORAL_TABLET | ORAL | 0 refills | Status: DC

## 2020-01-15 ENCOUNTER — Ambulatory Visit: Payer: Self-pay | Admitting: Primary Care

## 2020-01-21 ENCOUNTER — Other Ambulatory Visit: Payer: Self-pay

## 2020-01-21 ENCOUNTER — Ambulatory Visit (INDEPENDENT_AMBULATORY_CARE_PROVIDER_SITE_OTHER): Payer: 59 | Admitting: *Deleted

## 2020-01-21 VITALS — BP 136/84 | HR 111

## 2020-01-21 DIAGNOSIS — Z3042 Encounter for surveillance of injectable contraceptive: Secondary | ICD-10-CM | POA: Diagnosis not present

## 2020-01-21 MED ORDER — MEDROXYPROGESTERONE ACETATE 150 MG/ML IM SUSP
150.0000 mg | Freq: Once | INTRAMUSCULAR | Status: AC
  Administered 2020-01-21: 150 mg via INTRAMUSCULAR

## 2020-01-21 NOTE — Progress Notes (Signed)
Patient was assessed and managed by nursing staff during this encounter. I have reviewed the chart and agree with the documentation and plan. I have also made any necessary editorial changes.  [place] Bing, MD 01/21/2020 3:54 PM

## 2020-01-21 NOTE — Progress Notes (Signed)
Date last pap: 02/2017 Last Depo-Provera: 10/28/2019. Side Effects if any: None. Serum HCG indicated? NA. Depo-Provera 150 mg IM given by: Scheryl Marten, RN. Next appointment due August 19-Sept 2nd.

## 2020-02-10 ENCOUNTER — Other Ambulatory Visit: Payer: Self-pay | Admitting: Primary Care

## 2020-02-10 DIAGNOSIS — E119 Type 2 diabetes mellitus without complications: Secondary | ICD-10-CM

## 2020-02-16 ENCOUNTER — Encounter: Payer: Self-pay | Admitting: Family Medicine

## 2020-02-16 ENCOUNTER — Other Ambulatory Visit: Payer: Self-pay

## 2020-02-16 ENCOUNTER — Ambulatory Visit: Payer: 59 | Admitting: Family Medicine

## 2020-02-16 VITALS — BP 110/80 | HR 103 | Temp 98.1°F | Wt 256.0 lb

## 2020-02-16 DIAGNOSIS — M7542 Impingement syndrome of left shoulder: Secondary | ICD-10-CM | POA: Diagnosis not present

## 2020-02-16 HISTORY — DX: Impingement syndrome of left shoulder: M75.42

## 2020-02-16 MED ORDER — MELOXICAM 7.5 MG PO TABS: 7.5000 mg | ORAL_TABLET | Freq: Every day | ORAL | 0 refills | Status: DC

## 2020-02-16 NOTE — Progress Notes (Signed)
   Subjective:     Julia Kline is a 34 y.o. female presenting for Arm Pain (Left arm pain from shoulder to elbow x 1 day )     HPI  #Arm pain - started yesterday  - shoulder and travels to the elbow - started yesterday - no injury - anterior shoulder location - started like a fullness/heaviness - now with tightness and achy feeling - no hx of shoulder pain - the whole upper - worse with arm movement -   Review of Systems   Social History   Tobacco Use  Smoking Status Never Smoker  Smokeless Tobacco Never Used        Objective:    BP Readings from Last 3 Encounters:  02/16/20 110/80  01/21/20 136/84  12/21/19 120/78   Wt Readings from Last 3 Encounters:  02/16/20 256 lb (116.1 kg)  12/21/19 251 lb (113.9 kg)  12/11/19 253 lb (114.8 kg)    BP 110/80   Pulse (!) 103   Temp 98.1 F (36.7 C) (Temporal)   Wt 256 lb (116.1 kg)   SpO2 98%   BMI 37.80 kg/m    Physical Exam Constitutional:      General: She is not in acute distress.    Appearance: She is well-developed. She is obese. She is not diaphoretic.  HENT:     Right Ear: External ear normal.     Left Ear: External ear normal.  Eyes:     Conjunctiva/sclera: Conjunctivae normal.  Cardiovascular:     Rate and Rhythm: Normal rate.  Pulmonary:     Effort: Pulmonary effort is normal.  Musculoskeletal:     Cervical back: Neck supple.     Comments: Left shoulder: Inspection: no drop off Palpation: TTP along the biceps tendon anterior shoulder area, otherwise no TTP ROM: limited shoulder abduction 2/2 to pain. Pain with flexion but intact, good rotation Strength: normal biceps/tricep Hawkins and neers both with significant pain  Skin:    General: Skin is warm and dry.     Capillary Refill: Capillary refill takes less than 2 seconds.  Neurological:     Mental Status: She is alert. Mental status is at baseline.  Psychiatric:        Mood and Affect: Mood normal.        Behavior:  Behavior normal.           Assessment & Plan:   Problem List Items Addressed This Visit      Other   Impingement syndrome of left shoulder - Primary    Given lack of injury and pain with impingement testing suspect this is the most likely diagnosis. Recommended rest, NSAIDs, ice. Exercises and basic ROM encouraged as tolerated. If no improved discussed could see Dr. Patsy Lager for evaluation and possible shot vs PT. She will call if symptoms not improving.       Relevant Medications   meloxicam (MOBIC) 7.5 MG tablet       Return in about 2 weeks (around 03/01/2020).  Lynnda Child, MD  This visit occurred during the SARS-CoV-2 public health emergency.  Safety protocols were in place, including screening questions prior to the visit, additional usage of staff PPE, and extensive cleaning of exam room while observing appropriate contact time as indicated for disinfecting solutions.

## 2020-02-16 NOTE — Patient Instructions (Addendum)
Impingement syndrom - Meloxicam daily - can take tylenol if worsening pain - ice 15 minutes as many times a day as able - try gentle range of motion - if no improvement in 2 weeks -- call or mychart for referral to either PT or sports medicine   Shoulder Impingement Syndrome Rehab Ask your health care provider which exercises are safe for you. Do exercises exactly as told by your health care provider and adjust them as directed. It is normal to feel mild stretching, pulling, tightness, or discomfort as you do these exercises. Stop right away if you feel sudden pain or your pain gets worse. Do not begin these exercises until told by your health care provider. Stretching and range-of-motion exercise This exercise warms up your muscles and joints and improves the movement and flexibility of your shoulder. This exercise also helps to relieve pain and stiffness. Passive horizontal adduction In passive adduction, you use your other hand to move the injured arm toward your body. The injured arm does not move on its own. In this movement, your arm is moved across your body in the horizontal plane (horizontal adduction). 1. Sit or stand and pull your left / right elbow across your chest, toward your other shoulder. Stop when you feel a gentle stretch in the back of your shoulder and upper arm. ? Keep your arm at shoulder height. ? Keep your arm as close to your body as you comfortably can. 2. Hold for __________ seconds. 3. Slowly return to the starting position. Repeat __________ times. Complete this exercise __________ times a day. Strengthening exercises These exercises build strength and endurance in your shoulder. Endurance is the ability to use your muscles for a long time, even after they get tired. External rotation, isometric This is an exercise in which you press the back of your wrist against a door frame without moving your shoulder joint (isometric). 1. Stand or sit in a doorway, facing  the door frame. 2. Bend your left / right elbow and place the back of your wrist against the door frame. Only the back of your wrist should be touching the frame. Keep your upper arm at your side. 3. Gently press your wrist against the door frame, as if you are trying to push your arm away from your abdomen (external rotation). Press as hard as you are able without pain. ? Avoid shrugging your shoulder while you press your wrist against the door frame. Keep your shoulder blade tucked down toward the middle of your back. 4. Hold for __________ seconds. 5. Slowly release the tension, and relax your muscles completely before you repeat the exercise. Repeat __________ times. Complete this exercise __________ times a day. Internal rotation, isometric This is an exercise in which you press your palm against a door frame without moving your shoulder joint (isometric). 1. Stand or sit in a doorway, facing the door frame. 2. Bend your left / right elbow and place the palm of your hand against the door frame. Only your palm should be touching the frame. Keep your upper arm at your side. 3. Gently press your hand against the door frame, as if you are trying to push your arm toward your abdomen (internal rotation). Press as hard as you are able without pain. ? Avoid shrugging your shoulder while you press your hand against the door frame. Keep your shoulder blade tucked down toward the middle of your back. 4. Hold for __________ seconds. 5. Slowly release the tension, and relax your  muscles completely before you repeat the exercise. Repeat __________ times. Complete this exercise __________ times a day. Scapular protraction, supine  1. Lie on your back on a firm surface (supine position). Hold a __________ weight in your left / right hand. 2. Raise your left / right arm straight into the air so your hand is directly above your shoulder joint. 3. Push the weight into the air so your shoulder (scapula) lifts  off the surface that you are lying on. The scapula will push up or forward (protraction). Do not move your head, neck, or back. 4. Hold for __________ seconds. 5. Slowly return to the starting position. Let your muscles relax completely before you repeat this exercise. Repeat __________ times. Complete this exercise __________ times a day. Scapular retraction  1. Sit in a stable chair without armrests, or stand up. 2. Secure an exercise band to a stable object in front of you so the band is at shoulder height. 3. Hold one end of the exercise band in each hand. Your palms should face down. 4. Squeeze your shoulder blades together (retraction) and move your elbows slightly behind you. Do not shrug your shoulders upward while you do this. 5. Hold for __________ seconds. 6. Slowly return to the starting position. Repeat __________ times. Complete this exercise __________ times a day. Shoulder extension  1. Sit in a stable chair without armrests, or stand up. 2. Secure an exercise band to a stable object in front of you so the band is above shoulder height. 3. Hold one end of the exercise band in each hand. 4. Straighten your elbows and lift your hands up to shoulder height. 5. Squeeze your shoulder blades together and pull your hands down to the sides of your thighs (extension). Stop when your hands are straight down by your sides. Do not let your hands go behind your body. 6. Hold for __________ seconds. 7. Slowly return to the starting position. Repeat __________ times. Complete this exercise __________ times a day. This information is not intended to replace advice given to you by your health care provider. Make sure you discuss any questions you have with your health care provider. Document Revised: 11/28/2018 Document Reviewed: 09/01/2018 Elsevier Patient Education  2020 [employer].

## 2020-02-16 NOTE — Assessment & Plan Note (Signed)
Given lack of injury and pain with impingement testing suspect this is the most likely diagnosis. Recommended rest, NSAIDs, ice. Exercises and basic ROM encouraged as tolerated. If no improved discussed could see Dr. Patsy Lager for evaluation and possible shot vs PT. She will call if symptoms not improving.

## 2020-03-15 ENCOUNTER — Encounter: Payer: Self-pay | Admitting: Obstetrics and Gynecology

## 2020-03-15 ENCOUNTER — Telehealth (INDEPENDENT_AMBULATORY_CARE_PROVIDER_SITE_OTHER): Payer: 59 | Admitting: Obstetrics and Gynecology

## 2020-03-15 ENCOUNTER — Other Ambulatory Visit: Payer: Self-pay

## 2020-03-15 DIAGNOSIS — R6882 Decreased libido: Secondary | ICD-10-CM | POA: Diagnosis not present

## 2020-03-15 DIAGNOSIS — E119 Type 2 diabetes mellitus without complications: Secondary | ICD-10-CM | POA: Diagnosis not present

## 2020-03-15 NOTE — Progress Notes (Signed)
Recently diagnosed with diabetes, started on metformin  Pt has had decreased libido since beginning of the year, not sure if its related to the Depo or the metformin   I connected with  [name] on 03/15/20 at  1:45 PM EDT by telephone and verified that I am speaking with the correct person using two identifiers.   I discussed the limitations, risks, security and privacy concerns of performing an evaluation and management service by telephone and the availability of in person appointments. I also discussed with the patient that there may be a patient responsible charge related to this service. The patient expressed understanding and agreed to proceed.  Scheryl Marten, RN 03/15/2020  1:47 PM

## 2020-03-15 NOTE — Progress Notes (Signed)
GYNECOLOGY VIRTUAL VISIT ENCOUNTER NOTE  Provider location: [facility] for [facility]   I connected with Julia Kline on 03/15/20 at  1:45 PM EDT by MyChart Video Encounter at home and verified that I am speaking with the correct person using two identifiers.   I discussed the limitations, risks, security and privacy concerns of performing an evaluation and management service virtually and the availability of in person appointments. I also discussed with the patient that there may be a patient responsible charge related to this service. The patient expressed understanding and agreed to proceed.   History:  Julia Kline is a 34 y.o. [id number] female being evaluated today for decreased libido. Patient reports decrease desire for the past year. She enquires whether it is related to depo-provera or metformin which are new to her. Patient reports resolution of breakthrough vaginal bleeding with depo-provera. She denies any abnormal vaginal discharge, bleeding, pelvic pain or other concerns.  Her last A1C in April was 10. Patient scheduled to see PCP next week. She admits to not being as compliant with diabetes care      Past Medical History:  Diagnosis Date  . Acute conjunctivitis of right eye 07/10/2019  . Acute pain of right knee 08/06/2019  . Diabetes mellitus without complication (HCC)   . Eczema   . Hernia cerebri (HCC)   . History of gestational diabetes 05/02/2017   Normal pp testing 10/2017  . History of severe pre-eclampsia 05/02/2017  . Hypertension   . Mastitis 06/18/2019  . Migraine   . PCOS (polycystic ovarian syndrome)   . Pregnancy induced hypertension    Past Surgical History:  Procedure Laterality Date  . CESAREAN SECTION  2017   Procedure: CESAREAN DELIVERY ONLY; Surgeon: Marian Sorrow, MD; Location: [facility]; Service: Obstetrics  . CESAREAN SECTION N/A 10/02/2017   Procedure: CESAREAN SECTION;  Surgeon: Tilda Burrow, MD;  Location: [facility];  Service: Obstetrics;  Laterality: N/A;  . LAPAROSCOPIC OOPHERECTOMY Left   . LEEP    . OVARIAN CYST REMOVAL    . TONSILLECTOMY    . WISDOM TOOTH EXTRACTION     The following portions of the patient's history were reviewed and updated as appropriate: allergies, current medications, past family history, past medical history, past social history, past surgical history and problem list.    Review of Systems:  Pertinent items noted in HPI and remainder of comprehensive ROS otherwise negative.  Physical Exam:   General:  Alert, oriented and cooperative. Patient appears to be in no acute distress.  Mental Status: Normal mood and affect. Normal behavior. Normal judgment and thought content.   Respiratory: Normal respiratory effort, no problems with respiration noted  Rest of physical exam deferred due to type of encounter  Labs and Imaging No results found for this or any previous visit (from the past 336 hour(s)). No results found.     Assessment and Plan:     1. Type 2 diabetes mellitus without complication, without long-term current use of insulin (HCC) Discussed importance of compliance with diabetic diet and medication Patient referred to meet with diabetic educator - Referral to Nutrition and Diabetes Services  2. Decreased libido Both metformin and depo-provera may cause decrease libido in a select few patients. Patient was advised to optimize diabetes management to see if her libido would increase       I discussed the assessment and treatment plan with the patient. The patient was provided an  opportunity to ask questions and all were answered. The patient agreed with the plan and demonstrated an understanding of the instructions.   The patient was advised to call back or seek an in-person evaluation/go to the ED if the symptoms worsen or if the condition fails to improve as anticipated.  I provided 15 minutes of face-to-face time  during this encounter.   Catalina Antigua, MD Center for [employer], [facility]

## 2020-03-22 ENCOUNTER — Ambulatory Visit: Payer: 59 | Admitting: Primary Care

## 2020-03-22 ENCOUNTER — Other Ambulatory Visit: Payer: Self-pay

## 2020-03-22 ENCOUNTER — Encounter: Payer: Self-pay | Admitting: Primary Care

## 2020-03-22 VITALS — BP 130/84 | HR 93 | Temp 96.2°F | Ht 69.0 in | Wt 251.4 lb

## 2020-03-22 DIAGNOSIS — E119 Type 2 diabetes mellitus without complications: Secondary | ICD-10-CM

## 2020-03-22 LAB — POCT GLYCOSYLATED HEMOGLOBIN (HGB A1C): Hemoglobin A1C: 9.2 % — AB (ref 4.0–5.6)

## 2020-03-22 MED ORDER — TRULICITY 0.75 MG/0.5ML ~~LOC~~ SOAJ: 0.7500 mg | SUBCUTANEOUS | 0 refills | Status: DC

## 2020-03-22 NOTE — Progress Notes (Signed)
Subjective:    Patient ID: Julia Kline, female    DOB: 12/11/1985, 34 y.o.   MRN: [id number]  HPI  This visit occurred during the SARS-CoV-2 public health emergency.  Safety protocols were in place, including screening questions prior to the visit, additional usage of staff PPE, and extensive cleaning of exam room while observing appropriate contact time as indicated for disinfecting solutions.   Julia Kline is a 34 year old female with a history of type 2 diabetes, hypertension, migraines, anemia, Covid-19 infection who presents today for follow up of diabetes.  Current medications include: Metformin XR 1000 mg  She is checking her blood glucose 0 times daily on average. Yesterday her morning fasting glucose was 250. She is not checking regularly.   Last A1C: 10.3 in April 2021, 9.2 today Last Eye Exam: Due in Fall 2021 Last Foot Exam: UTD Pneumonia Vaccination: Never completed.  ACE/ARB: None. Urine micoralbumin negative in May 2021. Statin: None. LDL of 61 in May 2021. Childbearing age.  BP Readings from Last 3 Encounters:  03/22/20 130/84  02/16/20 110/80  01/21/20 136/84   She is not exercising much, trying to work on diet.  She has noticed decreased libido and itching to her feet. Her GYN notified her that this was likely due to hyperglycemia.   Review of Systems  Eyes: Negative for visual disturbance.  Respiratory: Negative for shortness of breath.   Cardiovascular: Negative for chest pain.  Neurological: Negative for dizziness and numbness.       Past Medical History:  Diagnosis Date  . Acute conjunctivitis of right eye 07/10/2019  . Acute pain of right knee 08/06/2019  . Diabetes mellitus without complication (HCC)   . Eczema   . Hernia cerebri (HCC)   . History of gestational diabetes 05/02/2017   Normal pp testing 10/2017  . History of severe pre-eclampsia 05/02/2017  . Hypertension   . Mastitis 06/18/2019  . Migraine   . PCOS (polycystic ovarian  syndrome)   . Pregnancy induced hypertension      Social History   Socioeconomic History  . Marital status: Married    Spouse name: Not on file  . Number of children: Not on file  . Years of education: Not on file  . Highest education level: Not on file  Occupational History  . Not on file  Tobacco Use  . Smoking status: Never Smoker  . Smokeless tobacco: Never Used  Substance and Sexual Activity  . Alcohol use: No  . Drug use: No  . Sexual activity: Yes    Birth control/protection: Injection  Other Topics Concern  . Not on file  Social History Narrative   Married.   2 children.   Works at [employer].   Social Determinants of Health   Financial Resource Strain:   . Difficulty of Paying Living Expenses:   Food Insecurity:   . Worried About Programme researcher, broadcasting/film/video in the Last Year:   . Barista in the Last Year:   Transportation Needs:   . Freight forwarder (Medical):   Marland Kitchen Lack of Transportation (Non-Medical):   Physical Activity:   . Days of Exercise per Week:   . Minutes of Exercise per Session:   Stress:   . Feeling of Stress :   Social Connections:   . Frequency of Communication with Friends and Family:   . Frequency of Social Gatherings with Friends and Family:   . Attends Religious Services:   .  Active Member of Clubs or Organizations:   . Attends Banker Meetings:   Marland Kitchen Marital Status:   Intimate Partner Violence:   . Fear of Current or Ex-Partner:   . Emotionally Abused:   Marland Kitchen Physically Abused:   . Sexually Abused:     Past Surgical History:  Procedure Laterality Date  . CESAREAN SECTION  2017   Procedure: CESAREAN DELIVERY ONLY; Surgeon: Marian Sorrow, MD; Location: [facility]; Service: Obstetrics  . CESAREAN SECTION N/A 10/02/2017   Procedure: CESAREAN SECTION;  Surgeon: Tilda Burrow, MD;  Location: [facility] BIRTHING SUITES;  Service: Obstetrics;  Laterality: N/A;  . LAPAROSCOPIC OOPHERECTOMY Left   . LEEP      . OVARIAN CYST REMOVAL    . TONSILLECTOMY    . WISDOM TOOTH EXTRACTION      Family History  Problem Relation Age of Onset  . Diabetes Mother   . Migraines Mother   . Diabetes Father   . Hypertension Father   . Hypertension Maternal Grandmother   . Diabetes Maternal Grandmother   . Diabetes Maternal Grandfather   . COPD Maternal Grandfather   . Diabetes Paternal Grandmother   . Hypertension Paternal Grandmother   . Diabetes Paternal Grandfather   . Hypertension Paternal Grandfather   . Heart attack Paternal Grandfather   . Asthma Sister     Allergies  Allergen Reactions  . Ancef [Cefazolin] Hives and Swelling    Facial and neck swelling; Had reaction after receiving fentanyl and ancef in the OR  . Fentanyl Hives and Swelling    Facial and neck swelling; Had reaction after receiving fentanyl and ancef in the OR  . Pseudoephedrine Hcl Other (See Comments)  . Shrimp [Shellfish Allergy] Other (See Comments)    Swelling of throat  . Morphine And Related Rash    Current Outpatient Medications on File Prior to Visit  Medication Sig Dispense Refill  . Accu-Chek FastClix Lancets MISC Use as instructed to test for blood sugar up to 4 times a day 102 each 5  . albuterol (VENTOLIN HFA) 108 (90 Base) MCG/ACT inhaler Inhale 2 puffs into the lungs every 6 (six) hours as needed for wheezing or shortness of breath. 8 g 0  . medroxyPROGESTERone (DEPO-PROVERA) 150 MG/ML injection Inject 1 mL (150 mg total) into the muscle every 3 (three) months. 1 mL 2  . metFORMIN (GLUCOPHAGE-XR) 500 MG 24 hr tablet TAKE 2 TAB ONCE DAILY W/ BREAKFAST FOR DIABETES 60 tablet 2   No current facility-administered medications on file prior to visit.    BP 130/84   Pulse 93   Temp (!) 96.2 F (35.7 C) (Temporal)   Ht 5\' 9"  (1.753 m)   Wt 251 lb 6 oz (114 kg)   SpO2 100%   BMI 37.12 kg/m    Objective:   Physical Exam Cardiovascular:     Rate and Rhythm: Normal rate and regular rhythm.  Pulmonary:      Effort: Pulmonary effort is normal.     Breath sounds: Normal breath sounds.  Musculoskeletal:     Cervical back: Neck supple.  Skin:    General: Skin is warm and dry.            Assessment & Plan:

## 2020-03-22 NOTE — Assessment & Plan Note (Signed)
A1C today of 9.2 which is improved from last visit, but still over goal of <7.  She is compliant to Metformin XR 1000 mg, does have intermittent GI symptoms, will refrain from dose increase.  Discussed other treatment options and we agree to Trulicity once weekly. Start with 0.75 mg dose, increase to 3 mg if she can tolerate. She will update in 3 weeks.  Discussed to start checking glucose readings. Information provided.   Follow up in 3 months.

## 2020-03-22 NOTE — Patient Instructions (Signed)
Start checking your blood sugar levels.  Appropriate times to check your blood sugar levels are:  -Before any meal (breakfast, lunch, dinner) -Two hours after any meal (breakfast, lunch, dinner) -Bedtime  Record your readings and notify me if you continue to consistently run at or above 175.  Start Trulicity 0.75 mg once weekly for diabetes. Continue taking Metformin XR 1000 mg daily for diabetes.  It is important that you improve your diet. Please limit carbohydrates in the form of white bread, rice, pasta, sweets, fast food, fried food, sugary drinks, etc. Increase your consumption of fresh fruits and vegetables, whole grains, lean protein.  Ensure you are consuming 64 ounces of water daily.  Please schedule a follow up appointment in 3 months for diabetes check.  Please update me in 3 weeks in regards to Trulicity.   It was a pleasure to see you today!   Diabetes Mellitus and Nutrition, Adult When you have diabetes (diabetes mellitus), it is very important to have healthy eating habits because your blood sugar (glucose) levels are greatly affected by what you eat and drink. Eating healthy foods in the appropriate amounts, at about the same times every day, can help you:  Control your blood glucose.  Lower your risk of heart disease.  Improve your blood pressure.  Reach or maintain a healthy weight. Every person with diabetes is different, and each person has different needs for a meal plan. Your health care provider may recommend that you work with a diet and nutrition specialist (dietitian) to make a meal plan that is best for you. Your meal plan may vary depending on factors such as:  The calories you need.  The medicines you take.  Your weight.  Your blood glucose, blood pressure, and cholesterol levels.  Your activity level.  Other health conditions you have, such as heart or kidney disease. How do carbohydrates affect me? Carbohydrates, also called carbs, affect  your blood glucose level more than any other type of food. Eating carbs naturally raises the amount of glucose in your blood. Carb counting is a method for keeping track of how many carbs you eat. Counting carbs is important to keep your blood glucose at a healthy level, especially if you use insulin or take certain oral diabetes medicines. It is important to know how many carbs you can safely have in each meal. This is different for every person. Your dietitian can help you calculate how many carbs you should have at each meal and for each snack. Foods that contain carbs include:  Bread, cereal, rice, pasta, and crackers.  Potatoes and corn.  Peas, beans, and lentils.  Milk and yogurt.  Fruit and juice.  Desserts, such as cakes, cookies, ice cream, and candy. How does alcohol affect me? Alcohol can cause a sudden decrease in blood glucose (hypoglycemia), especially if you use insulin or take certain oral diabetes medicines. Hypoglycemia can be a life-threatening condition. Symptoms of hypoglycemia (sleepiness, dizziness, and confusion) are similar to symptoms of having too much alcohol. If your health care provider says that alcohol is safe for you, follow these guidelines:  Limit alcohol intake to no more than 1 drink per day for nonpregnant women and 2 drinks per day for men. One drink equals 12 oz of beer, 5 oz of wine, or 1 oz of hard liquor.  Do not drink on an empty stomach.  Keep yourself hydrated with water, diet soda, or unsweetened iced tea.  Keep in mind that regular soda, juice, and  other mixers may contain a lot of sugar and must be counted as carbs. What are tips for following this plan?  Reading food labels  Start by checking the serving size on the "Nutrition Facts" label of packaged foods and drinks. The amount of calories, carbs, fats, and other nutrients listed on the label is based on one serving of the item. Many items contain more than one serving per package.   Check the total grams (g) of carbs in one serving. You can calculate the number of servings of carbs in one serving by dividing the total carbs by 15. For example, if a food has 30 g of total carbs, it would be equal to 2 servings of carbs.  Check the number of grams (g) of saturated and trans fats in one serving. Choose foods that have low or no amount of these fats.  Check the number of milligrams (mg) of salt (sodium) in one serving. Most people should limit total sodium intake to less than 2,300 mg per day.  Always check the nutrition information of foods labeled as "low-fat" or "nonfat". These foods may be higher in added sugar or refined carbs and should be avoided.  Talk to your dietitian to identify your daily goals for nutrients listed on the label. Shopping  Avoid buying canned, premade, or processed foods. These foods tend to be high in fat, sodium, and added sugar.  Shop around the outside edge of the grocery store. This includes fresh fruits and vegetables, bulk grains, fresh meats, and fresh dairy. Cooking  Use low-heat cooking methods, such as baking, instead of high-heat cooking methods like deep frying.  Cook using healthy oils, such as olive, canola, or sunflower oil.  Avoid cooking with butter, cream, or high-fat meats. Meal planning  Eat meals and snacks regularly, preferably at the same times every day. Avoid going long periods of time without eating.  Eat foods high in fiber, such as fresh fruits, vegetables, beans, and whole grains. Talk to your dietitian about how many servings of carbs you can eat at each meal.  Eat 4-6 ounces (oz) of lean protein each day, such as lean meat, chicken, fish, eggs, or tofu. One oz of lean protein is equal to: ? 1 oz of meat, chicken, or fish. ? 1 egg. ?  cup of tofu.  Eat some foods each day that contain healthy fats, such as avocado, nuts, seeds, and fish. Lifestyle  Check your blood glucose regularly.  Exercise regularly  as told by your health care provider. This may include: ? 150 minutes of moderate-intensity or vigorous-intensity exercise each week. This could be brisk walking, biking, or water aerobics. ? Stretching and doing strength exercises, such as yoga or weightlifting, at least 2 times a week.  Take medicines as told by your health care provider.  Do not use any products that contain nicotine or tobacco, such as cigarettes and e-cigarettes. If you need help quitting, ask your health care provider.  Work with a Veterinary surgeon or diabetes educator to identify strategies to manage stress and any emotional and social challenges. Questions to ask a health care provider  Do I need to meet with a diabetes educator?  Do I need to meet with a dietitian?  What number can I call if I have questions?  When are the best times to check my blood glucose? Where to find more information:  American Diabetes Association: diabetes.org  Academy of Nutrition and Dietetics: www.eatright.[employer] of Diabetes and Digestive  and Kidney Diseases (NIH): CarFlippers.tn Summary  A healthy meal plan will help you control your blood glucose and maintain a healthy lifestyle.  Working with a diet and nutrition specialist (dietitian) can help you make a meal plan that is best for you.  Keep in mind that carbohydrates (carbs) and alcohol have immediate effects on your blood glucose levels. It is important to count carbs and to use alcohol carefully. This information is not intended to replace advice given to you by your health care provider. Make sure you discuss any questions you have with your health care provider. Document Revised: 07/19/2017 Document Reviewed: 09/10/2016 Elsevier Patient Education  2020 [employer].

## 2020-03-25 ENCOUNTER — Encounter (HOSPITAL_COMMUNITY): Payer: Self-pay | Admitting: Emergency Medicine

## 2020-03-25 ENCOUNTER — Telehealth: Payer: Self-pay

## 2020-03-25 ENCOUNTER — Emergency Department (HOSPITAL_COMMUNITY): Payer: 59

## 2020-03-25 ENCOUNTER — Other Ambulatory Visit: Payer: Self-pay

## 2020-03-25 ENCOUNTER — Emergency Department (HOSPITAL_COMMUNITY)
Admission: EM | Admit: 2020-03-25 | Discharge: 2020-03-25 | Disposition: A | Discharge status: Home / Self Care | Payer: 59 | Attending: Emergency Medicine | Admitting: Emergency Medicine

## 2020-03-25 DIAGNOSIS — E119 Type 2 diabetes mellitus without complications: Secondary | ICD-10-CM | POA: Insufficient documentation

## 2020-03-25 DIAGNOSIS — Z7984 Long term (current) use of oral hypoglycemic drugs: Secondary | ICD-10-CM | POA: Insufficient documentation

## 2020-03-25 DIAGNOSIS — R072 Precordial pain: Secondary | ICD-10-CM | POA: Insufficient documentation

## 2020-03-25 DIAGNOSIS — I10 Essential (primary) hypertension: Secondary | ICD-10-CM | POA: Diagnosis not present

## 2020-03-25 DIAGNOSIS — R0789 Other chest pain: Secondary | ICD-10-CM

## 2020-03-25 LAB — CBC
HCT: 42.8 % (ref 36.0–46.0)
Hemoglobin: 12.7 g/dL (ref 12.0–15.0)
MCH: 22 pg — ABNORMAL LOW (ref 26.0–34.0)
MCHC: 29.7 g/dL — ABNORMAL LOW (ref 30.0–36.0)
MCV: 74.3 fL — ABNORMAL LOW (ref 80.0–100.0)
Platelets: 270 10*3/uL (ref 150–400)
RBC: 5.76 MIL/uL — ABNORMAL HIGH (ref 3.87–5.11)
RDW: 14.1 % (ref 11.5–15.5)
WBC: 6.5 10*3/uL (ref 4.0–10.5)
nRBC: 0 % (ref 0.0–0.2)

## 2020-03-25 LAB — BASIC METABOLIC PANEL
Anion gap: 12 (ref 5–15)
BUN: 10 mg/dL (ref 6–20)
CO2: 24 mmol/L (ref 22–32)
Calcium: 10.1 mg/dL (ref 8.9–10.3)
Chloride: 101 mmol/L (ref 98–111)
Creatinine, Ser: 0.66 mg/dL (ref 0.44–1.00)
GFR calc Af Amer: >60 mL/min (ref >60)
GFR calc non Af Amer: >60 mL/min (ref >60)
Glucose, Bld: 131 mg/dL — ABNORMAL HIGH (ref 70–99)
Potassium: 4.1 mmol/L (ref 3.5–5.1)
Sodium: 137 mmol/L (ref 135–145)

## 2020-03-25 LAB — D-DIMER, QUANTITATIVE: D-Dimer, Quant: 0.44 ug/mL-FEU (ref 0.00–0.50)

## 2020-03-25 LAB — TROPONIN I (HIGH SENSITIVITY)
Troponin I (High Sensitivity): <2 ng/L (ref <18)
Troponin I (High Sensitivity): <2 ng/L (ref <18)

## 2020-03-25 MED ORDER — PANTOPRAZOLE SODIUM 40 MG IV SOLR: 40.0000 mg | Freq: Once | INTRAVENOUS | Status: DC

## 2020-03-25 MED ORDER — PANTOPRAZOLE SODIUM 40 MG PO TBEC
40.0000 mg | DELAYED_RELEASE_TABLET | Freq: Once | ORAL | Status: AC
  Administered 2020-03-25: 40 mg via ORAL
  Filled 2020-03-25: qty 1

## 2020-03-25 MED ORDER — SODIUM CHLORIDE 0.9% FLUSH: 3.0000 mL | Freq: Once | INTRAVENOUS | Status: DC

## 2020-03-25 MED ORDER — PANTOPRAZOLE SODIUM 20 MG PO TBEC
20.0000 mg | DELAYED_RELEASE_TABLET | Freq: Two times a day (BID) | ORAL | 0 refills | Status: DC
Start: 2020-03-25 — End: 2020-04-08

## 2020-03-25 NOTE — ED Provider Notes (Signed)
[facility] EMERGENCY DEPARTMENT Provider Note   CSN: [id number] Arrival date & time: 03/25/20  1050     History Chief Complaint  Patient presents with  . Chest Pain    Tokiko Stehanie Ekstrom is a 34 y.o. female.  HPI      Jazmine Josalyn Dettmann is a 34 y.o. female with past medical history of hypertension, diabetes, and polycystic ovarian syndrome.  she presents to the Emergency Department complaining of substernal left and left-sided chest pain that began last night around 10 PM.  Pain is been persistent since onset.  She describes a waxing and waning between sharp stabbing pain and chest pressure.  Pain onset after eating dinner.  She took Tums last evening without relief.  81 mg aspirin this morning also without relief.  Pain does not radiate to her jaw, left arm, or back.  She denies shortness of breath or dyspnea on exertion.  No previous cardiac history. She did have Covid earlier this year and she receives quarterly Depo injections.      Past Medical History:  Diagnosis Date  . Acute conjunctivitis of right eye 07/10/2019  . Acute pain of right knee 08/06/2019  . Diabetes mellitus without complication (HCC)   . Eczema   . Hernia cerebri (HCC)   . History of gestational diabetes 05/02/2017   Normal pp testing 10/2017  . History of severe pre-eclampsia 05/02/2017  . Hypertension   . Mastitis 06/18/2019  . Migraine   . PCOS (polycystic ovarian syndrome)   . Pregnancy induced hypertension     Patient Active Problem List   Diagnosis Date Noted  . Impingement syndrome of left shoulder 02/16/2020  . Type 2 diabetes mellitus (HCC) 12/21/2019  . History of COVID-19 12/21/2019  . Anemia 10/14/2019  . Hypertension 10/13/2019  . Breakthrough bleeding on depo provera 10/13/2019  . Migraines 04/01/2018  . Abnormal uterine bleeding (AUB) 01/06/2018  . Obesity (BMI 30-39.9) 08/14/2017    Past Surgical History:  Procedure Laterality Date  . CESAREAN SECTION  2017    Procedure: CESAREAN DELIVERY ONLY; Surgeon: Marian Sorrow, MD; [facility]CTION [facility]; Service: Obstetrics  . CESAREAN SECTION N/A 10/02/2017   Procedure: CESAREAN SECTION;  Surgeon: Tilda Burrow, MD;  Location: [facility] BIRTHING SUITES;  Service: Obstetrics;  Laterality: N/A;  . LAPAROSCOPIC OOPHERECTOMY Left   . LEEP    . OVARIAN CYST REMOVAL    . TONSILLECTOMY    . WISDOM TOOTH EXTRACTION       OB History    Gravida  2   Para  2   Term  2   Preterm  0   AB  0   Living  2     SAB  0   TAB  0   Ectopic  0   Multiple  0   Live Births  2           Family History  Problem Relation Age of Onset  . Diabetes Mother   . Migraines Mother   . Diabetes Father   . Hypertension Father   . Hypertension Maternal Grandmother   . Diabetes Maternal Grandmother   . Diabetes Maternal Grandfather   . COPD Maternal Grandfather   . Diabetes Paternal Grandmother   . Hypertension Paternal Grandmother   . Diabetes Paternal Grandfather   . Hypertension Paternal Grandfather   . Heart attack Paternal Grandfather   . Asthma Sister     Social History   Tobacco Use  . Smoking status:  Never Smoker  . Smokeless tobacco: Never Used  Substance Use Topics  . Alcohol use: No  . Drug use: No    Home Medications Prior to Admission medications   Medication Sig Start Date End Date Taking? Authorizing Provider  albuterol (VENTOLIN HFA) 108 (90 Base) MCG/ACT inhaler Inhale 2 puffs into the lungs every 6 (six) hours as needed for wheezing or shortness of breath. 09/24/19  Yes Baity, Salvadore Oxford, NP  Dulaglutide (TRULICITY) 0.75 MG/0.5ML SOPN Inject 0.5 mLs (0.75 mg total) into the skin once a week. For diabetes. 03/22/20  Yes Doreene Nest, NP  medroxyPROGESTERone (DEPO-PROVERA) 150 MG/ML injection Inject 1 mL (150 mg total) into the muscle every 3 (three) months. 08/12/19  Yes Sharyon Cable, CNM  metFORMIN (GLUCOPHAGE-XR) 500 MG 24 hr tablet TAKE 2 TAB ONCE DAILY W/  BREAKFAST FOR DIABETES 02/10/20  Yes Doreene Nest, NP  Accu-Chek FastClix Lancets MISC Use as instructed to test for blood sugar up to 4 times a day 01/07/20   Doreene Nest, NP    Allergies    Ancef [cefazolin], Fentanyl, Pseudoephedrine hcl, Shrimp [shellfish allergy], and Morphine and related  Review of Systems   Review of Systems  Constitutional: Negative for chills, fatigue and fever.  HENT: Negative for sore throat and trouble swallowing.   Respiratory: Positive for chest tightness. Negative for cough, shortness of breath and wheezing.   Cardiovascular: Positive for chest pain. Negative for palpitations and leg swelling.  Gastrointestinal: Negative for abdominal pain, nausea and vomiting.  Genitourinary: Negative for dysuria, flank pain and hematuria.  Musculoskeletal: Negative for arthralgias, back pain, myalgias, neck pain and neck stiffness.  Skin: Negative for rash.  Neurological: Negative for dizziness, weakness, numbness and headaches.  Hematological: Does not bruise/bleed easily.    Physical Exam Updated Vital Signs BP (!) 127/99 (BP Location: Right Arm)   Pulse 89   Temp 98.4 F (36.9 C) (Oral)   Resp 19   Ht 5\' 9"  (1.753 m)   Wt 114.3 kg   SpO2 100%   BMI 37.21 kg/m   Physical Exam Vitals and nursing note reviewed.  Constitutional:      Appearance: Normal appearance. She is well-developed. She is not ill-appearing or toxic-appearing.  Cardiovascular:     Rate and Rhythm: Normal rate and regular rhythm.     Pulses: Normal pulses.  Pulmonary:     Effort: Pulmonary effort is normal. No respiratory distress.     Breath sounds: Normal breath sounds.     Comments: Mild tenderness to palpation of the left upper chest wall.  No rash or crepitus. Chest:     Chest wall: Tenderness present.  Abdominal:     General: There is no distension.     Palpations: Abdomen is soft.     Tenderness: There is no abdominal tenderness.  Musculoskeletal:     Right  lower leg: No edema.     Left lower leg: No edema.  Lymphadenopathy:     Cervical: No cervical adenopathy.  Skin:    General: Skin is warm.     Capillary Refill: Capillary refill takes less than 2 seconds.     Findings: No erythema or rash.  Neurological:     General: No focal deficit present.     Mental Status: She is alert.     Sensory: No sensory deficit.     Motor: No weakness.     ED Results / Procedures / Treatments   Labs (all labs ordered are  listed, but only abnormal results are displayed) Labs Reviewed  BASIC METABOLIC PANEL - Abnormal; Notable for the following components:      Result Value   Glucose, Bld 131 (*)    All other components within normal limits  CBC - Abnormal; Notable for the following components:   RBC 5.76 (*)    MCV 74.3 (*)    MCH 22.0 (*)    MCHC 29.7 (*)    All other components within normal limits  D-DIMER, QUANTITATIVE (NOT AT [facility])  POC URINE PREG, ED  TROPONIN I (HIGH SENSITIVITY)  TROPONIN I (HIGH SENSITIVITY)    EKG EKG Interpretation  Date/Time:  Friday March 25 2020 11:41:40 EDT Ventricular Rate:  93 PR Interval:  148 QRS Duration: 84 QT Interval:  334 QTC Calculation: 415 R Axis:   92 Text Interpretation: Normal sinus rhythm Rightward axis Nonspecific T wave abnormality Abnormal ECG No STEMI Confirmed by Alona Bene [phone]) on 03/25/2020 12:32:41 PM   Radiology DG Chest 2 View  Result Date: 03/25/2020 CLINICAL DATA:  Left-sided chest pain for 2 days, history of COVID-19 positivity EXAM: CHEST - 2 VIEW COMPARISON:  09/28/2019 FINDINGS: The heart size and mediastinal contours are within normal limits. Both lungs are clear. The visualized skeletal structures are unremarkable. IMPRESSION: No active cardiopulmonary disease. Electronically Signed   By: Alcide Clever M.D.   On: 03/25/2020 11:58    Procedures Procedures (including critical care time)  Medications Ordered in ED Medications  sodium chloride flush (NS) 0.9 %  injection 3 mL (has no administration in time range)    ED Course  I have reviewed the triage vital signs and the nursing notes.  Pertinent labs & imaging results that were available during my care of the patient were reviewed by me and considered in my medical decision making (see chart for details).    MDM Rules/Calculators/A&P                          Patient with substernal and left-sided chest pain since 10 PM last evening.  Pain began after eating dinner, has been persistent since onset.  Not relieved with belching or Tums.  Patient took 81 mg aspirin this morning also without relief.  No history of cardiac disease.  Vitals reviewed, no tachycardia, tachypnea or hypoxia.  No dyspnea.  Doubt PE, but given that patient has some risk factors will check D-dimer.    HEART pathway score 2  EKG without acute ischemic changes.  Initial troponin reassuring.  Electrolytes and CBC without significant findings.  On recheck, patient resting comfortably, tolerating oral fluids without difficulty.  D-dimer reassuring.  Delta troponin unchanged.  Symptoms possibly related to GERD.  I feel that patient is appropriate for discharge home, will start on PPI and she agrees to close follow-up with cardiology.  Strict return precautions were also discussed.  Final Clinical Impression(s) / ED Diagnoses Final diagnoses:  Atypical chest pain    Rx / DC Orders ED Discharge Orders    None       Pauline Aus, PA-C 03/25/20 1944    Vanetta Mulders, MD 03/29/20 [id number]

## 2020-03-25 NOTE — Telephone Encounter (Signed)
Patient left VM on triage line, stating that she started a new trulicity injection on Wednesday, and she also takes metformin. She states she noticed after the trulicity injection and taking her metformin on Wednesday, she started feeling very nauseated and had some chest pain. Patient states these sx have been lingering on and off the past few days - so she wants to make sure she does not need a new medication dose, or if something more serious is going on. I sent call to access nurse line.  Will also route to [place] as FYI.

## 2020-03-25 NOTE — Discharge Instructions (Addendum)
Your work-up today was reassuring.  I have prescribed a medication for you to start taking for reflux.  Follow-up with your primary doctor for recheck.  Also, call the cardiologist office listed to arrange a follow-up appointment as well.  Return emergency department if you develop any worsening symptoms.

## 2020-03-25 NOTE — Telephone Encounter (Signed)
[facility] Day - Client TELEPHONE ADVICE RECORD [facility] Patient Name: Julia Kline Gender: Female DOB: [birth year] Age: 34 Y 5 M 12 D Return Phone Number: [phone] (Primary) Address: City/State/Zip: [place] [address] Client  [facility] Day - Client Client Site  [facility] [place] - Day Physician Vernona Rieger - NP Contact Type Call Who Is Calling Patient / Member / Family / Caregiver Call Type Triage / Clinical Relationship To Patient Self Return Phone Number [phone] (Primary) Chief Complaint CHEST PAIN (>=21 years) - pain, pressure, heaviness or tightness Reason for Call Symptomatic / Request for Health Information Initial Comment Caller has chest pain and nausea. GOTO Facility Not Listed [place] Translation No Nurse Assessment Nurse: Stefano Gaul, RN, Dwana Curd Date/Time (Eastern Time): 03/25/2020 8:53:10 AM Confirm and document reason for call. If symptomatic, describe symptoms. ---Caller states she has chest pain on the left side. She was nauseated last night. Pain started last night. No SOB. Has the patient had close contact with a person known or suspected to have the novel coronavirus illness OR traveled / lives in area with major community spread (including international travel) in the last 14 days from the onset of symptoms? * If Asymptomatic, screen for exposure and travel within the last 14 days. ---No Does the patient have any new or worsening symptoms? ---Yes Will a triage be completed? ---Yes Related visit to physician within the last 2 weeks? ---No Does the PT have any chronic conditions? (i.e. diabetes, asthma, this includes High risk factors for pregnancy, etc.) ---Yes List chronic conditions. ---diabetes Is the patient pregnant or possibly pregnant? (Ask all females between the ages of 64-55) ---No Is this a behavioral health or substance abuse call? ---No Guidelines Guideline Title Affirmed  Question Affirmed Notes Nurse Date/Time (Eastern Time) Chest Pain [1] Chest pain lasts > 5 minutes AND [2] age > 30 AND [3] one or more cardiac risk factors (e.g., diabetes, high blood Stefano Gaul, RN, Dwana Curd 03/25/2020 8:54:47 AM PLEASE NOTE: All timestamps contained within this report are represented as [place] Standard Time. CONFIDENTIALTY NOTICE: This fax transmission is intended only for the addressee. It contains information that is legally privileged, confidential or otherwise protected from use or disclosure. If you are not the intended recipient, you are strictly prohibited from reviewing, disclosing, copying using or disseminating any of this information or taking any action in reliance on or regarding this information. If you have received this fax in error, please notify us immediately by telephone so that we can arrange for its return to [place]. Phone: [phone], Toll-Free: [phone], Fax: [phone] Page: 2 of 2 Call Id: [id number] Guidelines Guideline Title Affirmed Question Affirmed Notes Nurse Date/Time Lamount Cohen Time) pressure, high cholesterol, smoker, or strong family history of heart disease) Disp. Time Lamount Cohen Time) Disposition Final User 03/25/2020 8:51:58 AM Send to Urgent Queue Leotis Shames 03/25/2020 8:59:00 AM Send To RN Personal Stefano Gaul, RN, Vera 03/25/2020 9:03:19 AM 911 Outcome Documentation Stringer, RN, Dwana Curd Reason: attempted 911 outcome documentation call and pt states she is on the way to drop her kids off and then go to the ER 03/25/2020 8:58:12 AM Call EMS 911 Now Yes Stefano Gaul, RN, Clerance Lav Disagree/Comply Disagree Caller Understands Yes PreDisposition Call Doctor Care Advice Given Per Guideline CALL EMS 911 NOW: * Immediate medical attention is needed. You need to hang up and call 911 (or an ambulance). IF CALLER ASKS ABOUT ASPIRIN: * Call EMS 911 first. Comments User: Art Buff, RN Date/Time Lamount Cohen Time): 03/25/2020 8:57:45 AM  pt states she  has to take her kids to child care and will have someone take her to the ER Referrals GO TO FACILITY OTHER - SPECIFY

## 2020-03-25 NOTE — ED Triage Notes (Signed)
Pain in the center of chest that started last night.  Took tums and low dose asa with no relief

## 2020-03-25 NOTE — Telephone Encounter (Signed)
Per chart review tab pt is at [facility] ED.

## 2020-03-25 NOTE — Telephone Encounter (Signed)
Pt at [facility] ED, will follow

## 2020-03-25 NOTE — Telephone Encounter (Signed)
Noted, will review ED note 

## 2020-04-07 ENCOUNTER — Other Ambulatory Visit: Payer: Self-pay

## 2020-04-07 ENCOUNTER — Ambulatory Visit: Payer: 59

## 2020-04-07 ENCOUNTER — Telehealth: Payer: Self-pay | Admitting: Primary Care

## 2020-04-07 DIAGNOSIS — E119 Type 2 diabetes mellitus without complications: Secondary | ICD-10-CM

## 2020-04-07 DIAGNOSIS — Z3042 Encounter for surveillance of injectable contraceptive: Secondary | ICD-10-CM

## 2020-04-07 DIAGNOSIS — K219 Gastro-esophageal reflux disease without esophagitis: Secondary | ICD-10-CM

## 2020-04-07 MED ORDER — MEDROXYPROGESTERONE ACETATE 150 MG/ML IM SUSP: 150.0000 mg | INTRAMUSCULAR | 2 refills | Status: DC

## 2020-04-07 NOTE — Telephone Encounter (Signed)
Pt said myChart app is working and below is her glucose readings. She said you can call her to let her know about medication.  Glucose Readings  8/4 269 fasting 215 after lunch 159 bedtime  8/5 249 fasting 211 bedtime  8/6 197 fasting  8/8 320 after breakfast 2 hours later 165  8/9 168 fasting 219 after breakfast  8/10 191 fasting Before dinner 136 248 bedtime  8/11 170 fasting 150 bedtime  8/12 131 fasting 108 before lunch 166 after lunch 128 bedtime  8/13 166 after lunch 122 before dinner  8/16 112 fasting 200 after lunch

## 2020-04-07 NOTE — Telephone Encounter (Signed)
Please thank patient for the readings and update. Is she able to tolerate the Trulicity okay? If so then I'm going to increase her dose to 1.5 mg, I will send a new Rx to pharmacy.  Let me know.

## 2020-04-08 ENCOUNTER — Other Ambulatory Visit: Payer: Self-pay

## 2020-04-08 ENCOUNTER — Ambulatory Visit (INDEPENDENT_AMBULATORY_CARE_PROVIDER_SITE_OTHER): Payer: 59 | Admitting: *Deleted

## 2020-04-08 VITALS — BP 138/95 | HR 97

## 2020-04-08 DIAGNOSIS — Z3042 Encounter for surveillance of injectable contraceptive: Secondary | ICD-10-CM

## 2020-04-08 MED ORDER — MEDROXYPROGESTERONE ACETATE 150 MG/ML IM SUSP
150.0000 mg | Freq: Once | INTRAMUSCULAR | Status: AC
  Administered 2020-04-08: 150 mg via INTRAMUSCULAR

## 2020-04-08 MED ORDER — PANTOPRAZOLE SODIUM 20 MG PO TBEC: DELAYED_RELEASE_TABLET | ORAL | 0 refills | Status: DC

## 2020-04-08 MED ORDER — TRULICITY 1.5 MG/0.5ML ~~LOC~~ SOAJ: 1.5000 mg | SUBCUTANEOUS | 1 refills | Status: DC

## 2020-04-08 NOTE — Progress Notes (Signed)
Patient seen and assessed by nursing staff.  Agree with documentation and plan.  

## 2020-04-08 NOTE — Telephone Encounter (Signed)
Rx for Trulicity 1.5 mg sent to pharmacy. I did refill pantoprazole, have her try taking this once daily rather than twice daily.

## 2020-04-08 NOTE — Addendum Note (Signed)
Addended by: Doreene Nest on: 04/08/2020 05:01 PM   Modules accepted: Orders

## 2020-04-08 NOTE — Progress Notes (Signed)
Date last pap: 2018. Last Depo-Provera: 01/21/2020. Side Effects if any: none. Serum HCG indicated? NA. Depo-Provera 150 mg IM given by: Scheryl Marten, RN. Next appointment due Nov 5-19th.Marland Kitchen

## 2020-04-08 NOTE — Telephone Encounter (Signed)
Spoken to patient earlier today. Patient is agreeable to the increased. Also patient request if Jae Dire can refill pantoprazol 20 mg that was last at the ED. This really helps with the chest pains and she takes Tums as needed.

## 2020-04-11 ENCOUNTER — Other Ambulatory Visit: Payer: Self-pay | Admitting: Primary Care

## 2020-04-11 DIAGNOSIS — E119 Type 2 diabetes mellitus without complications: Secondary | ICD-10-CM

## 2020-04-11 NOTE — Telephone Encounter (Signed)
Spoken and notified patient of Kate Clark's comments. Verbalized understanding. ° °

## 2020-04-19 ENCOUNTER — Telehealth: Payer: Self-pay | Admitting: *Deleted

## 2020-04-19 NOTE — Telephone Encounter (Signed)
Pt called asking if PCP could write a Rx for an antibiotic. Pt said she has boils under her left arm. I did advise pt that usually providers want an appt to eval boils before prescribing abx. Pt said she gets them all the time and doesn't want an appt she wants me to send the request to PCP. Pt said she started with 1 boil under her left under arm and now it feels like there may be 2 or 3 under there. Pt said it's red an swollen and warm to the touch. Pt wants PCP to send in an abx to help.   [facility] [address]

## 2020-04-19 NOTE — Telephone Encounter (Signed)
Patient needs evaluation.  Please schedule.  

## 2020-04-19 NOTE — Telephone Encounter (Signed)
Pt called back and appt scheduled 04/20/20

## 2020-04-19 NOTE — Telephone Encounter (Signed)
Attempted to reach patient. No answer and mailbox is full.

## 2020-04-20 ENCOUNTER — Ambulatory Visit (INDEPENDENT_AMBULATORY_CARE_PROVIDER_SITE_OTHER): Payer: 59 | Admitting: Primary Care

## 2020-04-20 ENCOUNTER — Other Ambulatory Visit: Payer: Self-pay

## 2020-04-20 ENCOUNTER — Other Ambulatory Visit: Payer: Self-pay | Admitting: Primary Care

## 2020-04-20 ENCOUNTER — Encounter: Payer: Self-pay | Admitting: Primary Care

## 2020-04-20 DIAGNOSIS — L732 Hidradenitis suppurativa: Secondary | ICD-10-CM

## 2020-04-20 DIAGNOSIS — K219 Gastro-esophageal reflux disease without esophagitis: Secondary | ICD-10-CM

## 2020-04-20 HISTORY — DX: Hidradenitis suppurativa: L73.2

## 2020-04-20 MED ORDER — DOXYCYCLINE HYCLATE 100 MG PO TABS: 100.0000 mg | ORAL_TABLET | Freq: Two times a day (BID) | ORAL | 0 refills | Status: DC

## 2020-04-20 NOTE — Assessment & Plan Note (Signed)
Acute on chronic flare to left axilla. Appears infectious at this time. Prescription for doxycycline course sent to pharmacy.  She is ready for permanent resolve of her hidradenitis suppurative, will refer her to Derm/general surgery for evaluation.

## 2020-04-20 NOTE — Progress Notes (Signed)
Subjective:    Patient ID: Julia Kline, female    DOB: [birth year], 34 y.o.   MRN: [id number]  HPI  This visit occurred during the SARS-CoV-2 public health emergency.  Safety protocols were in place, including screening questions prior to the visit, additional usage of staff PPE, and extensive cleaning of exam room while observing appropriate contact time as indicated for disinfecting solutions.   Patient will be seen in her car outside as she was exposed to Covid-19 three days ago. She has no symptoms, but to protect other patients and staff we will exercise cautions.   Julia Kline is a 34 year old female with a history of recurrent boils from hidradenitis, type 2 diabetes, anemia who presents today with a chief complaint of axillary pain.   She's noticed three boils to the left axilla that began 4 days ago.  History of recurrent hidradenitis suppurativa to axillary regions.  Over the last several months she has noticed boils present at least once monthly.  Last night she noticed quite a bit of drainage from the site with foul smell.  She denies fevers.  She wants all dermatology during her high school years, was told that she needed to have surgical intervention for permanent resolved.  Review of Systems  Constitutional: Negative for fever.  Skin: Positive for color change and wound.       Past Medical History:  Diagnosis Date  . Acute conjunctivitis of right eye 07/10/2019  . Acute pain of right knee 08/06/2019  . Diabetes mellitus without complication (HCC)   . Eczema   . Hernia cerebri (HCC)   . History of gestational diabetes 05/02/2017   Normal pp testing 10/2017  . History of severe pre-eclampsia 05/02/2017  . Hypertension   . Mastitis 06/18/2019  . Migraine   . PCOS (polycystic ovarian syndrome)   . Pregnancy induced hypertension      Social History   Socioeconomic History  . Marital status: Married    Spouse name: Not on file  . Number of children: Not on  file  . Years of education: Not on file  . Highest education level: Not on file  Occupational History  . Not on file  Tobacco Use  . Smoking status: Never Smoker  . Smokeless tobacco: Never Used  Substance and Sexual Activity  . Alcohol use: No  . Drug use: No  . Sexual activity: Yes    Birth control/protection: Injection  Other Topics Concern  . Not on file  Social History Narrative   Married.   2 children.   Works at [employer].   Social Determinants of Health   Financial Resource Strain:   . Difficulty of Paying Living Expenses: Not on file  Food Insecurity:   . Worried About Programme researcher, broadcasting/film/video in the Last Year: Not on file  . Ran Out of Food in the Last Year: Not on file  Transportation Needs:   . Lack of Transportation (Medical): Not on file  . Lack of Transportation (Non-Medical): Not on file  Physical Activity:   . Days of Exercise per Week: Not on file  . Minutes of Exercise per Session: Not on file  Stress:   . Feeling of Stress : Not on file  Social Connections:   . Frequency of Communication with Friends and Family: Not on file  . Frequency of Social Gatherings with Friends and Family: Not on file  . Attends Religious Services: Not on file  . Active Member  of Clubs or Organizations: Not on file  . Attends Banker Meetings: Not on file  . Marital Status: Not on file  Intimate Partner Violence:   . Fear of Current or Ex-Partner: Not on file  . Emotionally Abused: Not on file  . Physically Abused: Not on file  . Sexually Abused: Not on file    Past Surgical History:  Procedure Laterality Date  . CESAREAN SECTION  2017   Procedure: CESAREAN DELIVERY ONLY; Surgeon: Marian Sorrow, MD; [facility]; Service: Obstetrics  . CESAREAN SECTION N/A 10/02/2017   Procedure: CESAREAN SECTION;  Surgeon: Tilda Burrow, MD;  Location: [facility] SU[facility];  Service: Obstetrics;  Laterality: N/A;  . LAPAROSCOPIC OOPHERECTOMY  Left   . LEEP    . OVARIAN CYST REMOVAL    . TONSILLECTOMY    . WISDOM TOOTH EXTRACTION      Family History  Problem Relation Age of Onset  . Diabetes Mother   . Migraines Mother   . Diabetes Father   . Hypertension Father   . Hypertension Maternal Grandmother   . Diabetes Maternal Grandmother   . Diabetes Maternal Grandfather   . COPD Maternal Grandfather   . Diabetes Paternal Grandmother   . Hypertension Paternal Grandmother   . Diabetes Paternal Grandfather   . Hypertension Paternal Grandfather   . Heart attack Paternal Grandfather   . Asthma Sister     Allergies  Allergen Reactions  . Ancef [Cefazolin] Hives and Swelling    Facial and neck swelling; Had reaction after receiving fentanyl and ancef in the OR  . Fentanyl Hives and Swelling    Facial and neck swelling; Had reaction after receiving fentanyl and ancef in the OR  . Pseudoephedrine Hcl Other (See Comments)  . Shrimp [Shellfish Allergy] Other (See Comments)    Swelling of throat  . Morphine And Related Rash    Current Outpatient Medications on File Prior to Visit  Medication Sig Dispense Refill  . Accu-Chek FastClix Lancets MISC Use as instructed to test for blood sugar up to 4 times a day 102 each 5  . albuterol (VENTOLIN HFA) 108 (90 Base) MCG/ACT inhaler Inhale 2 puffs into the lungs every 6 (six) hours as needed for wheezing or shortness of breath. 8 g 0  . Dulaglutide (TRULICITY) 1.5 MG/0.5ML SOPN Inject 0.5 mLs (1.5 mg total) into the skin once a week. For diabetes. 2 mL 1  . medroxyPROGESTERone (DEPO-PROVERA) 150 MG/ML injection Inject 1 mL (150 mg total) into the muscle every 3 (three) months. 1 mL 2  . metFORMIN (GLUCOPHAGE) 1000 MG tablet Take 1,000 mg by mouth 2 (two) times daily.    . metFORMIN (GLUCOPHAGE-XR) 500 MG 24 hr tablet TAKE 2 TAB ONCE DAILY W/ BREAKFAST FOR DIABETES 60 tablet 2  . pantoprazole (PROTONIX) 20 MG tablet Take 1 tablet by mouth once to twice daily for heartburn. 30 tablet 0     No current facility-administered medications on file prior to visit.    Ht 5\' 9"  (1.753 m)   Wt 252 lb (114.3 kg)   BMI 37.21 kg/m    Objective:   Physical Exam Skin:    General: Skin is warm.     Comments: 3 boils noted to left axilla. Larger abscess noted within cutaneous area of left axilla.  Tender, erythema, scant amount of purulent drainage.   Neurological:     Mental Status: She is alert.  Assessment & Plan:

## 2020-04-20 NOTE — Patient Instructions (Signed)
Start Doxycycline antibiotic for the infection. Take 1 tablet by mouth twice daily for 7 days.  You will be contacted regarding your referral to general surgery.  Please let us know if you have not been contacted within two weeks.   It was a pleasure to see you today!

## 2020-05-02 ENCOUNTER — Ambulatory Visit: Payer: 59 | Admitting: Dietician

## 2020-05-09 ENCOUNTER — Other Ambulatory Visit: Payer: Self-pay | Admitting: Primary Care

## 2020-05-09 DIAGNOSIS — E119 Type 2 diabetes mellitus without complications: Secondary | ICD-10-CM

## 2020-05-10 ENCOUNTER — Other Ambulatory Visit: Payer: Self-pay | Admitting: Primary Care

## 2020-05-10 ENCOUNTER — Ambulatory Visit: Payer: 59 | Admitting: Primary Care

## 2020-05-10 ENCOUNTER — Other Ambulatory Visit: Payer: Self-pay

## 2020-05-10 ENCOUNTER — Telehealth: Payer: Self-pay | Admitting: Primary Care

## 2020-05-10 VITALS — BP 120/78 | HR 103 | Temp 97.6°F | Ht 69.0 in | Wt 249.0 lb

## 2020-05-10 DIAGNOSIS — R079 Chest pain, unspecified: Secondary | ICD-10-CM | POA: Diagnosis not present

## 2020-05-10 DIAGNOSIS — E119 Type 2 diabetes mellitus without complications: Secondary | ICD-10-CM

## 2020-05-10 DIAGNOSIS — R000 Tachycardia, unspecified: Secondary | ICD-10-CM

## 2020-05-10 LAB — TSH: TSH: 0.87 u[IU]/mL (ref 0.35–4.50)

## 2020-05-10 LAB — CBC
HCT: 30.2 % — ABNORMAL LOW (ref 36.0–46.0)
Hemoglobin: 9.6 g/dL — ABNORMAL LOW (ref 12.0–15.0)
MCHC: 31.7 g/dL (ref 30.0–36.0)
MCV: 70.6 fl — ABNORMAL LOW (ref 78.0–100.0)
Platelets: 233 10*3/uL (ref 150.0–400.0)
RBC: 4.28 Mil/uL (ref 3.87–5.11)
RDW: 14.8 % (ref 11.5–15.5)
WBC: 6 10*3/uL (ref 4.0–10.5)

## 2020-05-10 LAB — IBC + FERRITIN
Ferritin: 31 ng/mL (ref 10.0–291.0)
Iron: 54 ug/dL (ref 42–145)
Saturation Ratios: 12.5 % — ABNORMAL LOW (ref 20.0–50.0)
Transferrin: 309 mg/dL (ref 212.0–360.0)

## 2020-05-10 MED ORDER — METOPROLOL SUCCINATE ER 25 MG PO TB24: 12.5000 mg | ORAL_TABLET | Freq: Every day | ORAL | 0 refills | Status: DC

## 2020-05-10 NOTE — Assessment & Plan Note (Signed)
Diabetes appear to be under better control with glucose readings mentioned today. Continue current regimen.

## 2020-05-10 NOTE — Assessment & Plan Note (Signed)
Acute with two episodes since August 2021. She does have risk factors for CAD including hypertension and diabetes. No FH of CAD/Stroke in parents.   ECG today with Sinus tachycardia with rate of 100. No PAC/PVC, acute ST changes.  Appears similar to ECG from August 2021.  Checking labs today including TSH, CBC, Iron studies. Other labs from August 2021 reviewed. Glucose readings seem to be under much better control. Too soon to repeat A1C today. LDL of 61 in May 2021.  Recommended she resume PPI therapy until seen by cardiology. Also recommended she notify us if symptoms return and/or progress.   Exam today benign.

## 2020-05-10 NOTE — Progress Notes (Signed)
Subjective:    Patient ID: Julia Kline, female    DOB: [birth year], 34 y.o.   MRN: [id number]  HPI  This visit occurred during the SARS-CoV-2 public health emergency.  Safety protocols were in place, including screening questions prior to the visit, additional usage of staff PPE, and extensive cleaning of exam room while observing appropriate contact time as indicated for disinfecting solutions.   Julia Kline is a 34 year old female with a history of migraines, hypertension, type 2 diabetes, obesity, Covid-19 infection, anemia who presents today with a chief complaint of chest pain.  Her chest pain began two mornings ago while making breakfast. Sudden onset that felt like pressure located to the upper substernal region without radiation, nausea/vomiting, diaphoresis. She rested and symptoms resolved. Since then she's noticed intermittent palpitations with chest tightness that occurs with and without exertion.   She presented [facility] ED [facility]/25/20 for complaints of same type of chest pain. Work up included serial troponin levels which were negative. Also other labs without acute process. ECG with NSR with rate of 93, "non specific T wave abnormality" without acute ischemia. Symptoms were suspected to be secondary to GERD given normal work up. She was prescribed PPI therapy.  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 She's been checking her glucose levels several times weekly which are ranging in the 90's ot low 100's. She has an appointment scheduled with cardiology for October 2021.  BP Readings from Last 3 Encounters:  05/10/20 120/78  04/08/20 (!) 138/95  03/25/20 118/85     Review of Systems  Constitutional: Negative for diaphoresis, fatigue and fever.  Eyes: Negative for visual disturbance.  Respiratory: Negative for cough.   Cardiovascular: Positive for chest pain.  Gastrointestinal:        Denies esophageal burning, belching  Neurological: Negative for dizziness.       Past Medical History:  Diagnosis Date  . Acute conjunctivitis of right eye 07/10/2019  . Acute pain of right knee 08/06/2019  . Diabetes mellitus without complication (HCC)   . Eczema   . Hernia cerebri (HCC)   . History of gestational diabetes 05/02/2017   Normal pp testing 10/2017  . History of severe pre-eclampsia 05/02/2017  . Hypertension   . Mastitis 06/18/2019  . Migraine   . PCOS (polycystic ovarian syndrome)   . Pregnancy induced hypertension      Social History   Socioeconomic History  . Marital status: Married    Spouse name: Not on file  . Number of children: Not on file  . Years of education: Not on file  . Highest education level: Not on file  Occupational History  . Not on file  Tobacco Use  . Smoking status: Never Smoker  . Smokeless tobacco: Never Used  Substance and Sexual Activity  . Alcohol use: No  . Drug use: No  . Sexual activity: Yes    Birth control/protection: Injection  Other Topics Concern  . Not on file  Social History Narrative   Married.   2 children.   Works at [employer].   Social Determinants of Health   Financial Resource Strain:   . Difficulty of Paying Living Expenses: Not on file  Food Insecurity:   . Worried About Programme researcher, broadcasting/film/video in the Last Year: Not on file  . Ran Out of Food in the Last Year: Not on  file  Transportation Needs:   . Freight forwarder (Medical): Not on file  . Lack of Transportation (Non-Medical): Not on file  Physical Activity:   . Days of Exercise per Week: Not on file  . Minutes of Exercise per Session: Not on file  Stress:   . Feeling of Stress : Not on file  Social Connections:   . Frequency of Communication with Friends and Family: Not on file  . Frequency of Social Gatherings with Friends and Family: Not on file  . Attends Religious Services: Not on file  . Active Member of Clubs or  Organizations: Not on file  . Attends Banker Meetings: Not on file  . Marital Status: Not on file  Intimate Partner Violence:   . Fear of Current or Ex-Partner: Not on file  . Emotionally Abused: Not on file  . Physically Abused: Not on file  . Sexually Abused: Not on file    Past Surgical History:  Procedure Laterality Date  . CESAREAN SECTION  2017   Procedure: CESAREAN DELIVERY ONLY; Surgeon: Marian Sorrow, MD; Location: [facility]; Service: Obstetrics  . CESAREAN SECTION N/A 10/02/2017   Procedure: CESAREAN SECTION;  Surgeon: Tilda Burrow, MD;  Location: [facility];  Service: Obstetrics;  Laterality: N/A;  . LAPAROSCOPIC OOPHERECTOMY Left   . LEEP    . OVARIAN CYST REMOVAL    . TONSILLECTOMY    . WISDOM TOOTH EXTRACTION      Family History  Problem Relation Age of Onset  . Diabetes Mother   . Migraines Mother   . Diabetes Father   . Hypertension Father   . Hypertension Maternal Grandmother   . Diabetes Maternal Grandmother   . Diabetes Maternal Grandfather   . COPD Maternal Grandfather   . Diabetes Paternal Grandmother   . Hypertension Paternal Grandmother   . Diabetes Paternal Grandfather   . Hypertension Paternal Grandfather   . Heart attack Paternal Grandfather   . Asthma Sister     Allergies  Allergen Reactions  . Ancef [Cefazolin] Hives and Swelling    Facial and neck swelling; Had reaction after receiving fentanyl and ancef in the OR  . Fentanyl Hives and Swelling    Facial and neck swelling; Had reaction after receiving fentanyl and ancef in the OR  . Pseudoephedrine Hcl Other (See Comments)  . Shrimp [Shellfish Allergy] Other (See Comments)    Swelling of throat  . Morphine And Related Rash    Current Outpatient Medications on File Prior to Visit  Medication Sig Dispense Refill  . Dulaglutide (TRULICITY) 1.5 MG/0.5ML SOPN Inject 0.5 mLs (1.5 mg total) into the skin once a week. For diabetes. 2 mL 1  .  medroxyPROGESTERone (DEPO-PROVERA) 150 MG/ML injection Inject 1 mL (150 mg total) into the muscle every 3 (three) months. 1 mL 2  . metFORMIN (GLUCOPHAGE-XR) 500 MG 24 hr tablet TAKE 2 TABLETS BY MOUTH EVERY DAY WITH BREAKFAST FOR DIABETES 180 tablet 0   No current facility-administered medications on file prior to visit.    BP 120/78   Pulse (!) 103   Temp 97.6 F (36.4 C) (Temporal)   Ht 5\' 9"  (1.753 m)   Wt 249 lb (112.9 kg)   SpO2 100%   BMI 36.77 kg/m    Objective:   Physical Exam Cardiovascular:     Rate and Rhythm: Normal rate and regular rhythm.  Pulmonary:     Effort: Pulmonary effort is normal.     Breath  sounds: Normal breath sounds.  Musculoskeletal:     Cervical back: Neck supple.  Skin:    General: Skin is warm and dry.  Psychiatric:        Mood and Affect: Mood normal.            Assessment & Plan:

## 2020-05-10 NOTE — Telephone Encounter (Signed)
Spoke with patient regarding visit today, after further review of ECG, labs I noticed chronic tachycardia over the last year. She does have compensated anemia, follows with GYN who is managing abnormal uterine bleeding. Given tachycardia, coupled with symptoms of chest pain and palpitations, will initiate metoprolol succinate 12.5 mg daily. She agrees. She will monitor HR at home and report drops below 60, and/or if no reduction. She currently runs anywhere between high 90's to low 100's at home.  She will see cardiology as scheduled.

## 2020-05-10 NOTE — Patient Instructions (Signed)
Resume your pantoprazole 20 mg for chest pain/heartburn symptoms.  Stop by the lab prior to leaving today. I will notify you of your results once received.   Follow up with cardiology as scheduled.  It was a pleasure to see you today!

## 2020-05-10 NOTE — Telephone Encounter (Signed)
Called patient have added to be seen today. Reviewed red words if any change or new symptoms before appointment will go to ED

## 2020-05-17 ENCOUNTER — Other Ambulatory Visit: Payer: Self-pay | Admitting: Critical Care Medicine

## 2020-05-17 ENCOUNTER — Other Ambulatory Visit: Payer: 59

## 2020-05-17 DIAGNOSIS — Z20822 Contact with and (suspected) exposure to covid-19: Secondary | ICD-10-CM

## 2020-05-18 LAB — NOVEL CORONAVIRUS, NAA: SARS-CoV-2, NAA: NOT DETECTED

## 2020-05-18 LAB — SARS-COV-2, NAA 2 DAY TAT

## 2020-05-20 ENCOUNTER — Telehealth (INDEPENDENT_AMBULATORY_CARE_PROVIDER_SITE_OTHER): Payer: 59 | Admitting: Internal Medicine

## 2020-05-20 ENCOUNTER — Other Ambulatory Visit: Payer: 59

## 2020-05-20 ENCOUNTER — Encounter: Payer: Self-pay | Admitting: Internal Medicine

## 2020-05-20 VITALS — Ht 69.0 in | Wt 245.0 lb

## 2020-05-20 DIAGNOSIS — R059 Cough, unspecified: Secondary | ICD-10-CM

## 2020-05-20 DIAGNOSIS — G44201 Tension-type headache, unspecified, intractable: Secondary | ICD-10-CM

## 2020-05-20 DIAGNOSIS — J3489 Other specified disorders of nose and nasal sinuses: Secondary | ICD-10-CM | POA: Diagnosis not present

## 2020-05-20 DIAGNOSIS — R6883 Chills (without fever): Secondary | ICD-10-CM

## 2020-05-20 DIAGNOSIS — Z20822 Contact with and (suspected) exposure to covid-19: Secondary | ICD-10-CM

## 2020-05-20 MED ORDER — FREESTYLE LIBRE 14 DAY READER DEVI: 1.0000 "application " | 3 refills | Status: DC

## 2020-05-20 MED ORDER — PREDNISONE 10 MG PO TABS: ORAL_TABLET | ORAL | 0 refills | Status: DC

## 2020-05-20 MED ORDER — FREESTYLE LIBRE 14 DAY SENSOR MISC: 1.0000 | Freq: Once | 1 refills | Status: AC

## 2020-05-20 MED ORDER — PROMETHAZINE-DM 6.25-15 MG/5ML PO SYRP: 5.0000 mL | ORAL_SOLUTION | Freq: Four times a day (QID) | ORAL | 0 refills | Status: DC | PRN

## 2020-05-20 NOTE — Progress Notes (Signed)
Virtual Visit via Video Note  I connected with Julia Kline on 05/20/20 at  3:45 PM EDT by a video enabled telemedicine application and verified that I am speaking with the correct person using two identifiers.  Location: Patient: Home Provider: Office  Person's participating in this video call: Nicki Reaper, NP-C and [employer]   I discussed the limitations of evaluation and management by telemedicine and the availability of in person appointments. The patient expressed understanding and agreed to proceed.  History of Present Illness:  Pt reports headache, runny, nasal congestion, cough and chills. The headache is located forehead and behind her ears. She describes the pain as a dull ache. She is blowing clear mucous out of her nose. The cough is productive of clear mucous. She reports chills but denies fever or body aches. She denies dizziness, visual changes, ear pain, sore throat, loss of taste/smell or SOB. She has had exposure to Covid, had a negative covid test last week, repeated one today with pending results. She has had her Covid vaccines. She has tried Mucinex and Tylenol with minimal relief of symptoms.    Past Medical History:  Diagnosis Date  . Acute conjunctivitis of right eye 07/10/2019  . Acute pain of right knee 08/06/2019  . Diabetes mellitus without complication (HCC)   . Eczema   . Hernia cerebri (HCC)   . History of gestational diabetes 05/02/2017   Normal pp testing 10/2017  . History of severe pre-eclampsia 05/02/2017  . Hypertension   . Mastitis 06/18/2019  . Migraine   . PCOS (polycystic ovarian syndrome)   . Pregnancy induced hypertension     Current Outpatient Medications  Medication Sig Dispense Refill  . Dulaglutide (TRULICITY) 1.5 MG/0.5ML SOPN Inject 0.5 mLs (1.5 mg total) into the skin once a week. For diabetes. 2 mL 1  . medroxyPROGESTERone (DEPO-PROVERA) 150 MG/ML injection Inject 1 mL (150 mg total) into the muscle every 3 (three)  months. 1 mL 2  . metFORMIN (GLUCOPHAGE-XR) 500 MG 24 hr tablet TAKE 2 TABLETS BY MOUTH EVERY DAY WITH BREAKFAST FOR DIABETES 180 tablet 0  . metoprolol succinate (TOPROL-XL) 25 MG 24 hr tablet Take 0.5 tablets (12.5 mg total) by mouth daily. For heartrate. 30 tablet 0   No current facility-administered medications for this visit.    Allergies  Allergen Reactions  . Ancef [Cefazolin] Hives and Swelling    Facial and neck swelling; Had reaction after receiving fentanyl and ancef in the OR  . Fentanyl Hives and Swelling    Facial and neck swelling; Had reaction after receiving fentanyl and ancef in the OR  . Pseudoephedrine Hcl Other (See Comments)  . Shrimp [Shellfish Allergy] Other (See Comments)    Swelling of throat  . Morphine And Related Rash    Family History  Problem Relation Age of Onset  . Diabetes Mother   . Migraines Mother   . Diabetes Father   . Hypertension Father   . Hypertension Maternal Grandmother   . Diabetes Maternal Grandmother   . Diabetes Maternal Grandfather   . COPD Maternal Grandfather   . Diabetes Paternal Grandmother   . Hypertension Paternal Grandmother   . Diabetes Paternal Grandfather   . Hypertension Paternal Grandfather   . Heart attack Paternal Grandfather   . Asthma Sister     Social History   Socioeconomic History  . Marital status: Married    Spouse name: Not on file  . Number of children: Not on file  . Years of  education: Not on file  . Highest education level: Not on file  Occupational History  . Not on file  Tobacco Use  . Smoking status: Never Smoker  . Smokeless tobacco: Never Used  Substance and Sexual Activity  . Alcohol use: No  . Drug use: No  . Sexual activity: Yes    Birth control/protection: Injection  Other Topics Concern  . Not on file  Social History Narrative   Married.   2 children.   Works at [employer].   Social Determinants of Health   Financial Resource Strain:   . Difficulty of Paying  Living Expenses: Not on file  Food Insecurity:   . Worried About Programme researcher, broadcasting/film/video in the Last Year: Not on file  . Ran Out of Food in the Last Year: Not on file  Transportation Needs:   . Lack of Transportation (Medical): Not on file  . Lack of Transportation (Non-Medical): Not on file  Physical Activity:   . Days of Exercise per Week: Not on file  . Minutes of Exercise per Session: Not on file  Stress:   . Feeling of Stress : Not on file  Social Connections:   . Frequency of Communication with Friends and Family: Not on file  . Frequency of Social Gatherings with Friends and Family: Not on file  . Attends Religious Services: Not on file  . Active Member of Clubs or Organizations: Not on file  . Attends Banker Meetings: Not on file  . Marital Status: Not on file  Intimate Partner Violence:   . Fear of Current or Ex-Partner: Not on file  . Emotionally Abused: Not on file  . Physically Abused: Not on file  . Sexually Abused: Not on file     Constitutional: Pt reports headache, chills. Denies fever, malaise, fatigue, or abrupt weight changes.  HEENT: Pt reports runny nose, nasal congestion. Denies eye pain, eye redness, ear pain, ringing in the ears, wax buildup, runny nose, bloody nose, or sore throat. Respiratory: Pt reports cough. Denies difficulty breathing, shortness of breath.   Cardiovascular: Denies chest pain, chest tightness, palpitations or swelling in the hands or feet.    No other specific complaints in a complete review of systems (except as listed in HPI above).    Observations/Objective: Ht 5\' 9"  (1.753 m)   Wt 245 lb (111.1 kg)   BMI 36.18 kg/m   Wt Readings from Last 3 Encounters:  05/10/20 249 lb (112.9 kg)  04/20/20 252 lb (114.3 kg)  03/25/20 252 lb (114.3 kg)    General: Appears her stated age, obese, ill appearing in NAD. HEENT: Head: normal shape and size; Nose: congestion noted ; Throat/Mouth: hoarseness noted Pulmonary/Chest:  Normal effort. No cough noted. No respiratory distress.   Neurological: Alert and oriented.    BMET    Component Value Date/Time   NA 137 03/25/2020 1513   NA 145 (H) 10/09/2017 1500   K 4.1 03/25/2020 1513   CL 101 03/25/2020 1513   CO2 24 03/25/2020 1513   GLUCOSE 131 (H) 03/25/2020 1513   BUN 10 03/25/2020 1513   BUN 8 10/09/2017 1500   CREATININE 0.66 03/25/2020 1513   CALCIUM 10.1 03/25/2020 1513   GFRNONAA >60 03/25/2020 1513   GFRAA >60 03/25/2020 1513    Lipid Panel     Component Value Date/Time   CHOL 109 12/21/2019 1228   TRIG 256.0 (H) 12/21/2019 1228   HDL 28.10 (L) 12/21/2019 1228   CHOLHDL  4 12/21/2019 1228   VLDL 51.2 (H) 12/21/2019 1228    CBC    Component Value Date/Time   WBC 6.0 05/10/2020 1018   RBC 4.28 05/10/2020 1018   HGB 9.6 (L) 05/10/2020 1018   HGB 9.9 (L) 10/13/2019 1605   HGB 10.8 04/01/2017 0000   HCT 30.2 (L) 05/10/2020 1018   HCT 31.7 (L) 10/13/2019 1605   HCT 34 04/01/2017 0000   PLT 233.0 05/10/2020 1018   PLT 296 10/13/2019 1605   PLT 252 04/01/2017 0000   MCV 70.6 (L) 05/10/2020 1018   MCV 72 (L) 10/13/2019 1605   MCH 22.0 (L) 03/25/2020 1513   MCHC 31.7 05/10/2020 1018   RDW 14.8 05/10/2020 1018   RDW 14.3 10/13/2019 1605   LYMPHSABS 1.5 09/28/2019 1732   MONOABS 0.4 09/28/2019 1732   EOSABS 0.0 09/28/2019 1732   BASOSABS 0.0 09/28/2019 1732    Hgb A1C Lab Results  Component Value Date   HGBA1C 9.2 (A) 03/22/2020        Assessment and Plan:  Acute Headache, Runny Nose, Nasal Congestion, Cough and Chills:  Could be covid, repeat test pending No indication for abx at this time RX for Pred taper x 6 days- monitor sugars RX for Hycodan for cough- sedation caution given Encouraged rest and fluids Continue Tylenol as needed Discussed the importance of self quarantine, masking, frequent handwashing and social distancing even in the home  Return precautions discussed  Follow Up Instructions:    I  discussed the assessment and treatment plan with the patient. The patient was provided an opportunity to ask questions and all were answered. The patient agreed with the plan and demonstrated an understanding of the instructions.   The patient was advised to call back or seek an in-person evaluation if the symptoms worsen or if the condition fails to improve as anticipated.    Nicki Reaper, NP

## 2020-05-20 NOTE — Patient Instructions (Signed)

## 2020-05-22 LAB — SPECIMEN STATUS REPORT

## 2020-05-22 LAB — SARS-COV-2, NAA 2 DAY TAT

## 2020-05-22 LAB — NOVEL CORONAVIRUS, NAA: SARS-CoV-2, NAA: NOT DETECTED

## 2020-05-24 ENCOUNTER — Other Ambulatory Visit: Payer: Self-pay

## 2020-05-24 ENCOUNTER — Encounter: Payer: Self-pay | Admitting: Internal Medicine

## 2020-05-24 ENCOUNTER — Ambulatory Visit: Payer: 59 | Admitting: Internal Medicine

## 2020-05-24 VITALS — BP 126/84 | HR 86 | Temp 98.1°F | Wt 246.0 lb

## 2020-05-24 DIAGNOSIS — E1100 Type 2 diabetes mellitus with hyperosmolarity without nonketotic hyperglycemic-hyperosmolar coma (NKHHC): Secondary | ICD-10-CM

## 2020-05-24 DIAGNOSIS — R059 Cough, unspecified: Secondary | ICD-10-CM | POA: Diagnosis not present

## 2020-05-24 DIAGNOSIS — Z92241 Personal history of systemic steroid therapy: Secondary | ICD-10-CM | POA: Diagnosis not present

## 2020-05-24 DIAGNOSIS — R5383 Other fatigue: Secondary | ICD-10-CM | POA: Diagnosis not present

## 2020-05-24 NOTE — Telephone Encounter (Signed)
I spoke with pt; at 4;56 this morning FBS 270 and at 6:30 FBS is 315. Pt takes trulicity on Weds. Pt has not eaten yet so will eat and then take metformin 500 mg taking 2 tabs at breakfast. Prednisone was started on 05/21/20. Right now pt is feeling no energy and tired. Was sweating earlier. Advised pt to drink  More water. FBS now is 204. Pt has slight prod cough with clear phlegm about 3 - 4 times a day but no other covid symptoms. I spoke with Pamala Hurry NP and she said OK for pt to come into office. Pt has had neg covid test and has had both pfizer covid vaccines.  Pt scheduled to see Pamala Hurry NP today at 12:15. UC & ED precautions given and pt voiced understanding.

## 2020-05-24 NOTE — Telephone Encounter (Signed)
[facility] Night - Client TELEPHONE ADVICE RECORD AccessNurse Patient Name: Julia Kline Gender: Female DOB: 07-12-1986 Age: 34 Y 7 M 10 D Return Phone Number: [phone] (Primary) [place] [address] Client [facility] Night - Client Client Site [facility] - Night Physician Vernona Rieger - NP Contact Type Call Who Is Calling Patient / Member / Family / Caregiver Call Type Triage / Clinical Relationship To Patient Self Return Phone Number [phone] (Primary) Chief Complaint Blood Sugar High Reason for Call Symptomatic / Request for Health Information Initial Comment Caller states she is concerned about her glucose level it is currently 329. Translation No Nurse Assessment Nurse: Allie Bossier, RN, [place] Date/Time (Eastern Time): 05/23/2020 10:21:09 PM Confirm and document reason for call. If symptomatic, describe symptoms. ---Caller states her glucose is high. 329 is last reading. States she is hot, sweaty, and tired. Started a new medication one is a cough syrup and the other one is a steroid. Was given it for head and chest congestion. Does the patient have any new or worsening symptoms? ---Yes Will a triage be completed? ---Yes Related visit to physician within the last 2 weeks? ---Yes Does the PT have any chronic conditions? (i.e. diabetes, asthma, this includes High risk factors for pregnancy, etc.) ---Yes List chronic conditions. ---DM Is the patient pregnant or possibly pregnant? (Ask all females between the ages of 72-55) ---No Is this a behavioral health or substance abuse call? ---No Guidelines Guideline Title Affirmed Question Affirmed Notes Nurse Date/Time (Eastern Time) Diabetes - High Blood Sugar [1] Blood glucose > 300 mg/dL (12.7 mmol/L) AND [5] does not use insulin (e.g., not insulindependent; most people with type 2 diabetes) Allie Bossier, RN,  [place] 05/23/2020 10:23:11 PM Disp. Time Lamount Cohen Time) Disposition Final User 05/23/2020 10:26:26 PM Home Care Yes Allie Bossier, RN, [place] PLEASE NOTE: All timestamps contained within this report are represented as [place] Standard Time. CONFIDENTIALTY NOTICE: This fax transmission is intended only for the addressee. It contains information that is legally privileged, confidential or otherwise protected from use or disclosure. If you are not the intended recipient, you are strictly prohibited from reviewing, disclosing, copying using or disseminating any of this information or taking any action in reliance on or regarding this information. If you have received this fax in error, please notify us immediately by telephone so that we can arrange for its return to [place]. Phone: [phone], Toll-Free: [phone], Fax: [phone] Page: 2 of 2 Call Id: [id number] Caller Disagree/Comply Comply Caller Understands Yes PreDisposition Call Doctor Care Advice Given Per Guideline * You should be able to treat this at home. * Drink at least one glass (8 oz; 240 ml) of water per hour for the next 4 hours. Reason: Adequate hydration will help lower blood sugar. TREATMENT - LIQUIDS: CONTINUE DIABETES PILLS: MEASURE AND RECORD YOUR BLOOD GLUCOSE: * Measure your blood glucose before breakfast and before going to bed. * Blood glucose over 300 mg/dL (09.2 mmol/L), two or more times in a row. CALL BACK IF: * Rapid breathing occurs * You have more questions * You become worse * Vomiting lasting over 4 hours or unable to drink any fluids

## 2020-05-24 NOTE — Progress Notes (Signed)
Subjective:    Patient ID: Julia Kline, female    DOB: Feb 23, 1986, 34 y.o.   MRN: [id number]  HPI  Pt presents to the clinic today with c/o elevated blood sugar. She reports this mornign it was 270 and a few hours later, it was 315. It has been as high as 345. She takes Metformin and Trulcity as prescribed. She was started on Prednisone 10/1 for URI symptoms. She reports improvement in symptoms but stills has fatigue and a cough. She has had 2 neigative Covid tests 9/28 and 10/1.  Review of Systems        Past Medical History:  Diagnosis Date  . Acute conjunctivitis of right eye 07/10/2019  . Acute pain of right knee 08/06/2019  . Diabetes mellitus without complication (HCC)   . Eczema   . Hernia cerebri (HCC)   . History of gestational diabetes 05/02/2017   Normal pp testing 10/2017  . History of severe pre-eclampsia 05/02/2017  . Hypertension   . Mastitis 06/18/2019  . Migraine   . PCOS (polycystic ovarian syndrome)   . Pregnancy induced hypertension     Current Outpatient Medications  Medication Sig Dispense Refill  . Continuous Blood Gluc Receiver (FREESTYLE LIBRE 14 DAY READER) DEVI 1 application by Does not apply route every 14 (fourteen) days. 6 each 3  . Dulaglutide (TRULICITY) 1.5 MG/0.5ML SOPN Inject 0.5 mLs (1.5 mg total) into the skin once a week. For diabetes. 2 mL 1  . medroxyPROGESTERone (DEPO-PROVERA) 150 MG/ML injection Inject 1 mL (150 mg total) into the muscle every 3 (three) months. 1 mL 2  . metFORMIN (GLUCOPHAGE-XR) 500 MG 24 hr tablet TAKE 2 TABLETS BY MOUTH EVERY DAY WITH BREAKFAST FOR DIABETES 180 tablet 0  . metoprolol succinate (TOPROL-XL) 25 MG 24 hr tablet Take 0.5 tablets (12.5 mg total) by mouth daily. For heartrate. 30 tablet 0  . predniSONE (DELTASONE) 10 MG tablet Take 6 tabs day 1, 5 tabs day 2, 4 tabs day 3, 3 tabs day 4, 2 tabs day 5, 1 tab day 6 21 tablet 0  . promethazine-dextromethorphan (PROMETHAZINE-DM) 6.25-15 MG/5ML syrup Take  5 mLs by mouth 4 (four) times daily as needed. 118 mL 0   No current facility-administered medications for this visit.    Allergies  Allergen Reactions  . Ancef [Cefazolin] Hives and Swelling    Facial and neck swelling; Had reaction after receiving fentanyl and ancef in the OR  . Fentanyl Hives and Swelling    Facial and neck swelling; Had reaction after receiving fentanyl and ancef in the OR  . Pseudoephedrine Hcl Other (See Comments)  . Shrimp [Shellfish Allergy] Other (See Comments)    Swelling of throat  . Morphine And Related Rash    Family History  Problem Relation Age of Onset  . Diabetes Mother   . Migraines Mother   . Diabetes Father   . Hypertension Father   . Hypertension Maternal Grandmother   . Diabetes Maternal Grandmother   . Diabetes Maternal Grandfather   . COPD Maternal Grandfather   . Diabetes Paternal Grandmother   . Hypertension Paternal Grandmother   . Diabetes Paternal Grandfather   . Hypertension Paternal Grandfather   . Heart attack Paternal Grandfather   . Asthma Sister     Social History   Socioeconomic History  . Marital status: Married    Spouse name: Not on file  . Number of children: Not on file  . Years of education: Not on file  .  Highest education level: Not on file  Occupational History  . Not on file  Tobacco Use  . Smoking status: Never Smoker  . Smokeless tobacco: Never Used  Substance and Sexual Activity  . Alcohol use: No  . Drug use: No  . Sexual activity: Yes    Birth control/protection: Injection  Other Topics Concern  . Not on file  Social History Narrative   Married.   2 children.   Works at [employer].   Social Determinants of Health   Financial Resource Strain:   . Difficulty of Paying Living Expenses: Not on file  Food Insecurity:   . Worried About Programme researcher, broadcasting/film/video in the Last Year: Not on file  . Ran Out of Food in the Last Year: Not on file  Transportation Needs:   . Lack of Transportation  (Medical): Not on file  . Lack of Transportation (Non-Medical): Not on file  Physical Activity:   . Days of Exercise per Week: Not on file  . Minutes of Exercise per Session: Not on file  Stress:   . Feeling of Stress : Not on file  Social Connections:   . Frequency of Communication with Friends and Family: Not on file  . Frequency of Social Gatherings with Friends and Family: Not on file  . Attends Religious Services: Not on file  . Active Member of Clubs or Organizations: Not on file  . Attends Banker Meetings: Not on file  . Marital Status: Not on file  Intimate Partner Violence:   . Fear of Current or Ex-Partner: Not on file  . Emotionally Abused: Not on file  . Physically Abused: Not on file  . Sexually Abused: Not on file     Constitutional: Pt reports fatigue. enies fever, malaise, headache or abrupt weight changes.  HEENT: Denies eye pain, eye redness, ear pain, ringing in the ears, wax buildup, runny nose, nasal congestion, bloody nose, or sore throat. Respiratory: Pt reports cough. Denies difficulty breathing, shortness of breath, or sputum production.   Cardiovascular: Denies chest pain, chest tightness, palpitations or swelling in the hands or feet.  Gastrointestinal: Pt reports increased thirst. Denies abdominal pain, bloating, constipation, diarrhea or blood in the stool.  Skin: Denies redness, rashes, lesions or ulcercations.  Neurological: Denies dizziness, difficulty with memory, difficulty with speech or problems with balance and coordination.   No other specific complaints in a complete review of systems (except as listed in HPI above).  Objective:   Physical Exam BP 126/84   Pulse 86   Temp 98.1 F (36.7 C) (Temporal)   Wt 246 lb (111.6 kg)   SpO2 98%   BMI 36.33 kg/m   Wt Readings from Last 3 Encounters:  05/20/20 245 lb (111.1 kg)  05/10/20 249 lb (112.9 kg)  04/20/20 252 lb (114.3 kg)    General: Appears her stated age, obese, in  NAD. HEENT: Head: normal shape and size; Eyes: PERRLA and EOMs intact; Cardiovascular: Normal rate. Pulmonary/Chest: Normal effort and positive vesicular breath sounds.  Musculoskeletal:. No difficulty with gait.  Neurological: Alert and oriented.    BMET    Component Value Date/Time   NA 137 03/25/2020 1513   NA 145 (H) 10/09/2017 1500   K 4.1 03/25/2020 1513   CL 101 03/25/2020 1513   CO2 24 03/25/2020 1513   GLUCOSE 131 (H) 03/25/2020 1513   BUN 10 03/25/2020 1513   BUN 8 10/09/2017 1500   CREATININE 0.66 03/25/2020 1513   CALCIUM  10.1 03/25/2020 1513   GFRNONAA >60 03/25/2020 1513   GFRAA >60 03/25/2020 1513    Lipid Panel     Component Value Date/Time   CHOL 109 12/21/2019 1228   TRIG 256.0 (H) 12/21/2019 1228   HDL 28.10 (L) 12/21/2019 1228   CHOLHDL 4 12/21/2019 1228   VLDL 51.2 (H) 12/21/2019 1228    CBC    Component Value Date/Time   WBC 6.0 05/10/2020 1018   RBC 4.28 05/10/2020 1018   HGB 9.6 (L) 05/10/2020 1018   HGB 9.9 (L) 10/13/2019 1605   HGB 10.8 04/01/2017 0000   HCT 30.2 (L) 05/10/2020 1018   HCT 31.7 (L) 10/13/2019 1605   HCT 34 04/01/2017 0000   PLT 233.0 05/10/2020 1018   PLT 296 10/13/2019 1605   PLT 252 04/01/2017 0000   MCV 70.6 (L) 05/10/2020 1018   MCV 72 (L) 10/13/2019 1605   MCH 22.0 (L) 03/25/2020 1513   MCHC 31.7 05/10/2020 1018   RDW 14.8 05/10/2020 1018   RDW 14.3 10/13/2019 1605   LYMPHSABS 1.5 09/28/2019 1732   MONOABS 0.4 09/28/2019 1732   EOSABS 0.0 09/28/2019 1732   BASOSABS 0.0 09/28/2019 1732    Hgb A1C Lab Results  Component Value Date   HGBA1C 9.2 (A) 03/22/2020            Assessment & Plan:  Hyperglycemia secondary Steroid Use:  Advised her that this was normal with Prednisone She will take an extra Metformin for the nex3 days in the evening, until she finishes Prednisone Encouraged low carb diet She questions wether she should take insulin for a few days but I do not think this is necessary She  is due for repeat A1C in November, already has appt scheduled  Fatigue, Cough:  Improving on Prednisone Will continue to monitor  Return precautions discussed  Nicki Reaper, NP This visit occurred during the SARS-CoV-2 public health emergency.  Safety protocols were in place, including screening questions prior to the visit, additional usage of staff PPE, and extensive cleaning of exam room while observing appropriate contact time as indicated for disinfecting solutions.

## 2020-05-24 NOTE — Patient Instructions (Signed)

## 2020-05-31 ENCOUNTER — Other Ambulatory Visit: Payer: Self-pay | Admitting: Primary Care

## 2020-05-31 DIAGNOSIS — R000 Tachycardia, unspecified: Secondary | ICD-10-CM

## 2020-06-06 ENCOUNTER — Other Ambulatory Visit: Payer: Self-pay | Admitting: Primary Care

## 2020-06-06 DIAGNOSIS — E119 Type 2 diabetes mellitus without complications: Secondary | ICD-10-CM

## 2020-06-14 ENCOUNTER — Ambulatory Visit: Payer: 59 | Admitting: Cardiology

## 2020-06-23 ENCOUNTER — Other Ambulatory Visit: Payer: Self-pay | Admitting: Primary Care

## 2020-06-23 DIAGNOSIS — E119 Type 2 diabetes mellitus without complications: Secondary | ICD-10-CM

## 2020-06-24 ENCOUNTER — Ambulatory Visit: Payer: 59

## 2020-06-30 ENCOUNTER — Other Ambulatory Visit: Payer: Self-pay

## 2020-06-30 ENCOUNTER — Encounter: Payer: Self-pay | Admitting: Primary Care

## 2020-06-30 ENCOUNTER — Ambulatory Visit: Payer: 59 | Admitting: Primary Care

## 2020-06-30 VITALS — BP 134/82 | HR 98 | Temp 98.0°F | Ht 69.0 in | Wt 245.0 lb

## 2020-06-30 DIAGNOSIS — E1100 Type 2 diabetes mellitus with hyperosmolarity without nonketotic hyperglycemic-hyperosmolar coma (NKHHC): Secondary | ICD-10-CM | POA: Diagnosis not present

## 2020-06-30 DIAGNOSIS — E119 Type 2 diabetes mellitus without complications: Secondary | ICD-10-CM

## 2020-06-30 LAB — POCT GLYCOSYLATED HEMOGLOBIN (HGB A1C): Hemoglobin A1C: 7.5 % — AB (ref 4.0–5.6)

## 2020-06-30 MED ORDER — METFORMIN HCL ER 500 MG PO TB24: ORAL_TABLET | ORAL | 11 refills | Status: DC

## 2020-06-30 MED ORDER — TRULICITY 3 MG/0.5ML ~~LOC~~ SOAJ: 3.0000 mg | SUBCUTANEOUS | 5 refills | Status: DC

## 2020-06-30 NOTE — Patient Instructions (Signed)
Start exercising. You should be getting 150 minutes of moderate intensity exercise weekly.  It is important that you improve your diet. Please limit carbohydrates in the form of white bread, rice, pasta, sweets, fast food, fried food, sugary drinks, etc. Increase your consumption of fresh fruits and vegetables, whole grains, lean protein.  Ensure you are consuming 64 ounces of water daily.  We increased the dose of your Trulicity to 3 mg. Continue to inject once weekly.  Continue Metformin as prescribed.  Please schedule a follow up appointment in 3 months for diabetes check.  It was a pleasure to see you today!   Diabetes Mellitus and Nutrition, Adult When you have diabetes (diabetes mellitus), it is very important to have healthy eating habits because your blood sugar (glucose) levels are greatly affected by what you eat and drink. Eating healthy foods in the appropriate amounts, at about the same times every day, can help you:  Control your blood glucose.  Lower your risk of heart disease.  Improve your blood pressure.  Reach or maintain a healthy weight. Every person with diabetes is different, and each person has different needs for a meal plan. Your health care provider may recommend that you work with a diet and nutrition specialist (dietitian) to make a meal plan that is best for you. Your meal plan may vary depending on factors such as:  The calories you need.  The medicines you take.  Your weight.  Your blood glucose, blood pressure, and cholesterol levels.  Your activity level.  Other health conditions you have, such as heart or kidney disease. How do carbohydrates affect me? Carbohydrates, also called carbs, affect your blood glucose level more than any other type of food. Eating carbs naturally raises the amount of glucose in your blood. Carb counting is a method for keeping track of how many carbs you eat. Counting carbs is important to keep your blood glucose at  a healthy level, especially if you use insulin or take certain oral diabetes medicines. It is important to know how many carbs you can safely have in each meal. This is different for every person. Your dietitian can help you calculate how many carbs you should have at each meal and for each snack. Foods that contain carbs include:  Bread, cereal, rice, pasta, and crackers.  Potatoes and corn.  Peas, beans, and lentils.  Milk and yogurt.  Fruit and juice.  Desserts, such as cakes, cookies, ice cream, and candy. How does alcohol affect me? Alcohol can cause a sudden decrease in blood glucose (hypoglycemia), especially if you use insulin or take certain oral diabetes medicines. Hypoglycemia can be a life-threatening condition. Symptoms of hypoglycemia (sleepiness, dizziness, and confusion) are similar to symptoms of having too much alcohol. If your health care provider says that alcohol is safe for you, follow these guidelines:  Limit alcohol intake to no more than 1 drink per day for nonpregnant women and 2 drinks per day for men. One drink equals 12 oz of beer, 5 oz of wine, or 1 oz of hard liquor.  Do not drink on an empty stomach.  Keep yourself hydrated with water, diet soda, or unsweetened iced tea.  Keep in mind that regular soda, juice, and other mixers may contain a lot of sugar and must be counted as carbs. What are tips for following this plan?  Reading food labels  Start by checking the serving size on the "Nutrition Facts" label of packaged foods and drinks. The amount of  calories, carbs, fats, and other nutrients listed on the label is based on one serving of the item. Many items contain more than one serving per package.  Check the total grams (g) of carbs in one serving. You can calculate the number of servings of carbs in one serving by dividing the total carbs by 15. For example, if a food has 30 g of total carbs, it would be equal to 2 servings of carbs.  Check the  number of grams (g) of saturated and trans fats in one serving. Choose foods that have low or no amount of these fats.  Check the number of milligrams (mg) of salt (sodium) in one serving. Most people should limit total sodium intake to less than 2,300 mg per day.  Always check the nutrition information of foods labeled as "low-fat" or "nonfat". These foods may be higher in added sugar or refined carbs and should be avoided.  Talk to your dietitian to identify your daily goals for nutrients listed on the label. Shopping  Avoid buying canned, premade, or processed foods. These foods tend to be high in fat, sodium, and added sugar.  Shop around the outside edge of the grocery store. This includes fresh fruits and vegetables, bulk grains, fresh meats, and fresh dairy. Cooking  Use low-heat cooking methods, such as baking, instead of high-heat cooking methods like deep frying.  Cook using healthy oils, such as olive, canola, or sunflower oil.  Avoid cooking with butter, cream, or high-fat meats. Meal planning  Eat meals and snacks regularly, preferably at the same times every day. Avoid going long periods of time without eating.  Eat foods high in fiber, such as fresh fruits, vegetables, beans, and whole grains. Talk to your dietitian about how many servings of carbs you can eat at each meal.  Eat 4-6 ounces (oz) of lean protein each day, such as lean meat, chicken, fish, eggs, or tofu. One oz of lean protein is equal to: ? 1 oz of meat, chicken, or fish. ? 1 egg. ?  cup of tofu.  Eat some foods each day that contain healthy fats, such as avocado, nuts, seeds, and fish. Lifestyle  Check your blood glucose regularly.  Exercise regularly as told by your health care provider. This may include: ? 150 minutes of moderate-intensity or vigorous-intensity exercise each week. This could be brisk walking, biking, or water aerobics. ? Stretching and doing strength exercises, such as yoga or  weightlifting, at least 2 times a week.  Take medicines as told by your health care provider.  Do not use any products that contain nicotine or tobacco, such as cigarettes and e-cigarettes. If you need help quitting, ask your health care provider.  Work with a Veterinary surgeon or diabetes educator to identify strategies to manage stress and any emotional and social challenges. Questions to ask a health care provider  Do I need to meet with a diabetes educator?  Do I need to meet with a dietitian?  What number can I call if I have questions?  When are the best times to check my blood glucose? Where to find more information:  American Diabetes Association: diabetes.org  Academy of Nutrition and Dietetics: www.eatright.[employer] of Diabetes and Digestive and Kidney Diseases (NIH): CarFlippers.tn Summary  A healthy meal plan will help you control your blood glucose and maintain a healthy lifestyle.  Working with a diet and nutrition specialist (dietitian) can help you make a meal plan that is best for you.  Keep in mind that carbohydrates (carbs) and alcohol have immediate effects on your blood glucose levels. It is important to count carbs and to use alcohol carefully. This information is not intended to replace advice given to you by your health care provider. Make sure you discuss any questions you have with your health care provider. Document Revised: 07/19/2017 Document Reviewed: 09/10/2016 Elsevier Patient Education  2020 [employer].

## 2020-06-30 NOTE — Progress Notes (Signed)
Subjective:    Patient ID: [name], female    DOB: 07-10-1986, 34 y.o.   MRN: [id number]  HPI  This visit occurred during the SARS-CoV-2 public health emergency.  Safety protocols were in place, including screening questions prior to the visit, additional usage of staff PPE, and extensive cleaning of exam room while observing appropriate contact time as indicated for disinfecting solutions.   Ms. Prien is a 34 year old female with a history of hypertension, type 2 diabetes, anemia, abnormal uterine bleeding who presents today for follow up of diabetes.  Current medications include: Metformin XR 1000 mg daily, Trulicity 1.5 mg weekly.  She is checking her blood glucose 0 times daily as she cannot afford to refill FreeStyle Libre now. She does not like pricking her finger for readings.   Last A1C: 9.2 in August 2021, 7.5 today. Last Eye Exam: She is scheduled for December 2021 Last Foot Exam: UTD Pneumonia Vaccination: Declines ACE/ARB: None. Urine micro negative in May 2021 Statin: None.  BP Readings from Last 3 Encounters:  06/30/20 134/82  05/24/20 126/84  05/10/20 120/78   Wt Readings from Last 3 Encounters:  06/30/20 245 lb (111.1 kg)  05/24/20 246 lb (111.6 kg)  05/20/20 245 lb (111.1 kg)   She is watching her diet, has been reducing sugar intake in drinks. She is not exercising. She has been under an increased amount of stress with her personal life and finishing school.   Review of Systems  Eyes: Negative for visual disturbance.  Respiratory: Negative for shortness of breath.   Cardiovascular: Negative for chest pain.  Neurological: Negative for numbness.       Past Medical History:  Diagnosis Date  . Acute conjunctivitis of right eye 07/10/2019  . Acute pain of right knee 08/06/2019  . Diabetes mellitus without complication (HCC)   . Eczema   . Hernia cerebri (HCC)   . History of gestational diabetes 05/02/2017   Normal pp testing 10/2017  .  History of severe pre-eclampsia 05/02/2017  . Hypertension   . Mastitis 06/18/2019  . Migraine   . PCOS (polycystic ovarian syndrome)   . Pregnancy induced hypertension      Social History   Socioeconomic History  . Marital status: Married    Spouse name: Not on file  . Number of children: Not on file  . Years of education: Not on file  . Highest education level: Not on file  Occupational History  . Not on file  Tobacco Use  . Smoking status: Never Smoker  . Smokeless tobacco: Never Used  Substance and Sexual Activity  . Alcohol use: No  . Drug use: No  . Sexual activity: Yes    Birth control/protection: Injection  Other Topics Concern  . Not on file  Social History Narrative   Married.   2 children.   Works at [employer].   Social Determinants of Health   Financial Resource Strain:   . Difficulty of Paying Living Expenses: Not on file  Food Insecurity:   . Worried About Programme researcher, broadcasting/film/video in the Last Year: Not on file  . Ran Out of Food in the Last Year: Not on file  Transportation Needs:   . Lack of Transportation (Medical): Not on file  . Lack of Transportation (Non-Medical): Not on file  Physical Activity:   . Days of Exercise per Week: Not on file  . Minutes of Exercise per Session: Not on file  Stress:   .  Feeling of Stress : Not on file  Social Connections:   . Frequency of Communication with Friends and Family: Not on file  . Frequency of Social Gatherings with Friends and Family: Not on file  . Attends Religious Services: Not on file  . Active Member of Clubs or Organizations: Not on file  . Attends Banker Meetings: Not on file  . Marital Status: Not on file  Intimate Partner Violence:   . Fear of Current or Ex-Partner: Not on file  . Emotionally Abused: Not on file  . Physically Abused: Not on file  . Sexually Abused: Not on file    Past Surgical History:  Procedure Laterality Date  . CESAREAN SECTION  2017   Procedure:  CESAREAN DELIVERY ONLY; Surgeon: Marian Sorrow, MD; Location: [facility]CTION [facility]; Service: Obstetrics  . CESAREAN SECTION N/A 10/02/2017   Procedure: CESAREAN SECTION;  Surgeon: Tilda Burrow, MD;  Location: [facility] BIRTHING SUITES;  Service: Obstetrics;  Laterality: N/A;  . LAPAROSCOPIC OOPHERECTOMY Left   . LEEP    . OVARIAN CYST REMOVAL    . TONSILLECTOMY    . WISDOM TOOTH EXTRACTION      Family History  Problem Relation Age of Onset  . Diabetes Mother   . Migraines Mother   . Diabetes Father   . Hypertension Father   . Hypertension Maternal Grandmother   . Diabetes Maternal Grandmother   . Diabetes Maternal Grandfather   . COPD Maternal Grandfather   . Diabetes Paternal Grandmother   . Hypertension Paternal Grandmother   . Diabetes Paternal Grandfather   . Hypertension Paternal Grandfather   . Heart attack Paternal Grandfather   . Asthma Sister     Allergies  Allergen Reactions  . Ancef [Cefazolin] Hives and Swelling    Facial and neck swelling; Had reaction after receiving fentanyl and ancef in the OR  . Fentanyl Hives and Swelling    Facial and neck swelling; Had reaction after receiving fentanyl and ancef in the OR  . Pseudoephedrine Hcl Other (See Comments)  . Shrimp [Shellfish Allergy] Other (See Comments)    Swelling of throat  . Morphine And Related Rash    Current Outpatient Medications on File Prior to Visit  Medication Sig Dispense Refill  . Continuous Blood Gluc Receiver (FREESTYLE LIBRE 14 DAY READER) DEVI 1 application by Does not apply route every 14 (fourteen) days. 6 each 3  . medroxyPROGESTERone (DEPO-PROVERA) 150 MG/ML injection Inject 1 mL (150 mg total) into the muscle every 3 (three) months. 1 mL 2   No current facility-administered medications on file prior to visit.    BP 134/82   Pulse 98   Temp 98 F (36.7 C) (Temporal)   Ht 5\' 9"  (1.753 m)   Wt 245 lb (111.1 kg)   SpO2 100%   BMI 36.18 kg/m    Objective:   Physical  Exam Cardiovascular:     Rate and Rhythm: Normal rate and regular rhythm.  Pulmonary:     Effort: Pulmonary effort is normal.     Breath sounds: Normal breath sounds.  Musculoskeletal:     Cervical back: Neck supple.  Skin:    General: Skin is warm and dry.            Assessment & Plan:

## 2020-06-30 NOTE — Assessment & Plan Note (Signed)
A1C improved to 7.5 from 9.2, commended her on this!   I encouraged weight loss and dietary changes. She agrees but is concerned given her increased stress and the approaching holiday season.   Increase Trulicity to 3 mg weekly. Continue Metformin.  She is scheduled for her eye exam. Foot exam UTD. Declines pneumonia vaccine. Urine micro negative.  Follow up in 3 month.

## 2020-07-03 NOTE — Progress Notes (Signed)
Cardiology Office Note   Date:  07/04/2020   ID:  Chrissa, Meetze 06-24-1986, MRN [id number]  PCP:  Doreene Nest, NP  Cardiologist:   Nona Dell, MD Referring:  Doreene Nest, NP  Chief Complaint  Patient presents with  . Chest Pain      History of Present Illness: Julia Kline is a 34 y.o. female who was referred by Doreene Nest, NP for evaluation of chest pain.   I reviewed these records for this visit.    The pain was felt to be non anginal.  EKG was unremarkable and hs Trop was negative.  She got this chest discomfort shortly after getting the Covid vaccine and wonder if it could have been related.  In the emergency room they told her it was probably related to reflux but she did not think it felt like reflux that she had before.  This was in August and she is still getting some of this discomfort.  It is a 5 out of 10 discomfort.  It is her upper chest.  Is suppressing for gripping discomfort.  It can last for hours coming and going.  She does not have associated jaw or arm discomfort.  She does feel her heart going fast sometimes with this discomfort.  This happens other times even without the discomfort.  It comes and goes spontaneously.  She cannot bring it on.  She does not have associated nausea vomiting or diaphoresis.  She does not have associated shortness of breath, PND or orthopnea.  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  She will play with her kids at recess.  With this she does not bring on any of the symptoms.  Of note she has never had any prior cardiac work-up.  She has been found recently to have diabetes.  Past Medical History:  Diagnosis Date  . Acute conjunctivitis of right eye 07/10/2019  . Acute pain of right knee 08/06/2019  . Diabetes mellitus without complication (HCC)   . Eczema   . Hernia cerebri (HCC)   . History of gestational diabetes 05/02/2017   Normal pp testing 10/2017  . History of  severe pre-eclampsia 05/02/2017  . Hypertension   . Mastitis 06/18/2019  . Migraine   . PCOS (polycystic ovarian syndrome)   . Pregnancy induced hypertension     Past Surgical History:  Procedure Laterality Date  . CESAREAN SECTION  2017   Procedure: CESAREAN DELIVERY ONLY; Surgeon: Marian Sorrow, MD; Location: C[facility]SECTION [facility]; Service: Obstetrics  . CESAREAN SECTION N/A 10/02/2017   Procedure: CESAREAN SECTION;  Surgeon: Tilda Burrow, MD;  Location: [facility] BIRTHING SUITES;  Service: Obstetrics;  Laterality: N/A;  . LAPAROSCOPIC OOPHERECTOMY Left   . LEEP    . OVARIAN CYST REMOVAL    . TONSILLECTOMY    . WISDOM TOOTH EXTRACTION       Current Outpatient Medications  Medication Sig Dispense Refill  . Continuous Blood Gluc Receiver (FREESTYLE LIBRE 14 DAY READER) DEVI 1 application by Does not apply route every 14 (fourteen) days. 6 each 3  . Dulaglutide (TRULICITY) 3 MG/0.5ML SOPN Inject 3 mg as directed once a week. For diabetes. 2 mL 5  . medroxyPROGESTERone (DEPO-PROVERA) 150 MG/ML injection Inject 1 mL (150 mg total) into the muscle every 3 (three) months. 1 mL 2  . metFORMIN (GLUCOPHAGE-XR) 500 MG 24 hr tablet TAKE 2 TABLETS BY MOUTH EVERY DAY WITH BREAKFAST  FOR DIABETES 60 tablet 11   No current facility-administered medications for this visit.    Allergies:   Ancef [cefazolin], Fentanyl, Pseudoephedrine hcl, Shrimp [shellfish allergy], and Morphine and related    Social History:  The patient  reports that she has never smoked. She has never used smokeless tobacco. She reports that she does not drink alcohol and does not use drugs.   Family History:  The patient's family history includes Asthma in her sister; COPD in her maternal grandfather; Diabetes in her father, maternal grandfather, maternal grandmother, mother, paternal grandfather, and paternal grandmother; Heart attack in her paternal grandfather; Hypertension in her father, maternal grandmother, paternal  grandfather, and paternal grandmother; Migraines in her mother.    ROS:  Please see the history of present illness.   Otherwise, review of systems are positive for none.   All other systems are reviewed and negative.    PHYSICAL EXAM: VS:  BP 126/84   Pulse 93   Ht 5\' 9"  (1.753 m)   Wt 246 lb (111.6 kg)   SpO2 100%   BMI 36.33 kg/m  , BMI Body mass index is 36.33 kg/m. GENERAL:  Well appearing HEENT:  Pupils equal round and reactive, fundi not visualized, oral mucosa unremarkable NECK:  No jugular venous distention, waveform within normal limits, carotid upstroke brisk and symmetric, no bruits, no thyromegaly LYMPHATICS:  No cervical, inguinal adenopathy LUNGS:  Clear to auscultation bilaterally BACK:  No CVA tenderness CHEST:  Unremarkable HEART:  PMI not displaced or sustained,S1 and S2 within normal limits, no S3, no S4, no clicks, no rubs, no murmurs ABD:  Flat, positive bowel sounds normal in frequency in pitch, no bruits, no rebound, no guarding, no midline pulsatile mass, no hepatomegaly, no splenomegaly EXT:  2 plus pulses throughout, no edema, no cyanosis no clubbing SKIN:  No rashes no nodules NEURO:  Cranial nerves II through XII grossly intact, motor grossly intact throughout PSYCH:  Cognitively intact, oriented to person place and time    EKG:  EKG is ordered today. The ekg ordered today demonstrates sinus rhythm, rate 93, axis within normal limits, intervals within normal limits, nonspecific diffuse T wave flattening.   Recent Labs: 12/21/2019: ALT 46 03/25/2020: BUN 10; Creatinine, Ser 0.66; Potassium 4.1; Sodium 137 05/10/2020: Hemoglobin 9.6; Platelets 233.0; TSH 0.87    Lipid Panel    Component Value Date/Time   CHOL 109 12/21/2019 1228   TRIG 256.0 (H) 12/21/2019 1228   HDL 28.10 (L) 12/21/2019 1228   CHOLHDL 4 12/21/2019 1228   VLDL 51.2 (H) 12/21/2019 1228   LDLDIRECT 61.0 12/21/2019 1228      Wt Readings from Last 3 Encounters:  07/04/20 246 lb  (111.6 kg)  06/30/20 245 lb (111.1 kg)  05/24/20 246 lb (111.6 kg)      Other studies Reviewed: Additional studies/ records that were reviewed today include: ED records. Review of the above records demonstrates:  Please see elsewhere in the note.     ASSESSMENT AND PLAN:  CHEST PAIN:   Her chest pain sounds nonanginal.  However, she has significant cardiovascular risk factors with diabetes.  I think the pretest probability of obstructive coronary disease is somewhat low.   I will bring the patient back for a POET (Plain Old Exercise Test). This will allow me to screen for obstructive coronary disease, risk stratify and very importantly provide a prescription for exercise.  Of note they did ask if I thought this could be related to the Covid vaccine and  I thought this unlikely given the presentation.  I do not think further inflammatory marker testing would be indicated or helpful.  DM: Her A1c was 9.2 but now is down lower than this and is being managed by Doreene Nest, NP   OBESITY: We had a long discussion about diet.   Current medicines are reviewed at length with the patient today.  The patient does not have concerns regarding medicines.  The following changes have been made:  no change  Labs/ tests ordered today include:   Orders Placed This Encounter  Procedures  . EXERCISE TOLERANCE TEST (ETT)  . EKG 12-Lead     Disposition:   FU with me as needed.      Signed, Rollene Rotunda, MD  07/04/2020 11:53 AM    [facility]

## 2020-07-04 ENCOUNTER — Encounter: Payer: Self-pay | Admitting: Cardiology

## 2020-07-04 ENCOUNTER — Ambulatory Visit (INDEPENDENT_AMBULATORY_CARE_PROVIDER_SITE_OTHER): Payer: 59

## 2020-07-04 ENCOUNTER — Ambulatory Visit (INDEPENDENT_AMBULATORY_CARE_PROVIDER_SITE_OTHER): Payer: 59 | Admitting: Cardiology

## 2020-07-04 ENCOUNTER — Other Ambulatory Visit: Payer: Self-pay

## 2020-07-04 VITALS — BP 132/86 | HR 101

## 2020-07-04 VITALS — BP 126/84 | HR 93 | Ht 69.0 in | Wt 246.0 lb

## 2020-07-04 DIAGNOSIS — Z3042 Encounter for surveillance of injectable contraceptive: Secondary | ICD-10-CM | POA: Diagnosis not present

## 2020-07-04 DIAGNOSIS — R079 Chest pain, unspecified: Secondary | ICD-10-CM | POA: Diagnosis not present

## 2020-07-04 MED ORDER — MEDROXYPROGESTERONE ACETATE 150 MG/ML IM SUSP
150.0000 mg | Freq: Once | INTRAMUSCULAR | Status: AC
  Administered 2020-07-04: 150 mg via INTRAMUSCULAR

## 2020-07-04 NOTE — Patient Instructions (Signed)
Medication Instructions:  No changes *If you need a refill on your cardiac medications before your next appointment, please call your pharmacy*  Lab Work: Covid screen 3 days before exercise tolerance test, quarantine until exercise test.  Testing/Procedures: Your physician has requested that you have an exercise tolerance test. For further information please visit [email or web address] Please also follow instruction sheet, as given.  Follow-Up: At [facility], you and your health needs are our priority.  As part of our continuing mission to provide you with exceptional heart care, we have created designated Provider Care Teams.  These Care Teams include your primary Cardiologist (physician) and Advanced Practice Providers (APPs -  Physician Assistants and Nurse Practitioners) who all work together to provide you with the care you need, when you need it.  Your next appointment:   Follow up as needed

## 2020-07-04 NOTE — Progress Notes (Signed)
Date last pap: 03/01/2017 Last Depo-Provera: 04/08/2020 Side Effects if any: N/A Serum HCG indicated? N/A Depo-Provera 150 mg IM given by: Philemon Kingdom Next appointment due: Jan 31st - Feb 14th, 2022

## 2020-07-05 NOTE — Progress Notes (Signed)
Patient was assessed and managed by nursing staff during this encounter. I have reviewed the chart and agree with the documentation and plan. I have also made any necessary editorial changes.  Floree Zuniga, MD 07/05/2020 1:24 PM 

## 2020-07-10 ENCOUNTER — Other Ambulatory Visit: Payer: Self-pay | Admitting: Primary Care

## 2020-07-19 ENCOUNTER — Other Ambulatory Visit (HOSPITAL_COMMUNITY): Payer: 59

## 2020-07-25 ENCOUNTER — Other Ambulatory Visit: Payer: Self-pay | Admitting: *Deleted

## 2020-07-25 ENCOUNTER — Other Ambulatory Visit (HOSPITAL_COMMUNITY)
Admission: RE | Admit: 2020-07-25 | Discharge: 2020-07-25 | Disposition: A | Discharge status: Home / Self Care | Payer: 59 | Source: Ambulatory Visit | Attending: Cardiology | Admitting: Cardiology

## 2020-07-25 DIAGNOSIS — Z01812 Encounter for preprocedural laboratory examination: Secondary | ICD-10-CM | POA: Diagnosis present

## 2020-07-25 DIAGNOSIS — R079 Chest pain, unspecified: Secondary | ICD-10-CM

## 2020-07-25 DIAGNOSIS — Z20822 Contact with and (suspected) exposure to covid-19: Secondary | ICD-10-CM | POA: Diagnosis not present

## 2020-07-25 LAB — SARS CORONAVIRUS 2 (TAT 6-24 HRS): SARS Coronavirus 2: NEGATIVE

## 2020-07-27 ENCOUNTER — Telehealth (HOSPITAL_COMMUNITY): Payer: Self-pay | Admitting: *Deleted

## 2020-07-27 NOTE — Telephone Encounter (Signed)
Close encounter 

## 2020-07-28 ENCOUNTER — Other Ambulatory Visit: Payer: Self-pay

## 2020-07-28 ENCOUNTER — Encounter (HOSPITAL_COMMUNITY): Payer: 59

## 2020-07-28 ENCOUNTER — Ambulatory Visit (HOSPITAL_COMMUNITY)
Admission: RE | Admit: 2020-07-28 | Discharge: 2020-07-28 | Disposition: A | Discharge status: Home / Self Care | Payer: 59 | Source: Ambulatory Visit | Attending: Cardiology | Admitting: Cardiology

## 2020-07-28 DIAGNOSIS — R079 Chest pain, unspecified: Secondary | ICD-10-CM | POA: Diagnosis present

## 2020-07-29 LAB — EXERCISE TOLERANCE TEST
Estimated workload: 10.5 METS
Exercise duration (min): 9 min
Exercise duration (sec): 15 s
MPHR: 186 {beats}/min
Peak HR: 190 {beats}/min
Percent HR: 102 %
Rest HR: 106 {beats}/min

## 2020-08-01 ENCOUNTER — Telehealth (INDEPENDENT_AMBULATORY_CARE_PROVIDER_SITE_OTHER): Payer: 59 | Admitting: Obstetrics & Gynecology

## 2020-08-01 ENCOUNTER — Other Ambulatory Visit: Payer: Self-pay

## 2020-08-01 DIAGNOSIS — Z3042 Encounter for surveillance of injectable contraceptive: Secondary | ICD-10-CM | POA: Diagnosis not present

## 2020-08-01 DIAGNOSIS — N921 Excessive and frequent menstruation with irregular cycle: Secondary | ICD-10-CM | POA: Diagnosis not present

## 2020-08-01 NOTE — Progress Notes (Signed)
GYNECOLOGY VIRTUAL VISIT ENCOUNTER NOTE  Provider location: Center for [facility] at [facility]   I connected with Julia Kline on 08/01/20 at  4:00 PM EST by MyChart Video Encounter at home and verified that I am speaking with the correct person using two identifiers.   I discussed the limitations, risks, security and privacy concerns of performing an evaluation and management service virtually and the availability of in person appointments. I also discussed with the patient that there may be a patient responsible charge related to this service. The patient expressed understanding and agreed to proceed.   History:  Julia Kline is a 34 y.o. [phone] female being followed up today for contraception advice.  On Depo Provera for contraception, reports having heavy period towards the end of the interval. Last shot was in 03/2020.  She wants to discuss side effects of Depo-Provera further, still concerned about decreased libido as discussed during last visit in 02/2020. She denies any current  abnormal vaginal discharge, bleeding, pelvic pain or other concerns.      Past Medical History:  Diagnosis Date  . Acute conjunctivitis of right eye 07/10/2019  . Acute pain of right knee 08/06/2019  . Diabetes mellitus without complication (HCC)   . Eczema   . Hernia cerebri (HCC)   . History of gestational diabetes 05/02/2017   Normal pp testing 10/2017  . History of severe pre-eclampsia 05/02/2017  . Hypertension   . Mastitis 06/18/2019  . Migraine   . PCOS (polycystic ovarian syndrome)   . Pregnancy induced hypertension    Past Surgical History:  Procedure Laterality Date  . CESAREAN SECTION  2017   Procedure: CESAREAN DELIVERY ONLY; Surgeon: Marian Sorrow, MD; Location: C-SECTION [facility]; Service: Obstetrics  . CESAREAN SECTION N/A 10/02/2017   Procedure: CESAREAN SECTION;  Surgeon: Tilda Burrow, MD;  Location: [facility] BIRTHING SUITES;  Service: Obstetrics;   Laterality: N/A;  . LAPAROSCOPIC OOPHERECTOMY Left   . LEEP    . OVARIAN CYST REMOVAL    . TONSILLECTOMY    . WISDOM TOOTH EXTRACTION     The following portions of the patient's history were reviewed and updated as appropriate: allergies, current medications, past family history, past medical history, past social history, past surgical history and problem list.   Review of Systems:  Pertinent items noted in HPI and remainder of comprehensive ROS otherwise negative.  Physical Exam:   General:  Alert, oriented and cooperative. Patient appears to be in no acute distress.  Mental Status: Normal mood and affect. Normal behavior. Normal judgment and thought content.   Respiratory: Normal respiratory effort, no problems with respiration noted  Rest of physical exam deferred due to type of encounter      Assessment and Plan:     1. Surveillance for Depo-Provera contraception 2. Breakthrough bleeding on Depo-Provera Discussed side effect profile for Depo Provera, talked about risk of reversible bone loss with long term use as well as other hormonal side effects (weight gain, mood changes, decreased libido, breast tenderness etc).  Given her breakthrough bleeding towards the end of the 12 week interval, recommended that she consider shorter interval (9-10 weeks).  She also wants to consider pills, discussed  Slynd (progestin only) given her history of migraines and HTN, and this is less progestin so her symptoms may be less. She will investigate this and let Julia Kline know.  Last Depo Provera injection was on 04/07/2020.      I discussed the assessment and treatment  plan with the patient. The patient was provided an opportunity to ask questions and all were answered. The patient agreed with the plan and demonstrated an understanding of the instructions.   The patient was advised to call back or seek an in-person evaluation/go to the ED if the symptoms worsen or if the condition fails to improve as  anticipated.  I provided 15 minutes of face-to-face time during this encounter.   Jaynie Collins, MD Center for [employer], [facility]

## 2020-08-01 NOTE — Patient Instructions (Addendum)
Consider SLYND as an alternative. We have samples in office if you want to try it for a couple of months! :-)  Consider getting Depo Provera every 10 weeks instead of 12-13 weeks.  Of note, last injection was 04/07/2020 on our system, you are overdue but can be restarted if desired. Call office and let them know.

## 2020-08-03 LAB — HM DIABETES EYE EXAM

## 2020-08-04 ENCOUNTER — Encounter: Payer: Self-pay | Admitting: Primary Care

## 2020-08-16 ENCOUNTER — Other Ambulatory Visit: Payer: Self-pay | Admitting: Primary Care

## 2020-08-16 DIAGNOSIS — E119 Type 2 diabetes mellitus without complications: Secondary | ICD-10-CM

## 2020-09-09 NOTE — Telephone Encounter (Signed)
It looks like this communication may have been sent to me in error.

## 2020-09-12 ENCOUNTER — Ambulatory Visit (INDEPENDENT_AMBULATORY_CARE_PROVIDER_SITE_OTHER): Payer: 59 | Admitting: Internal Medicine

## 2020-09-12 ENCOUNTER — Ambulatory Visit: Payer: 59 | Admitting: Family Medicine

## 2020-09-12 ENCOUNTER — Encounter: Payer: Self-pay | Admitting: Internal Medicine

## 2020-09-12 ENCOUNTER — Other Ambulatory Visit: Payer: Self-pay

## 2020-09-12 DIAGNOSIS — L989 Disorder of the skin and subcutaneous tissue, unspecified: Secondary | ICD-10-CM | POA: Diagnosis not present

## 2020-09-12 HISTORY — DX: Disorder of the skin and subcutaneous tissue, unspecified: L98.9

## 2020-09-12 MED ORDER — TRIAMCINOLONE ACETONIDE 0.1 % EX CREA: 1.0000 "application " | TOPICAL_CREAM | Freq: Two times a day (BID) | CUTANEOUS | 1 refills | Status: DC | PRN

## 2020-09-12 NOTE — Assessment & Plan Note (Signed)
2 on feet Clearly not infectious--bacterial or fungal No other rash on feet Very itchy---could be reaction to bug bite or some exposure----but no clear inciting thing  Will try triamcinolone cream Consider derm if spreads

## 2020-09-12 NOTE — Progress Notes (Signed)
Subjective:    Patient ID: Julia Kline, female    DOB: 1986-03-27, 35 y.o.   MRN: [id number]  HPI Here due to bumps on her feet This visit occurred during the SARS-CoV-2 public health emergency.  Safety protocols were in place, including screening questions prior to the visit, additional usage of staff PPE, and extensive cleaning of exam room while observing appropriate contact time as indicated for disinfecting solutions.   Started with bumps on feet towards the evening--- last week Also had some itching on the bottom of her feet last month Mainly on left Had been on the bottom of her feet--but not lately One by ankle, another by toes Look like blisters or sores No edema or swelling  No new medications---but did have increase in trulicity last month  Current Outpatient Medications on File Prior to Visit  Medication Sig Dispense Refill  . Continuous Blood Gluc Receiver (FREESTYLE LIBRE 14 DAY READER) DEVI 1 application by Does not apply route every 14 (fourteen) days. 6 each 3  . Dulaglutide (TRULICITY) 3 MG/0.5ML SOPN Inject 3 mg as directed once a week. For diabetes. 2 mL 5  . medroxyPROGESTERone (DEPO-PROVERA) 150 MG/ML injection Inject 1 mL (150 mg total) into the muscle every 3 (three) months. 1 mL 2  . metFORMIN (GLUCOPHAGE-XR) 500 MG 24 hr tablet TAKE 2 TABLETS BY MOUTH EVERY DAY WITH BREAKFAST FOR DIABETES 60 tablet 11  . TRULICITY 1.5 MG/0.5ML SOPN INJECT 0.5 MLS (1.5 MG TOTAL) INTO THE SKIN ONCE A WEEK. FOR DIABETES. 6 mL 1   No current facility-administered medications on file prior to visit.    Allergies  Allergen Reactions  . Ancef [Cefazolin] Hives and Swelling    Facial and neck swelling; Had reaction after receiving fentanyl and ancef in the OR  . Fentanyl Hives and Swelling    Facial and neck swelling; Had reaction after receiving fentanyl and ancef in the OR  . Pseudoephedrine Hcl Other (See Comments)  . Shrimp [Shellfish Allergy] Other (See Comments)     Swelling of throat  . Morphine And Related Rash    Past Medical History:  Diagnosis Date  . Acute conjunctivitis of right eye 07/10/2019  . Acute pain of right knee 08/06/2019  . Diabetes mellitus without complication (HCC)   . Eczema   . Hernia cerebri (HCC)   . History of gestational diabetes 05/02/2017   Normal pp testing 10/2017  . History of severe pre-eclampsia 05/02/2017  . Hypertension   . Mastitis 06/18/2019  . Migraine   . PCOS (polycystic ovarian syndrome)   . Pregnancy induced hypertension     Past Surgical History:  Procedure Laterality Date  . CESAREAN SECTION  2017   Procedure: CESAREAN DELIVERY ONLY; Surgeon: Marian Sorrow, MD; Location: [facility]; Service: Obstetrics  . CESAREAN SECTION N/A 10/02/2017   Procedure: CESAREAN SECTION;  Surgeon: Tilda Burrow, MD;  Location: [facility] BIRTHING SUITES;  Service: Obstetrics;  Laterality: N/A;  . LAPAROSCOPIC OOPHERECTOMY Left   . LEEP    . OVARIAN CYST REMOVAL    . TONSILLECTOMY    . WISDOM TOOTH EXTRACTION      Family History  Problem Relation Age of Onset  . Diabetes Mother   . Migraines Mother   . Diabetes Father   . Hypertension Father   . Hypertension Maternal Grandmother   . Diabetes Maternal Grandmother   . Diabetes Maternal Grandfather   . COPD Maternal Grandfather   . Diabetes Paternal Grandmother   .  Hypertension Paternal Grandmother   . Diabetes Paternal Grandfather   . Hypertension Paternal Grandfather   . Heart attack Paternal Grandfather   . Asthma Sister     Social History   Socioeconomic History  . Marital status: Married    Spouse name: Not on file  . Number of children: Not on file  . Years of education: Not on file  . Highest education level: Not on file  Occupational History  . Not on file  Tobacco Use  . Smoking status: Never Smoker  . Smokeless tobacco: Never Used  Substance and Sexual Activity  . Alcohol use: No  . Drug use: No  . Sexual activity: Yes     Birth control/protection: Injection  Other Topics Concern  . Not on file  Social History Narrative   Married.   2 children.   Works at [employer].   Social Determinants of Health   Financial Resource Strain: Not on file  Food Insecurity: Not on file  Transportation Needs: Not on file  Physical Activity: Not on file  Stress: Not on file  Social Connections: Not on file  Intimate Partner Violence: Not on file   Review of Systems No fever Not sick but didn't feel well briefly 2 weeks ago---got better (some throat symptoms). COVID test negative    Objective:   Physical Exam Skin:    Comments: 2 small excoriated spots (34mm or so) --one just above left ankle---the other is proximal to great toe No inflammation            Assessment & Plan:

## 2020-10-04 ENCOUNTER — Ambulatory Visit (INDEPENDENT_AMBULATORY_CARE_PROVIDER_SITE_OTHER): Payer: 59 | Admitting: Primary Care

## 2020-10-04 ENCOUNTER — Other Ambulatory Visit: Payer: Self-pay

## 2020-10-04 ENCOUNTER — Ambulatory Visit: Payer: 59 | Admitting: Primary Care

## 2020-10-04 ENCOUNTER — Encounter: Payer: Self-pay | Admitting: Primary Care

## 2020-10-04 VITALS — BP 130/72 | HR 100 | Temp 97.6°F | Ht 69.0 in | Wt 243.0 lb

## 2020-10-04 DIAGNOSIS — E119 Type 2 diabetes mellitus without complications: Secondary | ICD-10-CM

## 2020-10-04 DIAGNOSIS — L989 Disorder of the skin and subcutaneous tissue, unspecified: Secondary | ICD-10-CM

## 2020-10-04 LAB — POCT GLYCOSYLATED HEMOGLOBIN (HGB A1C): Hemoglobin A1C: 7.5 % — AB (ref 4.0–5.6)

## 2020-10-04 MED ORDER — TRIAMCINOLONE ACETONIDE 0.5 % EX OINT: 1.0000 "application " | TOPICAL_OINTMENT | Freq: Two times a day (BID) | CUTANEOUS | 0 refills | Status: DC

## 2020-10-04 MED ORDER — METFORMIN HCL ER 500 MG PO TB24
1000.0000 mg | ORAL_TABLET | Freq: Two times a day (BID) | ORAL | 3 refills | Status: DC
Start: 2020-10-04 — End: 2021-06-07

## 2020-10-04 NOTE — Patient Instructions (Signed)
We increased the dose of your Metformin XR 500 mg. Take 2 tablets by mouth twice daily.  Continue Trulicity 3 mg daily.  Start exercising. You should be getting 150 minutes of moderate intensity exercise weekly.  Be sure to work on a healthy diet. Ensure you are consuming 64 ounces of water daily.  Schedule a lab appointment for 3 months to repeat your A1C.  Please schedule a follow up appointment in 6 months with me.   It was a pleasure to see you today!   Diabetes Mellitus and Nutrition, Adult When you have diabetes, or diabetes mellitus, it is very important to have healthy eating habits because your blood sugar (glucose) levels are greatly affected by what you eat and drink. Eating healthy foods in the right amounts, at about the same times every day, can help you:  Control your blood glucose.  Lower your risk of heart disease.  Improve your blood pressure.  Reach or maintain a healthy weight. What can affect my meal plan? Every person with diabetes is different, and each person has different needs for a meal plan. Your health care provider may recommend that you work with a dietitian to make a meal plan that is best for you. Your meal plan may vary depending on factors such as:  The calories you need.  The medicines you take.  Your weight.  Your blood glucose, blood pressure, and cholesterol levels.  Your activity level.  Other health conditions you have, such as heart or kidney disease. How do carbohydrates affect me? Carbohydrates, also called carbs, affect your blood glucose level more than any other type of food. Eating carbs naturally raises the amount of glucose in your blood. Carb counting is a method for keeping track of how many carbs you eat. Counting carbs is important to keep your blood glucose at a healthy level, especially if you use insulin or take certain oral diabetes medicines. It is important to know how many carbs you can safely have in each meal.  This is different for every person. Your dietitian can help you calculate how many carbs you should have at each meal and for each snack. How does alcohol affect me? Alcohol can cause a sudden decrease in blood glucose (hypoglycemia), especially if you use insulin or take certain oral diabetes medicines. Hypoglycemia can be a life-threatening condition. Symptoms of hypoglycemia, such as sleepiness, dizziness, and confusion, are similar to symptoms of having too much alcohol.  Do not drink alcohol if: ? Your health care provider tells you not to drink. ? You are pregnant, may be pregnant, or are planning to become pregnant.  If you drink alcohol: ? Do not drink on an empty stomach. ? Limit how much you use to:  0-1 drink a day for women.  0-2 drinks a day for men. ? Be aware of how much alcohol is in your drink. In the [place] one drink equals one 12 oz bottle of beer (355 mL), one 5 oz glass of wine (148 mL), or one 1 oz glass of hard liquor (44 mL). ? Keep yourself hydrated with water, diet soda, or unsweetened iced tea.  Keep in mind that regular soda, juice, and other mixers may contain a lot of sugar and must be counted as carbs. What are tips for following this plan? Reading food labels  Start by checking the serving size on the "Nutrition Facts" label of packaged foods and drinks. The amount of calories, carbs, fats, and other nutrients listed on  the label is based on one serving of the item. Many items contain more than one serving per package.  Check the total grams (g) of carbs in one serving. You can calculate the number of servings of carbs in one serving by dividing the total carbs by 15. For example, if a food has 30 g of total carbs per serving, it would be equal to 2 servings of carbs.  Check the number of grams (g) of saturated fats and trans fats in one serving. Choose foods that have a low amount or none of these fats.  Check the number of milligrams (mg) of salt (sodium)  in one serving. Most people should limit total sodium intake to less than 2,300 mg per day.  Always check the nutrition information of foods labeled as "low-fat" or "nonfat." These foods may be higher in added sugar or refined carbs and should be avoided.  Talk to your dietitian to identify your daily goals for nutrients listed on the label. Shopping  Avoid buying canned, pre-made, or processed foods. These foods tend to be high in fat, sodium, and added sugar.  Shop around the outside edge of the grocery store. This is where you will most often find fresh fruits and vegetables, bulk grains, fresh meats, and fresh dairy. Cooking  Use low-heat cooking methods, such as baking, instead of high-heat cooking methods like deep frying.  Cook using healthy oils, such as olive, canola, or sunflower oil.  Avoid cooking with butter, cream, or high-fat meats. Meal planning  Eat meals and snacks regularly, preferably at the same times every day. Avoid going long periods of time without eating.  Eat foods that are high in fiber, such as fresh fruits, vegetables, beans, and whole grains. Talk with your dietitian about how many servings of carbs you can eat at each meal.  Eat 4-6 oz (112-168 g) of lean protein each day, such as lean meat, chicken, fish, eggs, or tofu. One ounce (oz) of lean protein is equal to: ? 1 oz (28 g) of meat, chicken, or fish. ? 1 egg. ?  cup (62 g) of tofu.  Eat some foods each day that contain healthy fats, such as avocado, nuts, seeds, and fish.   What foods should I eat? Fruits Berries. Apples. Oranges. Peaches. Apricots. Plums. Grapes. Mango. Papaya. Pomegranate. Kiwi. Cherries. Vegetables Lettuce. Spinach. Leafy greens, including kale, chard, collard greens, and mustard greens. Beets. Cauliflower. Cabbage. Broccoli. Carrots. Green beans. Tomatoes. Peppers. Onions. Cucumbers. Brussels sprouts. Grains Whole grains, such as whole-wheat or whole-grain bread, crackers,  tortillas, cereal, and pasta. Unsweetened oatmeal. Quinoa. Brown or wild rice. Meats and other proteins Seafood. Poultry without skin. Lean cuts of poultry and beef. Tofu. Nuts. Seeds. Dairy Low-fat or fat-free dairy products such as milk, yogurt, and cheese. The items listed above may not be a complete list of foods and beverages you can eat. Contact a dietitian for more information. What foods should I avoid? Fruits Fruits canned with syrup. Vegetables Canned vegetables. Frozen vegetables with butter or cream sauce. Grains Refined white flour and flour products such as bread, pasta, snack foods, and cereals. Avoid all processed foods. Meats and other proteins Fatty cuts of meat. Poultry with skin. Breaded or fried meats. Processed meat. Avoid saturated fats. Dairy Full-fat yogurt, cheese, or milk. Beverages Sweetened drinks, such as soda or iced tea. The items listed above may not be a complete list of foods and beverages you should avoid. Contact a dietitian for more information. Questions to  ask a health care provider  Do I need to meet with a diabetes educator?  Do I need to meet with a dietitian?  What number can I call if I have questions?  When are the best times to check my blood glucose? Where to find more information:  American Diabetes Association: diabetes.org  Academy of Nutrition and Dietetics: www.eatright.[employer] of Diabetes and Digestive and Kidney Diseases: CarFlippers.tn  Association of Diabetes Care and Education Specialists: www.diabeteseducator.org Summary  It is important to have healthy eating habits because your blood sugar (glucose) levels are greatly affected by what you eat and drink.  A healthy meal plan will help you control your blood glucose and maintain a healthy lifestyle.  Your health care provider may recommend that you work with a dietitian to make a meal plan that is best for you.  Keep in mind that carbohydrates  (carbs) and alcohol have immediate effects on your blood glucose levels. It is important to count carbs and to use alcohol carefully. This information is not intended to replace advice given to you by your health care provider. Make sure you discuss any questions you have with your health care provider. Document Revised: 07/14/2019 Document Reviewed: 07/14/2019 Elsevier Patient Education  2021 [employer].

## 2020-10-04 NOTE — Assessment & Plan Note (Signed)
Doesn't appear to be allergic reaction to Trulicity. No other changes. Unclear etiology.  Rx for Triamcinolone 0.5% ointment sent to pharmacy. Referral placed for dermatology.

## 2020-10-04 NOTE — Assessment & Plan Note (Signed)
A1C today of 7.5 which is exactly the same as it was in November 2021. Trulicity dose increase to 3 mg really hasn't made much of an improvement.  Increase Metformin XR 500 mg to 1000 mg BID. Continue Trulicity to 3 mg weekly.  She will work on weight loss through diet and exercise.  Repeat A1C in 3 months. Follow up with [place] in 6 months.

## 2020-10-04 NOTE — Progress Notes (Signed)
Subjective:    Patient ID: Julia Kline, female    DOB: [birth year], 35 y.o.   MRN: [id number]  HPI  This visit occurred during the SARS-CoV-2 public health emergency.  Safety protocols were in place, including screening questions prior to the visit, additional usage of staff PPE, and extensive cleaning of exam room while observing appropriate contact time as indicated for disinfecting solutions.   Julia Kline is a 35 year old female with a history of hypertension, migraines, type 2 diabetes, AUB who presents today for follow up of diabetes and skin lesions.  1) Type 2 Diabetes:Current medications include: Metformin XR 1000 mg daily, Trulicity 3 mg weekly. She denies GI upset with Metformin or Trulicity.   She is checking her blood glucose 0 times daily.   Last A1C: 7.5 in August 2021, 7.5 today.  Last Eye Exam: UTD Last Foot Exam: UTD Pneumonia Vaccination: ACE/ARB: None. Urine microalbumin UTD Statin: None  BP Readings from Last 3 Encounters:  10/04/20 130/72  09/12/20 132/82  07/04/20 126/84   She is not exercising, but has increased her daily steps. She endorses a healthy diet.   2) Skin Lesions: Acute since mid January 2022, initially noted to feet, then ankles and toes. Evaluated by Dr. Alphonsus Sias on 09/12/20. She denied any changes except for a dose increase of Trulicity to 3 mg. She was prescribed triamcinolone cream and advised to follow up if no improvement.  Since her visit she's noticed the lesions to her upper and lower extremities, mostly to right side. She's used the triamcinolone cream with temporary improvement. Her spots are itchy. No one else in her home is itching.   Review of Systems  Gastrointestinal: Negative for abdominal pain, diarrhea and nausea.  Skin: Positive for rash.  Neurological: Negative for dizziness and numbness.       Past Medical History:  Diagnosis Date  . Acute conjunctivitis of right eye 07/10/2019  . Acute pain of right knee  08/06/2019  . Diabetes mellitus without complication (HCC)   . Eczema   . Hernia cerebri (HCC)   . History of gestational diabetes 05/02/2017   Normal pp testing 10/2017  . History of severe pre-eclampsia 05/02/2017  . Hypertension   . Mastitis 06/18/2019  . Migraine   . PCOS (polycystic ovarian syndrome)   . Pregnancy induced hypertension      Social History   Socioeconomic History  . Marital status: Married    Spouse name: Not on file  . Number of children: Not on file  . Years of education: Not on file  . Highest education level: Not on file  Occupational History  . Not on file  Tobacco Use  . Smoking status: Never Smoker  . Smokeless tobacco: Never Used  Substance and Sexual Activity  . Alcohol use: No  . Drug use: No  . Sexual activity: Yes    Birth control/protection: Injection  Other Topics Concern  . Not on file  Social History Narrative   Married.   2 children.   Works at [employer].   Social Determinants of Health   Financial Resource Strain: Not on file  Food Insecurity: Not on file  Transportation Needs: Not on file  Physical Activity: Not on file  Stress: Not on file  Social Connections: Not on file  Intimate Partner Violence: Not on file    Past Surgical History:  Procedure Laterality Date  . CESAREAN SECTION  2017   Procedure: CESAREAN DELIVERY ONLY; Surgeon: Carola Rhine  Allena Katz, MD; Location: C-SECTION SUITE [facility]; Service: Obstetrics  . CESAREAN SECTION N/A 10/02/2017   Procedure: CESAREAN SECTION;  Surgeon: Tilda Burrow, MD;  Location: [facility] BIRTHING SUITES;  Service: Obstetrics;  Laterality: N/A;  . LAPAROSCOPIC OOPHERECTOMY Left   . LEEP    . OVARIAN CYST REMOVAL    . TONSILLECTOMY    . WISDOM TOOTH EXTRACTION      Family History  Problem Relation Age of Onset  . Diabetes Mother   . Migraines Mother   . Diabetes Father   . Hypertension Father   . Hypertension Maternal Grandmother   . Diabetes Maternal Grandmother   .  Diabetes Maternal Grandfather   . COPD Maternal Grandfather   . Diabetes Paternal Grandmother   . Hypertension Paternal Grandmother   . Diabetes Paternal Grandfather   . Hypertension Paternal Grandfather   . Heart attack Paternal Grandfather   . Asthma Sister     Allergies  Allergen Reactions  . Ancef [Cefazolin] Hives and Swelling    Facial and neck swelling; Had reaction after receiving fentanyl and ancef in the OR  . Fentanyl Hives and Swelling    Facial and neck swelling; Had reaction after receiving fentanyl and ancef in the OR  . Pseudoephedrine Hcl Other (See Comments)  . Shrimp [Shellfish Allergy] Other (See Comments)    Swelling of throat  . Morphine And Related Rash    Current Outpatient Medications on File Prior to Visit  Medication Sig Dispense Refill  . Continuous Blood Gluc Receiver (FREESTYLE LIBRE 14 DAY READER) DEVI 1 application by Does not apply route every 14 (fourteen) days. 6 each 3  . Dulaglutide (TRULICITY) 3 MG/0.5ML SOPN Inject 3 mg as directed once a week. For diabetes. 2 mL 5  . medroxyPROGESTERone (DEPO-PROVERA) 150 MG/ML injection Inject 1 mL (150 mg total) into the muscle every 3 (three) months. (Patient not taking: Reported on 10/04/2020) 1 mL 2   No current facility-administered medications on file prior to visit.    BP 130/72   Pulse 100   Temp 97.6 F (36.4 C) (Temporal)   Ht 5\' 9"  (1.753 m)   Wt 243 lb (110.2 kg)   SpO2 100%   BMI 35.88 kg/m    Objective:   Physical Exam Constitutional:      Appearance: She is well-nourished.  Cardiovascular:     Rate and Rhythm: Normal rate and regular rhythm.  Pulmonary:     Effort: Pulmonary effort is normal.     Breath sounds: Normal breath sounds.  Musculoskeletal:     Cervical back: Neck supple.  Skin:    General: Skin is warm and dry.     Comments: Scattered lesions to upper and lower extremities in various stages. No cellulitis, evidence for fungal or allergic involvement.    Psychiatric:        Mood and Affect: Mood and affect normal.            Assessment & Plan:

## 2020-10-08 ENCOUNTER — Emergency Department (HOSPITAL_COMMUNITY): Payer: 59

## 2020-10-08 ENCOUNTER — Other Ambulatory Visit: Payer: Self-pay

## 2020-10-08 ENCOUNTER — Encounter (HOSPITAL_COMMUNITY): Payer: Self-pay

## 2020-10-08 ENCOUNTER — Ambulatory Visit
Admission: EM | Admit: 2020-10-08 | Discharge: 2020-10-08 | Disposition: A | Discharge status: Other Acute Inpatient Hospital | Payer: 59

## 2020-10-08 ENCOUNTER — Emergency Department (HOSPITAL_COMMUNITY)
Admission: EM | Admit: 2020-10-08 | Discharge: 2020-10-08 | Disposition: A | Discharge status: Home / Self Care | Payer: 59 | Attending: Emergency Medicine | Admitting: Emergency Medicine

## 2020-10-08 DIAGNOSIS — Y9389 Activity, other specified: Secondary | ICD-10-CM | POA: Insufficient documentation

## 2020-10-08 DIAGNOSIS — Z7984 Long term (current) use of oral hypoglycemic drugs: Secondary | ICD-10-CM | POA: Insufficient documentation

## 2020-10-08 DIAGNOSIS — S66121A Laceration of flexor muscle, fascia and tendon of left index finger at wrist and hand level, initial encounter: Secondary | ICD-10-CM | POA: Diagnosis not present

## 2020-10-08 DIAGNOSIS — S61215A Laceration without foreign body of left ring finger without damage to nail, initial encounter: Secondary | ICD-10-CM | POA: Insufficient documentation

## 2020-10-08 DIAGNOSIS — W25XXXA Contact with sharp glass, initial encounter: Secondary | ICD-10-CM | POA: Diagnosis not present

## 2020-10-08 DIAGNOSIS — S6992XA Unspecified injury of left wrist, hand and finger(s), initial encounter: Secondary | ICD-10-CM | POA: Diagnosis present

## 2020-10-08 DIAGNOSIS — Z8616 Personal history of COVID-19: Secondary | ICD-10-CM | POA: Diagnosis not present

## 2020-10-08 DIAGNOSIS — E119 Type 2 diabetes mellitus without complications: Secondary | ICD-10-CM | POA: Diagnosis not present

## 2020-10-08 DIAGNOSIS — I10 Essential (primary) hypertension: Secondary | ICD-10-CM | POA: Insufficient documentation

## 2020-10-08 DIAGNOSIS — S61209A Unspecified open wound of unspecified finger without damage to nail, initial encounter: Secondary | ICD-10-CM

## 2020-10-08 DIAGNOSIS — S56129A Laceration of flexor muscle, fascia and tendon of unspecified finger at forearm level, initial encounter: Secondary | ICD-10-CM

## 2020-10-08 MED ORDER — LIDOCAINE HCL (PF) 2 % IJ SOLN: 5.0000 mL | Freq: Once | INTRAMUSCULAR | Status: AC

## 2020-10-08 MED ORDER — LIDOCAINE HCL (PF) 2 % IJ SOLN
INTRAMUSCULAR | Status: AC
  Administered 2020-10-08: 5 mL via INTRADERMAL
  Filled 2020-10-08: qty 20

## 2020-10-08 MED ORDER — SULFAMETHOXAZOLE-TRIMETHOPRIM 800-160 MG PO TABS: 1.0000 | ORAL_TABLET | Freq: Two times a day (BID) | ORAL | 0 refills | Status: AC

## 2020-10-08 MED ORDER — POVIDONE-IODINE 10 % EX SOLN
CUTANEOUS | Status: AC
  Filled 2020-10-08: qty 15

## 2020-10-08 NOTE — Discharge Instructions (Addendum)
You likely have a injury of the tendon in your finger.  You will need to see the hand surgeon.  Call Dr. Carlos Levering office at the number listed on Monday morning to arrange a follow-up appointment.  Keep your fingers clean and bandage.  Keep the pinky finger splinted.  Elevate your hand when possible to help avoid swelling.  Tylenol or ibuprofen if needed for pain.

## 2020-10-08 NOTE — ED Triage Notes (Signed)
Pt to er, pt states that she is here because she was cleaning a table and it had a glass insert and she cut her L had on the pinky and ring finger, pt has aprox 1/2 inch cut to both fingers, dressing placed, bleeding controled at this time, states that she went to urgent care and they sent her to the ed.

## 2020-10-08 NOTE — ED Notes (Signed)
Ring removed from ring finger of left hand and handed to pt who placed it on finger of right hand.

## 2020-10-08 NOTE — ED Provider Notes (Signed)
[facility] EMERGENCY DEPARTMENT Provider Note   CSN: [id number] Arrival date & time: 10/08/20  1224     History Chief Complaint  Patient presents with  . Extremity Laceration    Julia Kline is a 35 y.o. female.  HPI      Julia Kline is a 34 y.o. female who presents to the Emergency Department complaining of lacerations to her left ring and little finger.  States that she was cleaning a glass table earlier today when the lacerations occurred.  She has applied direct pressure to control the bleeding.  She went to the local urgent care and was advised to come to the emergency department for further evaluation.  She states that she has some tingling sensation to her fingers and difficulty flexing her fifth finger.  She denies wrist pain or known foreign bodies.  Last Td is unknown.  Takes Excedrin occasionally, but no blood thinners or daily NSAID use.   Past Medical History:  Diagnosis Date  . Acute conjunctivitis of right eye 07/10/2019  . Acute pain of right knee 08/06/2019  . Diabetes mellitus without complication (HCC)   . Eczema   . Hernia cerebri (HCC)   . History of gestational diabetes 05/02/2017   Normal pp testing 10/2017  . History of severe pre-eclampsia 05/02/2017  . Hypertension   . Mastitis 06/18/2019  . Migraine   . PCOS (polycystic ovarian syndrome)   . Pregnancy induced hypertension     Patient Active Problem List   Diagnosis Date Noted  . Skin lesions 09/12/2020  . Chest pain 05/10/2020  . Hidradenitis axillaris 04/20/2020  . Impingement syndrome of left shoulder 02/16/2020  . Type 2 diabetes mellitus (HCC) 12/21/2019  . History of COVID-19 12/21/2019  . Anemia 10/14/2019  . Hypertension 10/13/2019  . Breakthrough bleeding on depo provera 10/13/2019  . Migraines 04/01/2018  . Abnormal uterine bleeding (AUB) 01/06/2018  . Obesity (BMI 30-39.9) 08/14/2017    Past Surgical History:  Procedure Laterality Date  . CESAREAN SECTION   2017   Procedure: CESAREAN DELIVERY ONLY; Surgeon: Marian Sorrow, MD; Location: C-SECTION [facility]; Service: Obstetrics  . CESAREAN SECTION N/A 10/02/2017   Procedure: CESAREAN SECTION;  Surgeon: Tilda Burrow, MD;  Location: [facility] BIRTHING SUITES;  Service: Obstetrics;  Laterality: N/A;  . LAPAROSCOPIC OOPHERECTOMY Left   . LEEP    . OVARIAN CYST REMOVAL    . TONSILLECTOMY    . WISDOM TOOTH EXTRACTION       OB History    Gravida  2   Para  2   Term  2   Preterm  0   AB  0   Living  2     SAB  0   IAB  0   Ectopic  0   Multiple  0   Live Births  2           Family History  Problem Relation Age of Onset  . Diabetes Mother   . Migraines Mother   . Diabetes Father   . Hypertension Father   . Hypertension Maternal Grandmother   . Diabetes Maternal Grandmother   . Diabetes Maternal Grandfather   . COPD Maternal Grandfather   . Diabetes Paternal Grandmother   . Hypertension Paternal Grandmother   . Diabetes Paternal Grandfather   . Hypertension Paternal Grandfather   . Heart attack Paternal Grandfather   . Asthma Sister     Social History   Tobacco Use  . Smoking status:  Never Smoker  . Smokeless tobacco: Never Used  Substance Use Topics  . Alcohol use: No  . Drug use: No    Home Medications Prior to Admission medications   Medication Sig Start Date End Date Taking? Authorizing Provider  Continuous Blood Gluc Receiver (FREESTYLE LIBRE 14 DAY READER) DEVI 1 application by Does not apply route every 14 (fourteen) days. 05/20/20   Lorre Munroe, NP  Dulaglutide (TRULICITY) 3 MG/0.5ML SOPN Inject 3 mg as directed once a week. For diabetes. 06/30/20   Doreene Nest, NP  medroxyPROGESTERone (DEPO-PROVERA) 150 MG/ML injection Inject 1 mL (150 mg total) into the muscle every 3 (three) months. Patient not taking: Reported on 10/04/2020 04/07/20   Macclenny Bing, MD  metFORMIN (GLUCOPHAGE-XR) 500 MG 24 hr tablet Take 2 tablets (1,000 mg total) by  mouth 2 (two) times daily with a meal. For diabetes. 10/04/20   Doreene Nest, NP  triamcinolone ointment (KENALOG) 0.5 % Apply 1 application topically 2 (two) times daily. 10/04/20   Doreene Nest, NP    Allergies    Ancef [cefazolin], Fentanyl, Pseudoephedrine hcl, Shrimp [shellfish allergy], and Morphine and related  Review of Systems   Review of Systems  Constitutional: Negative for chills and fever.  Musculoskeletal: Negative for arthralgias, back pain and joint swelling.  Skin: Positive for wound.       Laceration left ring and little fingers  Neurological: Positive for numbness ("tingling" to ring and little fingers). Negative for dizziness and weakness.  Hematological: Does not bruise/bleed easily.    Physical Exam Updated Vital Signs BP (!) 131/91   Pulse (!) 103   Temp (!) 97.5 F (36.4 C) (Oral)   Resp 18   Ht 5\' 9"  (1.753 m)   Wt 110.2 kg   SpO2 100%   BMI 35.88 kg/m   Physical Exam  Physical Exam Vitals and nursing note reviewed.  Constitutional:      General: She is not in acute distress.    Appearance: Normal appearance. She is well-developed and well-nourished.  HENT:     Head: Atraumatic.  Cardiovascular:     Rate and Rhythm: Normal rate and regular rhythm.     Pulses: Normal pulses and intact distal pulses.  Pulmonary:     Effort: Pulmonary effort is normal. No respiratory distress.     Breath sounds: Normal breath sounds.  Musculoskeletal:        General: Tenderness and signs of injury present. No swelling, deformity or edema.     Left hand: Laceration present. No swelling or bony tenderness. Decreased range of motion. Normal strength. There is no disruption of two-point discrimination. Normal capillary refill. Normal pulse.     Comments: Patient has lacerations to the palmar aspect of the left ring and fifth fingers.  Bleeding controlled.  No edema.  Patient unable to palmar flex the fifth finger.  No foreign body seen.  See photo attached.    Skin:    General: Skin is warm.     Capillary Refill: Capillary refill takes less than 2 seconds.     Findings: Laceration present. No rash.     Neurological:     Mental Status: She is alert.     Sensory: No sensory deficit.     Motor: No weakness or abnormal muscle tone.     Coordination: Coordination normal.       ED Results / Procedures / Treatments   Labs (all labs ordered are listed, but only abnormal results are displayed)  Labs Reviewed - No data to display  EKG None  Radiology DG Hand Complete Left  Result Date: 10/08/2020 CLINICAL DATA:  The patient suffered a laceration on the left ring finger cleaning a glass table today. Initial encounter. EXAM: LEFT HAND - COMPLETE 3+ VIEW COMPARISON:  None. FINDINGS: No fracture, dislocation or radiopaque foreign body is identified. The PIP joints of the index, long and ring fingers are in flexion. IMPRESSION: No acute bony or joint abnormality.  No foreign body. Flexion at the PIP joints of the ring, long and index fingers is presumably positional. Electronically Signed   By: Drusilla Kanner M.D.   On: 10/08/2020 14:33    Procedures Procedures    LACERATION REPAIR #1 Performed by: Charlita Brian Authorized by: Danamarie Minami Consent: Verbal consent obtained. Risks and benefits: risks, benefits and alternatives were discussed Consent given by: patient Patient identity confirmed: provided demographic data Prepped and Draped in normal sterile fashion Wound explored  Laceration Location: Left ring finger  Laceration Length: 1.5 cm  No Foreign Bodies seen or palpated  Anesthesia: local infiltration  Local anesthetic: lidocaine 2% without epinephrine  Anesthetic total: 3 ml  Irrigation method: syringe Amount of cleaning: standard  Skin closure: 4-0 Prolene  Number of sutures: 3  Technique: Simple interrupted  Patient tolerance: Patient tolerated the procedure well with no immediate  complications.     LACERATION REPAIR #2 Performed by: Setsuko Robins Authorized by: Leoni Goodness Consent: Verbal consent obtained. Risks and benefits: risks, benefits and alternatives were discussed Consent given by: patient Patient identity confirmed: provided demographic data Prepped and Draped in normal sterile fashion Wound explored  Laceration Location: Left pinky finger  Laceration Length: 1.5 cm  No Foreign Bodies seen or palpated  Anesthesia: local infiltration  Local anesthetic: lidocaine 2% without epinephrine  Anesthetic total: 2 ml  Irrigation method: syringe Amount of cleaning: standard  Skin closure: 4-0 Prolene  Number of sutures: 3  Technique: Simple interrupted  Patient tolerance: Patient tolerated the procedure well with no immediate complications.  Wound explored through the entire length and depth of wound.  Unable to visualize flexor tendon.  Patient unable to palmar flex the fifth finger.    Medications Ordered in ED Medications  povidone-iodine (BETADINE) 10 % external solution (  Given by Other 10/08/20 1441)  lidocaine HCl (PF) (XYLOCAINE) 2 % injection 5 mL (5 mLs Intradermal Given by Other 10/08/20 1440)    ED Course  I have reviewed the triage vital signs and the nursing notes.  Pertinent labs & imaging results that were available during my care of the patient were reviewed by me and considered in my medical decision making (see chart for details).    MDM Rules/Calculators/A&P                          Patient here with lacerations to the left ring and little fingers.  No foreign bodies.  Bleeding controlled.  Patient unable to flex the little finger.  Unable to visualize flexor tendon.  Likely flexor tendon injury.  Wounds were thoroughly irrigated with saline and approximated.  Td is up-to-date.  Will consult hand surgery.  XR neg for bony injury.  Discussed findings with Dr. Amanda Pea with hand surgery.  Recommends finger splint,  antibiotics and he will see in office for follow-up. Pt agrees to plan.   Final Clinical Impression(s) / ED Diagnoses Final diagnoses:  Flexor tendon laceration of finger with open wound, initial encounter  Laceration of left ring finger without foreign body without damage to nail, initial encounter    Rx / DC Orders ED Discharge Orders    None       Pauline Ausriplett, Najia Hurlbutt, PA-C 10/09/20 [id number]    Rozelle LoganHorton, Kristie M, DO 10/10/20 [phone]

## 2020-10-13 ENCOUNTER — Encounter (HOSPITAL_COMMUNITY): Payer: Self-pay | Admitting: Orthopedic Surgery

## 2020-10-13 ENCOUNTER — Other Ambulatory Visit (HOSPITAL_COMMUNITY)
Admission: RE | Admit: 2020-10-13 | Discharge: 2020-10-13 | Disposition: A | Discharge status: Home / Self Care | Payer: 59 | Source: Ambulatory Visit | Attending: Orthopedic Surgery | Admitting: Orthopedic Surgery

## 2020-10-13 DIAGNOSIS — Z20822 Contact with and (suspected) exposure to covid-19: Secondary | ICD-10-CM | POA: Diagnosis not present

## 2020-10-13 DIAGNOSIS — Z01812 Encounter for preprocedural laboratory examination: Secondary | ICD-10-CM | POA: Insufficient documentation

## 2020-10-13 NOTE — Progress Notes (Signed)
Spoke with pt for pre-op call. Pt denies cardiac history. Pt states she had gestational HTN and was treated for a while after but no longer is on medication for HTN. Pt is a type 2 diabetic. Pt states she doesn't normally check her blood sugar in the AM. Last A1C was 7.5 on 10/04/20. Instructed pt not to take her Metformin in the AM. Instructed pt to check her blood sugar when she gets up and every 2 hours until she leaves for the hospital. If blood sugar is 70 or below, treat with 1/2 cup of clear juice (apple or cranberry) and recheck blood sugar 15 minutes after drinking juice. If blood sugar continues to be 70 or below, call the Short Stay department and ask to speak to a nurse. Pt voiced understanding.  Covid test done today, result pending.  Pt states she's been in quarantine since the test was done and understands that she stays in quarantine until she comes to the hospital tomorrow.  Pt states she had noticed that her breast felt full, warm to touch yesterday. She states she was able to express some yellow, thick drainage from nipple. She states she did call her GYN and the earliest they can see her is 10/25/20. She states it's much better today, no longer warm to touch and no more drainage. She has been on antibiotics for her finger laceration. I did instruct pt to call Short STay tomorrow if she does start running a fever. She voiced understanding.

## 2020-10-14 ENCOUNTER — Ambulatory Visit (HOSPITAL_COMMUNITY)
Admission: RE | Admit: 2020-10-14 | Discharge: 2020-10-14 | Disposition: A | Discharge status: Home / Self Care | Payer: 59 | Attending: Orthopedic Surgery | Admitting: Orthopedic Surgery

## 2020-10-14 ENCOUNTER — Ambulatory Visit (HOSPITAL_COMMUNITY): Payer: 59 | Admitting: Certified Registered Nurse Anesthetist

## 2020-10-14 ENCOUNTER — Encounter (HOSPITAL_COMMUNITY)
Admission: RE | Disposition: A | Discharge status: Home / Self Care | Payer: Self-pay | Source: Home / Self Care | Attending: Orthopedic Surgery

## 2020-10-14 DIAGNOSIS — S61215A Laceration without foreign body of left ring finger without damage to nail, initial encounter: Secondary | ICD-10-CM | POA: Insufficient documentation

## 2020-10-14 DIAGNOSIS — Z8616 Personal history of COVID-19: Secondary | ICD-10-CM | POA: Insufficient documentation

## 2020-10-14 DIAGNOSIS — X58XXXA Exposure to other specified factors, initial encounter: Secondary | ICD-10-CM | POA: Diagnosis not present

## 2020-10-14 DIAGNOSIS — Z888 Allergy status to other drugs, medicaments and biological substances status: Secondary | ICD-10-CM | POA: Diagnosis not present

## 2020-10-14 DIAGNOSIS — Z825 Family history of asthma and other chronic lower respiratory diseases: Secondary | ICD-10-CM | POA: Diagnosis not present

## 2020-10-14 DIAGNOSIS — Y939 Activity, unspecified: Secondary | ICD-10-CM | POA: Diagnosis not present

## 2020-10-14 DIAGNOSIS — Z79899 Other long term (current) drug therapy: Secondary | ICD-10-CM | POA: Insufficient documentation

## 2020-10-14 DIAGNOSIS — Z833 Family history of diabetes mellitus: Secondary | ICD-10-CM | POA: Insufficient documentation

## 2020-10-14 DIAGNOSIS — S66127A Laceration of flexor muscle, fascia and tendon of left little finger at wrist and hand level, initial encounter: Secondary | ICD-10-CM | POA: Diagnosis not present

## 2020-10-14 DIAGNOSIS — Z881 Allergy status to other antibiotic agents status: Secondary | ICD-10-CM | POA: Insufficient documentation

## 2020-10-14 DIAGNOSIS — Z91013 Allergy to seafood: Secondary | ICD-10-CM | POA: Diagnosis not present

## 2020-10-14 DIAGNOSIS — Z885 Allergy status to narcotic agent status: Secondary | ICD-10-CM | POA: Insufficient documentation

## 2020-10-14 DIAGNOSIS — Z7984 Long term (current) use of oral hypoglycemic drugs: Secondary | ICD-10-CM | POA: Diagnosis not present

## 2020-10-14 DIAGNOSIS — Z793 Long term (current) use of hormonal contraceptives: Secondary | ICD-10-CM | POA: Diagnosis not present

## 2020-10-14 DIAGNOSIS — Z8249 Family history of ischemic heart disease and other diseases of the circulatory system: Secondary | ICD-10-CM | POA: Insufficient documentation

## 2020-10-14 DIAGNOSIS — E119 Type 2 diabetes mellitus without complications: Secondary | ICD-10-CM | POA: Diagnosis not present

## 2020-10-14 HISTORY — DX: COVID-19: U07.1

## 2020-10-14 HISTORY — PX: I & D EXTREMITY: SHX5045

## 2020-10-14 HISTORY — DX: Pneumonia, unspecified organism: J18.9

## 2020-10-14 LAB — POCT PREGNANCY, URINE: Preg Test, Ur: NEGATIVE

## 2020-10-14 LAB — CBC
HCT: 40.3 % (ref 36.0–46.0)
Hemoglobin: 11.9 g/dL — ABNORMAL LOW (ref 12.0–15.0)
MCH: 21.6 pg — ABNORMAL LOW (ref 26.0–34.0)
MCHC: 29.5 g/dL — ABNORMAL LOW (ref 30.0–36.0)
MCV: 73 fL — ABNORMAL LOW (ref 80.0–100.0)
Platelets: 259 10*3/uL (ref 150–400)
RBC: 5.52 MIL/uL — ABNORMAL HIGH (ref 3.87–5.11)
RDW: 14.2 % (ref 11.5–15.5)
WBC: 9.2 10*3/uL (ref 4.0–10.5)
nRBC: 0 % (ref 0.0–0.2)

## 2020-10-14 LAB — SARS CORONAVIRUS 2 (TAT 6-24 HRS): SARS Coronavirus 2: NEGATIVE

## 2020-10-14 LAB — BASIC METABOLIC PANEL
Anion gap: 10 (ref 5–15)
BUN: 8 mg/dL (ref 6–20)
CO2: 25 mmol/L (ref 22–32)
Calcium: 9.5 mg/dL (ref 8.9–10.3)
Chloride: 100 mmol/L (ref 98–111)
Creatinine, Ser: 0.94 mg/dL (ref 0.44–1.00)
GFR, Estimated: >60 mL/min (ref >60)
Glucose, Bld: 102 mg/dL — ABNORMAL HIGH (ref 70–99)
Potassium: 3.9 mmol/L (ref 3.5–5.1)
Sodium: 135 mmol/L (ref 135–145)

## 2020-10-14 LAB — GLUCOSE, CAPILLARY
Glucose-Capillary: 76 mg/dL (ref 70–99)
Glucose-Capillary: 110 mg/dL — ABNORMAL HIGH (ref 70–99)

## 2020-10-14 SURGERY — IRRIGATION AND DEBRIDEMENT EXTREMITY
Anesthesia: General | Site: Hand | Laterality: Left

## 2020-10-14 MED ORDER — MIDAZOLAM HCL 2 MG/2ML IJ SOLN
INTRAMUSCULAR | Status: AC
  Filled 2020-10-14: qty 2

## 2020-10-14 MED ORDER — ONDANSETRON HCL 4 MG/2ML IJ SOLN: 4.0000 mg | Freq: Once | INTRAMUSCULAR | Status: DC | PRN

## 2020-10-14 MED ORDER — LACTATED RINGERS IV SOLN: INTRAVENOUS | Status: DC | PRN

## 2020-10-14 MED ORDER — PROPOFOL 10 MG/ML IV BOLUS
INTRAVENOUS | Status: DC | PRN
  Administered 2020-10-14: 10 mg via INTRAVENOUS
  Administered 2020-10-14: 200 mg via INTRAVENOUS

## 2020-10-14 MED ORDER — ACETAMINOPHEN 10 MG/ML IV SOLN
INTRAVENOUS | Status: DC | PRN
  Administered 2020-10-14: 1000 mg via INTRAVENOUS

## 2020-10-14 MED ORDER — ACETAMINOPHEN 10 MG/ML IV SOLN
INTRAVENOUS | Status: AC
  Filled 2020-10-14: qty 100

## 2020-10-14 MED ORDER — FENTANYL CITRATE (PF) 250 MCG/5ML IJ SOLN
INTRAMUSCULAR | Status: DC | PRN
  Administered 2020-10-14: 50 ug via INTRAVENOUS
  Administered 2020-10-14 (×2): 25 ug via INTRAVENOUS
  Administered 2020-10-14: 50 ug via INTRAVENOUS

## 2020-10-14 MED ORDER — VANCOMYCIN HCL IN DEXTROSE 1-5 GM/200ML-% IV SOLN
INTRAVENOUS | Status: AC
  Filled 2020-10-14: qty 200

## 2020-10-14 MED ORDER — SODIUM CHLORIDE 0.9 % IR SOLN
Status: DC | PRN
  Administered 2020-10-14: 3000 mL

## 2020-10-14 MED ORDER — PROPOFOL 10 MG/ML IV BOLUS
INTRAVENOUS | Status: AC
  Filled 2020-10-14: qty 20

## 2020-10-14 MED ORDER — 0.9 % SODIUM CHLORIDE (POUR BTL) OPTIME
TOPICAL | Status: DC | PRN
  Administered 2020-10-14: 1000 mL

## 2020-10-14 MED ORDER — DEXAMETHASONE SODIUM PHOSPHATE 10 MG/ML IJ SOLN
INTRAMUSCULAR | Status: DC | PRN
  Administered 2020-10-14: 10 mg via INTRAVENOUS

## 2020-10-14 MED ORDER — BUPIVACAINE HCL (PF) 0.25 % IJ SOLN
INTRAMUSCULAR | Status: DC | PRN
  Administered 2020-10-14: 8 mL

## 2020-10-14 MED ORDER — PHENYLEPHRINE HCL-NACL 10-0.9 MG/250ML-% IV SOLN
INTRAVENOUS | Status: DC | PRN
  Administered 2020-10-14: 15 ug/min via INTRAVENOUS

## 2020-10-14 MED ORDER — VANCOMYCIN HCL IN DEXTROSE 1-5 GM/200ML-% IV SOLN
1000.0000 mg | INTRAVENOUS | Status: AC
  Administered 2020-10-14: 1000 mg via INTRAVENOUS

## 2020-10-14 MED ORDER — FENTANYL CITRATE (PF) 250 MCG/5ML IJ SOLN
INTRAMUSCULAR | Status: AC
  Filled 2020-10-14: qty 5

## 2020-10-14 MED ORDER — HYDROMORPHONE HCL 1 MG/ML IJ SOLN
0.2500 mg | INTRAMUSCULAR | Status: DC | PRN
  Administered 2020-10-14: 0.5 mg via INTRAVENOUS

## 2020-10-14 MED ORDER — MIDAZOLAM HCL 2 MG/2ML IJ SOLN
INTRAMUSCULAR | Status: DC | PRN
  Administered 2020-10-14: 2 mg via INTRAVENOUS

## 2020-10-14 MED ORDER — ONDANSETRON HCL 4 MG/2ML IJ SOLN
INTRAMUSCULAR | Status: DC | PRN
  Administered 2020-10-14: 4 mg via INTRAVENOUS

## 2020-10-14 MED ORDER — BUPIVACAINE HCL (PF) 0.25 % IJ SOLN
INTRAMUSCULAR | Status: AC
  Filled 2020-10-14: qty 30

## 2020-10-14 MED ORDER — HYDROMORPHONE HCL 1 MG/ML IJ SOLN
INTRAMUSCULAR | Status: AC
  Filled 2020-10-14: qty 1

## 2020-10-14 MED ORDER — LIDOCAINE 2% (20 MG/ML) 5 ML SYRINGE
INTRAMUSCULAR | Status: DC | PRN
  Administered 2020-10-14: 60 mg via INTRAVENOUS

## 2020-10-14 SURGICAL SUPPLY — 47 items
BNDG CONFORM 2 STRL LF (GAUZE/BANDAGES/DRESSINGS) IMPLANT
BNDG CONFORM 3 STRL LF (GAUZE/BANDAGES/DRESSINGS) ×1 IMPLANT
BNDG ELASTIC 3X5.8 VLCR STR LF (GAUZE/BANDAGES/DRESSINGS) ×1 IMPLANT
BNDG ELASTIC 4X5.8 VLCR STR LF (GAUZE/BANDAGES/DRESSINGS) ×1 IMPLANT
BNDG GAUZE ELAST 4 BULKY (GAUZE/BANDAGES/DRESSINGS) ×4 IMPLANT
CORD BIPOLAR FORCEPS 12FT (ELECTRODE) ×2 IMPLANT
COVER SURGICAL LIGHT HANDLE (MISCELLANEOUS) ×2 IMPLANT
COVER WAND RF STERILE (DRAPES) ×2 IMPLANT
CUFF TOURN SGL QUICK 18X4 (TOURNIQUET CUFF) ×2 IMPLANT
CUFF TOURN SGL QUICK 24 (TOURNIQUET CUFF)
CUFF TRNQT CYL 24X4X16.5-23 (TOURNIQUET CUFF) IMPLANT
DRSG ADAPTIC 3X8 NADH LF (GAUZE/BANDAGES/DRESSINGS) ×2 IMPLANT
GAUZE SPONGE 4X4 12PLY STRL (GAUZE/BANDAGES/DRESSINGS) ×1 IMPLANT
GAUZE SPONGE 4X4 12PLY STRL LF (GAUZE/BANDAGES/DRESSINGS) ×1 IMPLANT
GAUZE XEROFORM 1X8 LF (GAUZE/BANDAGES/DRESSINGS) ×1 IMPLANT
GAUZE XEROFORM 5X9 LF (GAUZE/BANDAGES/DRESSINGS) ×1 IMPLANT
GLOVE BIOGEL M 8.0 STRL (GLOVE) ×2 IMPLANT
GLOVE SS BIOGEL STRL SZ 8 (GLOVE) ×1 IMPLANT
GLOVE SUPERSENSE BIOGEL SZ 8 (GLOVE) ×1
GOWN STRL REUS W/ TWL LRG LVL3 (GOWN DISPOSABLE) ×1 IMPLANT
GOWN STRL REUS W/ TWL XL LVL3 (GOWN DISPOSABLE) ×2 IMPLANT
GOWN STRL REUS W/TWL LRG LVL3 (GOWN DISPOSABLE) ×2
GOWN STRL REUS W/TWL XL LVL3 (GOWN DISPOSABLE) ×4
KIT BASIN OR (CUSTOM PROCEDURE TRAY) ×2 IMPLANT
KIT TURNOVER KIT B (KITS) ×2 IMPLANT
MANIFOLD NEPTUNE II (INSTRUMENTS) ×2 IMPLANT
NDL HYPO 25GX1X1/2 BEV (NEEDLE) IMPLANT
NEEDLE HYPO 25GX1X1/2 BEV (NEEDLE) ×2 IMPLANT
NS IRRIG 1000ML POUR BTL (IV SOLUTION) ×2 IMPLANT
PACK ORTHO EXTREMITY (CUSTOM PROCEDURE TRAY) ×2 IMPLANT
PAD ARMBOARD 7.5X6 YLW CONV (MISCELLANEOUS) ×2 IMPLANT
PAD CAST 4YDX4 CTTN HI CHSV (CAST SUPPLIES) ×1 IMPLANT
PADDING CAST COTTON 4X4 STRL (CAST SUPPLIES) ×2
SET CYSTO W/LG BORE CLAMP LF (SET/KITS/TRAYS/PACK) ×2 IMPLANT
SLING ARM FOAM STRAP XLG (SOFTGOODS) ×1 IMPLANT
SOL PREP POV-IOD 4OZ 10% (MISCELLANEOUS) ×4 IMPLANT
SPONGE LAP 4X18 RFD (DISPOSABLE) ×2 IMPLANT
SUT PROLENE 4 0 PS 2 18 (SUTURE) ×1 IMPLANT
SUT PROLENE 6 0 P 3 18 (SUTURE) ×1 IMPLANT
SUT SUPRAMID 3-0 (SUTURE) ×2 IMPLANT
SWAB CULTURE ESWAB REG 1ML (MISCELLANEOUS) IMPLANT
SYR CONTROL 10ML LL (SYRINGE) ×1 IMPLANT
TOWEL GREEN STERILE (TOWEL DISPOSABLE) ×2 IMPLANT
TOWEL GREEN STERILE FF (TOWEL DISPOSABLE) ×2 IMPLANT
TUBE CONNECTING 12X1/4 (SUCTIONS) ×2 IMPLANT
WATER STERILE IRR 1000ML POUR (IV SOLUTION) ×2 IMPLANT
YANKAUER SUCT BULB TIP NO VENT (SUCTIONS) ×2 IMPLANT

## 2020-10-14 NOTE — H&P (Signed)
Julia Kline is an 35 y.o. female.   Chief Complaint: Laceration small and ring finger with tendon injury to the small finger and disarray of the soft tissue. HPI: Patient presents to the hospital for irrigation debridement and repair small and ring finger left hand.  She has tendon injury to the small finger and injury to the ring finger which required exploration and repair is necessary.  Patient presents for evaluation and treatment of the of their upper extremity predicament. The patient denies neck, back, chest or  abdominal pain. The patient notes that they have no lower extremity problems. The patients primary complaint is noted. We are planning surgical care pathway for the upper extremity.  Past Medical History:  Diagnosis Date  . Acute conjunctivitis of right eye 07/10/2019  . Acute pain of right knee 08/06/2019  . COVID-19   . Diabetes mellitus without complication (HCC)   . Eczema   . Hernia cerebri (HCC)   . History of gestational diabetes 05/02/2017   Normal pp testing 10/2017  . History of severe pre-eclampsia 05/02/2017  . Hypertension    on no medications  . Mastitis 06/18/2019  . Migraine   . PCOS (polycystic ovarian syndrome)   . Pneumonia   . Pregnancy induced hypertension     Past Surgical History:  Procedure Laterality Date  . CESAREAN SECTION  2017   Procedure: CESAREAN DELIVERY ONLY; Surgeon: Marian Sorrow, MD; Location: [facility]; Service: Obstetrics  . CESAREAN SECTION N/A 10/02/2017   Procedure: CESAREAN SECTION;  Surgeon: Tilda Burrow, MD;  Location: [facility] BIRTHING SUITES;  Service: Obstetrics;  Laterality: N/A;  . LAPAROSCOPIC OOPHERECTOMY Left   . LEEP    . OVARIAN CYST REMOVAL    . TONSILLECTOMY    . WISDOM TOOTH EXTRACTION      Family History  Problem Relation Age of Onset  . Diabetes Mother   . Migraines Mother   . Diabetes Father   . Hypertension Father   . Hypertension Maternal Grandmother   . Diabetes Maternal  Grandmother   . Diabetes Maternal Grandfather   . COPD Maternal Grandfather   . Diabetes Paternal Grandmother   . Hypertension Paternal Grandmother   . Diabetes Paternal Grandfather   . Hypertension Paternal Grandfather   . Heart attack Paternal Grandfather   . Asthma Sister    Social History:  reports that she has never smoked. She has never used smokeless tobacco. She reports that she does not drink alcohol and does not use drugs.  Allergies:  Allergies  Allergen Reactions  . Ancef [Cefazolin] Hives and Swelling    Facial and neck swelling; Had reaction after receiving fentanyl and ancef in the OR  . Fentanyl Hives and Swelling    Facial and neck swelling; Had reaction after receiving fentanyl and ancef in the OR  . Shrimp [Shellfish Allergy] Swelling and Other (See Comments)    Swelling of throat  . Morphine And Related Rash  . Pseudoephedrine Hcl Other (See Comments)    Light headed/ dizzy    Medications Prior to Admission  Medication Sig Dispense Refill  . acetaminophen (TYLENOL) 500 MG tablet Take 1,000 mg by mouth every 6 (six) hours as needed for mild pain or moderate pain.    . Dulaglutide (TRULICITY) 3 MG/0.5ML SOPN Inject 3 mg as directed once a week. For diabetes. 2 mL 5  . metFORMIN (GLUCOPHAGE-XR) 500 MG 24 hr tablet Take 2 tablets (1,000 mg total) by mouth 2 (two) times daily with  a meal. For diabetes. (Patient taking differently: Take 500-1,000 mg by mouth See admin instructions. Take 1000 mg in the morning and 500 mg at bedtime) 360 tablet 3  . sulfamethoxazole-trimethoprim (BACTRIM DS) 800-160 MG tablet Take 1 tablet by mouth 2 (two) times daily for 7 days. 14 tablet 0  . triamcinolone ointment (KENALOG) 0.5 % Apply 1 application topically 2 (two) times daily. (Patient taking differently: Apply 1 application topically daily as needed (Dry skin).) 30 g 0  . Continuous Blood Gluc Receiver (FREESTYLE LIBRE 14 DAY READER) DEVI 1 application by Does not apply route every  14 (fourteen) days. 6 each 3  . medroxyPROGESTERone (DEPO-PROVERA) 150 MG/ML injection Inject 1 mL (150 mg total) into the muscle every 3 (three) months. (Patient not taking: No sig reported) 1 mL 2    Results for orders placed or performed during the hospital encounter of 10/14/20 (from the past 48 hour(s))  Pregnancy, urine POC     Status: None   Collection Time: 10/14/20  2:05 PM  Result Value Ref Range   Preg Test, Ur NEGATIVE NEGATIVE    Comment:        THE SENSITIVITY OF THIS METHODOLOGY IS >24 mIU/mL   Glucose, capillary     Status: None   Collection Time: 10/14/20  2:10 PM  Result Value Ref Range   Glucose-Capillary 76 70 - 99 mg/dL    Comment: Glucose reference range applies only to samples taken after fasting for at least 8 hours.   Comment 1 Notify RN    Comment 2 Document in Chart   CBC     Status: Abnormal   Collection Time: 10/14/20  2:45 PM  Result Value Ref Range   WBC 9.2 4.0 - 10.5 K/uL   RBC 5.52 (H) 3.87 - 5.11 MIL/uL   Hemoglobin 11.9 (L) 12.0 - 15.0 g/dL   HCT 41.6 60.6 - 30.1 %   MCV 73.0 (L) 80.0 - 100.0 fL   MCH 21.6 (L) 26.0 - 34.0 pg   MCHC 29.5 (L) 30.0 - 36.0 g/dL   RDW 60.1 09.3 - 23.5 %   Platelets 259 150 - 400 K/uL   nRBC 0.0 0.0 - 0.2 %    Comment: Performed at [facility], [address] [place], [place] [address]  Basic metabolic panel     Status: Abnormal   Collection Time: 10/14/20  2:45 PM  Result Value Ref Range   Sodium 135 135 - 145 mmol/L   Potassium 3.9 3.5 - 5.1 mmol/L   Chloride 100 98 - 111 mmol/L   CO2 25 22 - 32 mmol/L   Glucose, Bld 102 (H) 70 - 99 mg/dL    Comment: Glucose reference range applies only to samples taken after fasting for at least 8 hours.   BUN 8 6 - 20 mg/dL   Creatinine, Ser 0.25 0.44 - 1.00 mg/dL   Calcium 9.5 8.9 - 42.7 mg/dL   GFR, Estimated >06 >23 mL/min    Comment: (NOTE) Calculated using the CKD-EPI Creatinine Equation (2021)    Anion gap 10 5 - 15    Comment: Performed at [facility], [address], [place], [place] [address]   No results found.  Review of Systems  Respiratory: Negative.   Cardiovascular: Negative.   Gastrointestinal: Negative.     There were no vitals taken for this visit. Physical Exam loss of flexion small finger.  Laceration small and ring finger.  Intact refill.  Left  hand is the involved hand without signs of instability dystrophy or infection at this juncture.  The opposite extremity is neurovascular intact.  We will plan for surgical reconstruction left hand small and ring finger.The patient is alert and oriented in no acute distress. The patient complains of pain in the affected upper extremity.  The patient is noted to have a normal HEENT exam. Lung fields show equal chest expansion and no shortness of breath. Abdomen exam is nontender without distention. Lower extremity examination does not show any fracture dislocation or blood clot symptoms. Pelvis is stable and the neck and back are stable and nontender.  Assessment/Plan We will plan for irrigation debridement and repair reconstruction is necessary.  I discussed all issues plans and concerns.  This will be a flexor tendon repair reconstruction of the small finger left hand and a irrigation debridement and repair of structures as necessary about the associated soft tissues of the small and ring finger.  We are planning surgery for your upper extremity. The risk and benefits of surgery to include risk of bleeding, infection, anesthesia,  damage to normal structures and failure of the surgery to accomplish its intended goals of relieving symptoms and restoring function have been discussed in detail. With this in mind we plan to proceed. I have specifically discussed with the patient the pre-and postoperative regime and the dos and don'ts and risk and benefits in great detail. Risk and benefits of surgery also include risk of dystrophy(CRPS), chronic nerve pain, failure of the healing  process to go onto completion and other inherent risks of surgery The relavent the pathophysiology of the disease/injury process, as well as the alternatives for treatment and postoperative course of action has been discussed in great detail with the patient who desires to proceed.  We will do everything in our power to help you (the patient) restore function to the upper extremity. It is a pleasure to see this patient today.   Oletta Cohn III, MD 10/14/2020, 3:54 PM

## 2020-10-14 NOTE — Anesthesia Procedure Notes (Signed)
Procedure Name: LMA Insertion Date/Time: 10/14/2020 4:02 PM Performed by: Dairl Ponder, CRNA Pre-anesthesia Checklist: Patient identified, Emergency Drugs available, Suction available, Patient being monitored and Timeout performed Patient Re-evaluated:Patient Re-evaluated prior to induction Oxygen Delivery Method: Circle system utilized Preoxygenation: Pre-oxygenation with 100% oxygen Induction Type: IV induction LMA: LMA inserted LMA Size: 4.0 Number of attempts: 1 Placement Confirmation: positive ETCO2 and breath sounds checked- equal and bilateral Tube secured with: Tape Dental Injury: Teeth and Oropharynx as per pre-operative assessment

## 2020-10-14 NOTE — Discharge Instructions (Signed)
We recommend that you to take vitamin C 1000 mg a day to promote healing. We also recommend that if you require  pain medicine that you take a stool softener to prevent constipation as most pain medicines will have constipation side effects. We recommend either Peri-Colace or Senokot and recommend that you also consider adding MiraLAX as well to prevent the constipation affects from pain medicine if you are required to use them. These medicines are over the counter and may be purchased at a local pharmacy. A cup of yogurt and a probiotic can also be helpful during the recovery process as the medicines can disrupt your intestinal environment. We will call for your follow-up appointment.  [facility] [address] is through Dr. Carlos Levering office.  Please call Dr. Amanda Pea for any problems on his cell phone at [phone]  Do not move your small finger.

## 2020-10-14 NOTE — Anesthesia Preprocedure Evaluation (Signed)
Anesthesia Evaluation  Patient identified by MRN, date of birth, ID band Patient awake    Reviewed: Allergy & Precautions, NPO status , Patient's Chart, lab work & pertinent test results  Airway Mallampati: III  TM Distance: >3 FB Neck ROM: Full    Dental  (+) Dental Advisory Given   Pulmonary neg pulmonary ROS,    breath sounds clear to auscultation       Cardiovascular hypertension, Pt. on medications  Rhythm:Regular Rate:Normal     Neuro/Psych  Headaches,    GI/Hepatic negative GI ROS, Neg liver ROS,   Endo/Other  diabetes, Type obesity  Renal/GU negative Renal ROS     Musculoskeletal   Abdominal   Peds  Hematology  (+) anemia ,   Anesthesia Other Findings   Reproductive/Obstetrics                             Anesthesia Physical Anesthesia Plan  ASA: III  Anesthesia Plan: General   Post-op Pain Management:    Induction:   PONV Risk Score and Plan: 3 and Dexamethasone, Ondansetron, Treatment may vary due to age or medical condition and Midazolam  Airway Management Planned: LMA  Additional Equipment:   Intra-op Plan:   Post-operative Plan: Extubation in OR  Informed Consent: I have reviewed the patients History and Physical, chart, labs and discussed the procedure including the risks, benefits and alternatives for the proposed anesthesia with the patient or authorized representative who has indicated his/her understanding and acceptance.     Dental advisory given  Plan Discussed with: CRNA  Anesthesia Plan Comments:         Anesthesia Quick Evaluation

## 2020-10-14 NOTE — Anesthesia Procedure Notes (Signed)
Anesthesia Procedure Note R. antecubital vein IV stared in pre-op holding area. 51/2 inch 20 G catheter inserted without difficulty.

## 2020-10-14 NOTE — Op Note (Signed)
Operative note October 14, 2020  Julia Severin MD  Preop diagnosis left hand laceration with flexor digitorum profundus laceration to the small finger and ring finger laceration rule out tendon disruption  Postop diagnosis simple laceration ring finger left hand and complete flexor digitorum profundus laceration left small finger  Operative procedure: #1 irrigation debridement skin subtenons tissue with exploration of the neurovascular structures left ring finger.  Noted intact flexor tendon and neurovascular structures.  #2 simple closure left ring finger #3 irrigation debridement left small finger skin subtenons tissue and tendon with curette knife and scissor to centimeter area.  #4 flexor digitorum profundus repair zone 2 with 6 strand technique utilizing Supramid suture #5Neurolysis radial and ulnar digital nerve left small finger  Anesthesia LMA general  Estimated blood loss minimal  Tourniquet time less than an hour  Operative procedure patient was seen by myself and anesthesia.  She was given a LMA general anesthetic prepped and draped in usual sterile fashion with Betadine scrub after a preop Hibiclens scrub.  Following this timeout was observed and the patient underwent exploration of the ring finger.  Left hand had tourniquet insufflated ring finger was explored.  Neurovascular structures tendon and digital nerves were intact.  This was irrigated and closed with 5-0 Prolene suture.  Following this an extensile Bruner incision was made of the small finger.  Dissection was carried out and irrigation and debridement of skin subtenons tissue and tendon tissue was accomplished about the small finger.  The patient had the excisional debridement performed with curette knife and scissor.  Following this neurolysis and neuroplasty of the digital nerves was performed and the nerves were fortunately intact.  Following this the patient underwent identification of the FDP laceration to the  small finger.  With windowing technique I was able to retrieve the FDP tendon and suture it to the distal stump.  This was done with standard hand surgical technique utilizing a Tang technique with 3 different Supramid strands.  This effectively accomplished a 6 strand repair.  We also performed a 6-0 Prolene epitendinous running suture.  I then very carefully and cautiously irrigated and performed a traction tenodesis by squeezing the forearm musculature to test the construct and the excursion and all looked very well.  I was very pleased with this.  Once this was complete we irrigated copiously and closed wound with Prolene after tourniquet was deflated and hemostasis secured.  The patient tolerated this well.  We discussed these issues in detail.  All questions have been encouraged and answered.  We will plan a flexor tendon protocol program for the patient.  She was placed in standard dressing with a dorsal blocking splint to keep her still.  She understands to refrain from flexing the fingers as I have discussed the importance of proper rehabilitation and taking stress off the repair.  All questions have been addressed.  We will plan to discharge her home on Bactrim DS and oxycodone.  All questions have been encouraged and answered.  Gramig MD

## 2020-10-14 NOTE — Transfer of Care (Signed)
Immediate Anesthesia Transfer of Care Note  Patient: Julia Kline  Procedure(s) Performed: Irrigation and debridement left small and ring finger with repair of tendon (Left Hand)  Patient Location: PACU  Anesthesia Type:General  Level of Consciousness: awake, alert  and oriented  Airway & Oxygen Therapy: Patient Spontanous Breathing  Post-op Assessment: Report given to RN and Post -op Vital signs reviewed and stable  Post vital signs: Reviewed and stable  Last Vitals:  Vitals Value Taken Time  BP 139/83 10/14/20 1723  Temp    Pulse 109 10/14/20 1723  Resp 15 10/14/20 1723  SpO2 100 % 10/14/20 1723  Vitals shown include unvalidated device data.  Last Pain:  Vitals:   10/14/20 1410  PainSc: 0-No pain         Complications: No complications documented.

## 2020-10-15 NOTE — Anesthesia Postprocedure Evaluation (Signed)
Anesthesia Post Note  Patient: Julia Kline  Procedure(s) Performed: Irrigation and debridement left small and ring finger with repair of tendon (Left Hand)     Patient location during evaluation: PACU Anesthesia Type: General Level of consciousness: awake and alert Pain management: pain level controlled Vital Signs Assessment: post-procedure vital signs reviewed and stable Respiratory status: spontaneous breathing, nonlabored ventilation, respiratory function stable and patient connected to nasal cannula oxygen Cardiovascular status: blood pressure returned to baseline and stable Postop Assessment: no apparent nausea or vomiting Anesthetic complications: no   No complications documented.  Last Vitals:  Vitals:   10/14/20 1752 10/14/20 1807  BP: (!) 158/82 (!) 159/90  Pulse: 95 95  Resp: (!) 9 11  Temp:  36.9 C  SpO2: 99% 100%    Last Pain:  Vitals:   10/14/20 1752  PainSc: Asleep                 Kennieth Rad

## 2020-10-16 ENCOUNTER — Encounter (HOSPITAL_COMMUNITY): Payer: Self-pay | Admitting: Orthopedic Surgery

## 2020-10-25 ENCOUNTER — Ambulatory Visit: Payer: 59 | Admitting: Obstetrics and Gynecology

## 2020-11-07 ENCOUNTER — Encounter: Payer: Self-pay | Admitting: Family Medicine

## 2020-11-07 ENCOUNTER — Telehealth (INDEPENDENT_AMBULATORY_CARE_PROVIDER_SITE_OTHER): Payer: 59 | Admitting: Family Medicine

## 2020-11-07 ENCOUNTER — Telehealth: Payer: Self-pay

## 2020-11-07 ENCOUNTER — Other Ambulatory Visit: Payer: Self-pay

## 2020-11-07 DIAGNOSIS — J111 Influenza due to unidentified influenza virus with other respiratory manifestations: Secondary | ICD-10-CM

## 2020-11-07 MED ORDER — BENZONATATE 200 MG PO CAPS: 200.0000 mg | ORAL_CAPSULE | Freq: Three times a day (TID) | ORAL | 1 refills | Status: DC | PRN

## 2020-11-07 MED ORDER — OSELTAMIVIR PHOSPHATE 75 MG PO CAPS: 75.0000 mg | ORAL_CAPSULE | Freq: Two times a day (BID) | ORAL | 0 refills | Status: DC

## 2020-11-07 NOTE — Telephone Encounter (Signed)
[facility] Night - Client TELEPHONE ADVICE RECORD AccessNurse Patient Name: Julia Kline Gender: Female DOB: Aug 04, 1986 Age: 35 Y 23 D Return Phone Number: [phone] (Primary), [phone] (Secondary) [place] [address] Client [facility] Night - Client Client Site  [facility] [place] - Night Physician Vernona Rieger - NP Contact Type Call Who Is Calling Patient / Member / Family / Caregiver Call Type Triage / Clinical Relationship To Patient Self Return Phone Number [phone] (Primary) Chief Complaint Flu Symptom Reason for Call Symptomatic / Request for Health Information Initial Comment Caller states she was exposed to the flu and would like to know if she can have some Tamiflu called in. Translation No Nurse Assessment Nurse: Toni Arthurs, RN, Corrie Dandy Date/Time Lamount Cohen Time): 11/06/2020 4:41:31 PM Confirm and document reason for call. If symptomatic, describe symptoms. ---Caller states she was exposure to flu on Thursday. Her Child was Dx posititve. Cough/No fever/ Chest hurts with coughing only. Fatigue. Symptoms started Saturday . Does the patient have any new or worsening symptoms? ---Yes Will a triage be completed? ---Yes Related visit to physician within the last 2 weeks? ---No Does the PT have any chronic conditions? (i.e. diabetes, asthma, this includes High risk factors for pregnancy, etc.) ---Yes List chronic conditions. ---DM Is the patient pregnant or possibly pregnant? (Ask all females between the ages of 32-55) ---No Is this a behavioral health or substance abuse call? ---No Guidelines Guideline Title Affirmed Question Affirmed Notes Nurse Date/Time (Eastern Time) Influenza - Seasonal [1] Patient is NOT HIGH RISK AND [2] strongly requests antiviral medicine AND [3] flu symptoms present < 48 hours Mervyn Gay 11/06/2020 4:42:34 PM Disp. Time Lamount Cohen  Time) Disposition Final User 11/06/2020 4:46:21 PM Call PCP within 24 Hours Yes Toni Arthurs, RN, Corrie Dandy PLEASE NOTE: All timestamps contained within this report are represented as [place] Standard Time. CONFIDENTIALTY NOTICE: This fax transmission is intended only for the addressee. It contains information that is legally privileged, confidential or otherwise protected from use or disclosure. If you are not the intended recipient, you are strictly prohibited from reviewing, disclosing, copying using or disseminating any of this information or taking any action in reliance on or regarding this information. If you have received this fax in error, please notify us immediately by telephone so that we can arrange for its return to [place]. Phone: [phone], Toll-Free: [phone], Fax: [phone] Page: 2 of 2 Call Id: [id number] Caller Disagree/Comply Comply Caller Understands Yes PreDisposition InappropriateToAsk Care Advice Given Per Guideline CALL PCP WITHIN 24 HOURS: * IF OFFICE WILL BE OPEN: Call the office when it opens tomorrow morning. * You become worse * Difficulty breathing occurs CALL BACK IF: * Antiviral drugs (such as Tamiflu) should be started within 48 hours of the start of flu symptoms to be helpful. If the flu symptoms or fever started more than 48 hours ago, they probably will not help. Adults who are at HIGH RISK (e.g., age > 64 years, pregnant, HIV+, or chronic medical condition) may benefit from starting an antiviral drug more than 48 hours after start of flu symptoms. Comments User: Alphia Moh, RN Date/Time Lamount Cohen Time): 11/06/2020 4:48:02 PM [facility]

## 2020-11-07 NOTE — Progress Notes (Signed)
Patient was exposed to the flu and now thinks she may have it. Patient woke up Sunday morning with a cough, body aches and chills. Last night she begin having a fever. Patient took children's mucinex yesterday and tylenol.

## 2020-11-07 NOTE — Telephone Encounter (Signed)
Pt already has video appt with Dr Para March 11/07/20 at 9:30.

## 2020-11-07 NOTE — Progress Notes (Signed)
Virtual visit completed through WebEx or similar program Patient location: home  Provider location: [place] at [facility], office  Participants: Patient and me (unless stated otherwise below)  Pandemic considerations d/w pt.   Limitations and rationale for visit method d/w patient.  Patient agreed to proceed.   CC: fevers.    HPI:  Known flu exposure.  Daughter dx'd with flu recently, about 4 days ago.  Mult sick contacts.  Daughter is better in the meantime.  Daughter was covid neg.  Patient had prev covid vaccine.    She had covid vaccine.    Sx started yesterday.  Has fever, headache, diffuse aches.  Chills.  Some cough.  She can still take a deep breath.    D/w pt about husband- he doesn't have sx yet.  He has appointment with Dr. Selena Batten tomorrow.  I'll check with Dr. Selena Batten about that.  See notes in his chart.  Meds and allergies reviewed.   ROS: Per HPI unless specifically indicated in ROS section   NAD Speech wnl  A/P: Presumed flu.  Okay for outpatient follow-up.  Discussed options Would start Tamiflu, use tessalon as needed, encouraged rest and fluids.  She agrees with plan.  She will update [place] as needed.

## 2020-11-09 ENCOUNTER — Other Ambulatory Visit: Payer: Self-pay | Admitting: Primary Care

## 2020-11-09 DIAGNOSIS — J111 Influenza due to unidentified influenza virus with other respiratory manifestations: Secondary | ICD-10-CM | POA: Insufficient documentation

## 2020-11-09 DIAGNOSIS — E119 Type 2 diabetes mellitus without complications: Secondary | ICD-10-CM

## 2020-11-09 NOTE — Assessment & Plan Note (Signed)
Presumed flu.  Okay for outpatient follow-up.  Discussed options Would start Tamiflu, use tessalon as needed, encouraged rest and fluids.  She agrees with plan.  She will update [place] as needed.

## 2020-12-19 ENCOUNTER — Other Ambulatory Visit: Payer: Self-pay

## 2020-12-19 DIAGNOSIS — E119 Type 2 diabetes mellitus without complications: Secondary | ICD-10-CM

## 2021-01-02 ENCOUNTER — Other Ambulatory Visit: Payer: Self-pay

## 2021-01-02 ENCOUNTER — Other Ambulatory Visit (INDEPENDENT_AMBULATORY_CARE_PROVIDER_SITE_OTHER): Payer: 59

## 2021-01-02 DIAGNOSIS — E119 Type 2 diabetes mellitus without complications: Secondary | ICD-10-CM | POA: Diagnosis not present

## 2021-01-02 LAB — POCT GLYCOSYLATED HEMOGLOBIN (HGB A1C): Hemoglobin A1C: 6.5 % — AB (ref 4.0–5.6)

## 2021-02-16 ENCOUNTER — Other Ambulatory Visit: Payer: Self-pay

## 2021-02-16 DIAGNOSIS — E119 Type 2 diabetes mellitus without complications: Secondary | ICD-10-CM

## 2021-02-16 NOTE — Telephone Encounter (Signed)
LAST APPOINTMENT DATE: 10/04/2020  NEXT APPOINTMENT DATE: 04/07/2021    LAST REFILL:   QTY:

## 2021-02-17 MED ORDER — TRULICITY 3 MG/0.5ML ~~LOC~~ SOAJ: 3.0000 mg | SUBCUTANEOUS | 0 refills | Status: DC

## 2021-03-10 ENCOUNTER — Ambulatory Visit: Payer: 59 | Admitting: Nurse Practitioner

## 2021-03-10 ENCOUNTER — Other Ambulatory Visit: Payer: Self-pay

## 2021-03-10 ENCOUNTER — Encounter: Payer: Self-pay | Admitting: Nurse Practitioner

## 2021-03-10 VITALS — BP 122/84 | HR 94 | Temp 97.8°F | Ht 69.0 in | Wt 249.0 lb

## 2021-03-10 DIAGNOSIS — R3915 Urgency of urination: Secondary | ICD-10-CM | POA: Diagnosis not present

## 2021-03-10 DIAGNOSIS — N390 Urinary tract infection, site not specified: Secondary | ICD-10-CM | POA: Diagnosis not present

## 2021-03-10 DIAGNOSIS — R350 Frequency of micturition: Secondary | ICD-10-CM | POA: Diagnosis not present

## 2021-03-10 DIAGNOSIS — R8290 Unspecified abnormal findings in urine: Secondary | ICD-10-CM | POA: Diagnosis not present

## 2021-03-10 HISTORY — DX: Urinary tract infection, site not specified: N39.0

## 2021-03-10 LAB — POCT URINALYSIS DIP (MANUAL ENTRY)
Bilirubin, UA: NEGATIVE
Glucose, UA: NEGATIVE mg/dL
Ketones, POC UA: NEGATIVE mg/dL
Nitrite, UA: NEGATIVE
Protein Ur, POC: 30 mg/dL — AB
Spec Grav, UA: >=1.03 — AB (ref 1.010–1.025)
Urobilinogen, UA: 0.2 E.U./dL
pH, UA: 6 (ref 5.0–8.0)

## 2021-03-10 MED ORDER — SULFAMETHOXAZOLE-TRIMETHOPRIM 800-160 MG PO TABS: 1.0000 | ORAL_TABLET | Freq: Two times a day (BID) | ORAL | 0 refills | Status: AC

## 2021-03-10 MED ORDER — FLUCONAZOLE 150 MG PO TABS: 150.0000 mg | ORAL_TABLET | Freq: Once | ORAL | 0 refills | Status: AC

## 2021-03-10 NOTE — Assessment & Plan Note (Addendum)
Diagnosed to the office today.  UA showed blood, small leukocytes, and cloudy appearance.  We will send off for urine culture since history of diabetes and history of UTI in the past.  Signs and symptoms reviewed to watch out for and when to seek reevaluation. Start Bactrim DS, continue for the next 7 days.  We will alert patient if urine culture states bacteria is resistant and will change antimicrobial therapy.

## 2021-03-10 NOTE — Progress Notes (Signed)
Acute Office Visit  Subjective:    Patient ID: Julia Kline, female    DOB: 09-Mar-1986, 35 y.o.   MRN: [id number]  Chief Complaint  Patient presents with   urine odor    HPI Patient is in today for change in the color of her urine, described as cloudy, change of odor noticed on Monday.  Endorses urinary frequency and urgency.  States she is unsure about hematuria as she just recently got off her menstrual cycle.  Last menstrual period was Saturday, July 16.  Denies nausea vomiting fever chills abdominal pain, vaginal itching, vaginal discharge or new lower back pain.  No concern for STI  Hx of at least one UTI in the past, when she was diagnosed with diabetes.  This visit occurred during the SARS-CoV-2 public health emergency.  Safety protocols were in place, including screening questions prior to the visit, additional usage of staff PPE, and extensive cleaning of exam room while observing appropriate contact time as indicated for disinfecting solutions.   Past Medical History:  Diagnosis Date   Acute conjunctivitis of right eye 07/10/2019   Acute pain of right knee 08/06/2019   COVID-19    Diabetes mellitus without complication (HCC)    Eczema    Hernia cerebri (HCC)    History of gestational diabetes 05/02/2017   Normal pp testing 10/2017   History of severe pre-eclampsia 05/02/2017   Hypertension    on no medications   Mastitis 06/18/2019   Migraine    PCOS (polycystic ovarian syndrome)    Pneumonia    Pregnancy induced hypertension     Past Surgical History:  Procedure Laterality Date   CESAREAN SECTION  2017   Procedure: CESAREAN DELIVERY ONLY; Surgeon: Marian Sorrow, MD; Location: [facility]; Service: Obstetrics   CESAREAN SECTION N/A 10/02/2017   Procedure: CESAREAN SECTION;  Surgeon: Tilda Burrow, MD;  Location: [facility]TH[facility] SUITES;  Service: Obstetrics;  Laterality: N/A;   I & D EXTREMITY Left 10/14/2020   Procedure: Irrigation and  debridement left small and ring finger with repair of tendon;  Surgeon: Dominica Severin, MD;  Location: [facility] OR;  Service: Orthopedics;  Laterality: Left;  1 hr Block with IV Sed   LAPAROSCOPIC OOPHERECTOMY Left    LEEP     OVARIAN CYST REMOVAL     TONSILLECTOMY     WISDOM TOOTH EXTRACTION      Family History  Problem Relation Age of Onset   Diabetes Mother    Migraines Mother    Diabetes Father    Hypertension Father    Hypertension Maternal Grandmother    Diabetes Maternal Grandmother    Diabetes Maternal Grandfather    COPD Maternal Grandfather    Diabetes Paternal Grandmother    Hypertension Paternal Grandmother    Diabetes Paternal Grandfather    Hypertension Paternal Grandfather    Heart attack Paternal Grandfather    Asthma Sister     Social History   Socioeconomic History   Marital status: Married    Spouse name: Not on file   Number of children: Not on file   Years of education: Not on file   Highest education level: Not on file  Occupational History   Not on file  Tobacco Use   Smoking status: Never   Smokeless tobacco: Never  Vaping Use   Vaping Use: Never used  Substance and Sexual Activity   Alcohol use: No   Drug use: No   Sexual activity: Yes  Birth control/protection: Injection  Other Topics Concern   Not on file  Social History Narrative   Married.   2 children.   Works at [employer].   Social Determinants of Health   Financial Resource Strain: Not on file  Food Insecurity: Not on file  Transportation Needs: Not on file  Physical Activity: Not on file  Stress: Not on file  Social Connections: Not on file  Intimate Partner Violence: Not on file    Outpatient Medications Prior to Visit  Medication Sig Dispense Refill   acetaminophen (TYLENOL) 500 MG tablet Take 1,000 mg by mouth every 6 (six) hours as needed for mild pain or moderate pain.     Dulaglutide (TRULICITY) 3 MG/0.5ML SOPN Inject 3 mg as directed once a week. For  diabetes. 6 mL 0   metFORMIN (GLUCOPHAGE-XR) 500 MG 24 hr tablet Take 2 tablets (1,000 mg total) by mouth 2 (two) times daily with a meal. For diabetes. (Patient taking differently: Take 500-1,000 mg by mouth See admin instructions. Take 1000 mg in the morning and 500 mg at bedtime) 360 tablet 3   triamcinolone ointment (KENALOG) 0.5 % Apply 1 application topically 2 (two) times daily. (Patient taking differently: Apply 1 application topically daily as needed (Dry skin).) 30 g 0   benzonatate (TESSALON) 200 MG capsule Take 1 capsule (200 mg total) by mouth 3 (three) times daily as needed. 30 capsule 1   oseltamivir (TAMIFLU) 75 MG capsule Take 1 capsule (75 mg total) by mouth 2 (two) times daily. 10 capsule 0   No facility-administered medications prior to visit.    Allergies  Allergen Reactions   Ancef [Cefazolin] Hives and Swelling    Facial and neck swelling; Had reaction after receiving fentanyl and ancef in the OR   Fentanyl Hives and Swelling    Facial and neck swelling; Had reaction after receiving fentanyl and ancef in the OR   Shrimp [Shellfish Allergy] Swelling and Other (See Comments)    Swelling of throat   Morphine And Related Rash   Pseudoephedrine Hcl Other (See Comments)    Light headed/ dizzy    Review of Systems  Constitutional:  Negative for chills and fever.  Gastrointestinal:  Negative for abdominal pain, nausea and vomiting.  Genitourinary:  Positive for frequency and urgency. Negative for difficulty urinating, dysuria, flank pain, hematuria, vaginal discharge and vaginal pain.      Objective:    Physical Exam Vitals and nursing note reviewed.  Constitutional:      Appearance: Normal appearance. She is obese.  Cardiovascular:     Rate and Rhythm: Normal rate and regular rhythm.     Heart sounds: Normal heart sounds.  Pulmonary:     Effort: Pulmonary effort is normal.     Breath sounds: Normal breath sounds.  Abdominal:     General: Bowel sounds are  normal.     Tenderness: There is no abdominal tenderness. There is no right CVA tenderness or left CVA tenderness.  Neurological:     Mental Status: She is alert.    BP 122/84   Pulse 94   Temp 97.8 F (36.6 C) (Temporal)   Ht 5\' 9"  (1.753 m)   Wt 249 lb (112.9 kg)   SpO2 92%   BMI 36.77 kg/m  Wt Readings from Last 3 Encounters:  03/10/21 249 lb (112.9 kg)  11/07/20 243 lb (110.2 kg)  10/08/20 243 lb (110.2 kg)    Health Maintenance Due  Topic Date Due  Hepatitis C Screening  Never done   PAP SMEAR-Modifier  03/01/2020   COVID-19 Vaccine (3 - Booster for Pfizer series) 09/08/2020   FOOT EXAM  12/20/2020   URINE MICROALBUMIN  12/20/2020     Lab Results  Component Value Date   HGBA1C 6.5 (A) 01/02/2021       Assessment & Plan:   Problem List Items Addressed This Visit   None Visit Diagnoses     Abnormal urine odor    -  Primary   Relevant Orders   POCT urinalysis dipstick        No orders of the defined types were placed in this encounter.    Audria Nine, NP

## 2021-03-10 NOTE — Assessment & Plan Note (Signed)
Noticed on Monday, July 18.  Has continued since then

## 2021-03-10 NOTE — Assessment & Plan Note (Signed)
Noticed on Monday, July 18.  Has continued since then 

## 2021-03-10 NOTE — Patient Instructions (Signed)
Take the antibiotics as prescribed Watch out for fever, chills, vomiting, and new lower back pain Your urine culture will result on the My Chart Portal and I will address it with a comment. Let me know if I can be of any more service  Thanks for allowing me to care for you today.

## 2021-03-10 NOTE — Assessment & Plan Note (Signed)
Noticed on Monday, July 18.  Has continued since then.  Noticed in conjunction with urinary frequency and urgency.

## 2021-03-12 LAB — URINE CULTURE
MICRO NUMBER:: 12152423
SPECIMEN QUALITY:: ADEQUATE

## 2021-04-07 ENCOUNTER — Ambulatory Visit: Payer: 59 | Admitting: Primary Care

## 2021-04-18 ENCOUNTER — Ambulatory Visit: Payer: 59 | Admitting: Primary Care

## 2021-05-08 ENCOUNTER — Telehealth: Payer: 59 | Admitting: Physician Assistant

## 2021-05-08 DIAGNOSIS — H109 Unspecified conjunctivitis: Secondary | ICD-10-CM | POA: Diagnosis not present

## 2021-05-08 MED ORDER — POLYMYXIN B-TRIMETHOPRIM 10000-0.1 UNIT/ML-% OP SOLN: 1.0000 [drp] | OPHTHALMIC | 0 refills | Status: DC

## 2021-05-08 NOTE — Patient Instructions (Signed)
Julia Kline, thank you for joining Margaretann Loveless, PA-C for today's virtual visit.  While this provider is not your primary care provider (PCP), if your PCP is located in our provider database this encounter information will be shared with them immediately following your visit.  Consent: (Patient) Julia Kline provided verbal consent for this virtual visit at the beginning of the encounter.  Current Medications:  Current Outpatient Medications:    acetaminophen (TYLENOL) 500 MG tablet, Take 1,000 mg by mouth every 6 (six) hours as needed for mild pain or moderate pain., Disp: , Rfl:    Dulaglutide (TRULICITY) 3 MG/0.5ML SOPN, Inject 3 mg as directed once a week. For diabetes., Disp: 6 mL, Rfl: 0   metFORMIN (GLUCOPHAGE-XR) 500 MG 24 hr tablet, Take 2 tablets (1,000 mg total) by mouth 2 (two) times daily with a meal. For diabetes. (Patient taking differently: Take 500-1,000 mg by mouth See admin instructions. Take 1000 mg in the morning and 500 mg at bedtime), Disp: 360 tablet, Rfl: 3   triamcinolone ointment (KENALOG) 0.5 %, Apply 1 application topically 2 (two) times daily. (Patient taking differently: Apply 1 application topically daily as needed (Dry skin).), Disp: 30 g, Rfl: 0   Medications ordered in this encounter:  No orders of the defined types were placed in this encounter.    *If you need refills on other medications prior to your next appointment, please contact your pharmacy*  Follow-Up: Call back or seek an in-person evaluation if the symptoms worsen or if the condition fails to improve as anticipated.  Other Instructions Bacterial Conjunctivitis, Adult Bacterial conjunctivitis is an infection of your conjunctiva. This is the clear membrane that covers the white part of your eye and the inner part of your eyelid. This infection can make your eye: Red or pink. Itchy or irritated. This condition spreads easily from person to person (is contagious) and  from one eye to the other eye. What are the causes? This condition is caused by germs (bacteria). You may get the infection if you come into close contact with: A person who has the infection. Items that have germs on them (are contaminated), such as face towels, contact lens solution, or eye makeup. What increases the risk? You are more likely to get this condition if: You have contact with people who have the infection. You wear contact lenses. You have a sinus infection. You have had a recent eye injury or surgery. You have a weak body defense system (immune system). You have dry eyes. What are the signs or symptoms?  Thick, yellowish discharge from the eye. Tearing or watery eyes. Itchy eyes. Burning feeling in your eyes. Eye redness. Swollen eyelids. Blurred vision. How is this treated?  Antibiotic eye drops or ointment. Antibiotic medicine taken by mouth. This is used for infections that do not get better with drops or ointment or that last more than 10 days. Cool, wet cloths placed on the eyes. Artificial tears used 2-6 times a day. Follow these instructions at home: Medicines Take or apply your antibiotic medicine as told by your doctor. Do not stop using it even if you start to feel better. Take or apply over-the-counter and prescription medicines only as told by your doctor. Do not touch your eyelid with the eye-drop bottle or the ointment tube. Managing discomfort Wipe any fluid from your eye with a warm, wet washcloth or a cotton ball. Place a clean, cool, wet cloth on your eye. Do this for 10-20 minutes,  3-4 times a day. General instructions Do not wear contacts until the infection is gone. Wear glasses until your doctor says it is okay to wear contacts again. Do not wear eye makeup until the infection is gone. Throw away old eye makeup. Change or wash your pillowcase every day. Do not share towels or washcloths. Wash your hands often with soap and water for at  least 20 seconds and especially before touching your face or eyes. Use paper towels to dry your hands. Do not touch or rub your eyes. Do not drive or use heavy machinery if your vision is blurred. Contact a doctor if: You have a fever. You do not get better after 10 days. Get help right away if: You have a fever and your symptoms get worse all of a sudden. You have very bad pain when you move your eye. Your face: Hurts. Is red. Is swollen. You have sudden loss of vision. Summary Bacterial conjunctivitis is an infection of your conjunctiva. This infection spreads easily from person to person. Wash your hands often with soap and water for at least 20 seconds and especially before touching your face or eyes. Use paper towels to dry your hands. Take or apply your antibiotic medicine as told by your doctor. Contact a doctor if you have a fever or you do not get better after 10 days. This information is not intended to replace advice given to you by your health care provider. Make sure you discuss any questions you have with your health care provider. Document Revised: 11/16/2020 Document Reviewed: 11/16/2020 Elsevier Patient Education  2022 [employer].    If you have been instructed to have an in-person evaluation today at a local Urgent Care facility, please use the link below. It will take you to a list of all of our available [facility], including address, phone number and hours of operation. Please do not delay care.  [facility]  If you or a family member do not have a primary care provider, use the link below to schedule a visit and establish care. When you choose a [facility] primary care physician or advanced practice provider, you gain a long-term partner in health. Find a Primary Care Provider  Learn more about [facility]'s in-office and virtual care options:  - Get Care Now

## 2021-05-08 NOTE — Progress Notes (Signed)
Virtual Visit Consent   Julia Kline, you are scheduled for a virtual visit with a [facility] provider today.     Just as with appointments in the office, your consent must be obtained to participate.  Your consent will be active for this visit and any virtual visit you may have with one of our providers in the next 365 days.     If you have a MyChart account, a copy of this consent can be sent to you electronically.  All virtual visits are billed to your insurance company just like a traditional visit in the office.    As this is a virtual visit, video technology does not allow for your provider to perform a traditional examination.  This may limit your provider's ability to fully assess your condition.  If your provider identifies any concerns that need to be evaluated in person or the need to arrange testing (such as labs, EKG, etc.), we will make arrangements to do so.     Although advances in technology are sophisticated, we cannot ensure that it will always work on either your end or our end.  If the connection with a video visit is poor, the visit may have to be switched to a telephone visit.  With either a video or telephone visit, we are not always able to ensure that we have a secure connection.     I need to obtain your verbal consent now.   Are you willing to proceed with your visit today?    Julia Kline has provided verbal consent on 05/08/2021 for a virtual visit (video or telephone).   Margaretann Loveless, PA-C   Date: 05/08/2021 3:36 PM   Virtual Visit via Video Note   I, Margaretann Loveless, connected with  Julia Kline  ([id number], 09-25-1985) on 05/08/21 at  3:30 PM EDT by a video-enabled telemedicine application and verified that I am speaking with the correct person using two identifiers.  Location: Patient: Virtual Visit Location Patient: Other: work, isolated Provider: Engineer, mining Provider: Home Office   I discussed the  limitations of evaluation and management by telemedicine and the availability of in person appointments. The patient expressed understanding and agreed to proceed.    History of Present Illness: Julia Kline is a 35 y.o. who identifies as a female who was assigned female at birth, and is being seen today for possible conjunctivitis.  HPI: Conjunctivitis  The current episode started 5 to 7 days ago. The onset was gradual. The problem occurs frequently. The problem has been gradually worsening. The problem is mild. Associated symptoms include eye itching, photophobia (outside in sunlight), eye discharge and eye redness. Pertinent negatives include no fever, no decreased vision, no double vision, no congestion, no ear discharge, no ear pain, no headaches, no rhinorrhea, no swollen glands and no eye pain. The eye pain is mild. Both eyes are affected. The eye pain is not associated with movement. The eyelid exhibits no abnormality.     Problems:  Patient Active Problem List   Diagnosis Date Noted   Abnormal urine odor 03/10/2021   Complicated UTI (urinary tract infection) 03/10/2021   Urine frequency 03/10/2021   Urgency of urination 03/10/2021   Influenza 11/09/2020   Skin lesions 09/12/2020   Chest pain 05/10/2020   Hidradenitis axillaris 04/20/2020   Impingement syndrome of left shoulder 02/16/2020   Type 2 diabetes mellitus (HCC) 12/21/2019   History of COVID-19 12/21/2019   Anemia 10/14/2019  Hypertension 10/13/2019   Breakthrough bleeding on depo provera 10/13/2019   Migraines 04/01/2018   Abnormal uterine bleeding (AUB) 01/06/2018   Obesity (BMI 30-39.9) 08/14/2017    Allergies:  Allergies  Allergen Reactions   Ancef [Cefazolin] Hives and Swelling    Facial and neck swelling; Had reaction after receiving fentanyl and ancef in the OR   Fentanyl Hives and Swelling    Facial and neck swelling; Had reaction after receiving fentanyl and ancef in the OR   Shrimp [Shellfish  Allergy] Swelling and Other (See Comments)    Swelling of throat   Morphine And Related Rash   Pseudoephedrine Hcl Other (See Comments)    Light headed/ dizzy   Medications:  Current Outpatient Medications:    trimethoprim-polymyxin b (POLYTRIM) ophthalmic solution, Place 1 drop into both eyes every 4 (four) hours., Disp: 10 mL, Rfl: 0   acetaminophen (TYLENOL) 500 MG tablet, Take 1,000 mg by mouth every 6 (six) hours as needed for mild pain or moderate pain., Disp: , Rfl:    Dulaglutide (TRULICITY) 3 MG/0.5ML SOPN, Inject 3 mg as directed once a week. For diabetes., Disp: 6 mL, Rfl: 0   metFORMIN (GLUCOPHAGE-XR) 500 MG 24 hr tablet, Take 2 tablets (1,000 mg total) by mouth 2 (two) times daily with a meal. For diabetes. (Patient taking differently: Take 500-1,000 mg by mouth See admin instructions. Take 1000 mg in the morning and 500 mg at bedtime), Disp: 360 tablet, Rfl: 3   triamcinolone ointment (KENALOG) 0.5 %, Apply 1 application topically 2 (two) times daily. (Patient taking differently: Apply 1 application topically daily as needed (Dry skin).), Disp: 30 g, Rfl: 0  Observations/Objective: Patient is well-developed, well-nourished in no acute distress.  Resting comfortably at work Head is normocephalic, atraumatic.  No labored breathing.  Speech is clear and coherent with logical content.  Patient is alert and oriented at baseline.    Assessment and Plan: 1. Bacterial conjunctivitis - trimethoprim-polymyxin b (POLYTRIM) ophthalmic solution; Place 1 drop into both eyes every 4 (four) hours.  Dispense: 10 mL; Refill: 0  - Worsening symptoms over a week - Will treat as bacterial conjunctivitis due to how long symptoms have been present - Polytrim provided - Seek further evaluation in person if symptoms worsen or fail to improve  Follow Up Instructions: I discussed the assessment and treatment plan with the patient. The patient was provided an opportunity to ask questions and all  were answered. The patient agreed with the plan and demonstrated an understanding of the instructions.  A copy of instructions were sent to the patient via MyChart.  The patient was advised to call back or seek an in-person evaluation if the symptoms worsen or if the condition fails to improve as anticipated.  Time:  I spent 10 minutes with the patient via telehealth technology discussing the above problems/concerns.    Margaretann Loveless, PA-C

## 2021-05-09 ENCOUNTER — Telehealth: Payer: 59 | Admitting: Family Medicine

## 2021-05-14 ENCOUNTER — Telehealth: Payer: 59 | Admitting: Emergency Medicine

## 2021-05-14 DIAGNOSIS — H5710 Ocular pain, unspecified eye: Secondary | ICD-10-CM

## 2021-05-14 NOTE — Patient Instructions (Signed)
  Julia Kline, thank you for joining [name], NP for today's virtual visit.  While this provider is not your primary care provider (PCP), if your PCP is located in our provider database this encounter information will be shared with them immediately following your visit.  Consent: (Patient) [name] provided verbal consent for this virtual visit at the beginning of the encounter.  Current Medications:  Current Outpatient Medications:    acetaminophen (TYLENOL) 500 MG tablet, Take 1,000 mg by mouth every 6 (six) hours as needed for mild pain or moderate pain., Disp: , Rfl:    Dulaglutide (TRULICITY) 3 MG/0.5ML SOPN, Inject 3 mg as directed once a week. For diabetes., Disp: 6 mL, Rfl: 0   metFORMIN (GLUCOPHAGE-XR) 500 MG 24 hr tablet, Take 2 tablets (1,000 mg total) by mouth 2 (two) times daily with a meal. For diabetes. (Patient taking differently: Take 500-1,000 mg by mouth See admin instructions. Take 1000 mg in the morning and 500 mg at bedtime), Disp: 360 tablet, Rfl: 3   triamcinolone ointment (KENALOG) 0.5 %, Apply 1 application topically 2 (two) times daily. (Patient taking differently: Apply 1 application topically daily as needed (Dry skin).), Disp: 30 g, Rfl: 0   trimethoprim-polymyxin b (POLYTRIM) ophthalmic solution, Place 1 drop into both eyes every 4 (four) hours., Disp: 10 mL, Rfl: 0   Medications ordered in this encounter:  No orders of the defined types were placed in this encounter.    *If you need refills on other medications prior to your next appointment, please contact your pharmacy*  Follow-Up: Call back or seek an in-person evaluation if the symptoms worsen or if the condition fails to improve as anticipated.  Other Instructions I think you may just have some fluid under the outer lining of your eye due to your recent eye infection but I'm worried you may have a scratch in your eye and I cannot see it well enough over video. Please get seen  in person, such as at an urgent care.    If you have been instructed to have an in-person evaluation today at a local Urgent Care facility, please use the link below. It will take you to a list of all of our available [facility], including address, phone number and hours of operation. Please do not delay care.  [facility]  If you or a family member do not have a primary care provider, use the link below to schedule a visit and establish care. When you choose a [facility] primary care physician or advanced practice provider, you gain a long-term partner in health. Find a Primary Care Provider  Learn more about [facility]'s in-office and virtual care options: [facility] - Get Care Now

## 2021-05-14 NOTE — Progress Notes (Signed)
Julia Kline Virtual Visit Consent   Julia Kline, you are scheduled for a virtual visit with a [facility] provider today.     Just as with appointments in the office, your consent must be obtained to participate.  Your consent will be active for this visit and any virtual visit you may have with one of our providers in the next 365 days.     If you have a MyChart account, a copy of this consent can be sent to you electronically.  All virtual visits are billed to your insurance company just like a traditional visit in the office.    As this is a virtual visit, video technology does not allow for your provider to perform a traditional examination.  This may limit your provider's ability to fully assess your condition.  If your provider identifies any concerns that need to be evaluated in person or the need to arrange testing (such as labs, EKG, etc.), we will make arrangements to do so.     Although advances in technology are sophisticated, we cannot ensure that it will always work on either your end or our end.  If the connection with a video visit is poor, the visit may have to be switched to a telephone visit.  With either a video or telephone visit, we are not always able to ensure that we have a secure connection.     I need to obtain your verbal consent now.   Are you willing to proceed with your visit today?    Julia Kline has provided verbal consent on 05/14/2021 for a virtual visit (video or telephone).   [name], NP   Date: 05/14/2021 12:09 PM   Virtual Visit via Video Note   I, [name], connected with  Julia Kline  ([id number], 1985-09-06) on 05/14/21 at 12:00 PM EDT by a video-enabled telemedicine application and verified that I am speaking with the correct person using two identifiers.  Location: Patient: Virtual Visit Location Patient: Other: her car parked in [place] Provider: Virtual Visit Location Provider: Home Office   I discussed the  limitations of evaluation and management by telemedicine and the availability of in person appointments. The patient expressed understanding and agreed to proceed.    History of Present Illness: Julia Kline is a 35 y.o. who identifies as a female who was assigned female at birth, and is being seen today for a bubble in her eye.  Pt reports she had pink earlier this week, feels that has resolved after treatment but yesterday noticed a "bubble" in one eye.  She feels like something is in her eye. Denies known injury to eye.    HPI: HPI  Problems:  Patient Active Problem List   Diagnosis Date Noted   Abnormal urine odor 03/10/2021   Complicated UTI (urinary tract infection) 03/10/2021   Urine frequency 03/10/2021   Urgency of urination 03/10/2021   Influenza 11/09/2020   Skin lesions 09/12/2020   Chest pain 05/10/2020   Hidradenitis axillaris 04/20/2020   Impingement syndrome of left shoulder 02/16/2020   Type 2 diabetes mellitus (HCC) 12/21/2019   History of COVID-19 12/21/2019   Anemia 10/14/2019   Hypertension 10/13/2019   Breakthrough bleeding on depo provera 10/13/2019   Migraines 04/01/2018   Abnormal uterine bleeding (AUB) 01/06/2018   Obesity (BMI 30-39.9) 08/14/2017    Allergies:  Allergies  Allergen Reactions   Ancef [Cefazolin] Hives and Swelling    Facial and neck swelling; Had reaction  after receiving fentanyl and ancef in the OR   Fentanyl Hives and Swelling    Facial and neck swelling; Had reaction after receiving fentanyl and ancef in the OR   Shrimp [Shellfish Allergy] Swelling and Other (See Comments)    Swelling of throat   Morphine And Related Rash   Pseudoephedrine Hcl Other (See Comments)    Light headed/ dizzy   Medications:  Current Outpatient Medications:    acetaminophen (TYLENOL) 500 MG tablet, Take 1,000 mg by mouth every 6 (six) hours as needed for mild pain or moderate pain., Disp: , Rfl:    Dulaglutide (TRULICITY) 3 MG/0.5ML SOPN,  Inject 3 mg as directed once a week. For diabetes., Disp: 6 mL, Rfl: 0   metFORMIN (GLUCOPHAGE-XR) 500 MG 24 hr tablet, Take 2 tablets (1,000 mg total) by mouth 2 (two) times daily with a meal. For diabetes. (Patient taking differently: Take 500-1,000 mg by mouth See admin instructions. Take 1000 mg in the morning and 500 mg at bedtime), Disp: 360 tablet, Rfl: 3   triamcinolone ointment (KENALOG) 0.5 %, Apply 1 application topically 2 (two) times daily. (Patient taking differently: Apply 1 application topically daily as needed (Dry skin).), Disp: 30 g, Rfl: 0   trimethoprim-polymyxin b (POLYTRIM) ophthalmic solution, Place 1 drop into both eyes every 4 (four) hours., Disp: 10 mL, Rfl: 0  Observations/Objective: Patient is well-developed, well-nourished in no acute distress.  Resting comfortably  in her car.  Head is normocephalic, atraumatic.  No labored breathing.  Speech is clear and coherent with logical content.  Patient is alert and oriented at baseline.  I am not able to see the bubble in her eye over video. Conjunctiva do not look injected.   Assessment and Plan: 1. Pain in eye, unspecified laterality  I suspect this is chemosis related to earlier conjunctivtis. However, I am not able to see sufficiently over video and pt has foreign body sensation. I advised her to be seen in person.    Follow Up Instructions: I discussed the assessment and treatment plan with the patient. The patient was provided an opportunity to ask questions and all were answered. The patient agreed with the plan and demonstrated an understanding of the instructions.  A copy of instructions were sent to the patient via MyChart unless otherwise noted below.    The patient was advised to call back or seek an in-person evaluation if the symptoms worsen or if the condition fails to improve as anticipated.  Time:  I spent 7 minutes with the patient via telehealth technology discussing the above problems/concerns.     [name], NP

## 2021-06-05 ENCOUNTER — Ambulatory Visit: Payer: Self-pay

## 2021-06-05 NOTE — Telephone Encounter (Signed)
Please call to triage. Message sent in teams to let triage know as well.

## 2021-06-05 NOTE — Telephone Encounter (Signed)
I spoke with pt; pt said starting 06/03/21 pt has pressure feeling that comes and goes at crown of head (no H/A); when pt has pressure feeling in head also feels lightheaded and feels like might pass out but pt has not passed out. Pt slept a lot on 06/03/21 and 06/04/21 (pt slept her usual amt at nighttime plus wanting to sleep more during the day.) also starting on 06/03/21 was pt feeling exhausted. Since 06/03/21 pts FBS averaged 189.pt said on and off pt has been taking Metformin 500 mg twice a day and when asked when pt started that she said she she does not really know because she is not consistent taking her meds. Pt said when her ins changed it did not cover the trulicity and was too expensive for pt to continue taking. Pt will ck with new ins to see if there is a comparable med to trulicity that her new ins will cover and pt will let Allayne Gitelman NP know. Pt is not on BP med. And today BP is 159/98. No CP and no SOB . Pt does not feel well today. I scheduled pt appt at [facility] in [facility] 06/05/21 at 6 PM. Pt did not want to go to ED but ED precautions also given and pt voiced understanding. Sending note to Allayne Gitelman NP who is not in office and Joellen CMA. Will send note to Dr Selena Batten who is in office. Will also teams Joellen.

## 2021-06-05 NOTE — Telephone Encounter (Signed)
Agree with UC today. Will keep for PCP review as likely needs pcp f/u

## 2021-06-07 ENCOUNTER — Other Ambulatory Visit: Payer: Self-pay

## 2021-06-07 ENCOUNTER — Encounter: Payer: Self-pay | Admitting: Primary Care

## 2021-06-07 ENCOUNTER — Ambulatory Visit (INDEPENDENT_AMBULATORY_CARE_PROVIDER_SITE_OTHER): Payer: 59 | Admitting: Primary Care

## 2021-06-07 VITALS — BP 126/82 | HR 88 | Temp 97.6°F | Ht 69.0 in | Wt 249.0 lb

## 2021-06-07 DIAGNOSIS — E119 Type 2 diabetes mellitus without complications: Secondary | ICD-10-CM

## 2021-06-07 DIAGNOSIS — E1165 Type 2 diabetes mellitus with hyperglycemia: Secondary | ICD-10-CM | POA: Diagnosis not present

## 2021-06-07 DIAGNOSIS — Z23 Encounter for immunization: Secondary | ICD-10-CM

## 2021-06-07 LAB — POCT GLYCOSYLATED HEMOGLOBIN (HGB A1C): Hemoglobin A1C: 7.8 % — AB (ref 4.0–5.6)

## 2021-06-07 LAB — MICROALBUMIN / CREATININE URINE RATIO
Creatinine,U: 25.8 mg/dL
Microalb Creat Ratio: 2.7 mg/g (ref 0.0–30.0)
Microalb, Ur: <0.7 mg/dL (ref 0.0–1.9)

## 2021-06-07 MED ORDER — GLIPIZIDE ER 5 MG PO TB24: 5.0000 mg | ORAL_TABLET | Freq: Every day | ORAL | 1 refills | Status: DC

## 2021-06-07 MED ORDER — METFORMIN HCL ER 500 MG PO TB24: 500.0000 mg | ORAL_TABLET | Freq: Every day | ORAL | 1 refills | Status: DC

## 2021-06-07 NOTE — Progress Notes (Signed)
Subjective:    Patient ID: Julia Kline, female    DOB: Apr 12, 1986, 35 y.o.   MRN: [id number]  HPI  Julia Kline is a very pleasant 35 y.o. female with a history of type 2 diabetes, hypertension, obesity who presents today to discuss hyperglycemia.  She called our office two days ago with reports of head pressure, feeling lightheaded, fatigue. She endorsed inconsistency with taking diabetes medications, also stated that Trulicity was not covered by insurance when ordered in July 2022, patient did not notify our office. She endorsed hypertension with BP reading of 159/98, not on treatment.   Current diabetes medications include: Metformin XR 500 mg- 1000 mg BID, Trulicity 3 mg weekly.   She takes metformin once weekly on average, has been taking 1000 mg of metformin twice daily for the last week.   She has been taking glipizide 5 mg that belongs to her mother.  She is checking her blood glucose 2 times daily and is getting readings of 160-180.   Last A1C: 6.5 in May 2022, 7.8 today  Last Eye Exam: UTD Last Foot Exam: Due Urine Microalbumin: Due Statin: None  Dietary changes since last visit: Poor diet.    Exercise: No regular exercise.   BP Readings from Last 3 Encounters:  06/07/21 126/82  03/10/21 122/84  10/14/20 (!) 159/90       Review of Systems  Eyes:  Positive for visual disturbance.  Respiratory:  Negative for shortness of breath.   Cardiovascular:  Negative for chest pain.  Neurological:  Negative for numbness.        Past Medical History:  Diagnosis Date   Acute conjunctivitis of right eye 07/10/2019   Acute pain of right knee 08/06/2019   COVID-19    Diabetes mellitus without complication (HCC)    Eczema    Hernia cerebri (HCC)    History of gestational diabetes 05/02/2017   Normal pp testing 10/2017   History of severe pre-eclampsia 05/02/2017   Hypertension    on no medications   Mastitis 06/18/2019   Migraine    PCOS  (polycystic ovarian syndrome)    Pneumonia    Pregnancy induced hypertension     Social History   Socioeconomic History   Marital status: Married    Spouse name: Not on file   Number of children: Not on file   Years of education: Not on file   Highest education level: Not on file  Occupational History   Not on file  Tobacco Use   Smoking status: Never   Smokeless tobacco: Never  Vaping Use   Vaping Use: Never used  Substance and Sexual Activity   Alcohol use: No   Drug use: No   Sexual activity: Yes    Birth control/protection: Injection  Other Topics Concern   Not on file  Social History Narrative   Married.   2 children.   Works at [employer].   Social Determinants of Health   Financial Resource Strain: Not on file  Food Insecurity: Not on file  Transportation Needs: Not on file  Physical Activity: Not on file  Stress: Not on file  Social Connections: Not on file  Intimate Partner Violence: Not on file    Past Surgical History:  Procedure Laterality Date   CESAREAN SECTION  2017   Procedure: CESAREAN DELIVERY ONLY; Surgeon: Marian Sorrow, MD; Location: [facility]; Service: Obstetrics   CESAREAN SECTION N/A 10/02/2017   Procedure: CESAREAN SECTION;  Surgeon: Emelda Fear,  Cyndi Lennert, MD;  Location: [facility];  Service: Obstetrics;  Laterality: N/A;   I & D EXTREMITY Left 10/14/2020   Procedure: Irrigation and debridement left small and ring finger with repair of tendon;  Surgeon: Dominica Severin, MD;  Location: [facility] OR;  Service: Orthopedics;  Laterality: Left;  1 hr Block with IV Sed   LAPAROSCOPIC OOPHERECTOMY Left    LEEP     OVARIAN CYST REMOVAL     TONSILLECTOMY     WISDOM TOOTH EXTRACTION      Family History  Problem Relation Age of Onset   Diabetes Mother    Migraines Mother    Diabetes Father    Hypertension Father    Hypertension Maternal Grandmother    Diabetes Maternal Grandmother    Diabetes Maternal Grandfather     COPD Maternal Grandfather    Diabetes Paternal Grandmother    Hypertension Paternal Grandmother    Diabetes Paternal Grandfather    Hypertension Paternal Grandfather    Heart attack Paternal Grandfather    Asthma Sister     Allergies  Allergen Reactions   Ancef [Cefazolin] Hives and Swelling    Facial and neck swelling; Had reaction after receiving fentanyl and ancef in the OR   Fentanyl Hives and Swelling    Facial and neck swelling; Had reaction after receiving fentanyl and ancef in the OR   Shrimp [Shellfish Allergy] Swelling and Other (See Comments)    Swelling of throat   Morphine And Related Rash   Pseudoephedrine Hcl Other (See Comments)    Light headed/ dizzy    Current Outpatient Medications on File Prior to Visit  Medication Sig Dispense Refill   acetaminophen (TYLENOL) 500 MG tablet Take 1,000 mg by mouth every 6 (six) hours as needed for mild pain or moderate pain.     Dulaglutide (TRULICITY) 3 MG/0.5ML SOPN Inject 3 mg as directed once a week. For diabetes. 6 mL 0   metFORMIN (GLUCOPHAGE-XR) 500 MG 24 hr tablet Take 2 tablets (1,000 mg total) by mouth 2 (two) times daily with a meal. For diabetes. (Patient taking differently: Take 500-1,000 mg by mouth See admin instructions. Take 1000 mg in the morning and 500 mg at bedtime) 360 tablet 3   triamcinolone ointment (KENALOG) 0.5 % Apply 1 application topically 2 (two) times daily. (Patient taking differently: Apply 1 application topically daily as needed (Dry skin).) 30 g 0   trimethoprim-polymyxin b (POLYTRIM) ophthalmic solution Place 1 drop into both eyes every 4 (four) hours. 10 mL 0   No current facility-administered medications on file prior to visit.    BP 126/82   Pulse 88   Temp 97.6 F (36.4 C) (Temporal)   Ht 5\' 9"  (1.753 m)   Wt 249 lb (112.9 kg)   SpO2 100%   BMI 36.77 kg/m  Objective:   Physical Exam Cardiovascular:     Rate and Rhythm: Normal rate and regular rhythm.  Pulmonary:     Effort:  Pulmonary effort is normal.     Breath sounds: Normal breath sounds.  Musculoskeletal:     Cervical back: Neck supple.  Skin:    General: Skin is warm and dry.          Assessment & Plan:      This visit occurred during the SARS-CoV-2 public health emergency.  Safety protocols were in place, including screening questions prior to the visit, additional usage of staff PPE, and extensive cleaning of exam room while observing appropriate contact time  as indicated for disinfecting solutions.

## 2021-06-07 NOTE — Patient Instructions (Signed)
Start metformin XR 500 mg once daily with food for diabetes.  Start Glipizide XL 5 mg once daily for diabetes.   Continue checking your blood sugar levels.  Appropriate times to check your blood sugar levels are:  -Before any meal (breakfast, lunch, dinner) -Two hours after any meal (breakfast, lunch, dinner) -Bedtime  We will see you in 3 months.   It was a pleasure to see you today!  Diabetes Mellitus and Nutrition, Adult When you have diabetes, or diabetes mellitus, it is very important to have healthy eating habits because your blood sugar (glucose) levels are greatly affected by what you eat and drink. Eating healthy foods in the right amounts, at about the same times every day, can help you: Control your blood glucose. Lower your risk of heart disease. Improve your blood pressure. Reach or maintain a healthy weight. What can affect my meal plan? Every person with diabetes is different, and each person has different needs for a meal plan. Your health care provider may recommend that you work with a dietitian to make a meal plan that is best for you. Your meal plan may vary depending on factors such as: The calories you need. The medicines you take. Your weight. Your blood glucose, blood pressure, and cholesterol levels. Your activity level. Other health conditions you have, such as heart or kidney disease. How do carbohydrates affect me? Carbohydrates, also called carbs, affect your blood glucose level more than any other type of food. Eating carbs naturally raises the amount of glucose in your blood. Carb counting is a method for keeping track of how many carbs you eat. Counting carbs is important to keep your blood glucose at a healthy level, especially if you use insulin or take certain oral diabetes medicines. It is important to know how many carbs you can safely have in each meal. This is different for every person. Your dietitian can help you calculate how many carbs you  should have at each meal and for each snack. How does alcohol affect me? Alcohol can cause a sudden decrease in blood glucose (hypoglycemia), especially if you use insulin or take certain oral diabetes medicines. Hypoglycemia can be a life-threatening condition. Symptoms of hypoglycemia, such as sleepiness, dizziness, and confusion, are similar to symptoms of having too much alcohol. Do not drink alcohol if: Your health care provider tells you not to drink. You are pregnant, may be pregnant, or are planning to become pregnant. If you drink alcohol: Do not drink on an empty stomach. Limit how much you use to: 0-1 drink a day for women. 0-2 drinks a day for men. Be aware of how much alcohol is in your drink. In the [place] one drink equals one 12 oz bottle of beer (355 mL), one 5 oz glass of wine (148 mL), or one 1 oz glass of hard liquor (44 mL). Keep yourself hydrated with water, diet soda, or unsweetened iced tea. Keep in mind that regular soda, juice, and other mixers may contain a lot of sugar and must be counted as carbs. What are tips for following this plan? Reading food labels Start by checking the serving size on the "Nutrition Facts" label of packaged foods and drinks. The amount of calories, carbs, fats, and other nutrients listed on the label is based on one serving of the item. Many items contain more than one serving per package. Check the total grams (g) of carbs in one serving. You can calculate the number of servings of carbs in  one serving by dividing the total carbs by 15. For example, if a food has 30 g of total carbs per serving, it would be equal to 2 servings of carbs. Check the number of grams (g) of saturated fats and trans fats in one serving. Choose foods that have a low amount or none of these fats. Check the number of milligrams (mg) of salt (sodium) in one serving. Most people should limit total sodium intake to less than 2,300 mg per day. Always check the nutrition  information of foods labeled as "low-fat" or "nonfat." These foods may be higher in added sugar or refined carbs and should be avoided. Talk to your dietitian to identify your daily goals for nutrients listed on the label. Shopping Avoid buying canned, pre-made, or processed foods. These foods tend to be high in fat, sodium, and added sugar. Shop around the outside edge of the grocery store. This is where you will most often find fresh fruits and vegetables, bulk grains, fresh meats, and fresh dairy. Cooking Use low-heat cooking methods, such as baking, instead of high-heat cooking methods like deep frying. Cook using healthy oils, such as olive, canola, or sunflower oil. Avoid cooking with butter, cream, or high-fat meats. Meal planning Eat meals and snacks regularly, preferably at the same times every day. Avoid going long periods of time without eating. Eat foods that are high in fiber, such as fresh fruits, vegetables, beans, and whole grains. Talk with your dietitian about how many servings of carbs you can eat at each meal. Eat 4-6 oz (112-168 g) of lean protein each day, such as lean meat, chicken, fish, eggs, or tofu. One ounce (oz) of lean protein is equal to: 1 oz (28 g) of meat, chicken, or fish. 1 egg.  cup (62 g) of tofu. Eat some foods each day that contain healthy fats, such as avocado, nuts, seeds, and fish. What foods should I eat? Fruits Berries. Apples. Oranges. Peaches. Apricots. Plums. Grapes. Mango. Papaya. Pomegranate. Kiwi. Cherries. Vegetables Lettuce. Spinach. Leafy greens, including kale, chard, collard greens, and mustard greens. Beets. Cauliflower. Cabbage. Broccoli. Carrots. Green beans. Tomatoes. Peppers. Onions. Cucumbers. Brussels sprouts. Grains Whole grains, such as whole-wheat or whole-grain bread, crackers, tortillas, cereal, and pasta. Unsweetened oatmeal. Quinoa. Brown or wild rice. Meats and other proteins Seafood. Poultry without skin. Lean cuts of  poultry and beef. Tofu. Nuts. Seeds. Dairy Low-fat or fat-free dairy products such as milk, yogurt, and cheese. The items listed above may not be a complete list of foods and beverages you can eat. Contact a dietitian for more information. What foods should I avoid? Fruits Fruits canned with syrup. Vegetables Canned vegetables. Frozen vegetables with butter or cream sauce. Grains Refined white flour and flour products such as bread, pasta, snack foods, and cereals. Avoid all processed foods. Meats and other proteins Fatty cuts of meat. Poultry with skin. Breaded or fried meats. Processed meat. Avoid saturated fats. Dairy Full-fat yogurt, cheese, or milk. Beverages Sweetened drinks, such as soda or iced tea. The items listed above may not be a complete list of foods and beverages you should avoid. Contact a dietitian for more information. Questions to ask a health care provider Do I need to meet with a diabetes educator? Do I need to meet with a dietitian? What number can I call if I have questions? When are the best times to check my blood glucose? Where to find more information: American Diabetes Association: diabetes.org Academy of Nutrition and Dietetics: www.eatright.[employer]  of Diabetes and Digestive and Kidney Diseases: CarFli[employer].tn Association of Diabetes Care and Education Specialists: www.diabeteseducator.org Summary It is important to have healthy eating habits because your blood sugar (glucose) levels are greatly affected by what you eat and drink. A healthy meal plan will help you control your blood glucose and maintain a healthy lifestyle. Your health care provider may recommend that you work with a dietitian to make a meal plan that is best for you. Keep in mind that carbohydrates (carbs) and alcohol have immediate effects on your blood glucose levels. It is important to count carbs and to use alcohol carefully. This information is not intended to  replace advice given to you by your health care provider. Make sure you discuss any questions you have with your health care provider. Document Revised: 07/14/2019 Document Reviewed: 07/14/2019 Elsevier Patient Education  2021 [employer].

## 2021-06-07 NOTE — Assessment & Plan Note (Signed)
Progressing with symptoms, A1C increase from May 2022.  Discussed importance of medication compliance. Reduce metformin XR 500 mg to 1 tablet daily due to GI side effects. Add Glipizide XL 5 mg.  Discussed when and how to check glucose readings. Handout provided.  Discussed the importance of a healthy diet and regular exercise in order for weight loss, and to reduce the risk of further co-morbidity.  Foot exam today. Eye exam is UTD. Urine microalbumin due and pending.  Follow up in 3 months.

## 2021-09-06 ENCOUNTER — Encounter: Payer: 59 | Admitting: Primary Care

## 2021-09-21 ENCOUNTER — Telehealth: Payer: Self-pay | Admitting: Primary Care

## 2021-09-27 LAB — HM DIABETES EYE EXAM

## 2021-09-28 ENCOUNTER — Encounter: Payer: 59 | Admitting: Primary Care

## 2021-10-05 ENCOUNTER — Encounter: Payer: Self-pay | Admitting: Primary Care

## 2021-10-10 ENCOUNTER — Encounter: Payer: Self-pay | Admitting: Internal Medicine

## 2021-10-10 ENCOUNTER — Ambulatory Visit (INDEPENDENT_AMBULATORY_CARE_PROVIDER_SITE_OTHER): Payer: 59 | Admitting: Internal Medicine

## 2021-10-10 ENCOUNTER — Other Ambulatory Visit: Payer: Self-pay

## 2021-10-10 DIAGNOSIS — J069 Acute upper respiratory infection, unspecified: Secondary | ICD-10-CM | POA: Diagnosis not present

## 2021-10-10 LAB — POC COVID19 BINAXNOW: SARS Coronavirus 2 Ag: NEGATIVE

## 2021-10-10 NOTE — Addendum Note (Signed)
Addended by: Eual Fines on: 10/10/2021 10:20 AM   Modules accepted: Orders

## 2021-10-10 NOTE — Assessment & Plan Note (Signed)
Seems like typical viral infection COVID negative here Some sinus symptoms--but discussed still likely viral Supportive care---mostly analgesics If worsens at the end of the week or next---will give amoxil/augmentin

## 2021-10-10 NOTE — Progress Notes (Signed)
Subjective:    Patient ID: Julia Kline, female    DOB: 10-Aug-1986, 36 y.o.   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     glipiZIDE (GLIPIZIDE XL) 5 MG 24 hr tablet Take 1 tablet (5 mg total) by mouth daily with breakfast. For diabetes 90 tablet 1   metFORMIN (GLUCOPHAGE-XR) 500 MG 24 hr tablet Take 1 tablet (500 mg total) by mouth daily with breakfast. For diabetes. 90 tablet 1   triamcinolone ointment (KENALOG) 0.5 % Apply 1 application topically 2 (two) times daily. (Patient taking differently: Apply 1 application topically daily as needed (Dry skin).) 30 g 0   trimethoprim-polymyxin b (POLYTRIM) ophthalmic solution Place 1 drop into both eyes every 4 (four) hours. 10 mL 0   No current facility-administered medications on file prior to visit.    Allergies  Allergen Reactions   Ancef [Cefazolin] Hives and Swelling    Facial and neck swelling; Had reaction after receiving fentanyl and ancef in the OR   Fentanyl Hives and Swelling    Facial and neck swelling; Had reaction after receiving fentanyl and ancef in the OR   Shrimp [Shellfish Allergy] Swelling and Other (See Comments)    Swelling of throat   Morphine And Related Rash   Pseudoephedrine Hcl Other (See Comments)    Light headed/ dizzy    Past Medical History:  Diagnosis Date   Acute conjunctivitis of right eye 07/10/2019    Acute pain of right knee 08/06/2019   COVID-19    Diabetes mellitus without complication (HCC)    Eczema    Hernia cerebri (HCC)    History of gestational diabetes 05/02/2017   Normal pp testing 10/2017   History of severe pre-eclampsia 05/02/2017   Hypertension    on no medications   Mastitis 06/18/2019   Migraine    PCOS (polycystic ovarian syndrome)    Pneumonia    Pregnancy induced hypertension     Past Surgical History:  Procedure Laterality Date   CESAREAN SECTION  2017   Procedure: CESAREAN DELIVERY ONLY; Surgeon: Marian Sorrow, MD; Location: C[facility]SECTION SU[facility]; Service: Obstetrics   CESAREAN SECTION N/A 10/02/2017   Procedure: CESAREAN SECTION;  Surgeon: Tilda Burrow, MD;  Location: [facility] BIRTHING SUITES;  Service: Obstetrics;  Laterality: N/A;   I & D EXTREMITY Left 10/14/2020   Procedure: Irrigation and debridement left small and ring finger with repair of tendon;  Surgeon: Dominica Severin, MD;  Location: [facility] OR;  Service: Orthopedics;  Laterality: Left;  1 hr Block with IV Sed   LAPAROSCOPIC OOPHERECTOMY Left    LEEP     OVARIAN CYST REMOVAL     TONSILLECTOMY     WISDOM TOOTH EXTRACTION      Family History  Problem Relation Age of Onset   Diabetes Mother    Migraines Mother    Diabetes Father    Hypertension Father    Hypertension Maternal  Grandmother    Diabetes Maternal Grandmother    Diabetes Maternal Grandfather    COPD Maternal Grandfather    Diabetes Paternal Grandmother    Hypertension Paternal Grandmother    Diabetes Paternal Grandfather    Hypertension Paternal Grandfather    Heart attack Paternal Grandfather    Asthma Sister     Social History   Socioeconomic History   Marital status: Married    Spouse name: Not on file   Number of children: Not on file   Years of education: Not on file   Highest education level: Not on file  Occupational History   Not on file  Tobacco Use   Smoking status: Never   Smokeless tobacco: Never   Vaping Use   Vaping Use: Never used  Substance and Sexual Activity   Alcohol use: No   Drug use: No   Sexual activity: Yes    Birth control/protection: Injection  Other Topics Concern   Not on file  Social History Narrative   Married.   2 children.   Works at [employer].   Social Determinants of Health   Financial Resource Strain: Not on file  Food Insecurity: Not on file  Transportation Needs: Not on file  Physical Activity: Not on file  Stress: Not on file  Social Connections: Not on file  Intimate Partner Violence: Not on file   Review of Systems No change in taste or smell Appetite is off Had 2022 flu vaccine COVID vaccines--2 and a booster     Objective:   Physical Exam Constitutional:      Appearance: Normal appearance.  HENT:     Head:     Comments: Mild maxillary tenderness    Right Ear: Tympanic membrane and ear canal normal.     Left Ear: Tympanic membrane and ear canal normal.     Nose:     Comments: Mild congestion    Mouth/Throat:     Pharynx: No oropharyngeal exudate or posterior oropharyngeal erythema.  Neck:     Comments: Mildly tender bilateral anterior cervical nodes Pulmonary:     Effort: Pulmonary effort is normal.     Breath sounds: Normal breath sounds. No wheezing or rales.  Musculoskeletal:     Cervical back: Neck supple.  Neurological:     Mental Status: She is alert.           Assessment & Plan:

## 2021-10-16 ENCOUNTER — Telehealth: Payer: Self-pay | Admitting: Primary Care

## 2021-10-16 NOTE — Telephone Encounter (Signed)
Pt stated she need refill on med: glipiZIDE (GLIPIZIDE XL) 5 MG 24 hr tablet   She is almost out

## 2021-10-17 NOTE — Telephone Encounter (Signed)
She has not requested from pharmacy but will do so now. She will let us know if any questions.

## 2021-10-17 NOTE — Telephone Encounter (Signed)
According to my prescription she should have enough through April. Did she request a refill through her pharmacy?

## 2021-10-20 ENCOUNTER — Encounter: Payer: 59 | Admitting: Primary Care

## 2021-11-17 ENCOUNTER — Telehealth (INDEPENDENT_AMBULATORY_CARE_PROVIDER_SITE_OTHER): Payer: 59 | Admitting: Nurse Practitioner

## 2021-11-17 ENCOUNTER — Encounter: Payer: Self-pay | Admitting: Nurse Practitioner

## 2021-11-17 VITALS — Temp 95.6°F

## 2021-11-17 DIAGNOSIS — H1031 Unspecified acute conjunctivitis, right eye: Secondary | ICD-10-CM

## 2021-11-17 MED ORDER — ERYTHROMYCIN 5 MG/GM OP OINT: 1.0000 "application " | TOPICAL_OINTMENT | Freq: Four times a day (QID) | OPHTHALMIC | 0 refills | Status: DC

## 2021-11-17 NOTE — Progress Notes (Signed)
? ? Patient ID: Julia Kline, female    DOB: [birth year], 36 y.o.   MRN: [id number] ? ?Virtual visit completed through [employer], a video enabled telemedicine application. Due to national recommendations of social distancing due to COVID-19, a virtual visit is felt to be most appropriate for this patient at this time. Reviewed limitations, risks, security and privacy concerns of performing a virtual visit and the availability of in person appointments. I also reviewed that there may be a patient responsible charge related to this service. The patient agreed to proceed.  ? ?Patient location: home ?Provider location: Adult nurse at [facility], office ?Persons participating in this virtual visit: patient, provider  ? ?If any vitals were documented, they were collected by patient at home unless specified below.   ? ?Temp (!) 95.6 ?F (35.3 ?C) Comment: per patient this morning  ? ?CC: eye problem ?Subjective:  ? ?HPI: ?Julia Kline is a 36 y.o. female presenting on 11/17/2021 for Eye Problem (Right eye-Pain, red, drainage green color. Started this morning. NO fever.) ? ?Right eye pain that started this morning. ?States that she had a lot of drainage and pain this morning ?States that she used otc allgery drop, that did not help ?Green color discharge. ? 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 ? ?Relevant past medical, surgical, family and social history reviewed and updated as indicated. Interim medical history since our last visit reviewed. ?Allergies and medications reviewed and updated. ?Outpatient Medications Prior to Visit  ?Medication Sig Dispense Refill  ? acetaminophen (TYLENOL) 500 MG tablet Take 1,000 mg by mouth every 6 (six) hours as needed for mild pain or moderate pain.    ? glipiZIDE (GLIPIZIDE XL) 5 MG 24 hr tablet Take 1 tablet (5 mg total) by mouth daily with breakfast. For diabetes 90 tablet 1  ? metFORMIN (GLUCOPHAGE-XR) 500 MG 24 hr tablet Take 1 tablet (500 mg  total) by mouth daily with breakfast. For diabetes. 90 tablet 1  ? triamcinolone ointment (KENALOG) 0.5 % Apply 1 application topically 2 (two) times daily. (Patient taking differently: Apply 1 application. topically daily as needed (Dry skin).) 30 g 0  ? trimethoprim-polymyxin b (POLYTRIM) ophthalmic solution Place 1 drop into both eyes every 4 (four) hours. 10 mL 0  ? ?No facility-administered medications prior to visit.  ?  ? ?Per HPI unless specifically indicated in ROS section below ?Review of Systems  ?Constitutional:  Negative for chills and fever.  ?Eyes:  Positive for photophobia, pain, discharge and redness. Negative for itching and visual disturbance.  ?Objective:  ?Temp (!) 95.6 ?F (35.3 ?C) Comment: per patient this morning  ?Wt Readings from Last 3 Encounters:  ?10/10/21 249 lb (112.9 kg)  ?06/07/21 249 lb (112.9 kg)  ?03/10/21 249 lb (112.9 kg)  ?  ?  ? ?Physical exam: ?Gen: alert, NAD, not ill appearing ?Pulm: speaks in complete sentences without increased work of breathing ?Psych: normal mood, normal thought content  ? ?   ?Results for orders placed or performed in visit on 10/10/21  ?POC COVID-19  ?Result Value Ref Range  ? SARS Coronavirus 2 Ag Negative Negative  ? ?Assessment & Plan:  ? ?Problem List Items Addressed This Visit   ? ?  ? Other  ? Acute bacterial conjunctivitis of right eye - Primary  ?  Patient has had a recent upper respiratory infection in the past month.  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  Signs and symptoms indicative of  a bacterial conjunctivitis.  Patient's had in the past.  Will write erythromycin 0.5% ophthalmic ointment 4 times daily for 7 days.  Did inform patient that doing virtual visit limits etiology evaluate eye complaints.  Did review signs symptoms to be reevaluated or seen emergently ?  ?  ? Relevant Medications  ? erythromycin ophthalmic ointment  ?  ? ?Meds ordered this encounter  ?Medications  ? erythromycin ophthalmic ointment  ?  Sig: Place 1  application. into the right eye 4 (four) times daily. For 1 week  ?  Dispense:  3.5 g  ?  Refill:  0  ?  Order Specific Question:   Supervising Provider  ?  Answer:   Roxy Manns A [[id number]]  ? ?No orders of the defined types were placed in this encounter. ? ? ?I discussed the assessment and treatment plan with the patient. The patient was provided an opportunity to ask questions and all were answered. The patient agreed with the plan and demonstrated an understanding of the instructions. The patient was advised to call back or seek an in-person evaluation if the symptoms worsen or if the condition fails to improve as anticipated. ? ?Follow up plan: ?No follow-ups on file. ? ?Audria Nine, NP   ?

## 2021-11-17 NOTE — Assessment & Plan Note (Addendum)
Patient has had a recent upper respiratory infection in the past month.  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  Signs and symptoms indicative of a bacterial conjunctivitis.  Patient's had in the past.  Will write erythromycin 0.5% ophthalmic ointment 4 times daily for 7 days.  Did inform patient that doing virtual visit limits etiology evaluate eye complaints.  Did review signs symptoms to be reevaluated or seen emergently ?

## 2021-11-30 ENCOUNTER — Ambulatory Visit (INDEPENDENT_AMBULATORY_CARE_PROVIDER_SITE_OTHER): Payer: 59 | Admitting: Primary Care

## 2021-11-30 ENCOUNTER — Encounter: Payer: Self-pay | Admitting: Primary Care

## 2021-11-30 ENCOUNTER — Other Ambulatory Visit (HOSPITAL_COMMUNITY)
Admission: RE | Admit: 2021-11-30 | Discharge: 2021-11-30 | Disposition: A | Discharge status: Home / Self Care | Payer: 59 | Source: Ambulatory Visit | Attending: Primary Care | Admitting: Primary Care

## 2021-11-30 VITALS — BP 124/78 | HR 84 | Temp 98.6°F | Ht 70.0 in | Wt 258.0 lb

## 2021-11-30 DIAGNOSIS — Z124 Encounter for screening for malignant neoplasm of cervix: Secondary | ICD-10-CM

## 2021-11-30 DIAGNOSIS — E1165 Type 2 diabetes mellitus with hyperglycemia: Secondary | ICD-10-CM

## 2021-11-30 DIAGNOSIS — D649 Anemia, unspecified: Secondary | ICD-10-CM | POA: Diagnosis not present

## 2021-11-30 DIAGNOSIS — G43709 Chronic migraine without aura, not intractable, without status migrainosus: Secondary | ICD-10-CM

## 2021-11-30 DIAGNOSIS — Z0000 Encounter for general adult medical examination without abnormal findings: Secondary | ICD-10-CM

## 2021-11-30 DIAGNOSIS — Z1159 Encounter for screening for other viral diseases: Secondary | ICD-10-CM

## 2021-11-30 DIAGNOSIS — Z0001 Encounter for general adult medical examination with abnormal findings: Secondary | ICD-10-CM

## 2021-11-30 LAB — LIPID PANEL
Cholesterol: 137 mg/dL (ref 0–200)
HDL: 45 mg/dL (ref >39.00)
LDL Cholesterol: 72 mg/dL (ref 0–99)
NonHDL: 92.31
Total CHOL/HDL Ratio: 3
Triglycerides: 100 mg/dL (ref 0.0–149.0)
VLDL: 20 mg/dL (ref 0.0–40.0)

## 2021-11-30 LAB — CBC
HCT: 30.9 % — ABNORMAL LOW (ref 36.0–46.0)
Hemoglobin: 9.6 g/dL — ABNORMAL LOW (ref 12.0–15.0)
MCHC: 31 g/dL (ref 30.0–36.0)
MCV: 67.1 fl — ABNORMAL LOW (ref 78.0–100.0)
Platelets: 242 10*3/uL (ref 150.0–400.0)
RBC: 4.61 Mil/uL (ref 3.87–5.11)
RDW: 16.3 % — ABNORMAL HIGH (ref 11.5–15.5)
WBC: 5.8 10*3/uL (ref 4.0–10.5)

## 2021-11-30 LAB — COMPREHENSIVE METABOLIC PANEL
ALT: 64 U/L — ABNORMAL HIGH (ref 0–35)
AST: 53 U/L — ABNORMAL HIGH (ref 0–37)
Albumin: 4.1 g/dL (ref 3.5–5.2)
Alkaline Phosphatase: 62 U/L (ref 39–117)
BUN: 8 mg/dL (ref 6–23)
CO2: 29 mEq/L (ref 19–32)
Calcium: 8.9 mg/dL (ref 8.4–10.5)
Chloride: 100 mEq/L (ref 96–112)
Creatinine, Ser: 0.62 mg/dL (ref 0.40–1.20)
GFR: 114.8 mL/min (ref >60.00)
Glucose, Bld: 176 mg/dL — ABNORMAL HIGH (ref 70–99)
Potassium: 3.8 mEq/L (ref 3.5–5.1)
Sodium: 136 mEq/L (ref 135–145)
Total Bilirubin: 0.3 mg/dL (ref 0.2–1.2)
Total Protein: 7.5 g/dL (ref 6.0–8.3)

## 2021-11-30 LAB — IBC + FERRITIN
Ferritin: 12.2 ng/mL (ref 10.0–291.0)
Iron: 52 ug/dL (ref 42–145)
Saturation Ratios: 9.6 % — ABNORMAL LOW (ref 20.0–50.0)
TIBC: 540.4 ug/dL — ABNORMAL HIGH (ref 250.0–450.0)
Transferrin: 386 mg/dL — ABNORMAL HIGH (ref 212.0–360.0)

## 2021-11-30 LAB — POCT GLYCOSYLATED HEMOGLOBIN (HGB A1C): Hemoglobin A1C: 8.4 % — AB (ref 4.0–5.6)

## 2021-11-30 MED ORDER — OZEMPIC (0.25 OR 0.5 MG/DOSE) 2 MG/1.5ML ~~LOC~~ SOPN: PEN_INJECTOR | SUBCUTANEOUS | 1 refills | Status: DC

## 2021-11-30 NOTE — Progress Notes (Signed)
? ?Subjective:  ? ? Patient ID: Julia Kline, female    DOB: May 17, 1986, 36 y.o.   MRN: [id number] ? ?HPI ? ?Julia Kline is a very pleasant 36 y.o. female who presents today for complete physical and follow up of chronic conditions. ? ?She is no longer taking metformin XR due to GI upset and diarrhea. She takes her metformin and glipizide 0-1 times weekly on average. She forgets to take her medications which is the main reason why she is noncompliant.  She denies polyuria and polydipsia.  She was once managed on Trulicity and did well on this regimen, however her insurance company stopped paying for coverage. ? ?She has began craving ice in January 2023. She has a history of anemia that dates back to college.  ? ?Immunizations: ?-Tetanus: Completed in 2018 ?-Influenza: Completed last season ?-Covid-19: Completed 3 vaccines ? ?Diet: Fair diet.  ?Exercise: No regular exercise.  ? ?Eye exam: Completes annually  ?Dental exam: Completes semi-annually  ? ?Pap Smear: Completed in 2018 ? ? ?BP Readings from Last 3 Encounters:  ?11/30/21 124/78  ?10/10/21 110/70  ?06/07/21 126/82  ? ? ? ? ?Review of Systems  ?Constitutional:  Negative for unexpected weight change.  ?HENT:  Negative for rhinorrhea.   ?Eyes:  Negative for visual disturbance.  ?Respiratory:  Negative for cough and shortness of breath.   ?Cardiovascular:  Negative for chest pain.  ?Gastrointestinal:  Negative for constipation and diarrhea.  ?Endocrine: Negative for polydipsia and polyuria.  ?Genitourinary:  Negative for difficulty urinating and menstrual problem.  ?Musculoskeletal:  Negative for arthralgias and myalgias.  ?Skin:  Negative for rash.  ?Allergic/Immunologic: Negative for environmental allergies.  ?Neurological:  Negative for dizziness, numbness and headaches.  ?Psychiatric/Behavioral:  The patient is not nervous/anxious.   ? ?   ? ? ?Past Medical History:  ?Diagnosis Date  ? Acute conjunctivitis of right eye 07/10/2019  ? Acute pain  of right knee 08/06/2019  ? COVID-19   ? Diabetes mellitus without complication (HCC)   ? Eczema   ? Hernia cerebri (HCC)   ? History of gestational diabetes 05/02/2017  ? Normal pp testing 10/2017  ? History of severe pre-eclampsia 05/02/2017  ? Hypertension   ? on no medications  ? Mastitis 06/18/2019  ? Migraine   ? PCOS (polycystic ovarian syndrome)   ? Pneumonia   ? Pregnancy induced hypertension   ? ? ?Social History  ? ?Socioeconomic History  ? Marital status: Married  ?  Spouse name: Not on file  ? Number of children: Not on file  ? Years of education: Not on file  ? Highest education level: Not on file  ?Occupational History  ? Not on file  ?Tobacco Use  ? Smoking status: Never  ? Smokeless tobacco: Never  ?Vaping Use  ? Vaping Use: Never used  ?Substance and Sexual Activity  ? Alcohol use: No  ? Drug use: No  ? Sexual activity: Yes  ?  Birth control/protection: Injection  ?Other Topics Concern  ? Not on file  ?Social History Narrative  ? Married.  ? 2 children.  ? Works at [employer].  ? ?Social Determinants of Health  ? ?Financial Resource Strain: Not on file  ?Food Insecurity: Not on file  ?Transportation Needs: Not on file  ?Physical Activity: Not on file  ?Stress: Not on file  ?Social Connections: Not on file  ?Intimate Partner Violence: Not on file  ? ? ?Past Surgical History:  ?Procedure Laterality Date  ?  CESAREAN SECTION  2017  ? Procedure: CESAREAN DELIVERY ONLY; Surgeon: Marian SorrowKalpen Navin Patel, MD; [facility]; Service: Obstetrics  ? CESAREAN SECTION N/A 10/02/2017  ? Procedure: CESAREAN SECTION;  Surgeon: Tilda BurrowFerguson, John V, MD;  [facility];  Service: Obstetrics;  Laterality: N/A;  ? I & D EXTREMITY Left 10/14/2020  ? Procedure: Irrigation and debridement left small and ring finger with repair of tendon;  Surgeon: Dominica SeverinGramig, William, MD;  Location: [facility] OR;  Service: Orthopedics;  Laterality: Left;  1 hr ?Block with IV Sed  ? LAPAROSCOPIC OOPHERECTOMY Left   ? LEEP     ? OVARIAN CYST REMOVAL    ? TONSILLECTOMY    ? WISDOM TOOTH EXTRACTION    ? ? ?Family History  ?Problem Relation Age of Onset  ? Diabetes Mother   ? Migraines Mother   ? Diabetes Father   ? Hypertension Father   ? Hypertension Maternal Grandmother   ? Diabetes Maternal Grandmother   ? Diabetes Maternal Grandfather   ? COPD Maternal Grandfather   ? Diabetes Paternal Grandmother   ? Hypertension Paternal Grandmother   ? Diabetes Paternal Grandfather   ? Hypertension Paternal Grandfather   ? Heart attack Paternal Grandfather   ? Asthma Sister   ? ? ?Allergies  ?Allergen Reactions  ? Ancef [Cefazolin] Hives and Swelling  ?  Facial and neck swelling; Had reaction after receiving fentanyl and ancef in the OR  ? Fentanyl Hives and Swelling  ?  Facial and neck swelling; Had reaction after receiving fentanyl and ancef in the OR  ? Shrimp [Shellfish Allergy] Swelling and Other (See Comments)  ?  Swelling of throat  ? Morphine And Related Rash  ? Pseudoephedrine Hcl Other (See Comments)  ?  Light headed/ dizzy  ? ? ?Current Outpatient Medications on File Prior to Visit  ?Medication Sig Dispense Refill  ? acetaminophen (TYLENOL) 500 MG tablet Take 1,000 mg by mouth every 6 (six) hours as needed for mild pain or moderate pain.    ? glipiZIDE (GLIPIZIDE XL) 5 MG 24 hr tablet Take 1 tablet (5 mg total) by mouth daily with breakfast. For diabetes (Patient not taking: Reported on 11/30/2021) 90 tablet 1  ? metFORMIN (GLUCOPHAGE-XR) 500 MG 24 hr tablet Take 1 tablet (500 mg total) by mouth daily with breakfast. For diabetes. (Patient not taking: Reported on 11/30/2021) 90 tablet 1  ? ?No current facility-administered medications on file prior to visit.  ? ? ?BP 124/78   Pulse 84   Temp 98.6 ?F (37 ?C) (Oral)   Ht 5\' 10"  (1.778 m)   Wt 258 lb (117 kg)   LMP 11/18/2021   SpO2 100%   BMI 37.02 kg/m?  ?Objective:  ? Physical Exam ?HENT:  ?   Right Ear: Tympanic membrane and ear canal normal.  ?   Left Ear: Tympanic membrane and ear  canal normal.  ?   Nose: Nose normal.  ?Eyes:  ?   Conjunctiva/sclera: Conjunctivae normal.  ?   Pupils: Pupils are equal, round, and reactive to light.  ?Neck:  ?   Thyroid: No thyromegaly.  ?Cardiovascular:  ?   Rate and Rhythm: Normal rate and regular rhythm.  ?   Heart sounds: No murmur heard. ?Pulmonary:  ?   Effort: Pulmonary effort is normal.  ?   Breath sounds: Normal breath sounds. No rales.  ?Abdominal:  ?   General: Bowel sounds are normal.  ?   Palpations: Abdomen is soft.  ?  Tenderness: There is no abdominal tenderness.  ?Genitourinary: ?   Labia:     ?   Right: No tenderness or lesion.     ?   Left: No tenderness or lesion.   ?   Vagina: No vaginal discharge or erythema.  ?   Cervix: No cervical motion tenderness.  ?   Uterus: Normal.   ?Musculoskeletal:     ?   General: Normal range of motion.  ?   Cervical back: Neck supple.  ?Lymphadenopathy:  ?   Cervical: No cervical adenopathy.  ?Skin: ?   General: Skin is warm and dry.  ?   Findings: No rash.  ?Neurological:  ?   Mental Status: She is alert and oriented to person, place, and time.  ?   Cranial Nerves: No cranial nerve deficit.  ?   Deep Tendon Reflexes: Reflexes are normal and symmetric.  ?Psychiatric:     ?   Mood and Affect: Mood normal.  ? ? ? ? ? ?   ?Assessment & Plan:  ? ? ? ? ?This visit occurred during the SARS-CoV-2 public health emergency.  Safety protocols were in place, including screening questions prior to the visit, additional usage of staff PPE, and extensive cleaning of exam room while observing appropriate contact time as indicated for disinfecting solutions.  ?

## 2021-11-30 NOTE — Assessment & Plan Note (Signed)
Controlled.  No concerns today. Continue to monitor.  

## 2021-11-30 NOTE — Patient Instructions (Signed)
Start Ozempic medication for diabetes treatment. ? ?Start by injecting 0.25 mg into the skin once weekly for 4 weeks, then increase to 0.5 mg into the skin once weekly thereafter. ? ?It is important that you improve your diet. Please limit carbohydrates in the form of white bread, rice, pasta, sweets, fast food, fried food, sugary drinks, etc. Increase your consumption of fresh fruits and vegetables, whole grains, lean protein. ? ?Ensure you are consuming 64 ounces of water daily. ? ?Stop by the lab prior to leaving today. I will notify you of your results once received.  ? ?Please schedule a follow up visit for 3 months for diabetes check. ? ?It was a pleasure to see you today! ? ?Preventive Care 36-96 Years Old, Female ?Preventive care refers to lifestyle choices and visits with your health care provider that can promote health and wellness. Preventive care visits are also called wellness exams. ?What can I expect for my preventive care visit? ?Counseling ?During your preventive care visit, your health care provider may ask about your: ?Medical history, including: ?Past medical problems. ?Family medical history. ?Pregnancy history. ?Current health, including: ?Menstrual cycle. ?Method of birth control. ?Emotional well-being. ?Home life and relationship well-being. ?Sexual activity and sexual health. ?Lifestyle, including: ?Alcohol, nicotine or tobacco, and drug use. ?Access to firearms. ?Diet, exercise, and sleep habits. ?Work and work Statistician. ?Sunscreen use. ?Safety issues such as seatbelt and bike helmet use. ?Physical exam ?Your health care provider may check your: ?Height and weight. These may be used to calculate your BMI (body mass index). BMI is a measurement that tells if you are at a healthy weight. ?Waist circumference. This measures the distance around your waistline. This measurement also tells if you are at a healthy weight and may help predict your risk of certain diseases, such as type 2  diabetes and high blood pressure. ?Heart rate and blood pressure. ?Body temperature. ?Skin for abnormal spots. ?What immunizations do I need? ?Vaccines are usually given at various ages, according to a schedule. Your health care provider will recommend vaccines for you based on your age, medical history, and lifestyle or other factors, such as travel or where you work. ?What tests do I need? ?Screening ?Your health care provider may recommend screening tests for certain conditions. This may include: ?Pelvic exam and Pap test. ?Lipid and cholesterol levels. ?Diabetes screening. This is done by checking your blood sugar (glucose) after you have not eaten for a while (fasting). ?Hepatitis B test. ?Hepatitis C test. ?HIV (human immunodeficiency virus) test. ?STI (sexually transmitted infection) testing, if you are at risk. ?BRCA-related cancer screening. This may be done if you have a family history of breast, ovarian, tubal, or peritoneal cancers. ?Talk with your health care provider about your test results, treatment options, and if necessary, the need for more tests. ?Follow these instructions at home: ?Eating and drinking ? ?Eat a healthy diet that includes fresh fruits and vegetables, whole grains, lean protein, and low-fat dairy products. ?Take vitamin and mineral supplements as recommended by your health care provider. ?Do not drink alcohol if: ?Your health care provider tells you not to drink. ?You are pregnant, may be pregnant, or are planning to become pregnant. ?If you drink alcohol: ?Limit how much you have to 0-1 drink a day. ?Know how much alcohol is in your drink. In the [place] one drink equals one 12 oz bottle of beer (355 mL), one 5 oz glass of wine (148 mL), or one 1? oz glass of hard liquor (44  mL). ?Lifestyle ?Brush your teeth every morning and night with fluoride toothpaste. Floss one time each day. ?Exercise for at least 30 minutes 5 or more days each week. ?Do not use any products that contain  nicotine or tobacco. These products include cigarettes, chewing tobacco, and vaping devices, such as e-cigarettes. If you need help quitting, ask your health care provider. ?Do not use drugs. ?If you are sexually active, practice safe sex. Use a condom or other form of protection to prevent STIs. ?If you do not wish to become pregnant, use a form of birth control. If you plan to become pregnant, see your health care provider for a prepregnancy visit. ?Find healthy ways to manage stress, such as: ?Meditation, yoga, or listening to music. ?Journaling. ?Talking to a trusted person. ?Spending time with friends and family. ?Minimize exposure to UV radiation to reduce your risk of skin cancer. ?Safety ?Always wear your seat belt while driving or riding in a vehicle. ?Do not drive: ?If you have been drinking alcohol. Do not ride with someone who has been drinking. ?If you have been using any mind-altering substances or drugs. ?While texting. ?When you are tired or distracted. ?Wear a helmet and other protective equipment during sports activities. ?If you have firearms in your house, make sure you follow all gun safety procedures. ?Seek help if you have been physically or sexually abused. ?What's next? ?Go to your health care provider once a year for an annual wellness visit. ?Ask your health care provider how often you should have your eyes and teeth checked. ?Stay up to date on all vaccines. ?This information is not intended to replace advice given to you by your health care provider. Make sure you discuss any questions you have with your health care provider. ?Document Revised: 02/01/2021 Document Reviewed: 02/01/2021 ?Elsevier Patient Education ? [employer]. ? ?

## 2021-11-30 NOTE — Assessment & Plan Note (Signed)
Patient non compliant to Glipizide and metformin, no good reason except for GI upset with metformin. ? ?Long discussion regarding the importance of medication compliance. Intolerant to Metformin XR.  ? ?Given her forgetfulness to take medications, will trial Ozempic 0.25 mg weekly x 4 weeks, then increase to 0.5 mg weekly thereafter.  She did well with Trulicity previously, her insurance stopped covering. ? ?If insurance will not cover Ozempic, then will initiate glipizide XL 5 mg daily. ? ?We discussed the need to work on her diet and to start exercising. ? ?Follow-up in 3 months ?

## 2021-11-30 NOTE — Assessment & Plan Note (Signed)
History of. ? ?Chronic ice craving since January 2023. ? ?Repeat CBC and iron studies pending. ?

## 2021-11-30 NOTE — Assessment & Plan Note (Signed)
Immunizations up-to-date. ?Pap smear due, completed today. ? ?Discussed the importance of a healthy diet and regular exercise in order for weight loss, and to reduce the risk of further co-morbidity. ? ?Exam today stable. ?Labs pending. ?

## 2021-12-01 ENCOUNTER — Telehealth: Payer: Self-pay | Admitting: *Deleted

## 2021-12-01 LAB — HEPATITIS C ANTIBODY
Hepatitis C Ab: NONREACTIVE
SIGNAL TO CUT-OFF: 0.16 (ref <1.00)

## 2021-12-01 NOTE — Telephone Encounter (Signed)
Pt went to pick up he Ozempic and the pharmacy let her know it will need a PA completed. I advised pt I will route the message to assistant and once they get a response back from pt's insurance they will f/u with her, I did let pt know the process can take a few days  ? ?FYI to Joellen  ?

## 2021-12-04 LAB — CYTOLOGY - PAP
Comment: NEGATIVE
Diagnosis: NEGATIVE
High risk HPV: NEGATIVE

## 2021-12-11 ENCOUNTER — Telehealth: Payer: Self-pay | Admitting: Primary Care

## 2021-12-11 NOTE — Telephone Encounter (Signed)
Pt would like to speak to the nurse, to maybe switch the medication to something else. She does not want to pay the high deductible.  ? ?Medication: Semaglutide,0.25 or 0.5MG /DOS, (OZEMPIC, 0.25 OR 0.5 MG/DOSE,) 2 MG/1.5ML SOPN ?

## 2021-12-11 NOTE — Telephone Encounter (Signed)
Patient notified by telephone that she needs to contact her [employer] and find out what medication they will cover. Patient will check with her [employer] and call the office back. ?

## 2022-01-25 ENCOUNTER — Ambulatory Visit: Payer: 59 | Admitting: Primary Care

## 2022-01-25 ENCOUNTER — Encounter: Payer: Self-pay | Admitting: Primary Care

## 2022-01-25 VITALS — BP 136/88 | HR 108 | Temp 98.6°F | Ht 70.0 in | Wt 254.0 lb

## 2022-01-25 DIAGNOSIS — Z3201 Encounter for pregnancy test, result positive: Secondary | ICD-10-CM | POA: Diagnosis not present

## 2022-01-25 DIAGNOSIS — E1165 Type 2 diabetes mellitus with hyperglycemia: Secondary | ICD-10-CM

## 2022-01-25 DIAGNOSIS — O24111 Pre-existing diabetes mellitus, type 2, in pregnancy, first trimester: Secondary | ICD-10-CM | POA: Diagnosis not present

## 2022-01-25 LAB — POCT URINE PREGNANCY: Preg Test, Ur: POSITIVE — AB

## 2022-01-25 MED ORDER — LANTUS SOLOSTAR 100 UNIT/ML ~~LOC~~ SOPN: 5.0000 [IU] | PEN_INJECTOR | Freq: Every day | SUBCUTANEOUS | 0 refills | Status: DC

## 2022-01-25 MED ORDER — FREESTYLE LIBRE 3 SENSOR MISC: 3 refills | Status: DC

## 2022-01-25 MED ORDER — PEN NEEDLES 31G X 6 MM MISC: 0 refills | Status: DC

## 2022-01-25 NOTE — Assessment & Plan Note (Signed)
hCG quantitative lab test pending.  We discussed proper prenatal care including prenatal vitamins and to avoid all medications unless discussed with me first.  She will follow-up with OB/GYN as scheduled.

## 2022-01-25 NOTE — Patient Instructions (Addendum)
Use the freestyle libre to check glucose levels continuously.   Ask your OB/GYN for recommendations for endocrinology during pregnancy.   Start Lantus insulin. Inject 5 units into the skin every evening at bedtime.  Continue checking your blood sugars, notify me if you see readings consistently out of these ranges  Target glucose levels 5 11 25  Fasting: 95 mg/dL or less 1-hour postprandial: 140 mg/dL or less 2-hour postprandial: 120 mg/dL or less  Schedule follow-up visit with me for 1 month for diabetes check.  It was a pleasure to see you today!

## 2022-01-25 NOTE — Progress Notes (Signed)
Subjective:    Patient ID: Julia Kline, female    DOB: 13-Oct-1985, 36 y.o.   MRN: [id number]  HPI  Julia Kline is a very pleasant 36 y.o. female with a history of type 2 diabetes, migraines, hypertension, abnormal uterine bleeding, anemia who presents today for evaluation of potential pregnancy.  Last menstrual period was 12/18/21. She's noticed symptoms of lower back pain, nausea without vomiting. She denies breast tenderness.   She's had two positive home pregnancy tests this week. She does have an OB/GYN, has an appointment scheduled with the nurse on 02/23/22 for ultrasound.   She stopped Glipizide last week as she didn't know if this was safe to take during pregnancy.  She cannot afford Ozempic which was sent to her pharmacy during her last visit.  She has a history of gestational diabetes, was managed on insulin at time.  She does not recall which type.  She is checking her glucose levels infrequently overall, but has been monitoring more closely this week.  Readings have been in the high 100s mostly.  She was working on her diet and starting to exercise.  Wt Readings from Last 3 Encounters:  01/25/22 254 lb (115.2 kg)  11/30/21 258 lb (117 kg)  10/10/21 249 lb (112.9 kg)      Review of Systems  Eyes:  Negative for visual disturbance.  Gastrointestinal:  Positive for nausea. Negative for vomiting.  Genitourinary:  Negative for dysuria and frequency.  Musculoskeletal:  Positive for back pain.  Neurological:  Negative for numbness.         Past Medical History:  Diagnosis Date   Acute conjunctivitis of right eye 07/10/2019   Acute pain of right knee 08/06/2019   Complicated UTI (urinary tract infection) 03/10/2021   COVID-19    Diabetes mellitus without complication (HCC)    Eczema    Hernia cerebri (HCC)    History of COVID-19 12/21/2019   History of gestational diabetes 05/02/2017   Normal pp testing 10/2017   History of severe pre-eclampsia  05/02/2017   Hypertension    on no medications   Mastitis 06/18/2019   Migraine    PCOS (polycystic ovarian syndrome)    Pneumonia    Pregnancy induced hypertension     Social History   Socioeconomic History   Marital status: Married    Spouse name: Not on file   Number of children: Not on file   Years of education: Not on file   Highest education level: Not on file  Occupational History   Not on file  Tobacco Use   Smoking status: Never   Smokeless tobacco: Never  Vaping Use   Vaping Use: Never used  Substance and Sexual Activity   Alcohol use: No   Drug use: No   Sexual activity: Yes    Birth control/protection: Injection  Other Topics Concern   Not on file  Social History Narrative   Married.   2 children.   Works at [employer].   Social Determinants of Health   Financial Resource Strain: Not on file  Food Insecurity: Not on file  Transportation Needs: Not on file  Physical Activity: Not on file  Stress: Not on file  Social Connections: Not on file  Intimate Partner Violence: Not on file    Past Surgical History:  Procedure Laterality Date   CESAREAN SECTION  2017   Procedure: CESAREAN DELIVERY ONLY; Surgeon: Marian SorrowKalpen Navin Patel, MD; [id number]; Service: Obstetrics  CESAREAN SECTION N/A 10/02/2017   Procedure: CESAREAN SECTION;  Surgeon: Tilda Burrow, MD;  Location: [facility] SU[facility];  Service: Obstetrics;  Laterality: N/A;   I & D EXTREMITY Left 10/14/2020   Procedure: Irrigation and debridement left small and ring finger with repair of tendon;  Surgeon: Dominica Severin, MD;  Location: [facility] OR;  Service: Orthopedics;  Laterality: Left;  1 hr Block with IV Sed   LAPAROSCOPIC OOPHERECTOMY Left    LEEP     OVARIAN CYST REMOVAL     TONSILLECTOMY     WISDOM TOOTH EXTRACTION      Family History  Problem Relation Age of Onset   Diabetes Mother    Migraines Mother    Diabetes Father    Hypertension Father    Hypertension  Maternal Grandmother    Diabetes Maternal Grandmother    Diabetes Maternal Grandfather    COPD Maternal Grandfather    Diabetes Paternal Grandmother    Hypertension Paternal Grandmother    Diabetes Paternal Grandfather    Hypertension Paternal Grandfather    Heart attack Paternal Grandfather    Asthma Sister     Allergies  Allergen Reactions   Ancef [Cefazolin] Hives and Swelling    Facial and neck swelling; Had reaction after receiving fentanyl and ancef in the OR   Fentanyl Hives and Swelling    Facial and neck swelling; Had reaction after receiving fentanyl and ancef in the OR   Shrimp [Shellfish Allergy] Swelling and Other (See Comments)    Swelling of throat   Morphine And Related Rash   Pseudoephedrine Hcl Other (See Comments)    Light headed/ dizzy    Current Outpatient Medications on File Prior to Visit  Medication Sig Dispense Refill   acetaminophen (TYLENOL) 500 MG tablet Take 1,000 mg by mouth every 6 (six) hours as needed for mild pain or moderate pain.     glipiZIDE (GLUCOTROL XL) 5 MG 24 hr tablet Take 5 mg by mouth daily.     No current facility-administered medications on file prior to visit.    BP 136/88   Pulse (!) 108   Temp 98.6 F (37 C) (Oral)   Ht 5\' 10"  (1.778 m)   Wt 254 lb (115.2 kg)   SpO2 100%   BMI 36.45 kg/m  Objective:   Physical Exam Cardiovascular:     Rate and Rhythm: Normal rate and regular rhythm.  Pulmonary:     Effort: Pulmonary effort is normal.     Breath sounds: Normal breath sounds.  Musculoskeletal:     Cervical back: Neck supple.  Skin:    General: Skin is warm and dry.           Assessment & Plan:   Problem List Items Addressed This Visit       Endocrine   Type 2 diabetes mellitus with hyperglycemia (HCC)   Relevant Medications   glipiZIDE (GLUCOTROL XL) 5 MG 24 hr tablet   Continuous Blood Gluc Sensor (FREESTYLE LIBRE 3 SENSOR) MISC   insulin glargine (LANTUS SOLOSTAR) 100 UNIT/ML Solostar Pen    Insulin Pen Needle (PEN NEEDLES) 31G X 6 MM MISC   Type 2 diabetes mellitus during pregnancy, antepartum, first trimester    A1c six weeks ago at 8.4 which is above goal.  Remain off of glipizide.  Start Lantus 5 units at bedtime. Discussed glucose target ranges.  Prescription for freestyle libre 3 sensor sent to pharmacy, she has a smart phone.  She is also familiar with  freestyle libre.  She was provided with strict instructions regarding monitoring glucose levels and to report if out of range consistently.  We will plan to see her back next month for follow-up.       Relevant Medications   glipiZIDE (GLUCOTROL XL) 5 MG 24 hr tablet   insulin glargine (LANTUS SOLOSTAR) 100 UNIT/ML Solostar Pen     Other   Positive pregnancy test - Primary    hCG quantitative lab test pending.  We discussed proper prenatal care including prenatal vitamins and to avoid all medications unless discussed with me first.  She will follow-up with OB/GYN as scheduled.      Relevant Orders   POCT urine pregnancy   hCG, quantitative, pregnancy       Doreene Nest, NP

## 2022-01-25 NOTE — Assessment & Plan Note (Signed)
A1c six weeks ago at 8.4 which is above goal.  Remain off of glipizide.  Start Lantus 5 units at bedtime. Discussed glucose target ranges.  Prescription for freestyle libre 3 sensor sent to pharmacy, she has a smart phone.  She is also familiar with freestyle libre.  She was provided with strict instructions regarding monitoring glucose levels and to report if out of range consistently.  We will plan to see her back next month for follow-up.

## 2022-01-26 LAB — HCG, QUANTITATIVE, PREGNANCY: Quantitative HCG: 92.98 m[IU]/mL

## 2022-01-29 ENCOUNTER — Encounter: Payer: Self-pay | Admitting: Nurse Practitioner

## 2022-02-02 NOTE — Telephone Encounter (Signed)
Patient called also. Patient states that her sugar level is 360 right now and is having pain around her eyes and head. I sent patient to access nurse to evaluate. Patient is using Lantus insulin 10 units daily.

## 2022-02-02 NOTE — Telephone Encounter (Signed)
Access nurse advised they spoke with patient and that she is not having any other symptoms like urinary frequency or urgency. Would like to see if provider can take a look at this and adjust medication if appropriate. Patient is very worried about this.

## 2022-02-02 NOTE — Telephone Encounter (Signed)
Called pt. HA resolved, sugar logs d/w pt.  See mychart message about sugar check and insulin titration.  She is limiting carbs, d/w pt about adequate fluid intake.  She'll update [place] next week.  Routine cautions given to patient.

## 2022-02-02 NOTE — Telephone Encounter (Signed)
I spoke with Shanda Bumps CMA and Dr Para March is going to review note; see note below the access nurse where Alvina Chou CMA spoke with pt;   [facility] Day - Client TELEPHONE ADVICE RECORD AccessNurse Patient Name: Julia Kline Gender: Female DOB: 1986/04/25 Age: 36 Y 3 M 22 D Return Phone Number: [phone] (Primary) Address: City/ State/ Zip: [place] [address] Client [facility] Day - Client Client Site [facility] - Day Provider Vernona Rieger - NP Contact Type Call Who Is Calling Patient / Member / Family / Caregiver Call Type Triage / Clinical Relationship To Patient Self Return Phone Number [phone] (Primary) Chief Complaint Blood Sugar High Reason for Call Symptomatic / Request for Health Information Initial Comment Caller states that she is calling from the service with a caller on the line. Caller states that she is [redacted] weeks pregnant and her blood sugars are high, BS360. She is having headaches and pain around her eyes. Translation No Nurse Assessment Nurse: Lesly Rubenstein, RN, Crystal Date/Time (Eastern Time): 02/02/2022 3:24:32 PM Confirm and document reason for call. If symptomatic, describe symptoms. ---Caller states that she is [redacted] weeks pregnant and her blood sugars are high, BS 360. She is having headaches and pain around her eyes. Caller states she has been having increased blood sugars for a few days. States she spoke with Dr Chestine Spore and increased her insulin. States her alarm has been going off all day notifying her BS was high. Headaches and eye pain started approx 1 hour ago. 298 mg/dl is her current BS; took insulin at 6am. Has not seen OB yet to confirm pregnancy Does the patient have any new or worsening symptoms? ---Yes Will a triage be completed? ---Yes Related visit to physician within the last 2 weeks? ---Yes Does the PT have any chronic conditions? (i.e. diabetes, asthma, this  includes High risk factors for pregnancy, etc.) ---Yes List chronic conditions. ---DM Is the patient pregnant or possibly pregnant? (Ask all females between the ages of 73-55) ---Yes How many weeks gestation? ---6 What is the estimated delivery date? ---2022-09-24 Total number of pregnancies including current? ---3 PLEASE NOTE: All timestamps contained within this report are represented as [place] Standard Time. CONFIDENTIALTY NOTICE: This fax transmission is intended only for the addressee. It contains information that is legally privileged, confidential or otherwise protected from use or disclosure. If you are not the intended recipient, you are strictly prohibited from reviewing, disclosing, copying using or disseminating any of this information or taking any action in reliance on or regarding this information. If you have received this fax in error, please notify us immediately by telephone so that we can arrange for its return to [place]. Phone: [phone], Toll-Free: [phone], Fax: [phone] Page: 2 of 2 Call Id: [id number] Nurse Assessment Number of live births? ---2 Have you felt decreased fetal movement? ---Early Pregnancy - No Fetal Movement Felt Yet Is this a behavioral health or substance abuse call? ---No Guidelines Guideline Title Affirmed Question Affirmed Notes Nurse Date/Time (Eastern Time) Diabetes - High Blood Sugar [1] Caller has URGENT medication or insulin pump question AND [2] triager unable to answer question Lesly Rubenstein, RN, Crystal 02/02/2022 3:28:27 PM Disp. Time Lamount Cohen Time) Disposition Final User 02/02/2022 3:31:49 PM Call PCP Now Yes Lesly Rubenstein, RN, Crystal Caller Disagree/Comply Comply Caller Understands Yes PreDisposition Call Doctor Care Advice Given Per Guideline CALL PCP NOW: * You need to discuss this with your doctor (or NP/PA). * I'll page the on-call provider  now. If you haven't heard from the provider (or me) within 30 minutes, call again.  ALTERNATE DISPOSITION - CALL YOUR DIABETES SPECIALIST NOW: * If you have a diabetes specialist (doctor, NP, PA), call the specialist now. CALL BACK IF: * You become worse CARE ADVICE given per Diabetes - High Blood Sugar (Adult) guideline. * You have more questions. Comments User: Floyce Stakes, RN Date/Time Lamount Cohen Time): 02/02/2022 3:34:31 PM Caller was not able to hold. Office is going to discuss with provider that is currently covering for NP Clark and have someone call her back

## 2022-02-15 ENCOUNTER — Ambulatory Visit (INDEPENDENT_AMBULATORY_CARE_PROVIDER_SITE_OTHER): Payer: 59 | Admitting: *Deleted

## 2022-02-15 ENCOUNTER — Ambulatory Visit (INDEPENDENT_AMBULATORY_CARE_PROVIDER_SITE_OTHER): Payer: 59

## 2022-02-15 VITALS — Wt 255.0 lb

## 2022-02-15 DIAGNOSIS — O3680X0 Pregnancy with inconclusive fetal viability, not applicable or unspecified: Secondary | ICD-10-CM

## 2022-02-15 DIAGNOSIS — O0990 Supervision of high risk pregnancy, unspecified, unspecified trimester: Secondary | ICD-10-CM

## 2022-02-15 DIAGNOSIS — O09299 Supervision of pregnancy with other poor reproductive or obstetric history, unspecified trimester: Secondary | ICD-10-CM | POA: Insufficient documentation

## 2022-02-15 NOTE — Progress Notes (Cosign Needed)
Currently on 13units of lantus, takes it in the AM  New OB Intake  I explained I am completing New OB Intake today. We discussed her EDD of 10/09/22 that is based on todays scan. LMP of 12/18/21. Pt is G3/P2. I reviewed her allergies, medications, Medical/Surgical/OB history, and appropriate screenings.   Patient Active Problem List   Diagnosis Date Noted   Supervision of high risk pregnancy, antepartum 02/15/2022   History of pre-eclampsia in prior pregnancy, currently pregnant 02/15/2022   Type 2 diabetes mellitus during pregnancy, antepartum, first trimester 01/25/2022   Positive pregnancy test 01/25/2022   Type 2 diabetes mellitus with hyperglycemia (HCC) 12/21/2019   Hypertension 10/13/2019   Migraines 04/01/2018   Obesity in pregnancy, antepartum 08/14/2017    Concerns addressed today  Pt currently on 13 units of Lantus, she takes in the early morning. Has discussed insulin plan with PCP until she see a provider here.    Delivery Plans:  Plans to deliver [facility].    Blood Pressure Cuff  BP cuff given. Discussed to be used for virtual visits and weekly BP checks.    Anatomy US Explained first scheduled [place] will be around 19 weeks.   Labs Discussed Avelina Laine genetic screening with patient. Would like both Panorama and Horizon drawn at new OB visit. Routine prenatal labs needed.   Placed OB Box on problem list and updated   Patient informed that the ultrasound is considered a limited obstetric ultrasound and is not intended to be a complete ultrasound exam.  Patient also informed that the ultrasound is not being completed with the intent of assessing for fetal or placental anomalies or any pelvic abnormalities. Explained that the purpose of today's ultrasound is to assess for dating and fetal heart rate.  Patient acknowledges the purpose of the exam and the limitations of the study.     Ultrasound  [place] dating did not correlate with LMP provided. Pt had HCG drawn on 01/25/22 ,  value 92. Will have Dr Shawnie Pons review scan and labs to see if we need to do a follow up scan.    First visit review I reviewed new OB appt with pt. I explained she will have ob bloodwork with genetic screening, Explained pt will be seen by Dr Shawnie Pons at first visit.    Scheryl Marten, RN 02/15/2022  10:45 AM

## 2022-02-16 ENCOUNTER — Other Ambulatory Visit: Payer: Self-pay | Admitting: *Deleted

## 2022-02-16 DIAGNOSIS — O0990 Supervision of high risk pregnancy, unspecified, unspecified trimester: Secondary | ICD-10-CM

## 2022-03-02 ENCOUNTER — Ambulatory Visit: Payer: 59 | Admitting: Primary Care

## 2022-03-02 NOTE — Telephone Encounter (Signed)
error 

## 2022-03-03 ENCOUNTER — Encounter (HOSPITAL_COMMUNITY): Payer: Self-pay | Admitting: Obstetrics and Gynecology

## 2022-03-03 ENCOUNTER — Inpatient Hospital Stay (HOSPITAL_COMMUNITY)
Admission: AD | Admit: 2022-03-03 | Discharge: 2022-03-03 | Disposition: A | Discharge status: Home / Self Care | Payer: 59 | Attending: Obstetrics and Gynecology | Admitting: Obstetrics and Gynecology

## 2022-03-03 ENCOUNTER — Other Ambulatory Visit: Payer: Self-pay

## 2022-03-03 ENCOUNTER — Inpatient Hospital Stay (HOSPITAL_COMMUNITY): Payer: 59

## 2022-03-03 DIAGNOSIS — Z8616 Personal history of COVID-19: Secondary | ICD-10-CM | POA: Insufficient documentation

## 2022-03-03 DIAGNOSIS — O09521 Supervision of elderly multigravida, first trimester: Secondary | ICD-10-CM | POA: Diagnosis not present

## 2022-03-03 DIAGNOSIS — Z6790 Unspecified blood type, Rh positive: Secondary | ICD-10-CM

## 2022-03-03 DIAGNOSIS — O26891 Other specified pregnancy related conditions, first trimester: Secondary | ICD-10-CM | POA: Insufficient documentation

## 2022-03-03 DIAGNOSIS — D581 Hereditary elliptocytosis: Secondary | ICD-10-CM | POA: Diagnosis not present

## 2022-03-03 DIAGNOSIS — O0990 Supervision of high risk pregnancy, unspecified, unspecified trimester: Secondary | ICD-10-CM

## 2022-03-03 DIAGNOSIS — O021 Missed abortion: Secondary | ICD-10-CM | POA: Diagnosis not present

## 2022-03-03 DIAGNOSIS — Z3A08 8 weeks gestation of pregnancy: Secondary | ICD-10-CM | POA: Insufficient documentation

## 2022-03-03 LAB — URINALYSIS, ROUTINE W REFLEX MICROSCOPIC
Bilirubin Urine: NEGATIVE
Glucose, UA: NEGATIVE mg/dL
Ketones, ur: NEGATIVE mg/dL
Leukocytes,Ua: NEGATIVE
Nitrite: NEGATIVE
Protein, ur: 30 mg/dL — AB
Specific Gravity, Urine: 1.013 (ref 1.005–1.030)
pH: 6 (ref 5.0–8.0)

## 2022-03-03 LAB — CBC WITH DIFFERENTIAL/PLATELET
Abs Immature Granulocytes: 0.04 10*3/uL (ref 0.00–0.07)
Basophils Absolute: 0.1 10*3/uL (ref 0.0–0.1)
Basophils Relative: 1 %
Eosinophils Absolute: 0.1 10*3/uL (ref 0.0–0.5)
Eosinophils Relative: 1 %
HCT: 36.6 % (ref 36.0–46.0)
Hemoglobin: 11.4 g/dL — ABNORMAL LOW (ref 12.0–15.0)
Immature Granulocytes: 0 %
Lymphocytes Relative: 19 %
Lymphs Abs: 1.9 10*3/uL (ref 0.7–4.0)
MCH: 21.4 pg — ABNORMAL LOW (ref 26.0–34.0)
MCHC: 31.1 g/dL (ref 30.0–36.0)
MCV: 68.7 fL — ABNORMAL LOW (ref 80.0–100.0)
Monocytes Absolute: 0.9 10*3/uL (ref 0.1–1.0)
Monocytes Relative: 9 %
Neutro Abs: 7 10*3/uL (ref 1.7–7.7)
Neutrophils Relative %: 70 %
Platelets: 235 10*3/uL (ref 150–400)
RBC: 5.33 MIL/uL — ABNORMAL HIGH (ref 3.87–5.11)
RDW: 17.6 % — ABNORMAL HIGH (ref 11.5–15.5)
WBC: 9.9 10*3/uL (ref 4.0–10.5)
nRBC: 0 % (ref 0.0–0.2)

## 2022-03-03 LAB — HCG, QUANTITATIVE, PREGNANCY: hCG, Beta Chain, Quant, S: 3778 m[IU]/mL — ABNORMAL HIGH (ref <5)

## 2022-03-03 NOTE — Discharge Instructions (Addendum)
FACTS YOU SHOULD KNOW  WHAT IS AN EARLY PREGNANCY FAILURE? Once the egg is fertilized with the sperm and begins to develop, it attaches to the lining of the uterus. This early pregnancy tissue may not develop into an embryo (the beginning stage of a baby). Sometimes an embryo does develop but does not continue to grow. These problems can be seen on ultrasound.   MANAGEMNT OF EARLY PREGNANCY FAILURE: About 4 out of 100 (0.25%) women will have a pregnancy loss in her lifetime.  One in five pregnancies is found to be an early pregnancy failure.  There are 3 ways to care for an early pregnancy failure:   Surgery, (2) Medicine, (3) Waiting for you to pass the pregnancy on your own. The decision as to how to proceed after being diagnosed with and early pregnancy failure is an individual one.  The decision can be made only after appropriate counseling.  You need to weigh the pros and cons of the 3 choices. Then you can make the choice that works for you. SURGERY (D&E) Procedure over in 1 day Requires being put to sleep Bleeding may be light Possible problems during surgery, including injury to womb(uterus) Care provider has more control Medicine (CYTOTEC) The complete procedure may take days to weeks No Surgery Bleeding may be heavy at times There may be drug side effects Patient has more control Waiting You may choose to wait, in which case your own body may complete the passing of the abnormal early pregnancy on its own in about 2-4 weeks Your bleeding may be heavy at times There is a small possibility that you may need surgery if the bleeding is too much or not all of the pregnancy has passed. CYTOTEC MANAGEMENT Prostaglandins (cytotec) are the most widely used drug for this purpose. They cause the uterus to cramp and contract. You will place the medicine yourself inside your vagina in the privacy of your home. Empting of the uterus should occur within 3 days but the process may continue for  several weeks. The bleeding may seem heavy at times. POSSIBLE SIDE EFFECTS FROM CYTOTEC Nausea   Vomiting Diarrhea Fever Chills  Hot Flashes Side effects  from the process of the early pregnancy failure include: Cramping  Bleeding Headaches  Dizziness RISKS: This is a low risk procedure. Less than 1 in 100 women has a complication. An incomplete passage of the early pregnancy may occur. Also, Hemorrhage (heavy bleeding) could happen.  Rarely the pregnancy will not be passed completely. Excessively heavy bleeding may occur.  Your doctor may need to perform surgery to empty the uterus (D&E). Afterwards: Everybody will feel differently after the early pregnancy completion. You may have soreness or cramps for a day or two. You may have soreness or cramps for day or two.  You may have light bleeding for up to 2 weeks. You may be as active as you feel like being. If you have any of the following problems you may call Maternity Admissions Unit at [phone]. If you have pain that does not get better  with pain medication Bleeding that soaks through 2 thick full-sized sanitary pads in an hour Cramps that last longer than 2 days Foul smelling discharge Fever above 100.4 degrees F Even if you do not have any of these symptoms, you should have a follow-up exam to make sure you are healing properly. This appointment will be made for you before you leave the hospital. Your next normal period will start again in 4-6   week after the loss. You can get pregnant soon after the loss, so use birth control right away. Finally: Make sure all your questions are answered before during and after any procedure. Follow up with medical care and family planning methods.          [employer]: [employer]  [facility] (in-person or virtual visits): [phone] [address] [place],  [place] [address]

## 2022-03-03 NOTE — MAU Note (Signed)
Julia Kline is a 36 y.o. at [redacted]w[redacted]d here in MAU reporting: lower back pain and VB.  Reports VB began today, states applied sanitary napkin and it's almost completely saturated.  Reports VB is similar to menstrual cycle. States back pain has been intermittent since onset of pregnancy.Denies recent intercourse.  Onset of complaint: today Pain score: 6/10 back pain Vitals:   03/03/22 1411  BP: (!) 137/97  Pulse: (!) 103  Resp: 18  Temp: 98.8 F (37.1 C)  SpO2: 100%     FHT:N/A Lab orders placed from triage:   UA

## 2022-03-03 NOTE — MAU Provider Note (Signed)
History     CSN: [id number]  Arrival date and time: 03/03/22 1342   Event Date/Time   First Provider Initiated Contact with Patient 03/03/22 1450      Chief Complaint  Patient presents with   Back Pain   Vaginal Bleeding   Julia Kline is a 36 y.o. G3P2002 at [redacted]w[redacted]d who presents to MAU for vaginal bleeding which began this morning. Patient states at first the bleeding was red, but now shows provider a pad with light brown blood that she wore to MAU. Patient states the bleeding is light and is not soaking the pad. Patient reports back pain since the beginning of pregnancy and a cramp when she was getting in to bed, but otherwise denies pain. Patient's husband present for entire visit. Blood type O+.   Pt denies vaginal discharge/odor/itching. Pt denies N/V, abdominal pain, constipation, diarrhea, or urinary problems. Pt denies fever, chills, fatigue, sweating or changes in appetite. Pt denies SOB or chest pain. Pt denies dizziness, HA, light-headedness, weakness.    OB History     Gravida  3   Para  2   Term  2   Preterm  0   AB  0   Living  2      SAB  0   IAB  0   Ectopic  0   Multiple  0   Live Births  2           Past Medical History:  Diagnosis Date   Acute conjunctivitis of right eye 07/10/2019   Acute pain of right knee 08/06/2019   Anemia 10/14/2019   Complicated UTI (urinary tract infection) 03/10/2021   COVID-19    Diabetes mellitus without complication (HCC)    Eczema    Hernia cerebri (HCC)    Hidradenitis axillaris 04/20/2020   Followed by [facility]. Last seen 05/26/2020: instructed to wash b/l with Hibiclens and take Doxycycline as prescribed and follow up in one month.    History of COVID-19 12/21/2019   History of gestational diabetes 05/02/2017   Normal pp testing 10/2017   History of severe pre-eclampsia 05/02/2017   Hypertension    on no medications   Impingement syndrome of left shoulder 02/16/2020    Mastitis 06/18/2019   Migraine    PCOS (polycystic ovarian syndrome)    Pneumonia    Pregnancy induced hypertension    Skin lesions 09/12/2020    Past Surgical History:  Procedure Laterality Date   CESAREAN SECTION  2017   Procedure: CESAREAN DELIVERY ONLY; Surgeon: Marian Sorrow, MD; Location: [facility]; Service: Obstetrics   CESAREAN SECTION N/A 10/02/2017   Procedure: CESAREAN SECTION;  Surgeon: Tilda Burrow, MD;  Location: [facility] SU[facility];  Service: Obstetrics;  Laterality: N/A;   I & D EXTREMITY Left 10/14/2020   Procedure: Irrigation and debridement left small and ring finger with repair of tendon;  Surgeon: Dominica Severin, MD;  Location: [facility] OR;  Service: Orthopedics;  Laterality: Left;  1 hr Block with IV Sed   LAPAROSCOPIC OOPHERECTOMY Left    LEEP     OVARIAN CYST REMOVAL     TONSILLECTOMY     WISDOM TOOTH EXTRACTION      Family History  Problem Relation Age of Onset   Diabetes Mother    Migraines Mother    Diabetes Father    Hypertension Father    Hypertension Maternal Grandmother    Diabetes Maternal Grandmother    Diabetes Maternal Grandfather  COPD Maternal Grandfather    Diabetes Paternal Grandmother    Hypertension Paternal Grandmother    Diabetes Paternal Grandfather    Hypertension Paternal Grandfather    Heart attack Paternal Grandfather    Asthma Sister     Social History   Tobacco Use   Smoking status: Never   Smokeless tobacco: Never  Vaping Use   Vaping Use: Never used  Substance Use Topics   Alcohol use: No   Drug use: No    Allergies:  Allergies  Allergen Reactions   Ancef [Cefazolin] Hives and Swelling    Facial and neck swelling; Had reaction after receiving fentanyl and ancef in the OR   Fentanyl Hives and Swelling    Facial and neck swelling; Had reaction after receiving fentanyl and ancef in the OR   Shrimp [Shellfish Allergy] Swelling and Other (See Comments)    Swelling of throat   Morphine And  Related Rash   Pseudoephedrine Hcl Other (See Comments)    Light headed/ dizzy    Medications Prior to Admission  Medication Sig Dispense Refill Last Dose   insulin glargine (LANTUS SOLOSTAR) 100 UNIT/ML Solostar Pen Inject 5 Units into the skin daily. for diabetes. (Patient taking differently: Inject 14 Units into the skin daily. for diabetes.) 15 mL 0 03/02/2022 at 0600   Prenatal Vit-Fe Fumarate-FA (MULTIVITAMIN-PRENATAL) 27-0.8 MG TABS tablet Take 1 tablet by mouth daily at 12 noon.   03/02/2022 at 0900   acetaminophen (TYLENOL) 500 MG tablet Take 1,000 mg by mouth every 6 (six) hours as needed for mild pain or moderate pain.      Continuous Blood Gluc Sensor (FREESTYLE LIBRE 3 SENSOR) MISC Place 1 sensor on the skin every 14 days. Use to check glucose continuously. 6 each 3    Insulin Pen Needle (PEN NEEDLES) 31G X 6 MM MISC Use nightly with insulin. 100 each 0     Review of Systems  Constitutional:  Negative for chills, diaphoresis, fatigue and fever.  Eyes:  Negative for visual disturbance.  Respiratory:  Negative for shortness of breath.   Cardiovascular:  Negative for chest pain.  Gastrointestinal:  Negative for abdominal pain, constipation, diarrhea, nausea and vomiting.  Genitourinary:  Positive for vaginal bleeding. Negative for dysuria, flank pain, frequency, pelvic pain, urgency and vaginal discharge.  Musculoskeletal:  Positive for back pain.  Neurological:  Negative for dizziness, weakness, light-headedness and headaches.   Physical Exam   Blood pressure 136/90, pulse 98, temperature 98.8 F (37.1 C), temperature source Oral, resp. rate 18, height  (1.753 m), weight 115 kg, last menstrual period 12/18/2021, SpO2 98 %.  Patient Vitals for the past 24 hrs:  BP Temp Temp src Pulse Resp SpO2 Height Weight  03/03/22 1418 136/90 -- -- 98 -- 98 % -- --  03/03/22 1411 (!) 137/97 98.8 F (37.1 C) Oral (!) 103 18 100 % -- --  03/03/22 1405 -- -- -- -- -- --  (1.753 m)  115 kg   Physical Exam Vitals and nursing note reviewed.  Constitutional:      General: She is not in acute distress.    Appearance: Normal appearance. She is not ill-appearing, toxic-appearing or diaphoretic.  HENT:     Head: Normocephalic and atraumatic.  Pulmonary:     Effort: Pulmonary effort is normal.  Neurological:     Mental Status: She is alert and oriented to person, place, and time.  Psychiatric:        Mood and Affect: Mood  normal.        Behavior: Behavior normal.        Thought Content: Thought content normal.        Judgment: Judgment normal.    Results for orders placed or performed during the hospital encounter of 03/03/22 (from the past 24 hour(s))  Urinalysis, Routine w reflex microscopic Urine, Clean Catch     Status: Abnormal   Collection Time: 03/03/22  2:18 PM  Result Value Ref Range   Color, Urine YELLOW YELLOW   APPearance HAZY (A) CLEAR   Specific Gravity, Urine 1.013 1.005 - 1.030   pH 6.0 5.0 - 8.0   Glucose, UA NEGATIVE NEGATIVE mg/dL   Hgb urine dipstick LARGE (A) NEGATIVE   Bilirubin Urine NEGATIVE NEGATIVE   Ketones, ur NEGATIVE NEGATIVE mg/dL   Protein, ur 30 (A) NEGATIVE mg/dL   Nitrite NEGATIVE NEGATIVE   Leukocytes,Ua NEGATIVE NEGATIVE   RBC / HPF 0-5 0 - 5 RBC/hpf   WBC, UA 6-10 0 - 5 WBC/hpf   Bacteria, UA RARE (A) NONE SEEN   Squamous Epithelial / LPF 0-5 0 - 5   Mucus PRESENT   CBC with Differential/Platelet     Status: Abnormal   Collection Time: 03/03/22  2:53 PM  Result Value Ref Range   WBC 9.9 4.0 - 10.5 K/uL   RBC 5.33 (H) 3.87 - 5.11 MIL/uL   Hemoglobin 11.4 (L) 12.0 - 15.0 g/dL   HCT 16.136.6 09.636.0 - 04.546.0 %   MCV 68.7 (L) 80.0 - 100.0 fL   MCH 21.4 (L) 26.0 - 34.0 pg   MCHC 31.1 30.0 - 36.0 g/dL   RDW 40.917.6 (H) 81.111.5 - 91.415.5 %   Platelets 235 150 - 400 K/uL   nRBC 0.0 0.0 - 0.2 %   Neutrophils Relative % 70 %   Neutro Abs 7.0 1.7 - 7.7 K/uL   Lymphocytes Relative 19 %   Lymphs Abs 1.9 0.7 - 4.0 K/uL   Monocytes Relative  9 %   Monocytes Absolute 0.9 0.1 - 1.0 K/uL   Eosinophils Relative 1 %   Eosinophils Absolute 0.1 0.0 - 0.5 K/uL   Basophils Relative 1 %   Basophils Absolute 0.1 0.0 - 0.1 K/uL   Immature Granulocytes 0 %   Abs Immature Granulocytes 0.04 0.00 - 0.07 K/uL   Ovalocytes PRESENT   hCG, quantitative, pregnancy     Status: Abnormal   Collection Time: 03/03/22  2:53 PM  Result Value Ref Range   hCG, Beta Chain, Quant, S 3,778 (H) <5 mIU/mL   US OB LESS THAN 14 WEEKS WITH OB TRANSVAGINAL  Result Date: 03/03/2022 CLINICAL DATA:  Vaginal bleeding EXAM: OBSTETRIC <14 WK US AND TRANSVAGINAL OB US TECHNIQUE: Both transabdominal and transvaginal ultrasound examinations were performed for complete evaluation of the gestation as well as the maternal uterus, adnexal regions, and pelvic cul-de-sac. Transvaginal technique was performed to assess early pregnancy. COMPARISON:  None Available. FINDINGS: Intrauterine gestational sac: Single Yolk sac:  Not Visualized. Embryo:  Visualized. Cardiac Activity: Not Visualized. CRL: 12.7 mm   7 w   4 d                  US EDC: October 16, 2022 Subchorionic hemorrhage:  None visualized. Maternal uterus/adnexae: Free fluid in the pelvis, adjacent to the adnexa. Right ovary demonstrates a corpus luteum cyst. The left ovary was not visualized. IMPRESSION: 1. The IUP no longer demonstrates cardiac activity. Findings are consistent with fetal demise. Electronically Signed  By: Gerome Sam III M.D.   On: 03/03/2022 16:39   US OB Limited  Result Date: 02/22/2022 ----------------------------------------------------------------------  OBSTETRICS REPORT                       (Signed Final 02/22/2022 08:06 am) ---------------------------------------------------------------------- Patient Info  ID #:       [id number]                          D.O.B.:  [birth year] (36 yrs)  Name:       Julia Kline            Visit Date: 02/15/2022 10:53 am  ---------------------------------------------------------------------- Performed By  Attending:        Tinnie Gens MD         Ref. Address:     [address]                                                             [address]  Performed By:     Scheryl Marten RN     [facility]  Referred By:      [facility] Mila Merry ---------------------------------------------------------------------- Orders  #  Description                           Code        Ordered By  1  US OB LIMITED                         [id number]     Jaynie Collins ----------------------------------------------------------------------  #  Order #                     Accession #                Episode #  1  [id number]                   [id number]                 [id number] ----------------------------------------------------------------------  Indications  Less than [redacted] weeks gestation of pregnancy       Z3A.01 ---------------------------------------------------------------------- Fetal Evaluation  Num Of Fetuses:         1  Fetal Heart Rate(bpm):  123  Cardiac Activity:       Observed ---------------------------------------------------------------------- Biometry  CRL:       4.4  mm     G. Age:  6w 1d                   EDD:   10/10/22 ---------------------------------------------------------------------- OB History  Gravidity:    3         Term:   2        Prem:   0        SAB:   0  TOP:          0       Ectopic:  0        Living: 2 ---------------------------------------------------------------------- Gestational Age  LMP:           8w 3d         Date:  12/18/21                   EDD:   09/24/22  Best:          6w 1d      Det. By:  U/S C R L (02/15/22)     EDD:   10/10/22 ----------------------------------------------------------------------  Comments  CRL did not correlate with LMP provided ---------------------------------------------------------------------- Impression  Single living IUP at 6 wks 1 d  Size does not equal dates  LMP by today's u/s is 10/10/2022 ---------------------------------------------------------------------- Recommendations  Follow-up as clinically indicated. ----------------------------------------------------------------------                  Tinnie Gens, MD Electronically Signed Final Report   02/22/2022 08:06 am ----------------------------------------------------------------------   MAU Course  Procedures  MDM -VB with previously confirmed IUP with [facility] on 02/15/2022 -UA: hazy/lg hgb/30PRO/rare bacteria -CBC: H/H 11.4 + ovalocytes -hCG: 3,778 -[place]: confirmed fetal demise -consulted with Dr. Para March re: ovalocytes on CBC. Per Dr. Para March, pt still eligible for cytotec at this time -pt and husband informed about fetal demise -offered expectant management, cytotec, D&C -patient and husband given time to discuss options and process loss alone -patient's mother then comes to room in MAU and options discussed with mother and patient -patient and mother given time to discuss options alone -provider called back to room by patient and patient states she is unsure of which option she would like at this time, and would like to both have a message sent to the scheduling coordinator for a D&C and also an appointment early this week with the office to discuss options again -pt discharged to home in stable condition  Orders Placed This Encounter  Procedures   Culture, OB Urine    Standing Status:   Standing    Number of Occurrences:   1   US OB LESS THAN 14 WEEKS WITH OB TRANSVAGINAL    Standing Status:   Standing    Number of Occurrences:   1    Order Specific Question:   Symptom/Reason for Exam    Answer:   Vaginal bleeding in pregnancy [[id number]]   Urinalysis, Routine w reflex microscopic Urine, Clean Catch     Standing Status:   Standing    Number of Occurrences:   1   CBC with Differential/Platelet    Standing Status:   Standing  Number of Occurrences:   1   hCG, quantitative, pregnancy    Standing Status:   Standing    Number of Occurrences:   1   No orders of the defined types were placed in this encounter.  Assessment and Plan   1. Supervision of high risk pregnancy, antepartum   2. Missed abortion   3. [redacted] weeks gestation of pregnancy   4. Blood type, Rh positive   5. Ovalocytosis (HCC)     Allergies as of 03/03/2022       Reactions   Ancef [cefazolin] Hives, Swelling   Facial and neck swelling; Had reaction after receiving fentanyl and ancef in the OR   Fentanyl Hives, Swelling   Facial and neck swelling; Had reaction after receiving fentanyl and ancef in the OR   Shrimp [shellfish Allergy] Swelling, Other (See Comments)   Swelling of throat   Morphine And Related Rash   Pseudoephedrine Hcl Other (See Comments)   Light headed/ dizzy        Medication List     TAKE these medications    acetaminophen 500 MG tablet Commonly known as: TYLENOL Take 1,000 mg by mouth every 6 (six) hours as needed for mild pain or moderate pain.   FreeStyle Libre 3 Sensor Misc Place 1 sensor on the skin every 14 days. Use to check glucose continuously.   Lantus SoloStar 100 UNIT/ML Solostar Pen Generic drug: insulin glargine Inject 5 Units into the skin daily. for diabetes. What changed: how much to take   multivitamin-prenatal 27-0.8 MG Tabs tablet Take 1 tablet by mouth daily at 12 noon.   Pen Needles 31G X 6 MM Misc Use nightly with insulin.       -referral to hematology -message sent to [facility] to schedule [facility] -message sent to [facility] to schedule for provider visit early this week -discussed s/sx of SAB -return MAU precautions given -pt discharged to home in stable condition  Odie Sera Anntonette Madewell 03/03/2022, 4:42 PM

## 2022-03-04 LAB — CULTURE, OB URINE: Culture: <10000 — AB

## 2022-03-05 ENCOUNTER — Encounter (HOSPITAL_COMMUNITY): Payer: Self-pay | Admitting: Obstetrics & Gynecology

## 2022-03-05 ENCOUNTER — Other Ambulatory Visit: Payer: Self-pay

## 2022-03-05 ENCOUNTER — Telehealth (HOSPITAL_BASED_OUTPATIENT_CLINIC_OR_DEPARTMENT_OTHER): Payer: Self-pay | Admitting: *Deleted

## 2022-03-05 ENCOUNTER — Other Ambulatory Visit (HOSPITAL_BASED_OUTPATIENT_CLINIC_OR_DEPARTMENT_OTHER): Payer: Self-pay | Admitting: Obstetrics & Gynecology

## 2022-03-05 ENCOUNTER — Telehealth: Payer: Self-pay

## 2022-03-05 DIAGNOSIS — O021 Missed abortion: Secondary | ICD-10-CM

## 2022-03-05 DIAGNOSIS — Z01818 Encounter for other preprocedural examination: Secondary | ICD-10-CM

## 2022-03-05 NOTE — Telephone Encounter (Signed)
Called patient with surgery information, no answer, unable to leave voicemail, voicemailbox is full.

## 2022-03-05 NOTE — Telephone Encounter (Signed)
-----   Message from Marylen Ponto, NP sent at 03/03/2022  6:20 PM EDT ----- Regarding: D&C Good morning,  This patient was found to have a [redacted]w[redacted]d loss in MAU today and would like to be scheduled for a D&C. Patient may also be interested in Anora testing. Please call her to schedule, pt is aware she will be receiving a phone call.  Thank you, Joni Reining

## 2022-03-05 NOTE — Telephone Encounter (Signed)
Spoke with pt regarding bleeding that was reported when surgery was scheduled. Pt reports that she passed a clot about lime sized yesterday and then another this morning. Denies any other bleeding. Pt is experiencing some cramping. Advised that she could take tylenol or ibuprofen to help with pain. Advised that if her bleeding increases to where she is soaking a pad an hour or if the cramping is not tolerable she should go to MAU for evaluation. Pt would like anora testing done with procedure tomorrow.

## 2022-03-05 NOTE — Progress Notes (Signed)
Spoke with pt for pre-op call. Pt denies cardiac history, pt had hypertension only with pregnancies. Pt is a type 2 diabetic. Last A1C was 8.4 on 11/30/21. She states her fasting blood sugar is usually between 170-179. Instructed pt to take 1/2 of her regular dose of Lantus insulin in the AM. Instructed pt to check her blood sugar when she wakes up in the AM and every 2 hours until she leaves for the hospital. If blood sugar is 70 or below, treat with 1/2 cup of clear juice (apple or cranberry) and recheck blood sugar 15 minutes after drinking juice. If blood sugar continues to be 70 or below, call the Short Stay department and ask to speak to a nurse. Also, if blood sugar is greater than 220, call and ask to speak to a nurse. Pt voiced understanding.  Shower instructions given to pt.

## 2022-03-06 ENCOUNTER — Ambulatory Visit (HOSPITAL_COMMUNITY): Payer: 59

## 2022-03-06 ENCOUNTER — Encounter (HOSPITAL_COMMUNITY)
Admission: RE | Disposition: A | Discharge status: Home / Self Care | Payer: Self-pay | Source: Home / Self Care | Attending: Obstetrics & Gynecology

## 2022-03-06 ENCOUNTER — Ambulatory Visit (HOSPITAL_BASED_OUTPATIENT_CLINIC_OR_DEPARTMENT_OTHER): Payer: 59

## 2022-03-06 ENCOUNTER — Other Ambulatory Visit: Payer: Self-pay

## 2022-03-06 ENCOUNTER — Ambulatory Visit (HOSPITAL_COMMUNITY)
Admission: RE | Admit: 2022-03-06 | Discharge: 2022-03-06 | Disposition: A | Discharge status: Home / Self Care | Payer: 59 | Attending: Obstetrics & Gynecology | Admitting: Obstetrics & Gynecology

## 2022-03-06 ENCOUNTER — Encounter (HOSPITAL_COMMUNITY): Payer: Self-pay | Admitting: Obstetrics & Gynecology

## 2022-03-06 DIAGNOSIS — O021 Missed abortion: Secondary | ICD-10-CM | POA: Insufficient documentation

## 2022-03-06 DIAGNOSIS — I10 Essential (primary) hypertension: Secondary | ICD-10-CM | POA: Diagnosis not present

## 2022-03-06 DIAGNOSIS — Z794 Long term (current) use of insulin: Secondary | ICD-10-CM | POA: Insufficient documentation

## 2022-03-06 DIAGNOSIS — E119 Type 2 diabetes mellitus without complications: Secondary | ICD-10-CM | POA: Diagnosis not present

## 2022-03-06 DIAGNOSIS — D649 Anemia, unspecified: Secondary | ICD-10-CM | POA: Diagnosis not present

## 2022-03-06 DIAGNOSIS — Z6837 Body mass index (BMI) 37.0-37.9, adult: Secondary | ICD-10-CM | POA: Insufficient documentation

## 2022-03-06 DIAGNOSIS — Z01818 Encounter for other preprocedural examination: Secondary | ICD-10-CM

## 2022-03-06 HISTORY — PX: DILATION AND EVACUATION: SHX1459

## 2022-03-06 LAB — CBC
HCT: 34.2 % — ABNORMAL LOW (ref 36.0–46.0)
Hemoglobin: 10.4 g/dL — ABNORMAL LOW (ref 12.0–15.0)
MCH: 21.5 pg — ABNORMAL LOW (ref 26.0–34.0)
MCHC: 30.4 g/dL (ref 30.0–36.0)
MCV: 70.8 fL — ABNORMAL LOW (ref 80.0–100.0)
Platelets: 256 10*3/uL (ref 150–400)
RBC: 4.83 MIL/uL (ref 3.87–5.11)
RDW: 17.3 % — ABNORMAL HIGH (ref 11.5–15.5)
WBC: 8.7 10*3/uL (ref 4.0–10.5)
nRBC: 0 % (ref 0.0–0.2)

## 2022-03-06 LAB — GLUCOSE, CAPILLARY
Glucose-Capillary: 133 mg/dL — ABNORMAL HIGH (ref 70–99)
Glucose-Capillary: 107 mg/dL — ABNORMAL HIGH (ref 70–99)

## 2022-03-06 LAB — TYPE AND SCREEN
ABO/RH(D): O POS
Antibody Screen: NEGATIVE

## 2022-03-06 SURGERY — DILATION AND EVACUATION, UTERUS
Anesthesia: General

## 2022-03-06 MED ORDER — PROPOFOL 10 MG/ML IV BOLUS
INTRAVENOUS | Status: AC
  Filled 2022-03-06: qty 20

## 2022-03-06 MED ORDER — POVIDONE-IODINE 10 % EX SWAB: 2.0000 | Freq: Once | CUTANEOUS | Status: DC

## 2022-03-06 MED ORDER — CHLORHEXIDINE GLUCONATE 0.12 % MT SOLN
OROMUCOSAL | Status: AC
  Administered 2022-03-06: 15 mL via OROMUCOSAL
  Filled 2022-03-06: qty 15

## 2022-03-06 MED ORDER — MISOPROSTOL 200 MCG PO TABS
ORAL_TABLET | ORAL | Status: AC
  Filled 2022-03-06: qty 1

## 2022-03-06 MED ORDER — ORAL CARE MOUTH RINSE
15.0000 mL | Freq: Once | OROMUCOSAL | Status: AC
Start: 2022-03-06 — End: 2022-03-06

## 2022-03-06 MED ORDER — SODIUM CHLORIDE 0.9 % IV SOLN
100.0000 mg | Freq: Once | INTRAVENOUS | Status: AC
  Administered 2022-03-06: 100 mg via INTRAVENOUS
  Filled 2022-03-06 (×2): qty 100

## 2022-03-06 MED ORDER — MIDAZOLAM HCL 2 MG/2ML IJ SOLN
INTRAMUSCULAR | Status: AC
  Filled 2022-03-06: qty 2

## 2022-03-06 MED ORDER — PROPOFOL 10 MG/ML IV BOLUS
INTRAVENOUS | Status: DC | PRN
  Administered 2022-03-06: 200 mg via INTRAVENOUS

## 2022-03-06 MED ORDER — MIDAZOLAM HCL 2 MG/2ML IJ SOLN
INTRAMUSCULAR | Status: DC | PRN
  Administered 2022-03-06: 2 mg via INTRAVENOUS

## 2022-03-06 MED ORDER — ACETAMINOPHEN 500 MG PO TABS
1000.0000 mg | ORAL_TABLET | Freq: Once | ORAL | Status: AC
  Administered 2022-03-06: 1000 mg via ORAL
  Filled 2022-03-06: qty 2

## 2022-03-06 MED ORDER — HYDROMORPHONE HCL 1 MG/ML IJ SOLN: 0.2500 mg | INTRAMUSCULAR | Status: DC | PRN

## 2022-03-06 MED ORDER — MISOPROSTOL 100 MCG PO TABS
ORAL_TABLET | ORAL | Status: DC | PRN
  Administered 2022-03-06: 200 ug

## 2022-03-06 MED ORDER — DIPHENHYDRAMINE HCL 50 MG/ML IJ SOLN
INTRAMUSCULAR | Status: DC | PRN
  Administered 2022-03-06: 12.5 mg via INTRAVENOUS

## 2022-03-06 MED ORDER — KETOROLAC TROMETHAMINE 30 MG/ML IJ SOLN
INTRAMUSCULAR | Status: DC | PRN
  Administered 2022-03-06: 30 mg via INTRAVENOUS

## 2022-03-06 MED ORDER — CHLORHEXIDINE GLUCONATE 0.12 % MT SOLN
15.0000 mL | Freq: Once | OROMUCOSAL | Status: AC
Start: 2022-03-06 — End: 2022-03-06

## 2022-03-06 MED ORDER — DEXAMETHASONE SODIUM PHOSPHATE 10 MG/ML IJ SOLN
INTRAMUSCULAR | Status: DC | PRN
  Administered 2022-03-06: 10 mg via INTRAVENOUS

## 2022-03-06 MED ORDER — FENTANYL CITRATE (PF) 250 MCG/5ML IJ SOLN
INTRAMUSCULAR | Status: DC | PRN
  Administered 2022-03-06 (×2): 25 ug via INTRAVENOUS

## 2022-03-06 MED ORDER — 0.9 % SODIUM CHLORIDE (POUR BTL) OPTIME
TOPICAL | Status: DC | PRN
  Administered 2022-03-06: 1000 mL

## 2022-03-06 MED ORDER — ONDANSETRON HCL 4 MG/2ML IJ SOLN
INTRAMUSCULAR | Status: DC | PRN
  Administered 2022-03-06: 4 mg via INTRAVENOUS

## 2022-03-06 MED ORDER — OXYCODONE HCL 5 MG PO TABS: 5.0000 mg | ORAL_TABLET | Freq: Four times a day (QID) | ORAL | 0 refills | Status: DC | PRN

## 2022-03-06 MED ORDER — IBUPROFEN 800 MG PO TABS: 800.0000 mg | ORAL_TABLET | Freq: Three times a day (TID) | ORAL | 0 refills | Status: DC | PRN

## 2022-03-06 MED ORDER — LACTATED RINGERS IV SOLN: INTRAVENOUS | Status: DC

## 2022-03-06 MED ORDER — INSULIN ASPART 100 UNIT/ML IJ SOLN: 0.0000 [IU] | INTRAMUSCULAR | Status: DC | PRN

## 2022-03-06 MED ORDER — FENTANYL CITRATE (PF) 250 MCG/5ML IJ SOLN
INTRAMUSCULAR | Status: AC
  Filled 2022-03-06: qty 5

## 2022-03-06 MED ORDER — LIDOCAINE 2% (20 MG/ML) 5 ML SYRINGE
INTRAMUSCULAR | Status: DC | PRN
  Administered 2022-03-06: 60 mg via INTRAVENOUS

## 2022-03-06 SURGICAL SUPPLY — 22 items
CATH ROBINSON RED A/P 16FR (CATHETERS) ×4 IMPLANT
FILTER UTR ASPR ASSEMBLY (MISCELLANEOUS) ×4 IMPLANT
GLOVE BIOGEL PI IND STRL 7.0 (GLOVE) ×6 IMPLANT
GLOVE BIOGEL PI INDICATOR 7.0 (GLOVE) ×2
GLOVE ECLIPSE 6.5 STRL STRAW (GLOVE) ×8 IMPLANT
GOWN STRL REUS W/ TWL LRG LVL3 (GOWN DISPOSABLE) ×6 IMPLANT
GOWN STRL REUS W/TWL LRG LVL3 (GOWN DISPOSABLE) ×6
HOSE CONNECTING 18IN BERKELEY (TUBING) ×4 IMPLANT
KIT BERKELEY 1ST TRI 3/8 NO TR (MISCELLANEOUS) ×4 IMPLANT
KIT BERKELEY 1ST TRIMESTER 3/8 (MISCELLANEOUS) ×4 IMPLANT
NS IRRIG 1000ML POUR BTL (IV SOLUTION) ×4 IMPLANT
PACK VAGINAL MINOR WOMEN LF (CUSTOM PROCEDURE TRAY) ×4 IMPLANT
PAD OB MATERNITY 4.3X12.25 (PERSONAL CARE ITEMS) ×4 IMPLANT
SET BERKELEY SUCTION TUBING (SUCTIONS) ×4 IMPLANT
SPIKE FLUID TRANSFER (MISCELLANEOUS) ×4 IMPLANT
TOWEL GREEN STERILE FF (TOWEL DISPOSABLE) ×8 IMPLANT
UNDERPAD 30X36 HEAVY ABSORB (UNDERPADS AND DIAPERS) ×4 IMPLANT
VACURETTE 10 RIGID CVD (CANNULA) IMPLANT
VACURETTE 7MM CVD STRL WRAP (CANNULA) IMPLANT
VACURETTE 8 RIGID CVD (CANNULA) IMPLANT
VACURETTE 8MM F TIP (MISCELLANEOUS) ×1 IMPLANT
VACURETTE 9 RIGID CVD (CANNULA) IMPLANT

## 2022-03-06 NOTE — Progress Notes (Signed)
Pt c/o itching, rash, and hives after wiping her body with the chlorhexidine wipes. Observed redness, and hives to top of right shoulder, under the neck, under both breasts, and both groin areas. Warm, wet wash clothes given to the pt to wipe down.

## 2022-03-06 NOTE — H&P (Signed)
Julia Kline is an 36 y.o. female G3P2002 with MAB at 64 w4d for suction D & C and Anora testing.  Blood Type O positive  Menstrual History: Menarche age: 24 Patient's last menstrual period was 12/18/2021.    Past Medical History:  Diagnosis Date   Acute conjunctivitis of right eye 07/10/2019   Acute pain of right knee 08/06/2019   Anemia 10/14/2019   Complicated UTI (urinary tract infection) 03/10/2021   COVID 2020   had pneumonia   COVID-19    Diabetes mellitus without complication (HCC)    Eczema    Hernia cerebri (HCC)    Hidradenitis axillaris 04/20/2020   Followed by [facility]. Last seen 05/26/2020: instructed to wash b/l with Hibiclens and take Doxycycline as prescribed and follow up in one month.    History of COVID-19 12/21/2019   History of gestational diabetes 05/02/2017   Normal pp testing 10/2017   History of severe pre-eclampsia 05/02/2017   Hypertension    on no medications   Impingement syndrome of left shoulder 02/16/2020   knee   Mastitis 06/18/2019   Migraine    PCOS (polycystic ovarian syndrome)    Pneumonia    Pregnancy induced hypertension    Skin lesions 09/12/2020    Past Surgical History:  Procedure Laterality Date   CESAREAN SECTION  2017   Procedure: CESAREAN DELIVERY ONLY; Surgeon: Marian Sorrow, MD; Location: C-SECTION SUITE [facility]; Service: Obstetrics   CESAREAN SECTION N/A 10/02/2017   Procedure: CESAREAN SECTION;  Surgeon: Tilda Burrow, MD;  Location: [facility] BIRTHING SUITES;  Service: Obstetrics;  Laterality: N/A;   I & D EXTREMITY Left 10/14/2020   Procedure: Irrigation and debridement left small and ring finger with repair of tendon;  Surgeon: Dominica Severin, MD;  Location: [facility] OR;  Service: Orthopedics;  Laterality: Left;  1 hr Block with IV Sed   LAPAROSCOPIC OOPHERECTOMY Left    LEEP     OVARIAN CYST REMOVAL     TONSILLECTOMY     WISDOM TOOTH EXTRACTION      Family History  Problem Relation Age of  Onset   Diabetes Mother    Migraines Mother    Diabetes Father    Hypertension Father    Hypertension Maternal Grandmother    Diabetes Maternal Grandmother    Diabetes Maternal Grandfather    COPD Maternal Grandfather    Diabetes Paternal Grandmother    Hypertension Paternal Grandmother    Diabetes Paternal Grandfather    Hypertension Paternal Grandfather    Heart attack Paternal Grandfather    Asthma Sister     Social History:  reports that she has never smoked. She has never used smokeless tobacco. She reports that she does not drink alcohol and does not use drugs.  Allergies:  Allergies  Allergen Reactions   Ancef [Cefazolin] Hives and Swelling    Facial and neck swelling; Had reaction after receiving fentanyl and ancef in the OR   Fentanyl Hives and Swelling    Facial and neck swelling; Had reaction after receiving fentanyl and ancef in the OR   Shrimp [Shellfish Allergy] Swelling and Other (See Comments)    Swelling of throat   Chlorhexidine Hives, Itching and Rash   Morphine And Related Rash   Pseudoephedrine Hcl Other (See Comments)    Light headed/ dizzy    Medications Prior to Admission  Medication Sig Dispense Refill Last Dose   Continuous Blood Gluc Sensor (FREESTYLE LIBRE 3 SENSOR) MISC Place 1 sensor on the  skin every 14 days. Use to check glucose continuously. 6 each 3 03/05/2022   insulin glargine (LANTUS SOLOSTAR) 100 UNIT/ML Solostar Pen Inject 5 Units into the skin daily. for diabetes. (Patient taking differently: Inject 14 Units into the skin in the morning. for diabetes.) 15 mL 0 03/06/2022 at 0639   Insulin Pen Needle (PEN NEEDLES) 31G X 6 MM MISC Use nightly with insulin. 100 each 0 03/05/2022   Prenatal Vit-Fe Fumarate-FA (MULTIVITAMIN-PRENATAL) 27-0.8 MG TABS tablet Take 1 tablet by mouth in the morning.   03/05/2022    Review of Systems  Constitutional: Negative.   Respiratory: Negative.    Cardiovascular: Negative.   Gastrointestinal: Negative.    Genitourinary:  Positive for menstrual problem.    Blood pressure (!) 127/94, pulse (!) 101, temperature 98 F (36.7 C), temperature source Oral, resp. rate 20, height 5\' 9"  (1.753 m), weight 115 kg, last menstrual period 12/18/2021, SpO2 100 %. Physical Exam Constitutional:      Appearance: Normal appearance.  Cardiovascular:     Rate and Rhythm: Normal rate and regular rhythm.  Pulmonary:     Effort: Pulmonary effort is normal.     Breath sounds: Normal breath sounds.  Abdominal:     General: Bowel sounds are normal.     Palpations: Abdomen is soft.  Genitourinary:    Comments: Deferred to OR Neurological:     Mental Status: She is alert.     Results for orders placed or performed during the hospital encounter of 03/06/22 (from the past 24 hour(s))  Glucose, capillary     Status: Abnormal   Collection Time: 03/06/22  2:09 PM  Result Value Ref Range   Glucose-Capillary 133 (H) 70 - 99 mg/dL    No results found.  Assessment/Plan: Missed AB  Pt for suction D & C. R/B/Post op care reviewed with pt. Pt desires to proceed with Anora genetic testing.   03/08/22 03/06/2022, 3:23 PM

## 2022-03-06 NOTE — Transfer of Care (Signed)
Immediate Anesthesia Transfer of Care Note  Patient: Julia Kline  Procedure(s) Performed: DILATATION AND EVACUATION CHROMOSOME STUDIES  Patient Location: PACU  Anesthesia Type:General  Level of Consciousness: awake, drowsy and patient cooperative  Airway & Oxygen Therapy: Patient Spontanous Breathing  Post-op Assessment: Report given to RN and Post -op Vital signs reviewed and stable  Post vital signs: Reviewed and stable  Last Vitals:  Vitals Value Taken Time  BP 127/84 03/06/22 1649  Temp    Pulse 102 03/06/22 1651  Resp 16 03/06/22 1651  SpO2 95 % 03/06/22 1651  Vitals shown include unvalidated device data.  Last Pain:  Vitals:   03/06/22 1523  TempSrc:   PainSc: 10-Worst pain ever      Patients Stated Pain Goal: 2 (03/06/22 1523)  Complications: No notable events documented.

## 2022-03-06 NOTE — Anesthesia Postprocedure Evaluation (Signed)
Anesthesia Post Note  Patient: Art gallery manager  Procedure(s) Performed: DILATATION AND EVACUATION CHROMOSOME STUDIES     Patient location during evaluation: PACU Anesthesia Type: General Level of consciousness: awake and alert Pain management: pain level controlled Vital Signs Assessment: post-procedure vital signs reviewed and stable Respiratory status: spontaneous breathing, nonlabored ventilation and respiratory function stable Cardiovascular status: blood pressure returned to baseline and stable Postop Assessment: no apparent nausea or vomiting Anesthetic complications: no   No notable events documented.  Last Vitals:  Vitals:   03/06/22 1705 03/06/22 1720  BP: (!) 127/91 (!) 123/93  Pulse: 90 87  Resp: 14 13  Temp:  36.7 C  SpO2: 98% 99%    Last Pain:  Vitals:   03/06/22 1720  TempSrc:   PainSc: 0-No pain                 Keymoni Mccaster,W. EDMOND

## 2022-03-06 NOTE — Discharge Instructions (Signed)
D&E (Dilation and Evacuation) Dilation and evacuation (D&E) is a minor operation. It involves stretching (dilation) the cervix and evacuation of the uterus. During the procedure, the cervix is dilated and tissue is gently suctioned from the inside of the uterus.  REASONS FOR DOING D&E Removal of retained placenta after giving birth.  Abortion. Miscarriage.  RISKS AND COMPLICATIONS Putting a hole (perforation) in the uterus. Excessive bleeding after the D&E.  Infection of the uterus.  Damage to the cervix.  Developing scar tissue (adhesions) inside the uterus, later causing abnormal bleeding or no monthly bleeding (amenorrhea) or problems with fertility. Complications from general or local anesthetic.     PROCEDURE You may be given a drug to make you sleep (general anesthetic) or a drug that numbs the area (local anesthetic) in and around the cervix.  You will lie on your back with your legs in stirrups.  A curved tool (suction curette) will be used to evacuate the uterus and will then be removed.  This usually takes around 15 to 30 minutes.  AFTER THE PROCEDURE You will rest in the recovery room until you are stable and feel ready to go home.  You may feel sick to your stomach (nauseous) or throw up (vomit) if you had general anesthesia.  You may have light cramping and bleeding for a couple days to 2 weeks after the procedure.  Your uterus needs to make new lining after a D&E. This may make your next period late.   HOME CARE INSTRUCTIONS Do not drive for 24 hours.  Wait 1 week before returning to strenuous activities.  You may resume your usual diet.  Drink enough water and fluids to keep your urine clear or pale yellow.  You should return to your usual bowel function. If constipation occurs, you may:  Take a mild laxative with permission from your caregiver.  Add fruit and bran to your diet.  Take showers instead of baths for two weeks Do not go swimming or use a hot tub until  your caregiver gives you permission.  Have someone with you or available for you the first 24 to 48 hours, especially if you had a general anesthetic.  Do not douche, use tampons, or have intercourse until after your follow-up appointment, or when your caregiver approves.  Only take over-the-counter or prescription medicines for pain, discomfort, or fever as directed by your caregiver. Do not take aspirin. It can cause bleeding.  If a prescription has been given to you, follow your caregiver's directions. You may be given a medicine that kills germs (antibiotic) to prevent an infection.  Keep all your follow-up appointments recommended by your caregiver.   SEEK MEDICAL CARE IF: You have increasing cramps or pain not relieved with medicine.  You develop belly (abdominal) pain, which does not seem to be related to the same area as your earlier cramping and pain.  You feel dizzy or feel like fainting.  You have a bad smelling vaginal discharge.  You develop a rash.  You develop a reaction or allergy to your medicine.   SEEK IMMEDIATE MEDICAL CARE IF: Bleeding is heavier than a normal menstrual period.  You have an oral temperature above 101F, not controlled by medicine.  You develop chest pain.  You develop shortness of breath.  You pass out.  You develop heavy vaginal bleeding with or without blood clots.   MAKE SURE YOU: Understand these instructions.  Will watch your condition.  Will get help right away if you are   not doing well or get worse.   UPDATED HEALTH PRACTICES A Pap smear is done to screen for cervical cancer.  The first Pap smear should be done at age 21.  Between ages 21 and 29, Pap smears are repeated every 2 years.  Beginning at age 30, you are advised to have a Pap smear every 3 years as long as your past 3 Pap smears have been normal.  Some women have medical problems that increase the chance of getting cervical cancer. Talk to your caregiver about these problems. It  is especially important to talk to your caregiver if a new problem develops soon after your last Pap smear. In these cases, your caregiver may recommend more frequent screening and Pap smears.  The above recommendations are the same for women who have or have not gotten the vaccine for HPV (human papillomavirus).  If you had a uterus removal (hysterectomy) for a problem that was not a cancer or a condition that could lead to cancer, then you no longer need Pap smears.  If you are between ages 65 and 70, and you have had normal Pap smears going back 10 years, you no longer need Pap smears.  If you have had past treatment for cervical cancer or a condition that could lead to cancer, you need Pap smears and screening for cancer for at least 20 years after your treatment.  Continue monthly breast self-examinations. Your caregiver can provide information and instructions for breast self-examination.  ExitCare Patient Information 2011 [employer].  

## 2022-03-06 NOTE — Anesthesia Procedure Notes (Signed)
Procedure Name: LMA Insertion Date/Time: 03/06/2022 4:14 PM  Performed by: Lelon Perla, CRNAPre-anesthesia Checklist: Patient identified, Emergency Drugs available, Suction available and Patient being monitored Patient Re-evaluated:Patient Re-evaluated prior to induction Oxygen Delivery Method: Circle System Utilized Preoxygenation: Pre-oxygenation with 100% oxygen Induction Type: IV induction Ventilation: Mask ventilation without difficulty LMA: LMA inserted LMA Size: 4.0 Number of attempts: 1 Airway Equipment and Method: Bite block Placement Confirmation: positive ETCO2 Tube secured with: Tape Dental Injury: Teeth and Oropharynx as per pre-operative assessment

## 2022-03-06 NOTE — Op Note (Signed)
Julia Kline PROCEDURE DATE: 03/06/2022  PREOPERATIVE DIAGNOSIS: [redacted]w[redacted]d week missed abortion POSTOPERATIVE DIAGNOSIS: The same PROCEDURE:     Dilation and Evacuation SURGEON:  Dr. Nettie Elm  INDICATIONS: 36 y.o. [id number] with MAB at [redacted]w[redacted]d weeks gestation, needing surgical completion.  Risks of surgery were discussed with the patient including but not limited to: bleeding which may require transfusion; infection which may require antibiotics; injury to uterus or surrounding organs; need for additional procedures including laparotomy or laparoscopy; possibility of intrauterine scarring which may impair future fertility; and other postoperative/anesthesia complications. Written informed consent was obtained.    FINDINGS:  A 10 week size uterus, moderate amounts of products of conception, specimen sent to pathology.  ANESTHESIA: GETA INTRAVENOUS FLUIDS:  As recorded ESTIMATED BLOOD LOSS:  50 cc SPECIMENS:  Products of conception sent to pathology. Maternal blood and POC tissue samples for Anora genetic testing collected as well COMPLICATIONS:  None immediate.  PROCEDURE DETAILS:  The patient received intravenous Doxycycline while in the preoperative area.  She was then taken to the operating room where monitored intravenous sedation was administered and was found to be adequate.  After an adequate timeout was performed, she was placed in the dorsal lithotomy position and examined; then prepped and draped in the sterile manner.   Her bladder was catheterized for an unmeasured amount of clear, yellow urine. A vaginal speculum was then placed in the patient's vagina and a single tooth tenaculum was applied to the anterior lip of the cervix.  A paracervical block using 30 ml of 0.5% Marcaine was administered. The cervix was gently dilated to accommodate a 8 mm suction curette that was gently advanced to the uterine fundus.  The suction device was then activated and curette slowly rotated to clear  the uterus of products of conception.  A sharp curettage was then performed to confirm complete emptying of the uterus. There was minimal bleeding noted and the tenaculum removed with good hemostasis noted.   All instruments were removed from the patient's vagina.  Sponge and instrument counts were correct times two  The patient tolerated the procedure well and was taken to the recovery area awake, and in stable condition. 200 mcg of Cytotec was placed rectal at conclusion of the case  The patient will be discharged to home as per PACU criteria.  Routine postoperative instructions given.  She was prescribed Percocet, Ibuprofen and Colace.  She will follow up in the clinic on 3-4  for postoperative evaluation.   Nettie Elm, MD, FACOG Attending Obstetrician & Gynecologist Faculty Practice, [facility]

## 2022-03-06 NOTE — Anesthesia Preprocedure Evaluation (Addendum)
Anesthesia Evaluation  Patient identified by MRN, date of birth, ID band Patient awake    Reviewed: Allergy & Precautions, H&P , NPO status , Patient's Chart, lab work & pertinent test results  Airway Mallampati: III  TM Distance: >3 FB Neck ROM: Full    Dental no notable dental hx. (+) Teeth Intact, Dental Advisory Given   Pulmonary neg pulmonary ROS,    Pulmonary exam normal breath sounds clear to auscultation       Cardiovascular hypertension,  Rhythm:Regular Rate:Normal     Neuro/Psych  Headaches, negative psych ROS   GI/Hepatic negative GI ROS, Neg liver ROS,   Endo/Other  diabetes, Insulin DependentMorbid obesity  Renal/GU negative Renal ROS  negative genitourinary   Musculoskeletal   Abdominal   Peds  Hematology  (+) Blood dyscrasia, anemia ,   Anesthesia Other Findings   Reproductive/Obstetrics negative OB ROS                            Anesthesia Physical Anesthesia Plan  ASA: 3  Anesthesia Plan: General   Post-op Pain Management: Tylenol PO (pre-op)* and Toradol IV (intra-op)*   Induction: Intravenous  PONV Risk Score and Plan: 4 or greater and Ondansetron, Dexamethasone and Midazolam  Airway Management Planned: Oral ETT  Additional Equipment:   Intra-op Plan:   Post-operative Plan: Extubation in OR  Informed Consent: I have reviewed the patients History and Physical, chart, labs and discussed the procedure including the risks, benefits and alternatives for the proposed anesthesia with the patient or authorized representative who has indicated his/her understanding and acceptance.     Dental advisory given  Plan Discussed with: CRNA  Anesthesia Plan Comments:         Anesthesia Quick Evaluation

## 2022-03-07 ENCOUNTER — Ambulatory Visit: Payer: 59 | Admitting: Obstetrics and Gynecology

## 2022-03-07 ENCOUNTER — Encounter (HOSPITAL_COMMUNITY): Payer: Self-pay | Admitting: Obstetrics and Gynecology

## 2022-03-07 LAB — RPR: RPR Ser Ql: NONREACTIVE

## 2022-03-09 LAB — SURGICAL PATHOLOGY

## 2022-03-14 ENCOUNTER — Encounter: Payer: 59 | Admitting: Family Medicine

## 2022-03-15 ENCOUNTER — Encounter: Payer: Self-pay | Admitting: Primary Care

## 2022-03-15 ENCOUNTER — Ambulatory Visit: Payer: 59 | Admitting: Primary Care

## 2022-03-15 VITALS — BP 138/44 | HR 71 | Temp 98.6°F | Ht 69.0 in | Wt 257.0 lb

## 2022-03-15 DIAGNOSIS — E1165 Type 2 diabetes mellitus with hyperglycemia: Secondary | ICD-10-CM | POA: Diagnosis not present

## 2022-03-15 LAB — POCT GLYCOSYLATED HEMOGLOBIN (HGB A1C): Hemoglobin A1C: 8 % — AB (ref 4.0–5.6)

## 2022-03-15 MED ORDER — LANTUS SOLOSTAR 100 UNIT/ML ~~LOC~~ SOPN
18.0000 [IU] | PEN_INJECTOR | Freq: Every day | SUBCUTANEOUS | 1 refills | Status: DC
Start: 2022-03-15 — End: 2022-06-14

## 2022-03-15 NOTE — Progress Notes (Signed)
Subjective:    Patient ID: Julia Kline, female    DOB: 1986/01/31, 36 y.o.   MRN: [id number]  Diabetes Pertinent negatives for hypoglycemia include no dizziness. Pertinent negatives for diabetes include no chest pain.    Julia Kline is a very pleasant 36 y.o. female with a history of type 2 diabetes, recent D&E secondary to miscarriage, hypertension, migraines who presents today for follow up of diabetes.  Current medications include: Lantus 14 units daily, increased about 1 month.   She is checking her blood glucose continuously and is getting readings ranging mid 100's to mid 200's.   Last A1C: 8.4 in April 2023, 8.0 today Last Eye Exam: UTD Last Foot Exam: UTD Pneumonia Vaccination: Urine Microalbumin: UTD Statin: None.   Dietary changes since last visit: She has been watching her calories, eating low carb, mostly drinking water   Exercise: None.       Review of Systems  Respiratory:  Negative for shortness of breath.   Cardiovascular:  Negative for chest pain.  Neurological:  Negative for dizziness and numbness.         Past Medical History:  Diagnosis Date   Acute conjunctivitis of right eye 07/10/2019   Acute pain of right knee 08/06/2019   Anemia 10/14/2019   Complicated UTI (urinary tract infection) 03/10/2021   COVID 2020   had pneumonia   COVID-19    Diabetes mellitus without complication (HCC)    Eczema    Hernia cerebri (HCC)    Hidradenitis axillaris 04/20/2020   [facility]. Last seen 05/26/2020: instructed to wash b/l with Hibiclens and take Doxycycline as prescribed and follow up in one month.    History of COVID-19 12/21/2019   History of gestational diabetes 05/02/2017   Normal pp testing 10/2017   History of severe pre-eclampsia 05/02/2017   Hypertension    on no medications   Impingement syndrome of left shoulder 02/16/2020   knee   Mastitis 06/18/2019   Migraine    PCOS (polycystic ovarian  syndrome)    Pneumonia    Pregnancy induced hypertension    Skin lesions 09/12/2020    Social History   Socioeconomic History   Marital status: Married    Spouse name: Not on file   Number of children: Not on file   Years of education: Not on file   Highest education level: Not on file  Occupational History   Not on file  Tobacco Use   Smoking status: Never   Smokeless tobacco: Never  Vaping Use   Vaping Use: Never used  Substance and Sexual Activity   Alcohol use: No   Drug use: No   Sexual activity: Yes    Birth control/protection: None  Other Topics Concern   Not on file  Social History Narrative   Married.   2 children.   Works at [employer].   Social Determinants of Health   Financial Resource Strain: Not on file  Food Insecurity: Not on file  Transportation Needs: Not on file  Physical Activity: Not on file  Stress: Not on file  Social Connections: Not on file  Intimate Partner Violence: Not on file    Past Surgical History:  Procedure Laterality Date   CESAREAN SECTION  2017   Procedure: CESAREAN DELIVERY ONLY; Surgeon: Marian Sorrow, MD; Location: C[facility]CTION SU[facility]; Service: Obstetrics   CESAREAN SECTION N/A 10/02/2017   Procedure: CESAREAN SECTION;  Surgeon: Tilda Burrow, MD;  Location: [facility]  BIRTHING SUITES;  Service: Obstetrics;  Laterality: N/A;   DILATION AND EVACUATION N/A 03/06/2022   Procedure: DILATATION AND EVACUATION;  Surgeon: Hermina Staggers, MD;  Location: [facility] OR;  Service: Gynecology;  Laterality: N/A;   I & D EXTREMITY Left 10/14/2020   Procedure: Irrigation and debridement left small and ring finger with repair of tendon;  Surgeon: Dominica Severin, MD;  Location: [facility] OR;  Service: Orthopedics;  Laterality: Left;  1 hr Block with IV Sed   LAPAROSCOPIC OOPHERECTOMY Left    LEEP     OVARIAN CYST REMOVAL     TONSILLECTOMY     WISDOM TOOTH EXTRACTION      Family History  Problem Relation Age of Onset   Diabetes Mother     Migraines Mother    Diabetes Father    Hypertension Father    Hypertension Maternal Grandmother    Diabetes Maternal Grandmother    Diabetes Maternal Grandfather    COPD Maternal Grandfather    Diabetes Paternal Grandmother    Hypertension Paternal Grandmother    Diabetes Paternal Grandfather    Hypertension Paternal Grandfather    Heart attack Paternal Grandfather    Asthma Sister     Allergies  Allergen Reactions   Ancef [Cefazolin] Hives and Swelling    Facial and neck swelling; Had reaction after receiving fentanyl and ancef in the OR   Fentanyl Hives and Swelling    Facial and neck swelling; Had reaction after receiving fentanyl and ancef in the OR   Shrimp [Shellfish Allergy] Swelling and Other (See Comments)    Swelling of throat   Chlorhexidine Hives, Itching and Rash   Morphine And Related Rash   Pseudoephedrine Hcl Other (See Comments)    Light headed/ dizzy    Current Outpatient Medications on File Prior to Visit  Medication Sig Dispense Refill   Continuous Blood Gluc Sensor (FREESTYLE LIBRE 3 SENSOR) MISC Place 1 sensor on the skin every 14 days. Use to check glucose continuously. 6 each 3   ibuprofen (ADVIL) 800 MG tablet Take 1 tablet (800 mg total) by mouth every 8 (eight) hours as needed. 30 tablet 0   Insulin Pen Needle (PEN NEEDLES) 31G X 6 MM MISC Use nightly with insulin. 100 each 0   Prenatal Vit-Fe Fumarate-FA (MULTIVITAMIN-PRENATAL) 27-0.8 MG TABS tablet Take 1 tablet by mouth in the morning.     No current facility-administered medications on file prior to visit.    BP (!) 138/44   Pulse 71   Temp 98.6 F (37 C) (Oral)   Ht 5\' 9"  (1.753 m)   Wt 257 lb (116.6 kg)   LMP 12/18/2021   SpO2 100%   BMI 37.95 kg/m  Objective:   Physical Exam Cardiovascular:     Rate and Rhythm: Normal rate and regular rhythm.  Pulmonary:     Effort: Pulmonary effort is normal.     Breath sounds: Normal breath sounds.  Musculoskeletal:     Cervical back: Neck  supple.  Skin:    General: Skin is warm and dry.  Psychiatric:        Mood and Affect: Mood normal.           Assessment & Plan:   Problem List Items Addressed This Visit       Endocrine   Type 2 diabetes mellitus with hyperglycemia (HCC) - Primary    Improved but still uncontrolled with A1C of 8.0.   Increase Lantus to 18 units daily. If glucose readings consistently  remain above 150 after 2 weeks, then increase to 20 units daily. Keep working on diet and start exercising.   Foot and eye exam UTD. Not on statin given childbearing age.   Follow up in 3 months.       Relevant Medications   insulin glargine (LANTUS SOLOSTAR) 100 UNIT/ML Solostar Pen   Other Relevant Orders   POCT glycosylated hemoglobin (Hb A1C) (Completed)       Doreene Nest, NP

## 2022-03-15 NOTE — Assessment & Plan Note (Addendum)
Improved but still uncontrolled with A1C of 8.0.   Increase Lantus to 18 units daily. If glucose readings consistently remain above 150 after 2 weeks, then increase to 20 units daily. Keep working on diet and start exercising.   Foot and eye exam UTD. Not on statin given childbearing age.   Follow up in 3 months.

## 2022-03-15 NOTE — Patient Instructions (Signed)
We increased your dose of Lantus to 18 units daily.   Increase to 20 units daily after 2 weeks if your blood sugars are consistently above 150.   Keep working on [employer].  Start exercising.   Please schedule a follow up visit for 3 months for diabetes check.  It was a pleasure to see you today!

## 2022-03-28 ENCOUNTER — Other Ambulatory Visit (HOSPITAL_COMMUNITY)
Admission: RE | Admit: 2022-03-28 | Discharge: 2022-03-28 | Disposition: A | Discharge status: Home / Self Care | Payer: 59 | Source: Ambulatory Visit | Attending: Obstetrics and Gynecology | Admitting: Obstetrics and Gynecology

## 2022-03-28 ENCOUNTER — Encounter: Payer: Self-pay | Admitting: Family Medicine

## 2022-03-28 ENCOUNTER — Encounter: Payer: Self-pay | Admitting: *Deleted

## 2022-03-28 ENCOUNTER — Ambulatory Visit (INDEPENDENT_AMBULATORY_CARE_PROVIDER_SITE_OTHER): Payer: 59 | Admitting: Family Medicine

## 2022-03-28 VITALS — BP 137/95 | HR 108 | Wt 261.0 lb

## 2022-03-28 DIAGNOSIS — E1165 Type 2 diabetes mellitus with hyperglycemia: Secondary | ICD-10-CM

## 2022-03-28 DIAGNOSIS — Z3169 Encounter for other general counseling and advice on procreation: Secondary | ICD-10-CM

## 2022-03-28 DIAGNOSIS — O021 Missed abortion: Secondary | ICD-10-CM | POA: Diagnosis not present

## 2022-03-28 DIAGNOSIS — N898 Other specified noninflammatory disorders of vagina: Secondary | ICD-10-CM | POA: Diagnosis not present

## 2022-03-28 MED ORDER — NOVOLOG FLEXPEN 100 UNIT/ML ~~LOC~~ SOPN: 15.0000 [IU] | PEN_INJECTOR | Freq: Three times a day (TID) | SUBCUTANEOUS | 11 refills | Status: DC

## 2022-03-28 NOTE — Assessment & Plan Note (Signed)
Advised need for improved control for safe pregnancy--added short acting Novolog with meals. Does not tolerate metformin.

## 2022-03-28 NOTE — Progress Notes (Signed)
Patient presents for F/U SAB  Dilation and Evacuation on 03/06/22.   Pt denies any pain and bleeding at this time.  Pt notes vaginal irritation pt would like swab.

## 2022-03-28 NOTE — Patient Instructions (Signed)
Following an appropriate diet and keeping your blood sugar under control is the most important thing to do for your health.  Goals for Blood sugar should be: 1. Fasting (first thing in the morning before eating) should be less than 90.   2.  2 hours after meals should be less than 120.  Please eat 3 meals and 3 snacks.  Include protein (meat, dairy-cheese, eggs, nuts) with all meals.  Be mindful that carbohydrates increase your blood sugar.  Not just sweet food (cookies, cake, donuts, fruit, juice, soda) but also bread, pasta, rice, and potatoes.  You have to limit how many carbs you are eating.  Adding exercise, as little as 30 minutes a day can decrease your blood sugar.

## 2022-03-28 NOTE — Progress Notes (Signed)
   Subjective:    Patient ID: Julia Kline is a 36 y.o. female presenting with Follow-up and Miscarriage  on 03/28/2022  HPI: Here today for f/u after D & E for missed AB at 7 weeks. Had some bleeding prior to procedure. Attempt at [facility], did not have fetal cells so no diagnosis found. No further bleeding.  Last A1C is 8. Age is > 35. Reports vaginal discharge and irritation on 2 days ago.  Review of Systems  Constitutional:  Negative for chills and fever.  Respiratory:  Negative for shortness of breath.   Cardiovascular:  Negative for chest pain.  Gastrointestinal:  Negative for abdominal pain, nausea and vomiting.  Genitourinary:  Positive for vaginal discharge. Negative for dysuria.  Skin:  Negative for rash.      Objective:    BP (!) 137/95   Pulse (!) 108   Wt 261 lb (118.4 kg)   BMI 38.54 kg/m  Physical Exam Exam conducted with a chaperone present.  Constitutional:      General: She is not in acute distress.    Appearance: She is well-developed.  HENT:     Head: Normocephalic and atraumatic.  Eyes:     General: No scleral icterus. Cardiovascular:     Rate and Rhythm: Normal rate.  Pulmonary:     Effort: Pulmonary effort is normal.  Abdominal:     Palpations: Abdomen is soft.  Musculoskeletal:     Cervical back: Neck supple.  Skin:    General: Skin is warm and dry.  Neurological:     Mental Status: She is alert and oriented to person, place, and time.         Assessment & Plan:   Problem List Items Addressed This Visit       Unprioritized   Type 2 diabetes mellitus with hyperglycemia (HCC)    Advised need for improved control for safe pregnancy--added short acting Novolog with meals. Does not tolerate metformin.      Relevant Medications   insulin aspart (NOVOLOG FLEXPEN) 100 UNIT/ML FlexPen   Other Visit Diagnoses     Vaginal irritation    -  Primary   check wet prep and treat accordingly.   Relevant Orders   Cervicovaginal ancillary  only(  [place])   Missed abortion       doing well, usual causes reviewed, may try again as she is ready. Grief discussed.   Encounter for preconception consultation       discussed age, diabetic control, continue PNVs.        Return if symptoms worsen or fail to improve.  Reva Bores, MD 03/28/2022 4:17 PM

## 2022-03-29 ENCOUNTER — Other Ambulatory Visit: Payer: Self-pay | Admitting: *Deleted

## 2022-03-29 MED ORDER — INSULIN LISPRO (1 UNIT DIAL) 100 UNIT/ML (KWIKPEN): 15.0000 [IU] | PEN_INJECTOR | Freq: Three times a day (TID) | SUBCUTANEOUS | 11 refills | Status: DC

## 2022-03-30 LAB — CERVICOVAGINAL ANCILLARY ONLY
Bacterial Vaginitis (gardnerella): NEGATIVE
Candida Glabrata: NEGATIVE
Candida Vaginitis: NEGATIVE
Comment: NEGATIVE
Comment: NEGATIVE
Comment: NEGATIVE

## 2022-04-10 ENCOUNTER — Other Ambulatory Visit: Payer: Self-pay | Admitting: Primary Care

## 2022-04-10 DIAGNOSIS — E1165 Type 2 diabetes mellitus with hyperglycemia: Secondary | ICD-10-CM

## 2022-06-08 ENCOUNTER — Ambulatory Visit: Payer: 59 | Admitting: Primary Care

## 2022-06-14 ENCOUNTER — Encounter: Payer: Self-pay | Admitting: Primary Care

## 2022-06-14 ENCOUNTER — Ambulatory Visit (INDEPENDENT_AMBULATORY_CARE_PROVIDER_SITE_OTHER): Payer: 59 | Admitting: Primary Care

## 2022-06-14 VITALS — BP 128/88 | HR 85 | Temp 98.1°F | Ht 69.0 in | Wt 263.0 lb

## 2022-06-14 DIAGNOSIS — Z6838 Body mass index (BMI) 38.0-38.9, adult: Secondary | ICD-10-CM

## 2022-06-14 DIAGNOSIS — E1165 Type 2 diabetes mellitus with hyperglycemia: Secondary | ICD-10-CM | POA: Diagnosis not present

## 2022-06-14 DIAGNOSIS — Z6827 Body mass index (BMI) 27.0-27.9, adult: Secondary | ICD-10-CM | POA: Insufficient documentation

## 2022-06-14 LAB — POCT GLYCOSYLATED HEMOGLOBIN (HGB A1C): Hemoglobin A1C: 8.3 % — AB (ref 4.0–5.6)

## 2022-06-14 LAB — MICROALBUMIN / CREATININE URINE RATIO
Creatinine,U: 80.6 mg/dL
Microalb Creat Ratio: 8.4 mg/g (ref 0.0–30.0)
Microalb, Ur: 6.8 mg/dL — ABNORMAL HIGH (ref 0.0–1.9)

## 2022-06-14 MED ORDER — LANTUS SOLOSTAR 100 UNIT/ML ~~LOC~~ SOPN
25.0000 [IU] | PEN_INJECTOR | Freq: Every day | SUBCUTANEOUS | 0 refills | Status: DC
Start: 2022-06-14 — End: 2022-10-17

## 2022-06-14 NOTE — Progress Notes (Signed)
Subjective:    Patient ID: Julia Kline, female    DOB: [birth year], 36 y.o.   MRN: [id number]  HPI  Julia Kline is a very pleasant 36 y.o. female with a history of type 2 diabetes, hypertension, pre-eclampsia in pregnancy who presents today for follow up of diabetes.   Current medications include: Lantus 20 units daily, Humalog 15 units TID. Rapid acting insulin added by OB in August 2023.   She is checking her blood glucose continuously times daily and is getting readings of: 170's to low 200's. Average for the last 30 days was 194.  Last A1C: 8.0 in July 2023, 8.3 today Last Eye Exam: UTD Last Foot Exam: Due Pneumonia Vaccination: Never completed Urine Microalbumin: UTD Statin: None. Child bearing age.  Dietary changes since last visit: Reduction of sodas, increased consumption of veggies/fruits, making better choices when eating out.   She would like a referral to bariatric surgeon    Exercise: None.   BP Readings from Last 3 Encounters:  06/14/22 128/88  03/28/22 (!) 137/95  03/15/22 (!) 138/44   Wt Readings from Last 3 Encounters:  06/14/22 263 lb (119.3 kg)  03/28/22 261 lb (118.4 kg)  03/15/22 257 lb (116.6 kg)       Review of Systems  Eyes:  Negative for visual disturbance.  Cardiovascular:  Negative for chest pain.  Gastrointestinal:  Negative for nausea.  Endocrine: Positive for polydipsia and polyphagia. Negative for polyuria.  Neurological:  Positive for headaches.         Past Medical History:  Diagnosis Date   Acute conjunctivitis of right eye 07/10/2019   Acute pain of right knee 08/06/2019   Anemia 25/85/2778   Complicated UTI (urinary tract infection) 03/10/2021   COVID 2020   had pneumonia   COVID-19    Diabetes mellitus without complication ([place])    Eczema    Hernia cerebri ([place])    Hidradenitis axillaris 04/20/2020   Followed by [place] [facility]. Last seen 05/26/2020: instructed to wash b/l with Hibiclens  and take Doxycycline as prescribed and follow up in one month.    History of COVID-19 12/21/2019   History of gestational diabetes 05/02/2017   Normal pp testing 10/2017   History of severe pre-eclampsia 05/02/2017   Hypertension    on no medications   Impingement syndrome of left shoulder 02/16/2020   knee   Mastitis 06/18/2019   Migraine    PCOS (polycystic ovarian syndrome)    Pneumonia    Pregnancy induced hypertension    Skin lesions 09/12/2020    Social History   Socioeconomic History   Marital status: Married    Spouse name: Not on file   Number of children: Not on file   Years of education: Not on file   Highest education level: Not on file  Occupational History   Not on file  Tobacco Use   Smoking status: Never   Smokeless tobacco: Never  Vaping Use   Vaping Use: Never used  Substance and Sexual Activity   Alcohol use: No   Drug use: No   Sexual activity: Yes    Birth control/protection: None  Other Topics Concern   Not on file  Social History Narrative   Married.   2 children.   Works at [employer].   Social Determinants of Health   Financial Resource Strain: Not on file  Food Insecurity: Not on file  Transportation Needs: Not on file  Physical Activity: Not on file  Stress: Not on file  Social Connections: Not on file  Intimate Partner Violence: Not on file    Past Surgical History:  Procedure Laterality Date   CESAREAN SECTION  2017   Procedure: CESAREAN DELIVERY ONLY; Surgeon: Marian Sorrow, MD; Location: C-SECTION SUITE [facility]; Service: Obstetrics   CESAREAN SECTION N/A 10/02/2017   Procedure: CESAREAN SECTION;  Surgeon: Tilda Burrow, MD;  Location: [facility] BIRTHING SUITES;  Service: Obstetrics;  Laterality: N/A;   DILATION AND EVACUATION N/A 03/06/2022   Procedure: DILATATION AND EVACUATION;  Surgeon: Hermina Staggers, MD;  Location: [facility] OR;  Service: Gynecology;  Laterality: N/A;   I & D EXTREMITY Left 10/14/2020   Procedure:  Irrigation and debridement left small and ring finger with repair of tendon;  Surgeon: Dominica Severin, MD;  Location: [facility] OR;  Service: Orthopedics;  Laterality: Left;  1 hr Block with IV Sed   LAPAROSCOPIC OOPHERECTOMY Left    LEEP     OVARIAN CYST REMOVAL     TONSILLECTOMY     WISDOM TOOTH EXTRACTION      Family History  Problem Relation Age of Onset   Diabetes Mother    Migraines Mother    Diabetes Father    Hypertension Father    Hypertension Maternal Grandmother    Diabetes Maternal Grandmother    Diabetes Maternal Grandfather    COPD Maternal Grandfather    Diabetes Paternal Grandmother    Hypertension Paternal Grandmother    Diabetes Paternal Grandfather    Hypertension Paternal Grandfather    Heart attack Paternal Grandfather    Asthma Sister     Allergies  Allergen Reactions   Ancef [Cefazolin] Hives and Swelling    Facial and neck swelling; Had reaction after receiving fentanyl and ancef in the OR   Fentanyl Hives and Swelling    Facial and neck swelling; Had reaction after receiving fentanyl and ancef in the OR   Shrimp [Shellfish Allergy] Swelling and Other (See Comments)    Swelling of throat   Chlorhexidine Hives, Itching and Rash   Morphine And Related Rash   Pseudoephedrine Hcl Other (See Comments)    Light headed/ dizzy    Current Outpatient Medications on File Prior to Visit  Medication Sig Dispense Refill   Continuous Blood Gluc Sensor (FREESTYLE LIBRE 3 SENSOR) MISC Place 1 sensor on the skin every 14 days. Use to check glucose continuously. 6 each 3   ibuprofen (ADVIL) 800 MG tablet Take 1 tablet (800 mg total) by mouth every 8 (eight) hours as needed. 30 tablet 0   insulin lispro (HUMALOG KWIKPEN) 100 UNIT/ML KwikPen Inject 15 Units into the skin with breakfast, with lunch, and with evening meal. 15 mL 11   Insulin Pen Needle (TECHLITE PEN NEEDLES) 32G X 4 MM MISC USE NIGHTLY WITH INSULIN 100 each 1   Prenatal Vit-Fe Fumarate-FA  (MULTIVITAMIN-PRENATAL) 27-0.8 MG TABS tablet Take 1 tablet by mouth in the morning.     No current facility-administered medications on file prior to visit.    BP 128/88   Pulse 85   Temp 98.1 F (36.7 C) (Temporal)   Ht 5\' 9"  (1.753 m)   Wt 263 lb (119.3 kg)   LMP 06/08/2022 (Exact Date)   SpO2 98%   Breastfeeding No   BMI 38.84 kg/m  Objective:   Physical Exam Cardiovascular:     Rate and Rhythm: Normal rate and regular rhythm.  Pulmonary:     Effort: Pulmonary effort is normal.  Breath sounds: Normal breath sounds.  Musculoskeletal:     Cervical back: Neck supple.  Skin:    General: Skin is warm and dry.           Assessment & Plan:   Problem List Items Addressed This Visit       Endocrine   Type 2 diabetes mellitus with hyperglycemia (HCC) - Primary    Uncontrolled, with hemoglobin A1C reading of 8.3.   Will increase Lantus to 25 units. If glucose readings consistently remain above 150 after 2 weeks, she will update.    Keep working on diet and start exercising. Declined nutrition referral today.    Foot exam completed today. Urine ACR pending.  Not on statin given childbearing age.    Follow up in 3 months.   I evaluated patient, was consulted regarding treatment, and agree with assessment and plan per Modesto Charon, RN, DNP student.   Mayra Reel, NP-C       Relevant Medications   insulin glargine (LANTUS SOLOSTAR) 100 UNIT/ML Solostar Pen   Other Relevant Orders   Microalbumin/Creatinine Ratio, Urine   POCT glycosylated hemoglobin (Hb A1C) (Completed)     Other   Class 2 severe obesity due to excess calories with serious comorbidity and body mass index (BMI) of 38.0 to 38.9 in adult [facility])    Discussed diet and exercise.   She may not qualify for bariatric surgery at this time due to her BMI. She will like to do some research about the surgery and she will update.   I evaluated patient, was consulted regarding treatment, and agree with  assessment and plan per Modesto Charon, RN, DNP student.   Mayra Reel, NP-C       Relevant Medications   insulin glargine (LANTUS SOLOSTAR) 100 UNIT/ML Solostar Pen       Doreene Nest, NP

## 2022-06-14 NOTE — Assessment & Plan Note (Addendum)
Discussed diet and exercise.   She may not qualify for bariatric surgery at this time due to her BMI. She will like to do some research about the surgery and she will update.   I evaluated patient, was consulted regarding treatment, and agree with assessment and plan per Tinnie Gens, RN, DNP student.   Allie Bossier, NP-C

## 2022-06-14 NOTE — Assessment & Plan Note (Addendum)
Uncontrolled, with hemoglobin A1C reading of 8.3.   Will increase Lantus to 25 units. If glucose readings consistently remain above 150 after 2 weeks, she will update.    Keep working on diet and start exercising. Declined nutrition referral today.   Foot exam completed today. Urine ACR pending.  Not on statin given childbearing age.   Follow up in 3 months.   I evaluated patient, was consulted regarding treatment, and agree with assessment and plan per Tinnie Gens, RN, DNP student.   Allie Bossier, NP-C

## 2022-06-14 NOTE — Patient Instructions (Addendum)
Stop by the lab prior to leaving today. I will notify you of your results once received.   Increase Lantus (long acting insulin) to 25 units daily.   Please update her in two weeks or sooner if you are seeing readings below 70 or over 300.   It is important that you improve your diet. Please limit carbohydrates in the form of white bread, rice, pasta, sweets, fast food, fried food, sugary drinks, etc. Increase your consumption of fresh fruits and vegetables, whole grains, lean protein.  Ensure you are consuming 64 ounces of water daily.   Follow up in three months.   It was a pleasure to see you today!

## 2022-06-14 NOTE — Progress Notes (Signed)
Established Patient Office Visit  Subjective   Patient ID: Julia Kline, female    DOB: Feb 14, 1986  Age: 36 y.o. MRN: [id number]  Chief Complaint  Patient presents with   Follow-up    HPI  Julia Kline is a 36 year old female with past medical history of hypertension, migraines, type 2 diabetes, who presents today for a diabetes follow up.   Current medications include: Lantus 20 units daily. Insulin Lispro 15 units TID with meals.   She has been using continuous blood glucose monitoring device, Free style libre, and is getting readings of 170s to low 200s.   Last A1C: 8.0 in July, 2023.  Last Eye Exam: UTD Last Foot Exam: Due  Pneumonia Vaccination: declines Urine Microalbumin: Due Statin: none.   Dietary changes since last visit: Avoids soft drinks. Likes to eat a lot fruits and vegetables. Usually will eat subway or [place] [employer] for lunch.   Exercise: None.   She is interested in bariatric surgery for weight loss. She has been having a hard time with finding time to exercise and has seen positive results in her friends.   Patient Active Problem List   Diagnosis Date Noted   Class 2 severe obesity due to excess calories with serious comorbidity and body mass index (BMI) of 38.0 to 38.9 in adult [facility]) 06/14/2022   Ovalocytosis (HCC) 03/03/2022   History of pre-eclampsia in prior pregnancy, currently pregnant 02/15/2022   Type 2 diabetes mellitus with hyperglycemia (HCC) 12/21/2019   Hypertension 10/13/2019   Migraines 04/01/2018   Past Medical History:  Diagnosis Date   Acute conjunctivitis of right eye 07/10/2019   Acute pain of right knee 08/06/2019   Anemia 10/14/2019   Complicated UTI (urinary tract infection) 03/10/2021   COVID 2020   had pneumonia   COVID-19    Diabetes mellitus without complication (HCC)    Eczema    Hernia cerebri (HCC)    Hidradenitis axillaris 04/20/2020   Followed by [facility] [place]. Last seen 05/26/2020:  instructed to wash b/l with Hibiclens and take Doxycycline as prescribed and follow up in one month.    History of COVID-19 12/21/2019   History of gestational diabetes 05/02/2017   Normal pp testing 10/2017   History of severe pre-eclampsia 05/02/2017   Hypertension    on no medications   Impingement syndrome of left shoulder 02/16/2020   knee   Mastitis 06/18/2019   Migraine    PCOS (polycystic ovarian syndrome)    Pneumonia    Pregnancy induced hypertension    Skin lesions 09/12/2020   Past Surgical History:  Procedure Laterality Date   CESAREAN SECTION  2017   Procedure: CESAREAN DELIVERY ONLY; Surgeon: Marian Sorrow, MD; Location: C-SECTION SUITE [facility]; Service: Obstetrics   CESAREAN SECTION N/A 10/02/2017   Procedure: CESAREAN SECTION;  Surgeon: Tilda Burrow, MD;  Location: [facility] BIRTHING SUITES;  Service: Obstetrics;  Laterality: N/A;   DILATION AND EVACUATION N/A 03/06/2022   Procedure: DILATATION AND EVACUATION;  Surgeon: Hermina Staggers, MD;  Location: [facility] OR;  Service: Gynecology;  Laterality: N/A;   I & D EXTREMITY Left 10/14/2020   Procedure: Irrigation and debridement left small and ring finger with repair of tendon;  Surgeon: Dominica Severin, MD;  Location: [facility] OR;  Service: Orthopedics;  Laterality: Left;  1 hr Block with IV Sed   LAPAROSCOPIC OOPHERECTOMY Left    LEEP     OVARIAN CYST REMOVAL     TONSILLECTOMY  WISDOM TOOTH EXTRACTION     Social History   Tobacco Use   Smoking status: Never   Smokeless tobacco: Never  Vaping Use   Vaping Use: Never used  Substance Use Topics   Alcohol use: No   Drug use: No   Family Status  Relation Name Status   Mother  Alive   Father  Alive   MGM  Alive   Sister  Alive   Daughter  Alive   Son  Alive   MGF  Deceased   PGM  Deceased   PGF  Deceased   Sister  Alive   Allergies  Allergen Reactions   Ancef [Cefazolin] Hives and Swelling    Facial and neck swelling; Had reaction after receiving fentanyl and  ancef in the OR   Fentanyl Hives and Swelling    Facial and neck swelling; Had reaction after receiving fentanyl and ancef in the OR   Shrimp [Shellfish Allergy] Swelling and Other (See Comments)    Swelling of throat   Chlorhexidine Hives, Itching and Rash   Morphine And Related Rash   Pseudoephedrine Hcl Other (See Comments)    Light headed/ dizzy      Review of Systems  Eyes:  Negative for blurred vision.  Respiratory:  Negative for shortness of breath.   Cardiovascular:  Negative for chest pain.  Gastrointestinal:  Negative for nausea and vomiting.  Genitourinary:  Negative for frequency and urgency.  Neurological:  Positive for headaches.  Endo/Heme/Allergies:  Positive for polydipsia.       Polyphagia.       Objective:     BP 128/88   Pulse 85   Temp 98.1 F (36.7 C) (Temporal)   Ht 5\' 9"  (1.753 m)   Wt 263 lb (119.3 kg)   LMP 06/08/2022 (Exact Date)   SpO2 98%   Breastfeeding No   BMI 38.84 kg/m  BP Readings from Last 3 Encounters:  06/14/22 128/88  03/28/22 (!) 137/95  03/15/22 (!) 138/44   Wt Readings from Last 3 Encounters:  06/14/22 263 lb (119.3 kg)  03/28/22 261 lb (118.4 kg)  03/15/22 257 lb (116.6 kg)      Physical Exam Vitals reviewed.  Cardiovascular:     Rate and Rhythm: Normal rate and regular rhythm.     Pulses: Normal pulses.     Heart sounds: Normal heart sounds.  Pulmonary:     Effort: Pulmonary effort is normal.     Breath sounds: Normal breath sounds.  Neurological:     Mental Status: She is oriented to person, place, and time.  Psychiatric:        Mood and Affect: Mood normal.        Behavior: Behavior normal.      Results for orders placed or performed in visit on 06/14/22  POCT glycosylated hemoglobin (Hb A1C)  Result Value Ref Range   Hemoglobin A1C 8.3 (A) 4.0 - 5.6 %   HbA1c POC (<> result, manual entry)     HbA1c, POC (prediabetic range)     HbA1c, POC (controlled diabetic range)      Last hemoglobin A1c Lab  Results  Component Value Date   HGBA1C 8.3 (A) 06/14/2022      The ASCVD Risk score (Arnett DK, et al., 2019) failed to calculate for the following reasons:   The 2019 ASCVD risk score is only valid for ages 5 to 13    Assessment & Plan:   Problem List Items Addressed This Visit  Endocrine   Type 2 diabetes mellitus with hyperglycemia (HCC) - Primary    Uncontrolled, with hemoglobin A1C reading of 8.3.   Will increase Lantus to 25 units. If glucose readings consistently remain above 150 after 2 weeks, she will update.    Keep working on diet and start exercising. Declined nutrition referral today.    Foot exam completed today. Urine ACR pending.  Not on statin given childbearing age.    Follow up in 3 months.       Relevant Medications   insulin glargine (LANTUS SOLOSTAR) 100 UNIT/ML Solostar Pen   Other Relevant Orders   Microalbumin/Creatinine Ratio, Urine   POCT glycosylated hemoglobin (Hb A1C) (Completed)     Other   Class 2 severe obesity due to excess calories with serious comorbidity and body mass index (BMI) of 38.0 to 38.9 in adult [facility])    Discussed diet and exercise.   She would not qualify for bariatric surgery at this time due to her BMI. She will like to do some research about the surgery and she will update.       Relevant Medications   insulin glargine (LANTUS SOLOSTAR) 100 UNIT/ML Solostar Pen    No follow-ups on file.    Tinnie Gens, BSN-RN, DNP STUDENT

## 2022-06-15 ENCOUNTER — Ambulatory Visit: Payer: 59 | Admitting: Primary Care

## 2022-06-25 ENCOUNTER — Encounter (HOSPITAL_COMMUNITY): Payer: Self-pay

## 2022-06-25 ENCOUNTER — Emergency Department (HOSPITAL_COMMUNITY): Payer: 59

## 2022-06-25 ENCOUNTER — Emergency Department (HOSPITAL_COMMUNITY)
Admission: EM | Admit: 2022-06-25 | Discharge: 2022-06-25 | Disposition: A | Discharge status: Home / Self Care | Payer: 59 | Attending: Emergency Medicine | Admitting: Emergency Medicine

## 2022-06-25 ENCOUNTER — Ambulatory Visit
Admission: EM | Admit: 2022-06-25 | Discharge: 2022-06-25 | Disposition: A | Discharge status: Home / Self Care | Payer: 59

## 2022-06-25 ENCOUNTER — Other Ambulatory Visit: Payer: Self-pay

## 2022-06-25 DIAGNOSIS — K6289 Other specified diseases of anus and rectum: Secondary | ICD-10-CM | POA: Diagnosis not present

## 2022-06-25 DIAGNOSIS — Z794 Long term (current) use of insulin: Secondary | ICD-10-CM | POA: Insufficient documentation

## 2022-06-25 DIAGNOSIS — K610 Anal abscess: Secondary | ICD-10-CM | POA: Diagnosis present

## 2022-06-25 DIAGNOSIS — K611 Rectal abscess: Secondary | ICD-10-CM

## 2022-06-25 DIAGNOSIS — I10 Essential (primary) hypertension: Secondary | ICD-10-CM | POA: Diagnosis not present

## 2022-06-25 DIAGNOSIS — E119 Type 2 diabetes mellitus without complications: Secondary | ICD-10-CM | POA: Diagnosis not present

## 2022-06-25 LAB — CBC WITH DIFFERENTIAL/PLATELET
Abs Immature Granulocytes: 0.02 10*3/uL (ref 0.00–0.07)
Basophils Absolute: 0.1 10*3/uL (ref 0.0–0.1)
Basophils Relative: 1 %
Eosinophils Absolute: 0.1 10*3/uL (ref 0.0–0.5)
Eosinophils Relative: 1 %
HCT: 34.2 % — ABNORMAL LOW (ref 36.0–46.0)
Hemoglobin: 10.3 g/dL — ABNORMAL LOW (ref 12.0–15.0)
Immature Granulocytes: 0 %
Lymphocytes Relative: 28 %
Lymphs Abs: 2.4 10*3/uL (ref 0.7–4.0)
MCH: 21.5 pg — ABNORMAL LOW (ref 26.0–34.0)
MCHC: 30.1 g/dL (ref 30.0–36.0)
MCV: 71.4 fL — ABNORMAL LOW (ref 80.0–100.0)
Monocytes Absolute: 0.8 10*3/uL (ref 0.1–1.0)
Monocytes Relative: 9 %
Neutro Abs: 5.3 10*3/uL (ref 1.7–7.7)
Neutrophils Relative %: 61 %
Platelets: 234 10*3/uL (ref 150–400)
RBC: 4.79 MIL/uL (ref 3.87–5.11)
RDW: 15 % (ref 11.5–15.5)
WBC: 8.6 10*3/uL (ref 4.0–10.5)
nRBC: 0 % (ref 0.0–0.2)

## 2022-06-25 LAB — HCG, SERUM, QUALITATIVE: Preg, Serum: NEGATIVE

## 2022-06-25 LAB — BASIC METABOLIC PANEL
Anion gap: 9 (ref 5–15)
BUN: 11 mg/dL (ref 6–20)
CO2: 24 mmol/L (ref 22–32)
Calcium: 8.7 mg/dL — ABNORMAL LOW (ref 8.9–10.3)
Chloride: 103 mmol/L (ref 98–111)
Creatinine, Ser: 0.62 mg/dL (ref 0.44–1.00)
GFR, Estimated: >60 mL/min (ref >60)
Glucose, Bld: 166 mg/dL — ABNORMAL HIGH (ref 70–99)
Potassium: 4 mmol/L (ref 3.5–5.1)
Sodium: 136 mmol/L (ref 135–145)

## 2022-06-25 LAB — LACTIC ACID, PLASMA: Lactic Acid, Venous: 1.8 mmol/L (ref 0.5–1.9)

## 2022-06-25 MED ORDER — METRONIDAZOLE 500 MG PO TABS: 500.0000 mg | ORAL_TABLET | Freq: Two times a day (BID) | ORAL | 0 refills | Status: DC

## 2022-06-25 MED ORDER — OXYCODONE-ACETAMINOPHEN 5-325 MG PO TABS: 1.0000 | ORAL_TABLET | Freq: Four times a day (QID) | ORAL | 0 refills | Status: DC | PRN

## 2022-06-25 MED ORDER — CIPROFLOXACIN HCL 250 MG PO TABS
500.0000 mg | ORAL_TABLET | Freq: Once | ORAL | Status: AC
  Administered 2022-06-25: 500 mg via ORAL
  Filled 2022-06-25: qty 2

## 2022-06-25 MED ORDER — IOHEXOL 300 MG/ML  SOLN
100.0000 mL | Freq: Once | INTRAMUSCULAR | Status: AC | PRN
  Administered 2022-06-25: 100 mL via INTRAVENOUS

## 2022-06-25 MED ORDER — METRONIDAZOLE 500 MG PO TABS
500.0000 mg | ORAL_TABLET | Freq: Once | ORAL | Status: AC
  Administered 2022-06-25: 500 mg via ORAL
  Filled 2022-06-25: qty 1

## 2022-06-25 MED ORDER — ONDANSETRON 4 MG PO TBDP
4.0000 mg | ORAL_TABLET | Freq: Once | ORAL | Status: AC
  Administered 2022-06-25: 4 mg via ORAL
  Filled 2022-06-25: qty 1

## 2022-06-25 MED ORDER — OXYCODONE HCL 5 MG PO TABS
5.0000 mg | ORAL_TABLET | Freq: Once | ORAL | Status: AC
  Administered 2022-06-25: 5 mg via ORAL
  Filled 2022-06-25: qty 1

## 2022-06-25 MED ORDER — ONDANSETRON HCL 4 MG PO TABS: 4.0000 mg | ORAL_TABLET | Freq: Four times a day (QID) | ORAL | 0 refills | Status: DC

## 2022-06-25 MED ORDER — CIPROFLOXACIN HCL 500 MG PO TABS
500.0000 mg | ORAL_TABLET | Freq: Two times a day (BID) | ORAL | 0 refills | Status: DC
Start: 2022-06-25 — End: 2022-07-27

## 2022-06-25 NOTE — Discharge Instructions (Addendum)
We discussed pros and cons of draining your abscess in the department today.  You and your mother decided you would prefer to see the specialist outpatient.  As we discussed, antibiotics and pain control are temporary measures.  Ultimately this area will likely require drainage.  If you are unable to get in with surgery and continue to have pain or develop fevers, chills, night sweats or any other concerning symptoms you need to return to an emergency department for further evaluation and likely drainage.  I have sent the following medications to your pharmacy Ciprofloxacin.  This is an antibiotic to treat your infection.  This also commonly causes severe diarrhea and vomiting so please take it with food Metronidazole.  This is another antibiotic for your infection.  This also can cause GI upset so also take this with food Zofran.  This is a medication for nausea that you may use if you become nauseated from the antibiotic Percocet.  This is a stronger pain medication that may help relieve your pain while in the infection clears up.  Do not drive or drink alcohol on this medication.  You also should not drink alcohol on any of the antibiotics.  Keep your appointment with internal medicine tomorrow morning.  They also may be able to get you in with surgery if they do not want to drain the area.

## 2022-06-25 NOTE — ED Provider Notes (Signed)
[facility]    CSN: [id number] Arrival date & time: 06/25/22  1847      History   Chief Complaint Chief Complaint  Patient presents with   Abscess    HPI Julia Kline is a 36 y.o. female.   Patient presents today with a 1 week history of enlarging abscess on her right buttocks.  She has a history of recurrent abscesses but has not had to have anything drained in the past several years.  Denies any recent antibiotics.  Reports this is gradually been worsening and is currently very painful.  Pain is rated 10 on a 0-10 pain scale, described as throbbing, no aggravating relieving factors identified.  She has tried warm compresses without improvement of symptoms.  She has a history of diabetes and reports that her blood sugars are not adequately controlled.  They have been higher in the past several days since this infection started.  She denies history of MRSA.  She is confident that she is not pregnant.  Denies any fever, nausea, vomiting, weakness.    Past Medical History:  Diagnosis Date   Acute conjunctivitis of right eye 07/10/2019   Acute pain of right knee 08/06/2019   Anemia 23/53/6144   Complicated UTI (urinary tract infection) 03/10/2021   COVID 2020   had pneumonia   COVID-19    Diabetes mellitus without complication ([place])    Eczema    Hernia cerebri ([place])    Hidradenitis axillaris 04/20/2020   Followed by [place] surgery. Last seen 05/26/2020: instructed to wash b/l with Hibiclens and take Doxycycline as prescribed and follow up in one month.    History of COVID-19 12/21/2019   History of gestational diabetes 05/02/2017   Normal pp testing 10/2017   History of severe pre-eclampsia 05/02/2017   Hypertension    on no medications   Impingement syndrome of left shoulder 02/16/2020   knee   Mastitis 06/18/2019   Migraine    PCOS (polycystic ovarian syndrome)    Pneumonia    Pregnancy induced hypertension    Skin lesions 09/12/2020     Patient Active Problem List   Diagnosis Date Noted   Class 2 severe obesity due to excess calories with serious comorbidity and body mass index (BMI) of 38.0 to 38.9 in adult [facility]) 06/14/2022   Ovalocytosis ([place]) 03/03/2022   History of pre-eclampsia in prior pregnancy, currently pregnant 02/15/2022   Type 2 diabetes mellitus with hyperglycemia ([place]) 12/21/2019   Hypertension 10/13/2019   Migraines 04/01/2018    Past Surgical History:  Procedure Laterality Date   CESAREAN SECTION  2017   Procedure: CESAREAN DELIVERY ONLY; Surgeon: Lina Sar, MD; Location: [facility]; Service: Obstetrics   CESAREAN SECTION N/A 10/02/2017   Procedure: CESAREAN SECTION;  Surgeon: Jonnie Kind, MD;  Location: [place];  Service: Obstetrics;  Laterality: N/A;   DILATION AND EVACUATION N/A 03/06/2022   Procedure: DILATATION AND EVACUATION;  Surgeon: Chancy Milroy, MD;  Location: [place];  Service: Gynecology;  Laterality: N/A;   I & D EXTREMITY Left 10/14/2020   Procedure: Irrigation and debridement left small and ring finger with repair of tendon;  Surgeon: Roseanne Kaufman, MD;  Location: [place];  Service: Orthopedics;  Laterality: Left;  1 hr Block with IV Sed   LAPAROSCOPIC OOPHERECTOMY Left    LEEP     OVARIAN CYST REMOVAL     TONSILLECTOMY     WISDOM TOOTH EXTRACTION      OB  History     Gravida  3   Para  2   Term  2   Preterm  0   AB  0   Living  2      SAB  0   IAB  0   Ectopic  0   Multiple  0   Live Births  2            Home Medications    Prior to Admission medications   Medication Sig Start Date End Date Taking? Authorizing Provider  Continuous Blood Gluc Sensor (FREESTYLE LIBRE 3 SENSOR) MISC Place 1 sensor on the skin every 14 days. Use to check glucose continuously. 01/25/22   Pleas Koch, NP  ibuprofen (ADVIL) 800 MG tablet Take 1 tablet (800 mg total) by mouth every 8 (eight) hours as needed. 03/06/22   Chancy Milroy, MD  insulin glargine (LANTUS SOLOSTAR) 100 UNIT/ML Solostar Pen Inject 25 Units into the skin daily. for diabetes. 06/14/22   Pleas Koch, NP  insulin lispro (HUMALOG KWIKPEN) 100 UNIT/ML KwikPen Inject 15 Units into the skin with breakfast, with lunch, and with evening meal. 03/29/22   Donnamae Jude, MD  Insulin Pen Needle (TECHLITE PEN NEEDLES) 32G X 4 MM MISC USE NIGHTLY WITH INSULIN 04/10/22   Pleas Koch, NP  Prenatal Vit-Fe Fumarate-FA (MULTIVITAMIN-PRENATAL) 27-0.8 MG TABS tablet Take 1 tablet by mouth in the morning.    [provider]    Family History Family History  Problem Relation Age of Onset   Diabetes Mother    Migraines Mother    Diabetes Father    Hypertension Father    Hypertension Maternal Grandmother    Diabetes Maternal Grandmother    Diabetes Maternal Grandfather    COPD Maternal Grandfather    Diabetes Paternal Grandmother    Hypertension Paternal Grandmother    Diabetes Paternal Grandfather    Hypertension Paternal Grandfather    Heart attack Paternal Grandfather    Asthma Sister     Social History Social History   Tobacco Use   Smoking status: Never   Smokeless tobacco: Never  Vaping Use   Vaping Use: Never used  Substance Use Topics   Alcohol use: No   Drug use: No     Allergies   Ancef [cefazolin], Fentanyl, Shrimp [shellfish allergy], Chlorhexidine, Morphine and related, and Pseudoephedrine hcl   Review of Systems Review of Systems  Constitutional:  Positive for activity change. Negative for appetite change, fatigue and fever.  Respiratory:  Negative for cough and shortness of breath.   Cardiovascular:  Negative for chest pain.  Gastrointestinal:  Negative for abdominal pain, diarrhea, nausea and vomiting.  Musculoskeletal:  Negative for arthralgias and myalgias.  Skin:  Positive for color change and wound.     Physical Exam Triage Vital Signs ED Triage Vitals [06/25/22 1857]  Enc Vitals Group     BP (!)  171/112     Pulse Rate (!) 110     Resp 18     Temp 97.7 F (36.5 C)     Temp Source Oral     SpO2 98 %     Weight      Height      Head Circumference      Peak Flow      Pain Score 10     Pain Loc      Pain Edu?      Excl. in [place]?    No data found.  Updated Vital Signs BP (!) 147/102   Pulse 100   Temp 97.7 F (36.5 C) (Oral)   Resp 18   LMP 06/08/2022 (Exact Date)   SpO2 98%   Visual Acuity Right Eye Distance:   Left Eye Distance:   Bilateral Distance:    Right Eye Near:   Left Eye Near:    Bilateral Near:     Physical Exam Vitals reviewed.  Constitutional:      General: She is awake. She is not in acute distress.    Appearance: Normal appearance. She is well-developed. She is not ill-appearing.     Comments: Very pleasant female appears stated age in no acute distress sitting comfortably in exam room  HENT:     Head: Normocephalic and atraumatic.  Cardiovascular:     Rate and Rhythm: Normal rate and regular rhythm.     Heart sounds: Normal heart sounds, S1 normal and S2 normal. No murmur heard. Pulmonary:     Effort: Pulmonary effort is normal.     Breath sounds: Normal breath sounds. No wheezing, rhonchi or rales.     Comments: Clear to auscultation bilaterally Abdominal:     Palpations: Abdomen is soft.     Tenderness: There is no abdominal tenderness.  Genitourinary:    Comments: 3 cm x 4 cm indurated painful nodule with small amount of central fluctuance noted right buttocks and perirectal region.  Patient was unable to tolerate rectal exam due to pain. Psychiatric:        Behavior: Behavior is cooperative.      UC Treatments / Results  Labs (all labs ordered are listed, but only abnormal results are displayed) Labs Reviewed - No data to display  EKG   Radiology No results found.  Procedures Procedures (including critical care time)  Medications Ordered in UC Medications - No data to display  Initial Impression / Assessment and Plan  / UC Course  I have reviewed the triage vital signs and the nursing notes.  Pertinent labs & imaging results that were available during my care of the patient were reviewed by me and considered in my medical decision making (see chart for details).     Patient was tachycardic on intake though this did improve slightly after sitting quietly for several minutes.  She reports extreme pain and is unable to tolerate rectal exam to determine if there is any rectal involvement.  Discussed that given location and severity of pain with associated tachycardia I think she needs further evaluation.  We initially discussed trial of outpatient antibiotics if she was able to get in with her primary care to arrange imaging and surgery referral quickly, however, after further discussion and his significant worsening of symptoms ultimately decided through shared decision making to go to the emergency room immediately.  Patient is accompanied by her mother who will take her to the ER for further evaluation and management today.  Final Clinical Impressions(s) / UC Diagnoses   Final diagnoses:  Perirectal abscess     Discharge Instructions      Please go to the emergency room for further evaluation and management as we discussed.     ED Prescriptions   None    PDMP not reviewed this encounter.   Terrilee Croak, PA-C 06/25/22 1929

## 2022-06-25 NOTE — ED Triage Notes (Addendum)
Pt pov from urgent care c/o possible perirectal abscess on the right side. Pt reports that it started on Tuesday and pain has gotten worse. Pt has history of abscesses.

## 2022-06-25 NOTE — ED Provider Notes (Signed)
[facility] EMERGENCY DEPARTMENT Provider Note   CSN: [id number] Arrival date & time: 06/25/22  1946     History  Chief Complaint  Patient presents with   Abscess    Julia Kline is a 36 y.o. female with a past medical history of obesity, hypertension, type 2 diabetes and abscesses presenting today with an abscess to her buttocks.  Says has been there since last Tuesday but became more painful and swollen on Friday.  Went to urgent care today and they decided she should come here for further evaluation.  No fevers or chills.  Says that she still able to help movements but it hurts.  Has not noted any drainage.  Has a history of diabetes and says that her sugars have been running high but she is "trying to get control of them."   Abscess      Home Medications Prior to Admission medications   Medication Sig Start Date End Date Taking? Authorizing Provider  Continuous Blood Gluc Sensor (FREESTYLE LIBRE 3 SENSOR) MISC Place 1 sensor on the skin every 14 days. Use to check glucose continuously. 01/25/22   Pleas Koch, NP  ibuprofen (ADVIL) 800 MG tablet Take 1 tablet (800 mg total) by mouth every 8 (eight) hours as needed. 03/06/22   Chancy Milroy, MD  insulin glargine (LANTUS SOLOSTAR) 100 UNIT/ML Solostar Pen Inject 25 Units into the skin daily. for diabetes. 06/14/22   Pleas Koch, NP  insulin lispro (HUMALOG KWIKPEN) 100 UNIT/ML KwikPen Inject 15 Units into the skin with breakfast, with lunch, and with evening meal. 03/29/22   Donnamae Jude, MD  Insulin Pen Needle (TECHLITE PEN NEEDLES) 32G X 4 MM MISC USE NIGHTLY WITH INSULIN 04/10/22   Pleas Koch, NP  Prenatal Vit-Fe Fumarate-FA (MULTIVITAMIN-PRENATAL) 27-0.8 MG TABS tablet Take 1 tablet by mouth in the morning.    [provider]      Allergies    Ancef [cefazolin], Fentanyl, Shrimp [shellfish allergy], Chlorhexidine, Morphine and related, and Pseudoephedrine hcl    Review of Systems    Review of Systems  Physical Exam Updated Vital Signs BP 126/77 (BP Location: Right Arm)   Pulse (!) 106   Temp 98.1 F (36.7 C) (Oral)   Resp 16   Ht 5\' 10"  (1.778 m)   Wt 116.1 kg   LMP 06/08/2022 (Exact Date)   SpO2 97%   BMI 36.73 kg/m  Physical Exam Vitals and nursing note reviewed.  Constitutional:      Appearance: Normal appearance.  HENT:     Head: Normocephalic and atraumatic.  Eyes:     General: No scleral icterus.    Conjunctiva/sclera: Conjunctivae normal.  Pulmonary:     Effort: Pulmonary effort is normal. No respiratory distress.  Genitourinary:    Comments: 3 cm x 3 cm indurated abscess with small amount of central fluctuance noted right buttocks and perirectal region.   Patient unable to tolerate internal rectal exam Skin:    Findings: No rash.  Neurological:     Mental Status: She is alert.  Psychiatric:        Mood and Affect: Mood normal.     ED Results / Procedures / Treatments   Labs (all labs ordered are listed, but only abnormal results are displayed) Labs Reviewed  CBC WITH DIFFERENTIAL/PLATELET - Abnormal; Notable for the following components:      Result Value   Hemoglobin 10.3 (*)    HCT 34.2 (*)    MCV 71.4 (*)  MCH 21.5 (*)    All other components within normal limits  BASIC METABOLIC PANEL - Abnormal; Notable for the following components:   Glucose, Bld 166 (*)    Calcium 8.7 (*)    All other components within normal limits  LACTIC ACID, PLASMA  HCG, SERUM, QUALITATIVE  LACTIC ACID, PLASMA    EKG None  Radiology CT PELVIS W CONTRAST  Result Date: 06/25/2022 CLINICAL DATA:  History of abscess with right-sided perirectal pain EXAM: CT PELVIS WITH CONTRAST TECHNIQUE: Multidetector CT imaging of the pelvis was performed using the standard protocol following the bolus administration of intravenous contrast. RADIATION DOSE REDUCTION: This exam was performed according to the departmental dose-optimization program which  includes automated exposure control, adjustment of the mA and/or kV according to patient size and/or use of iterative reconstruction technique. CONTRAST:  OMNIPAQUE IOHEXOL 300 MG/ML  SOLN COMPARISON:  None Available. FINDINGS: Urinary Tract:  No abnormality visualized. Bowel:  Unremarkable visualized pelvic bowel loops. Vascular/Lymphatic: No pathologically enlarged lymph nodes. No significant vascular abnormality seen. Reproductive:  No mass or other significant abnormality Other: Small inflammatory mass or fluid collection along the right gluteal cleft measuring 2.1 x 1.4 cm adjacent inflammatory fat stranding. No soft tissue gas. The thickening/stranding extends to the right side of the anal sphincter. Musculoskeletal: No suspicious bone lesions identified. IMPRESSION: 2.1 cm along the right gluteal cleft. Electronically Signed   By: Minerva Fester M.D.   On: 06/25/2022 23:13    Procedures Procedures   Medications Ordered in ED Medications  metroNIDAZOLE (FLAGYL) tablet 500 mg (has no administration in time range)  ciprofloxacin (CIPRO) tablet 500 mg (has no administration in time range)  ondansetron (ZOFRAN-ODT) disintegrating tablet 4 mg (has no administration in time range)  oxyCODONE (Oxy IR/ROXICODONE) immediate release tablet 5 mg (5 mg Oral Given 06/25/22 2130)  iohexol (OMNIPAQUE) 300 MG/ML solution 100 mL (100 mLs Intravenous Contrast Given 06/25/22 2246)    ED Course/ Medical Decision Making/ A&P                           Medical Decision Making Amount and/or Complexity of Data Reviewed Labs: ordered. Radiology: ordered.  Risk Prescription drug management.   36 year old female presenting with an abscess that appeared last week.  Has begun to enlarge and become increasingly tender.  Sent here by urgent care.  Physical exam: 3x3 meter area of induration with small amounts of fluctuance noted to the right perianal area.  Surrounding cellulitis.  Suspicion for tracking into  the rectal wall however I am unable to get a perfect exam secondary to patient's pain.    Labs: No wbc ct, normal lactic, afebrile  Imaging: CT pelvis ordered, viewed and interpreted by me and shows a 2.1 cm gluteal cleft abscess.  Medications: Given oxycodone evaluation patient reports some improvement in her pain  MDM/disposition: 36 year old female presenting with a perirectal abscess.  Sent from urgent care.  She has severe pain and is unable to tolerate internal exam.  CT imaging was ordered and it revealed gluteal cleft abscess.  Unfortunately, the fluctuant area is localized on patient's right anus.  Induration around the gluteal cleft and right buttocks.  Patient was offered incision and drainage.  She understood that the procedure will likely hurt however I told her I would likely be able to express a lot of purulent drainage.  We did discuss that there will likely be remaining infection pockets that is likely not be  able to break up the loculations as well as they have been in her previous abscesses in her axilla and on her trunk.  I told her that regardless of whether or not she allowed me to perform an I&D she needed to be started on antibiotics and see general surgery.  After long discussion with her and her mother they elected for discharge and outpatient management.  I will start her on ciprofloxacin and Flagyl and give her Zofran for nausea.  Percocet will be used for pain while the infection is treated with antibiotics.  Ultimately will likely require surgical management.  She and her mother are agreeable.   Final Clinical Impression(s) / ED Diagnoses Final diagnoses:  Perianal abscess    Rx / DC Orders ED Discharge Orders          Ordered    ciprofloxacin (CIPRO) 500 MG tablet  Every 12 hours        06/25/22 2330    metroNIDAZOLE (FLAGYL) 500 MG tablet  2 times daily        06/25/22 2330    oxyCODONE-acetaminophen (PERCOCET/ROXICET) 5-325 MG tablet  Every 6 hours PRN         06/25/22 2330    ondansetron (ZOFRAN) 4 MG tablet  Every 6 hours        06/25/22 2330           Results and diagnoses were explained to the patient. Return precautions discussed in full. Patient had no additional questions and expressed complete understanding.   This chart was dictated using voice recognition software.  Despite best efforts to proofread,  errors can occur which can change the documentation meaning.     Woodroe Chen 06/25/22 2338    Gloris Manchester, MD 06/26/22 1540

## 2022-06-25 NOTE — ED Notes (Signed)
Patient is being discharged from the Urgent Care and sent to the Emergency Department via personal vehicle . Per provider, patient is in need of higher level of care due to abscess. Patient is aware and verbalizes understanding of plan of care.  Vitals:   06/25/22 1857 06/25/22 1909  BP: (!) 171/112 (!) 147/102  Pulse: (!) 110 100  Resp: 18   Temp: 97.7 F (36.5 C)   SpO2: 98%

## 2022-06-25 NOTE — Discharge Instructions (Addendum)
Please go to the emergency room for further evaluation and management as we discussed. 

## 2022-06-25 NOTE — ED Notes (Signed)
Patient transported to CT 

## 2022-06-25 NOTE — ED Triage Notes (Signed)
Pt presents with possible abscess on right side of bottom. Reports it has been there since last Tuesday and has worsened since. Area is painful.

## 2022-06-26 ENCOUNTER — Encounter: Payer: Self-pay | Admitting: Primary Care

## 2022-06-26 ENCOUNTER — Telehealth: Payer: Self-pay | Admitting: Primary Care

## 2022-06-26 ENCOUNTER — Ambulatory Visit (INDEPENDENT_AMBULATORY_CARE_PROVIDER_SITE_OTHER): Payer: 59 | Admitting: Primary Care

## 2022-06-26 VITALS — BP 130/86 | HR 103 | Temp 98.6°F | Ht 70.0 in | Wt 262.0 lb

## 2022-06-26 DIAGNOSIS — K611 Rectal abscess: Secondary | ICD-10-CM | POA: Diagnosis not present

## 2022-06-26 DIAGNOSIS — E1165 Type 2 diabetes mellitus with hyperglycemia: Secondary | ICD-10-CM

## 2022-06-26 MED ORDER — GLIPIZIDE ER 5 MG PO TB24: 5.0000 mg | ORAL_TABLET | Freq: Every day | ORAL | 0 refills | Status: DC

## 2022-06-26 NOTE — Assessment & Plan Note (Signed)
Remains uncontrolled.  Continue Lantus 25 units daily, Humalog 15 units 3 times daily with meals. Add glipizide XL 5 mg daily.  I have asked that she update me regarding glucose levels in a few weeks.

## 2022-06-26 NOTE — Assessment & Plan Note (Signed)
Continue ciprofloxacin 500 mg twice daily and metronidazole 500 mg twice daily as prescribed.  Urgent referral placed to general surgery for evaluation.  ED precautions provided.  ED notes, labs, imaging reviewed.

## 2022-06-26 NOTE — Telephone Encounter (Signed)
Pt called stating she seen [facility] on today, 06/26/22 & there was a referral sent to [facility]. Pt was told the office couldn't see pt until next Thursday, 07/05/22. Pt asking can a referral be sent to outpatient surgery center through [facility] or somewhere else? Call back # [phone]

## 2022-06-26 NOTE — Progress Notes (Signed)
Subjective:    Patient ID: Julia Kline, female    DOB: 1985/12/22, 36 y.o.   MRN: [id number]  HPI  Lavren Cristina Ceniceros is a very pleasant 36 y.o. female with a history of type 2 diabetes, obesity, migraines, hypertension who presents today for ED follow-up.  She presented to [facility] urgent care yesterday for a 1 week history of enlarging abscess to the right buttocks.  Exam revealed a 3 cm x 4 cm indurated painful nodule with small amount of central fluctuance to right buttocks and perirectal region.  Unfortunately, she cannot tolerate the exam given her pain so she was sent to the emergency department for further evaluation.  She presented to [facility] ED shortly after for the same symptoms.  She underwent CT imaging which revealed gluteal cleft abscess.  There was discussion regarding I&D of the perirectal abscess, but she decided to forego given her pain.  She was discharged home with prescriptions for ciprofloxacin and metronidazole, Zofran, Percocet.  It was recommended she follow-up with surgical services for further management.  Since her ED visit she has taken two doses of her antibiotic regimen. This morning she began to notice bloody drainage from the rectal area. She denies fevers. She continues to experience a significant amount of pain from the abscess site.  Her glucose readings have been running in the mid 200's mostly since her last visit.  She will notice lower readings in the high 100s if she eats spinach and baked chicken.  She is compliant to Lantus 25 units daily, Humalog 15 units TID with meals.   Review of Systems  Constitutional:  Negative for fever.  Gastrointestinal:  Positive for constipation. Negative for abdominal pain.  Skin:  Positive for color change and wound.         Past Medical History:  Diagnosis Date   Acute conjunctivitis of right eye 07/10/2019   Acute pain of right knee 08/06/2019   Anemia 96/78/9381   Complicated UTI (urinary  tract infection) 03/10/2021   COVID 2020   had pneumonia   COVID-19    Diabetes mellitus without complication ([place])    Eczema    Hernia cerebri ([place])    Hidradenitis axillaris 04/20/2020   Followed by [place] [facility]. Last seen 05/26/2020: instructed to wash b/l with Hibiclens and take Doxycycline as prescribed and follow up in one month.    History of COVID-19 12/21/2019   History of gestational diabetes 05/02/2017   Normal pp testing 10/2017   History of severe pre-eclampsia 05/02/2017   Hypertension    on no medications   Impingement syndrome of left shoulder 02/16/2020   knee   Mastitis 06/18/2019   Migraine    PCOS (polycystic ovarian syndrome)    Pneumonia    Pregnancy induced hypertension    Skin lesions 09/12/2020    Social History   Socioeconomic History   Marital status: Married    Spouse name: Not on file   Number of children: Not on file   Years of education: Not on file   Highest education level: Not on file  Occupational History   Not on file  Tobacco Use   Smoking status: Never   Smokeless tobacco: Never  Vaping Use   Vaping Use: Never used  Substance and Sexual Activity   Alcohol use: No   Drug use: No   Sexual activity: Yes    Birth control/protection: None  Other Topics Concern   Not on file  Social History Narrative  Married.   2 children.   Works at [employer].   Social Determinants of Health   Financial Resource Strain: Not on file  Food Insecurity: Not on file  Transportation Needs: Not on file  Physical Activity: Not on file  Stress: Not on file  Social Connections: Not on file  Intimate Partner Violence: Not on file    Past Surgical History:  Procedure Laterality Date   CESAREAN SECTION  2017   Procedure: CESAREAN DELIVERY ONLY; Surgeon: Marian Sorrow, MD; Location: C-SECTION SUITE [facility]; Service: Obstetrics   CESAREAN SECTION N/A 10/02/2017   Procedure: CESAREAN SECTION;  Surgeon: Tilda Burrow, MD;   Location: [facility] BIRTHING SUITES;  Service: Obstetrics;  Laterality: N/A;   DILATION AND EVACUATION N/A 03/06/2022   Procedure: DILATATION AND EVACUATION;  Surgeon: Hermina Staggers, MD;  Location: [facility] OR;  Service: Gynecology;  Laterality: N/A;   I & D EXTREMITY Left 10/14/2020   Procedure: Irrigation and debridement left small and ring finger with repair of tendon;  Surgeon: Dominica Severin, MD;  Location: [facility] OR;  Service: Orthopedics;  Laterality: Left;  1 hr Block with IV Sed   LAPAROSCOPIC OOPHERECTOMY Left    LEEP     OVARIAN CYST REMOVAL     TONSILLECTOMY     WISDOM TOOTH EXTRACTION      Family History  Problem Relation Age of Onset   Diabetes Mother    Migraines Mother    Diabetes Father    Hypertension Father    Hypertension Maternal Grandmother    Diabetes Maternal Grandmother    Diabetes Maternal Grandfather    COPD Maternal Grandfather    Diabetes Paternal Grandmother    Hypertension Paternal Grandmother    Diabetes Paternal Grandfather    Hypertension Paternal Grandfather    Heart attack Paternal Grandfather    Asthma Sister     Allergies  Allergen Reactions   Ancef [Cefazolin] Hives and Swelling    Facial and neck swelling; Had reaction after receiving fentanyl and ancef in the OR   Fentanyl Hives and Swelling    Facial and neck swelling; Had reaction after receiving fentanyl and ancef in the OR   Shrimp [Shellfish Allergy] Swelling and Other (See Comments)    Swelling of throat   Chlorhexidine Hives, Itching and Rash   Morphine And Related Rash   Pseudoephedrine Hcl Other (See Comments)    Light headed/ dizzy    Current Outpatient Medications on File Prior to Visit  Medication Sig Dispense Refill   ciprofloxacin (CIPRO) 500 MG tablet Take 1 tablet (500 mg total) by mouth every 12 (twelve) hours. 10 tablet 0   Continuous Blood Gluc Sensor (FREESTYLE LIBRE 3 SENSOR) MISC Place 1 sensor on the skin every 14 days. Use to check glucose continuously. 6 each 3    insulin glargine (LANTUS SOLOSTAR) 100 UNIT/ML Solostar Pen Inject 25 Units into the skin daily. for diabetes. 30 mL 0   insulin lispro (HUMALOG KWIKPEN) 100 UNIT/ML KwikPen Inject 15 Units into the skin with breakfast, with lunch, and with evening meal. 15 mL 11   Insulin Pen Needle (TECHLITE PEN NEEDLES) 32G X 4 MM MISC USE NIGHTLY WITH INSULIN 100 each 1   metroNIDAZOLE (FLAGYL) 500 MG tablet Take 1 tablet (500 mg total) by mouth 2 (two) times daily. 14 tablet 0   ondansetron (ZOFRAN) 4 MG tablet Take 1 tablet (4 mg total) by mouth every 6 (six) hours. 12 tablet 0   oxyCODONE-acetaminophen (PERCOCET/ROXICET) 5-325 MG  tablet Take 1 tablet by mouth every 6 (six) hours as needed for severe pain. 12 tablet 0   ibuprofen (ADVIL) 800 MG tablet Take 1 tablet (800 mg total) by mouth every 8 (eight) hours as needed. (Patient not taking: Reported on 06/26/2022) 30 tablet 0   Prenatal Vit-Fe Fumarate-FA (MULTIVITAMIN-PRENATAL) 27-0.8 MG TABS tablet Take 1 tablet by mouth in the morning. (Patient not taking: Reported on 06/26/2022)     No current facility-administered medications on file prior to visit.    BP 130/86   Pulse (!) 103   Temp 98.6 F (37 C) (Temporal)   Ht 5\' 10"  (1.778 m)   Wt 262 lb (118.8 kg)   LMP 06/08/2022 (Exact Date)   SpO2 95%   BMI 37.59 kg/m  Objective:   Physical Exam Constitutional:      General: She is not in acute distress.    Comments: Appears uncomfortable  Cardiovascular:     Rate and Rhythm: Normal rate.  Pulmonary:     Effort: Pulmonary effort is normal.  Skin:    General: Skin is warm.     Comments: 3 cm X 4 cm firm abscess to left gluteal cleft perirectally. Small head to the center of abscess with scant amount of purulent drainage. Very tender upon light palpation.           Assessment & Plan:   Problem List Items Addressed This Visit       Digestive   Perirectal abscess - Primary    Continue ciprofloxacin 500 mg twice daily and metronidazole  500 mg twice daily as prescribed.  Urgent referral placed to general surgery for evaluation.  ED precautions provided.  ED notes, labs, imaging reviewed.      Relevant Orders   Ambulatory referral to General Surgery     Endocrine   Type 2 diabetes mellitus with hyperglycemia (HCC)    Remains uncontrolled.  Continue Lantus 25 units daily, Humalog 15 units 3 times daily with meals. Add glipizide XL 5 mg daily.  I have asked that she update me regarding glucose levels in a few weeks.        Relevant Medications   glipiZIDE (GLUCOTROL XL) 5 MG 24 hr tablet       06/10/2022, NP

## 2022-06-26 NOTE — Patient Instructions (Signed)
You will be contacted regarding your referral to general surgery.  Please let us know if you have not been contacted within two days.   Continue the antibiotic medication as prescribed.  Start glipizide XL 5 mg daily with breakfast for diabetes.  Please any blood sugar readings in a couple of weeks.  It was a pleasure to see you today!

## 2022-06-26 NOTE — Telephone Encounter (Signed)
Please notify patient that on the referral request I said that she was open to any location.  [place], can we get her in somewhere else sooner?

## 2022-06-27 NOTE — Telephone Encounter (Signed)
Noted, glad patient was able to get in! Thanks, Alcario Drought.

## 2022-06-28 ENCOUNTER — Ambulatory Visit (INDEPENDENT_AMBULATORY_CARE_PROVIDER_SITE_OTHER): Payer: 59 | Admitting: Surgery

## 2022-06-28 ENCOUNTER — Encounter: Payer: Self-pay | Admitting: Surgery

## 2022-06-28 VITALS — BP 140/100 | HR 94 | Temp 98.6°F | Ht 69.0 in | Wt 260.8 lb

## 2022-06-28 DIAGNOSIS — K611 Rectal abscess: Secondary | ICD-10-CM | POA: Diagnosis not present

## 2022-06-28 NOTE — Progress Notes (Signed)
Patient ID: Julia Kline, female   DOB: Aug 21, 1985, 36 y.o.   MRN: [id number]  Chief Complaint: Perianal abscess  History of Present Illness Julia Kline is a 36 y.o. female with right sided gluteal/perianal pain beginning November 1.  Presented to the ED where she was offered incision and drainage.  She elected to go with antibiotics only, refusing incision and drainage due to the pain.  She reports having previous trouble controlling her blood sugars.   She was seen by her primary care and referred here for incision and drainage.  She presents today with a draining process on her right buttock.  She denies vomiting, fevers or chills.  She reports being somewhat nauseated, she is currently taking her antibiotics.  She reports her bowel movements are without blood, and without pain.  She reports the pain in her buttock is about a 5 out of 10.  She reports she is getting back on her glipizide to help control her blood sugars.  Past Medical History Past Medical History:  Diagnosis Date   Acute conjunctivitis of right eye 07/10/2019   Acute pain of right knee 08/06/2019   Anemia 10/14/2019   Complicated UTI (urinary tract infection) 03/10/2021   COVID 2020   had pneumonia   COVID-19    Diabetes mellitus without complication (HCC)    Eczema    Hernia cerebri (HCC)    Hidradenitis axillaris 04/20/2020   Followed by [place] [facility]. Last seen 05/26/2020: instructed to wash b/l with Hibiclens and take Doxycycline as prescribed and follow up in one month.    History of COVID-19 12/21/2019   History of gestational diabetes 05/02/2017   Normal pp testing 10/2017   History of severe pre-eclampsia 05/02/2017   Hypertension    on no medications   Impingement syndrome of left shoulder 02/16/2020   knee   Mastitis 06/18/2019   Migraine    PCOS (polycystic ovarian syndrome)    Pneumonia    Pregnancy induced hypertension    Skin lesions 09/12/2020      Past Surgical  History:  Procedure Laterality Date   CESAREAN SECTION  2017   Procedure: CESAREAN DELIVERY ONLY; Surgeon: Marian Sorrow, MD; Location: C-SECTION SUITE [facility]; Service: Obstetrics   CESAREAN SECTION N/A 10/02/2017   Procedure: CESAREAN SECTION;  Surgeon: Tilda Burrow, MD;  Location: [facility] BIRTHING SUITES;  Service: Obstetrics;  Laterality: N/A;   DILATION AND EVACUATION N/A 03/06/2022   Procedure: DILATATION AND EVACUATION;  Surgeon: Hermina Staggers, MD;  Location: [facility] OR;  Service: Gynecology;  Laterality: N/A;   I & D EXTREMITY Left 10/14/2020   Procedure: Irrigation and debridement left small and ring finger with repair of tendon;  Surgeon: Dominica Severin, MD;  Location: [facility] OR;  Service: Orthopedics;  Laterality: Left;  1 hr Block with IV Sed   LAPAROSCOPIC OOPHERECTOMY Left    LEEP     OVARIAN CYST REMOVAL     TONSILLECTOMY     WISDOM TOOTH EXTRACTION      Allergies  Allergen Reactions   Ancef [Cefazolin] Hives and Swelling    Facial and neck swelling; Had reaction after receiving fentanyl and ancef in the OR   Fentanyl Hives and Swelling    Facial and neck swelling; Had reaction after receiving fentanyl and ancef in the OR   Shrimp [Shellfish Allergy] Swelling and Other (See Comments)    Swelling of throat   Chlorhexidine Hives, Itching and Rash   Morphine And Related Rash  Pseudoephedrine Hcl Other (See Comments)    Light headed/ dizzy    Current Outpatient Medications  Medication Sig Dispense Refill   ciprofloxacin (CIPRO) 500 MG tablet Take 1 tablet (500 mg total) by mouth every 12 (twelve) hours. 10 tablet 0   Continuous Blood Gluc Sensor (FREESTYLE LIBRE 3 SENSOR) MISC Place 1 sensor on the skin every 14 days. Use to check glucose continuously. 6 each 3   glipiZIDE (GLUCOTROL XL) 5 MG 24 hr tablet Take 1 tablet (5 mg total) by mouth daily with breakfast. for diabetes. 90 tablet 0   ibuprofen (ADVIL) 800 MG tablet Take 1 tablet (800 mg total) by mouth every 8 (eight)  hours as needed. 30 tablet 0   insulin glargine (LANTUS SOLOSTAR) 100 UNIT/ML Solostar Pen Inject 25 Units into the skin daily. for diabetes. 30 mL 0   insulin lispro (HUMALOG KWIKPEN) 100 UNIT/ML KwikPen Inject 15 Units into the skin with breakfast, with lunch, and with evening meal. 15 mL 11   Insulin Pen Needle (TECHLITE PEN NEEDLES) 32G X 4 MM MISC USE NIGHTLY WITH INSULIN 100 each 1   metroNIDAZOLE (FLAGYL) 500 MG tablet Take 1 tablet (500 mg total) by mouth 2 (two) times daily. 14 tablet 0   ondansetron (ZOFRAN) 4 MG tablet Take 1 tablet (4 mg total) by mouth every 6 (six) hours. 12 tablet 0   No current facility-administered medications for this visit.    Family History Family History  Problem Relation Age of Onset   Diabetes Mother    Migraines Mother    Diabetes Father    Hypertension Father    Hypertension Maternal Grandmother    Diabetes Maternal Grandmother    Diabetes Maternal Grandfather    COPD Maternal Grandfather    Diabetes Paternal Grandmother    Hypertension Paternal Grandmother    Diabetes Paternal Grandfather    Hypertension Paternal Grandfather    Heart attack Paternal Grandfather    Asthma Sister       Social History Social History   Tobacco Use   Smoking status: Never   Smokeless tobacco: Never  Vaping Use   Vaping Use: Never used  Substance Use Topics   Alcohol use: No   Drug use: No        Review of Systems  Constitutional: Negative.   HENT: Negative.    Eyes:  Positive for photophobia.  Respiratory: Negative.    Cardiovascular: Negative.   Gastrointestinal:  Positive for constipation.  Genitourinary: Negative.   Skin: Negative.   Neurological:  Positive for dizziness and headaches.  Psychiatric/Behavioral: Negative.       Physical Exam Blood pressure (!) 140/100, pulse 94, temperature 98.6 F (37 C), temperature source Oral, height 5\' 9"  (1.753 m), weight 260 lb 12.8 oz (118.3 kg), last menstrual period 06/08/2022, SpO2 99  %. Last Weight  Most recent update: 06/28/2022  4:16 PM    Weight  118.3 kg (260 lb 12.8 oz)             CONSTITUTIONAL: Well developed, obese and nourished, appropriately responsive and aware without distress.   EYES: Sclera non-icteric.   EARS, NOSE, MOUTH AND THROAT:  The oropharynx is clear. Oral mucosa is pink and moist.   Hearing is intact to voice.  NECK: Trachea is midline, and there is no jugular venous distension.  LYMPH NODES:  Lymph nodes in the neck are not appreciated. RESPIRATORY:  Lungs are clear, and breath sounds are equal bilaterally. Normal respiratory effort without pathologic use  of accessory muscles. CARDIOVASCULAR: Heart is regular in rate and rhythm.  Well perfused.  GI: The abdomen is soft, nontender, and nondistended. GU: Marylene Land is present as chaperone along with [employer].  Patient is placed in a kneeling position for adequate evaluation.  Right buttock is notable for an area of induration along the medial aspect of the natal cleft extending toward the anal region.  There is an opening which has evidence of a source of drainage.  With topical anesthetic and Q-tip I was able to probe the opening and assure that the cavity within it is limited and draining adequately.  I offered to extend the incision from the current opening with local anesthesia and an 11 blade.  I encouraged her that it will be much better tolerated than it would have been initially due to her diminished swelling and pain from having it partially drained.  I explained this may help her in the healing process.  She refused to have this done today.  I do believe she does have good drainage and if we employ sitz bath's this may help diminish the persisting induration.  She is to continue taking her antibiotics. MUSCULOSKELETAL:  Symmetrical muscle tone appreciated in all four extremities.    SKIN: Skin turgor is normal. No pathologic skin lesions appreciated.  NEUROLOGIC:  Motor and sensation appear grossly  normal.  Cranial nerves are grossly without defect. PSYCH:  Alert and oriented to person, place and time. Affect is appropriate for situation.  Data Reviewed I have personally reviewed what is currently available of the patient's imaging, recent labs and medical records.   Labs:     Latest Ref Rng & Units 06/25/2022    9:45 PM 03/06/2022    3:17 PM 03/03/2022    2:53 PM  CBC  WBC 4.0 - 10.5 K/uL 8.6  8.7  9.9   Hemoglobin 12.0 - 15.0 g/dL 54.2  70.6  23.7   Hematocrit 36.0 - 46.0 % 34.2  34.2  36.6   Platelets 150 - 400 K/uL 234  256  235       Latest Ref Rng & Units 06/25/2022    9:45 PM 11/30/2021   10:15 AM 10/14/2020    2:45 PM  CMP  Glucose 70 - 99 mg/dL 628  315  176   BUN 6 - 20 mg/dL 11  8  8    Creatinine 0.44 - 1.00 mg/dL  1.60  7.37   Sodium 135 - 145 mmol/L 136  136  135   Potassium 3.5 - 5.1 mmol/L 4.0  3.8  3.9   Chloride 98 - 111 mmol/L 103  100  100   CO2 22 - 32 mmol/L 24  29  25    Calcium 8.9 - 10.3 mg/dL 8.7  8.9  9.5   Total Protein 6.0 - 8.3 g/dL  7.5    Total Bilirubin 0.2 - 1.2 mg/dL  0.3    Alkaline Phos 39 - 117 U/L  62    AST 0 - 37 U/L  53    ALT 0 - 35 U/L  64     Imaging: Radiological images reviewed:  EXAM: CT PELVIS WITH CONTRAST   TECHNIQUE: Multidetector CT imaging of the pelvis was performed using the standard protocol following the bolus administration of intravenous contrast.   RADIATION DOSE REDUCTION: This exam was performed according to the departmental dose-optimization program which includes automated exposure control, adjustment of the mA and/or kV according to patient size and/or use of  iterative reconstruction technique.   CONTRAST:  OMNIPAQUE IOHEXOL 300 MG/ML  SOLN   COMPARISON:  None Available.   FINDINGS: Urinary Tract:  No abnormality visualized.   Bowel:  Unremarkable visualized pelvic bowel loops.   Vascular/Lymphatic: No pathologically enlarged lymph nodes. No significant vascular abnormality seen.    Reproductive:  No mass or other significant abnormality   Other: Small inflammatory mass or fluid collection along the right gluteal cleft measuring 2.1 x 1.4 cm adjacent inflammatory fat stranding. No soft tissue gas. The thickening/stranding extends to the right side of the anal sphincter.   Musculoskeletal: No suspicious bone lesions identified.   IMPRESSION: 2.1 cm along the right gluteal cleft.     Electronically Signed   By: Minerva Fester M.D.   On: 06/25/2022 23:13   Within last 24 hrs: No results found.  Assessment    Spontaneously draining perirectal/perianal abscess.  Currently improving on oral antibiotics. Patient Active Problem List   Diagnosis Date Noted   Perirectal abscess 06/26/2022   Class 2 severe obesity due to excess calories with serious comorbidity and body mass index (BMI) of 38.0 to 38.9 in adult [facility]) 06/14/2022   Ovalocytosis (HCC) 03/03/2022   History of pre-eclampsia in prior pregnancy, currently pregnant 02/15/2022   Type 2 diabetes mellitus with hyperglycemia (HCC) 12/21/2019   Hypertension 10/13/2019   Migraines 04/01/2018    Plan    Much of the plan was discussed under the exam above.  Encouraged her to utilize sitz bath's with tub or shower head soaking for good 20 minutes at least 3 times daily and have after every bowel movement. I believe she will comply with this, refusing any further intervention at this time.  I do not believe there is any undrained abscess present.  We will see her back in a week.  Face-to-face time spent with the patient and accompanying care providers(if present) was 40 minutes, with more than 50% of the time spent counseling, educating, and coordinating care of the patient.    These notes generated with voice recognition software. I apologize for typographical errors.  Campbell Lerner M.D., FACS 06/29/2022, 1:21 PM

## 2022-06-28 NOTE — Patient Instructions (Addendum)
If you have any concerns or questions, please feel free to call our office.   How to Take a [employer]  A sitz bath is a warm water bath that may be used to care for your rectum, genital area, or the area between your rectum and genitals (perineum). In a sitz bath, the water only comes up to your hips and covers your buttocks. A sitz bath may be done in a bathtub or with a portable sitz bath that fits over the toilet. Your health care provider may recommend a sitz bath to help: Relieve pain and discomfort after delivering a baby. Relieve pain and itching from hemorrhoids or anal fissures. Relieve pain after certain surgeries. Relax muscles that are sore or tight. How to take a sitz bath Take 2-4 sitz baths a day, or as many as told by your health care provider. Bathtub sitz bath To take a sitz bath in a bathtub: Partially fill a bathtub with warm water. The water should be deep enough to cover your hips and buttocks when you are sitting in the bathtub. Follow your health care provider's instructions if you are told to put medicine in the water. Sit in the water. Open the bathtub drain a little, and leave it open during your bath. Turn on the warm water again, enough to replace the water that is draining out. Keep the water running throughout your bath. This helps keep the water at the right level and temperature. Soak in the water for 15-20 minutes, or as long as told by your health care provider. When you are done, be careful when you stand up. You may feel dizzy. After the sitz bath, pat yourself dry. Do not rub your skin to dry it.  Over-the-toilet sitz bath To take a sitz bath with an over-the-toilet basin: Follow the manufacturer's instructions. Fill the basin with warm water. Follow your health care provider's instructions if you were told to put medicine in the water. Sit on the seat. Make sure the water covers your buttocks and perineum. Soak in the water for 15-20 minutes, or as  long as told by your health care provider. After the sitz bath, pat yourself dry. Do not rub your skin to dry it. Clean and dry the basin between uses. Discard the basin if it cracks, or according to the manufacturer's instructions.  Contact a health care provider if: Your pain or itching gets worse. Stop doing sitz baths if your symptoms get worse. You have new symptoms. Stop doing sitz baths until you talk with your health care provider. Summary A sitz bath is a warm water bath in which the water only comes up to your hips and covers your buttocks. Your health care provider may recommend a sitz bath to help relieve pain and discomfort after delivering a baby, relieve pain and itching from hemorrhoids or anal fissures, relieve pain after certain surgeries, or help to relax muscles that are sore or tight. Take 2-4 sitz baths a day, or as many as told by your health care provider. Soak in the water for 15-20 minutes. Stop doing sitz baths if your symptoms get worse. This information is not intended to replace advice given to you by your health care provider. Make sure you discuss any questions you have with your health care provider. Document Revised: 11/07/2021 Document Reviewed: 11/07/2021 Elsevier Patient Education  2023 [employer]   Anorectal Abscess An abscess is an infected area that contains a collection of pus. An anorectal abscess is  an abscess that is near the opening of the anus or around the rectum. Without treatment, an anorectal abscess can become larger and cause other problems, such as a more serious body-wide infection or pain, especially during bowel movements. What are the causes? This condition is caused by plugged glands or an infection in one of these areas: The anus. The area between the anus and the scrotum in males or between the anus and the vagina in females (perineum). What increases the risk? The following factors may make you more likely to develop this  condition: Diabetes or inflammatory bowel disease. Having a body defense system (immune system) that is weak. Engaging in anal sex. Having a sexually transmitted infection (STI). Certain kinds of cancer, such as rectal carcinoma, leukemia, or lymphoma. What are the signs or symptoms? The main symptom of this condition is pain. The pain may be a throbbing pain that gets worse during bowel movements. Other symptoms include: Swelling and redness in the area of the abscess. The redness may go beyond the abscess and appear as a red streak on the skin. A visible, painful lump, or a lump that can be felt when touched. Bleeding or pus-like discharge from the area. Fever. General weakness. Constipation. Diarrhea. How is this diagnosed? This condition is diagnosed based on your medical history and a physical exam of the affected area. This may involve examining the rectal area with a gloved hand (digital rectal exam). Sometimes, the health care provider needs to look into the rectum using a probe, scope, or imaging test. For women, it may require a careful vaginal exam. How is this treated? Treatment for this condition may include: Incision and drainage surgery. This involves making an incision over the abscess to drain the pus. Medicines, including antibiotic medicine, pain medicine, stool softeners, or laxatives. Follow these instructions at home: Medicines Take over-the-counter and prescription medicines only as told by your health care provider. If you were prescribed an antibiotic medicine, use it as told by your health care provider. Do not stop using the antibiotic even if you start to feel better. Do not drive or use heavy machinery while taking prescription pain medicine. Wound care  If gauze was used in the abscess, follow instructions from your health care provider about removing or changing the gauze. It can usually be removed in 2-3 days. Wash your hands with soap and water before  you remove or change your gauze. If soap and water are not available, use hand sanitizer. If one or more drains were placed in the abscess cavity, be careful not to pull at them. Your health care provider will tell you how long they need to remain in place. Check your incision area every day for signs of infection. Check for: More redness, swelling, or pain. More fluid or blood. Warmth. Pus or a bad smell. Managing pain, stiffness, and swelling  Take a sitz bath 3-4 times a day and after bowel movements. This will help reduce pain and swelling. To relieve pain, try sitting: On a heating pad with the setting on low. On an inflatable donut-shaped cushion. If directed, put ice on the affected area: Put ice in a plastic bag. Place a towel between your skin and the bag. Leave the ice on for 20 minutes, 2-3 times a day. General instructions Follow any diet instructions given by your health care provider. Keep all follow-up visits as told by your health care provider. This is important. Contact a health care provider if you have: Bleeding  from your incision. Pain, swelling, or redness that does not improve or gets worse. Trouble passing stool or urine. Symptoms that return after treatment. Get help right away if you: Have problems moving or using your legs. Have severe or increasing pain. Have swelling in the affected area that suddenly gets worse. Have a large increase in bleeding or passing of pus. Develop chills or a fever. Summary An anorectal abscess is an abscess that is near the opening of the anus or around the rectum. An abscess is an infected area that contains a collection of pus. The main symptom of this condition is pain. It may be a throbbing pain that gets worse during bowel movements. Treatment for an anorectal abscess may include surgery to drain the pus from the abscess. Medicines and sitz baths may also be a part of your treatment plan. This information is not intended  to replace advice given to you by your health care provider. Make sure you discuss any questions you have with your health care provider. Document Revised: 12/20/2021 Document Reviewed: 02/22/2021 Elsevier Patient Education  2023 [employer].

## 2022-07-02 NOTE — Telephone Encounter (Signed)
Another referral will need to be placed due to the location being changed on the other one. Alcario Drought

## 2022-07-02 NOTE — Telephone Encounter (Signed)
Pt called stating she saw Dr. Claudine Mouton on 06/28/22. Pt stated she did not like Dr. Claudine Mouton due to him making constant jokes during ov. Pt stated Dr. Claudine Mouton wasn't someone she'd like operating on her, he wasn't professional. Pt sated she once had a referral to [facility] but it was cancelled per her request, pt is asking can another referral be sent back? Call back @ [phone]

## 2022-07-02 NOTE — Telephone Encounter (Signed)
Thanks. New referral placed.

## 2022-07-02 NOTE — Addendum Note (Signed)
Addended by: Doreene Nest on: 07/02/2022 05:37 PM   Modules accepted: Orders

## 2022-07-05 ENCOUNTER — Ambulatory Visit: Payer: 59 | Admitting: Surgery

## 2022-07-05 ENCOUNTER — Ambulatory Visit: Payer: 59 | Admitting: General Surgery

## 2022-07-18 ENCOUNTER — Other Ambulatory Visit: Payer: Self-pay | Admitting: Primary Care

## 2022-07-18 DIAGNOSIS — E1165 Type 2 diabetes mellitus with hyperglycemia: Secondary | ICD-10-CM

## 2022-07-26 ENCOUNTER — Emergency Department (HOSPITAL_COMMUNITY)
Admission: EM | Admit: 2022-07-26 | Discharge: 2022-07-26 | Disposition: A | Discharge status: Home / Self Care | Payer: 59 | Attending: Emergency Medicine | Admitting: Emergency Medicine

## 2022-07-26 ENCOUNTER — Other Ambulatory Visit: Payer: Self-pay

## 2022-07-26 ENCOUNTER — Emergency Department (HOSPITAL_COMMUNITY): Payer: 59

## 2022-07-26 ENCOUNTER — Telehealth: Payer: Self-pay | Admitting: Primary Care

## 2022-07-26 DIAGNOSIS — R000 Tachycardia, unspecified: Secondary | ICD-10-CM | POA: Insufficient documentation

## 2022-07-26 DIAGNOSIS — R519 Headache, unspecified: Secondary | ICD-10-CM

## 2022-07-26 DIAGNOSIS — D649 Anemia, unspecified: Secondary | ICD-10-CM | POA: Insufficient documentation

## 2022-07-26 DIAGNOSIS — R0789 Other chest pain: Secondary | ICD-10-CM | POA: Diagnosis present

## 2022-07-26 DIAGNOSIS — R079 Chest pain, unspecified: Secondary | ICD-10-CM

## 2022-07-26 DIAGNOSIS — Z7984 Long term (current) use of oral hypoglycemic drugs: Secondary | ICD-10-CM | POA: Diagnosis not present

## 2022-07-26 DIAGNOSIS — R7401 Elevation of levels of liver transaminase levels: Secondary | ICD-10-CM | POA: Diagnosis not present

## 2022-07-26 DIAGNOSIS — Z794 Long term (current) use of insulin: Secondary | ICD-10-CM | POA: Diagnosis not present

## 2022-07-26 DIAGNOSIS — E119 Type 2 diabetes mellitus without complications: Secondary | ICD-10-CM | POA: Insufficient documentation

## 2022-07-26 DIAGNOSIS — R42 Dizziness and giddiness: Secondary | ICD-10-CM | POA: Insufficient documentation

## 2022-07-26 DIAGNOSIS — R030 Elevated blood-pressure reading, without diagnosis of hypertension: Secondary | ICD-10-CM | POA: Insufficient documentation

## 2022-07-26 LAB — URINALYSIS, ROUTINE W REFLEX MICROSCOPIC
Bilirubin Urine: NEGATIVE
Glucose, UA: NEGATIVE mg/dL
Hgb urine dipstick: NEGATIVE
Ketones, ur: NEGATIVE mg/dL
Leukocytes,Ua: NEGATIVE
Nitrite: NEGATIVE
Protein, ur: NEGATIVE mg/dL
Specific Gravity, Urine: 1.01 (ref 1.005–1.030)
pH: 7 (ref 5.0–8.0)

## 2022-07-26 LAB — D-DIMER, QUANTITATIVE: D-Dimer, Quant: <0.27 ug/mL-FEU (ref 0.00–0.50)

## 2022-07-26 LAB — HEPATIC FUNCTION PANEL
ALT: 66 U/L — ABNORMAL HIGH (ref 0–44)
AST: 48 U/L — ABNORMAL HIGH (ref 15–41)
Albumin: 4.1 g/dL (ref 3.5–5.0)
Alkaline Phosphatase: 70 U/L (ref 38–126)
Bilirubin, Direct: 0.1 mg/dL (ref 0.0–0.2)
Indirect Bilirubin: 0.1 mg/dL — ABNORMAL LOW (ref 0.3–0.9)
Total Bilirubin: 0.2 mg/dL — ABNORMAL LOW (ref 0.3–1.2)
Total Protein: 8.1 g/dL (ref 6.5–8.1)

## 2022-07-26 LAB — I-STAT CHEM 8, ED
BUN: 7 mg/dL (ref 6–20)
Calcium, Ion: 1.21 mmol/L (ref 1.15–1.40)
Chloride: 103 mmol/L (ref 98–111)
Creatinine, Ser: 0.6 mg/dL (ref 0.44–1.00)
Glucose, Bld: 95 mg/dL (ref 70–99)
HCT: 39 % (ref 36.0–46.0)
Hemoglobin: 13.3 g/dL (ref 12.0–15.0)
Potassium: 3.7 mmol/L (ref 3.5–5.1)
Sodium: 140 mmol/L (ref 135–145)
TCO2: 25 mmol/L (ref 22–32)

## 2022-07-26 LAB — TROPONIN I (HIGH SENSITIVITY)
Troponin I (High Sensitivity): 2 ng/L (ref <18)
Troponin I (High Sensitivity): 2 ng/L

## 2022-07-26 LAB — BASIC METABOLIC PANEL
Anion gap: 9 (ref 5–15)
BUN: 9 mg/dL (ref 6–20)
CO2: 24 mmol/L (ref 22–32)
Calcium: 9.2 mg/dL (ref 8.9–10.3)
Chloride: 104 mmol/L (ref 98–111)
Creatinine, Ser: 0.64 mg/dL (ref 0.44–1.00)
GFR, Estimated: >60 mL/min (ref >60)
Glucose, Bld: 99 mg/dL (ref 70–99)
Potassium: 3.5 mmol/L (ref 3.5–5.1)
Sodium: 137 mmol/L (ref 135–145)

## 2022-07-26 LAB — CBC
HCT: 37.8 % (ref 36.0–46.0)
Hemoglobin: 11.3 g/dL — ABNORMAL LOW (ref 12.0–15.0)
MCH: 21.6 pg — ABNORMAL LOW (ref 26.0–34.0)
MCHC: 29.9 g/dL — ABNORMAL LOW (ref 30.0–36.0)
MCV: 72.4 fL — ABNORMAL LOW (ref 80.0–100.0)
Platelets: 288 10*3/uL (ref 150–400)
RBC: 5.22 MIL/uL — ABNORMAL HIGH (ref 3.87–5.11)
RDW: 15.4 % (ref 11.5–15.5)
WBC: 7.4 10*3/uL (ref 4.0–10.5)
nRBC: 0 % (ref 0.0–0.2)

## 2022-07-26 LAB — POC URINE PREG, ED: Preg Test, Ur: NEGATIVE

## 2022-07-26 LAB — CBG MONITORING, ED: Glucose-Capillary: 86 mg/dL (ref 70–99)

## 2022-07-26 MED ORDER — METOCLOPRAMIDE HCL 5 MG/ML IJ SOLN
10.0000 mg | Freq: Once | INTRAMUSCULAR | Status: AC
  Administered 2022-07-26: 10 mg via INTRAVENOUS
  Filled 2022-07-26: qty 2

## 2022-07-26 MED ORDER — DEXAMETHASONE SODIUM PHOSPHATE 10 MG/ML IJ SOLN
10.0000 mg | Freq: Once | INTRAMUSCULAR | Status: AC
  Administered 2022-07-26: 10 mg via INTRAVENOUS
  Filled 2022-07-26: qty 1

## 2022-07-26 MED ORDER — KETOROLAC TROMETHAMINE 15 MG/ML IJ SOLN
15.0000 mg | Freq: Once | INTRAMUSCULAR | Status: AC
  Administered 2022-07-26: 15 mg via INTRAVENOUS
  Filled 2022-07-26: qty 1

## 2022-07-26 NOTE — ED Triage Notes (Signed)
Pt presents with CP, dizziness, HTN, for one day, had BP taken at [employer] and it was in 200s.

## 2022-07-26 NOTE — Telephone Encounter (Signed)
Patient called in and stated that her blood pressure is 208/148. She stated she is experiencing some dizziness, lightheadedness, headache, and pressure on her chest. Sent over to access.

## 2022-07-26 NOTE — Discharge Instructions (Signed)
You were seen in the emergency room today for evaluation of your chest pain and high blood pressure.  Your imaging and lab work were unremarkable.  Your blood pressures have been mildly elevated, but nothing significant.  I am glad that you are following with your primary care doctor tomorrow.  Please keep this appointment.  I am also sending you an ambulatory referral for cardiologist given your chest pain without any findings.  If you have any concerns, new or worsening symptoms, please return to the nearest emergency department for evaluation.  Contact a doctor if: Your chest pain does not go away. You feel depressed. You have a fever. Get help right away if: Your chest pain is worse. You have a cough that gets worse, or you cough up blood. You have very bad (severe) pain in your belly (abdomen). You pass out (faint). You have either of these for no clear reason: Sudden chest discomfort. Sudden discomfort in your arms, back, neck, or jaw. You have shortness of breath at any time. You suddenly start to sweat, or your skin gets clammy. You feel sick to your stomach (nauseous). You throw up (vomit). You suddenly feel lightheaded or dizzy. You feel very weak or tired. Your heart starts to beat fast, or it feels like it is skipping beats. These symptoms may be an emergency. Do not wait to see if the symptoms will go away. Get medical help right away. Call your local emergency services (911 in the [place] Do not drive yourself to the hospital.

## 2022-07-26 NOTE — Telephone Encounter (Signed)
[facility] Day - Client TELEPHONE ADVICE RECORD [facility] Patient Name: Julia Kline Gender: Female DOB: 1986-02-01 Age: 36 Y 9 M 12 D Return Phone Number: [phone] (Primary) Address: City/ State/ Zip: [place] [address] Client [facility] Day - Client Client Site [facility] - Day Provider Vernona Rieger - NP Contact Type Call Who Is Calling Patient / Member / Family / Caregiver Call Type Triage / Clinical Relationship To Patient Self Return Phone Number [phone] (Primary) Chief Complaint BLOOD PRESSURE HIGH - Systolic (top number) 200 or greater Reason for Call Symptomatic / Request for Health Information Initial Comment Caller has a patient who has 208/148 bp and dizzy and lighted headedness. Chest pressure last night. Unsure if happening this morning. GOTO Facility Not Listed [place] [facility] No Nurse Assessment Nurse: Stefano Gaul, RN, Vera Date/Time (Eastern Time): 07/26/2022 12:40:05 PM Confirm and document reason for call. If symptomatic, describe symptoms. ---Caller states her BP is 208/148. she is dizzy and lightheaded. Had chest pain last night. Has a headache. Does the patient have any new or worsening symptoms? ---Yes Will a triage be completed? ---Yes Related visit to physician within the last 2 weeks? ---No Does the PT have any chronic conditions? (i.e. diabetes, asthma, this includes High risk factors for pregnancy, etc.) ---Yes List chronic conditions. ---diabetes Is the patient pregnant or possibly pregnant? (Ask all females between the ages of 14-55) ---No Is this a behavioral health or substance abuse call? ---No Guidelines Guideline Title Affirmed Question Affirmed Notes Nurse Date/Time (Eastern Time) Blood Pressure - High [1] Systolic BP >= 160 OR Diastolic >= 100 AND [2] cardiac (e.g., breathing difficulty, chest pain) or neurologic symptoms  (e.g., Stefano Gaul, RN, Dwana Curd 07/26/2022 12:41:41 PM PLEASE NOTE: All timestamps contained within this report are represented as [place] Standard Time. CONFIDENTIALTY NOTICE: This fax transmission is intended only for the addressee. It contains information that is legally privileged, confidential or otherwise protected from use or disclosure. If you are not the intended recipient, you are strictly prohibited from reviewing, disclosing, copying using or disseminating any of this information or taking any action in reliance on or regarding this information. If you have received this fax in error, please notify us immediately by telephone so that we can arrange for its return to [place]. Phone: [phone], Toll-Free: [phone], Fax: [phone] Page: 2 of 2 Call Id: [id number] Guidelines Guideline Title Affirmed Question Affirmed Notes Nurse Date/Time Lamount Cohen Time) new-onset blurred or double vision, unsteady gait) Disp. Time Lamount Cohen Time) Disposition Final User 07/26/2022 12:38:51 PM Send to Urgent Queue Lorre Munroe 07/26/2022 12:44:08 PM Go to ED Now Yes Stefano Gaul, RN, Dwana Curd Final Disposition 07/26/2022 12:44:08 PM Go to ED Now Yes Stefano Gaul, RN, Clerance Lav Disagree/Comply Comply Caller Understands Yes PreDisposition Call Doctor Care Advice Given Per Guideline GO TO ED NOW: * You need to be seen in the Emergency Department. NOTE TO TRIAGER - DRIVING: * Another adult should drive. CALL EMS 911 IF: * Passes out or faints * Becomes too weak to stand Referrals GO TO FACILITY OTHER - [facility]

## 2022-07-26 NOTE — ED Provider Notes (Signed)
[facility] EMERGENCY DEPARTMENT Provider Note   CSN: [id number] Arrival date & time: 07/26/22  1326     History Chief Complaint  Patient presents with   Chest Pain    Julia Kline is a 36 y.o. female with type 2 diabetes presents the emergency room today for evaluation of chest pain with headache and dizziness sensation.  Patient reports that she started having chest pain that was mainly central but a little left-sided yesterday.  She reports that she decided take her blood pressure and it was elevated in the 160s.  She reports that she started having some dizziness sensation with some lightheadedness last night.  She reports it was worse whenever she was moving around.  She needed help going to bed.  She reports that when she woke up this morning, she did feel better.  However when she was getting up and moving around the dizziness and lightheadedness returned.  She reports that she still has some chest pain and describes it as pressure-like sensation.  She denies any shortness of breath, palpations, nausea, vomiting, abdominal pain.  She denies any blurry vision although reports she does have some photophobia.  She reports that she went to the fire department today because of her elevated blood pressure and they said it was 208/140 and recommended that she come to the emergency department.  While sitting in the emergency room, she currently does not have any dizziness.  She reports that her headache is above her eyebrows that she is been having since yesterday to.  Denies any sudden onset of headache.   Chest Pain Associated symptoms: dizziness and headache   Associated symptoms: no abdominal pain, no cough, no fever, no nausea, no palpitations, no shortness of breath and no vomiting        Home Medications Prior to Admission medications   Medication Sig Start Date End Date Taking? Authorizing Provider  ciprofloxacin (CIPRO) 500 MG tablet Take 1 tablet (500 mg total) by mouth  every 12 (twelve) hours. 06/25/22   Redwine, Madison A, PA-C  Continuous Blood Gluc Sensor (FREESTYLE LIBRE 3 SENSOR) MISC Place 1 sensor on the skin every 14 days. Use to check glucose continuously. 01/25/22   Pleas Koch, NP  glipiZIDE (GLUCOTROL XL) 5 MG 24 hr tablet Take 1 tablet (5 mg total) by mouth daily with breakfast. for diabetes. 06/26/22   Pleas Koch, NP  ibuprofen (ADVIL) 800 MG tablet Take 1 tablet (800 mg total) by mouth every 8 (eight) hours as needed. 03/06/22   Chancy Milroy, MD  insulin glargine (LANTUS SOLOSTAR) 100 UNIT/ML Solostar Pen Inject 25 Units into the skin daily. for diabetes. 06/14/22   Pleas Koch, NP  insulin lispro (HUMALOG KWIKPEN) 100 UNIT/ML KwikPen Inject 15 Units into the skin with breakfast, with lunch, and with evening meal. 03/29/22   Donnamae Jude, MD  Insulin Pen Needle (TECHLITE PEN NEEDLES) 32G X 4 MM MISC USE NIGHTLY WITH INSULIN 04/10/22   Pleas Koch, NP  metroNIDAZOLE (FLAGYL) 500 MG tablet Take 1 tablet (500 mg total) by mouth 2 (two) times daily. 06/25/22   Redwine, Madison A, PA-C  ondansetron (ZOFRAN) 4 MG tablet Take 1 tablet (4 mg total) by mouth every 6 (six) hours. 06/25/22   Redwine, Madison A, PA-C      Allergies    Ancef [cefazolin], Fentanyl, Shrimp [shellfish allergy], Chlorhexidine, Morphine and related, and Pseudoephedrine hcl    Review of Systems   Review of Systems  Constitutional:  Negative for chills and fever.  Respiratory:  Negative for cough and shortness of breath.   Cardiovascular:  Positive for chest pain. Negative for palpitations and leg swelling.  Gastrointestinal:  Negative for abdominal pain, constipation, diarrhea, nausea and vomiting.  Neurological:  Positive for dizziness, light-headedness and headaches. Negative for syncope.    Physical Exam Updated Vital Signs BP (!) 128/51 (BP Location: Left Arm)   Pulse 97   Temp 97.9 F (36.6 C) (Oral)   Resp 15   Ht 5\' 10"  (1.778 m)   Wt  113.4 kg   LMP 07/12/2022   SpO2 100%   BMI 35.87 kg/m  Physical Exam Vitals and nursing note reviewed.  Constitutional:      General: She is not in acute distress.    Appearance: Normal appearance. She is ill-appearing. She is not toxic-appearing.  HENT:     Head: Normocephalic and atraumatic.  Eyes:     General: No scleral icterus.    Extraocular Movements: Extraocular movements intact.     Pupils: Pupils are equal, round, and reactive to light.  Cardiovascular:     Rate and Rhythm: Regular rhythm. Tachycardia present.  Pulmonary:     Effort: Pulmonary effort is normal. No respiratory distress.     Breath sounds: Normal breath sounds.  Chest:     Chest wall: No tenderness.  Abdominal:     General: Abdomen is flat. Bowel sounds are normal.     Palpations: Abdomen is soft.     Tenderness: There is no abdominal tenderness. There is no guarding or rebound.  Musculoskeletal:        General: No deformity.     Cervical back: Normal range of motion.     Right lower leg: No tenderness. No edema.     Left lower leg: No tenderness. No edema.  Skin:    General: Skin is warm and dry.  Neurological:     General: No focal deficit present.     Mental Status: She is alert. Mental status is at baseline.     GCS: GCS eye subscore is 4. GCS verbal subscore is 5. GCS motor subscore is 6.     Cranial Nerves: No cranial nerve deficit, dysarthria or facial asymmetry.     Sensory: No sensory deficit.     Motor: No weakness or pronator drift.     Coordination: Coordination normal. Finger-Nose-Finger Test normal.     Comments: Patient is alert.  GCS 15.  No cranial nerve deficit.  No dysarthria.  No facial droop noted.  Patient is answering questions appropriately with appropriate speech.  No sensation deficit noted.  No pronator drift.  Strength is equal in patient's upper and lower bilateral extremities.  Normal finger-nose.  Normal coordination.     ED Results / Procedures / Treatments    Labs (all labs ordered are listed, but only abnormal results are displayed) Labs Reviewed  CBC - Abnormal; Notable for the following components:      Result Value   RBC 5.22 (*)    Hemoglobin 11.3 (*)    MCV 72.4 (*)    MCH 21.6 (*)    MCHC 29.9 (*)    All other components within normal limits  HEPATIC FUNCTION PANEL - Abnormal; Notable for the following components:   AST 48 (*)    ALT 66 (*)    Total Bilirubin 0.2 (*)    Indirect Bilirubin 0.1 (*)    All other components within normal limits  BASIC METABOLIC PANEL  URINALYSIS, ROUTINE W REFLEX MICROSCOPIC  D-DIMER, QUANTITATIVE (NOT AT [facility])  I-STAT BETA HCG BLOOD, ED ([facility], [facility], [facility] ONLY)  CBG MONITORING, ED  I-STAT CHEM 8, ED  POC URINE PREG, ED  TROPONIN I (HIGH SENSITIVITY)  TROPONIN I (HIGH SENSITIVITY)    EKG EKG Interpretation  Date/Time:  Thursday July 26 2022 14:02:30 EST Ventricular Rate:  91 PR Interval:  142 QRS Duration: 80 QT Interval:  368 QTC Calculation: 452 R Axis:   84 Text Interpretation: Normal sinus rhythm Normal ECG When compared with ECG of 06-Mar-2022 14:05, Nonspecific T wave abnormality now evident in Lateral leads Confirmed by Octaviano Glow [phone]) on 07/26/2022 2:35:38 PM  Radiology CT Head Wo Contrast  Result Date: 07/26/2022 CLINICAL DATA:  Dizziness, hypertension EXAM: CT HEAD WITHOUT CONTRAST TECHNIQUE: Contiguous axial images were obtained from the base of the skull through the vertex without intravenous contrast. RADIATION DOSE REDUCTION: This exam was performed according to the departmental dose-optimization program which includes automated exposure control, adjustment of the mA and/or kV according to patient size and/or use of iterative reconstruction technique. COMPARISON:  07/24/2014 FINDINGS: Brain: No acute intracranial findings are seen. Ventricles are not dilated. There are no signs of bleeding within the cranium. Cortical sulci are prominent. Vascular: Unremarkable. Skull:  Unremarkable. Sinuses/Orbits: Unremarkable. Other: No significant interval changes are noted. IMPRESSION: No acute intracranial findings are seen in noncontrast CT brain. Electronically Signed   By: Elmer Picker M.D.   On: 07/26/2022 16:55   DG Chest 2 View  Result Date: 07/26/2022 CLINICAL DATA:  Chest pain and dizziness. EXAM: CHEST - 2 VIEW COMPARISON:  Chest two views 03/25/2020 FINDINGS: Cardiac silhouette and mediastinal contours are within normal limits. The lungs are clear. No pleural effusion or pneumothorax. No acute skeletal abnormality. IMPRESSION: No active cardiopulmonary disease. Electronically Signed   By: Yvonne Kendall M.D.   On: 07/26/2022 14:32    Procedures Procedures   Medications Ordered in ED Medications  ketorolac (TORADOL) 15 MG/ML injection 15 mg (15 mg Intravenous Given 07/26/22 1615)  metoCLOPramide (REGLAN) injection 10 mg (10 mg Intravenous Given 07/26/22 1616)  dexamethasone (DECADRON) injection 10 mg (10 mg Intravenous Given 07/26/22 1616)    ED Course/ Medical Decision Making/ A&P                           Medical Decision Making Amount and/or Complexity of Data Reviewed Labs: ordered. Radiology: ordered.  Risk Prescription drug management.    36 year old female presents the emergency department today for evaluation of elevated blood pressure, headache, chest pain, dizziness, lightheadedness.  Differential diagnosis includes was limited to central vertigo, peripheral vertigo, stroke, ACS, arrhythmia, PE, pneumonia, pneumothorax, aneurysm, dissection, complex migraine, SAH, ICH, TIA.  Vital signs show blood pressure at 147/114, afebrile, pulse rate 98, satting well on room air without increased work of breathing.  Physical exam as noted above.  I doubt any central vertigo at this time as this patient's vertigo is worsened with movement.  I doubt any stroke as she does not have any focal neurodeficits.  Possibly could be complex migraine.  Will order  head CT and migraine cocktail.  For the patient's chest pain.  Will order chest pain workup as well as adding on a D-dimer given her borderline going into tachycardia.  I independent reviewed and interpreted the patient's labs.  Urinalysis unremarkable.  Pregnancy test is negative.  CBC shows mild anemia with hemoglobin 1.3 although  appears to be at patient's baseline.  White blood cell count at 7.4.  D-dimer undetectable.  Troponin at 2.  BMP shows no electrolyte abnormalities.  Hepatic function panel shows some mildly elevated AST and ALT.  CBG within normal limits.  Chest x-ray is unremarkable.  CT image of the head is negative.  Patient was given Decadron, Reglan, and Toradol for her migraine.  On reevaluation, patient reports that she is feeling much better.  She was ambulated by nursing staff and she declined any dizziness or lightheadedness with them.  Patient's blood pressure here has been mildly high but nothing extreme.  The highest her blood pressure was was 147/114.  It was 137/86 and 128/51 before discharge.  Patient does have a follow-up appointment with her primary care doctor tomorrow.  I encouraged her to keep that appointment to follow-up with her.   Given her reassuring CT imaging, x-rays, and lab work, I do think patient safe for discharge home with close PCP follow-up.  Given that her headache improved, I think this is a complex migraine causing her photophobia with a frontal headache.  I doubt any meningitis as she does not have any nuchal rigidity or fevers.  CT imaging was unremarkable for any space-occupying lesion or any stroke.  I do not think any emergent MRI imaging needs to be done as she is deficit.  Doubt any PE given her reassuring D-dimer.  Patient may just run mildly tachycardic at her baseline.  Again, we will have her follow-up with her primary care doctor.    Given the patient improved with a headache cocktail, like again, I think this is more of a complex  migraine and I doubt any causes of central vertigo or any stroke or TIA.  Recommended Tylenol ibuprofen as needed for additional headaches.  Recommended she stay well-hydrated.  Discussed with her that the Decadron will make her blood sugar go up and do not need to be alarmed.She reports that she has seen a cardiologist before in the past as she was having palpitations after COVID vaccination.  Discussed with her to follow-up with her cardiologist for reevaluation.  We discussed return precautions for blood symptoms.  Patient verbalized understanding agrees the plan.  Patient is stable being discharged home in good condition.   Final Clinical Impression(s) / ED Diagnoses Final diagnoses:  Elevated blood pressure reading  Chest pain, unspecified type  Bad headache    Rx / DC Orders ED Discharge Orders     None         Julia Kline, Hershal Coria 07/26/22 2157    Carmin Muskrat, MD 07/26/22 2322

## 2022-07-26 NOTE — Telephone Encounter (Signed)
Per chart review tab pt is presently at [facility] ED. Sending note to Allayne Gitelman NP and [employer].

## 2022-07-26 NOTE — Telephone Encounter (Signed)
Noted.  Await emergency department notes.

## 2022-07-27 ENCOUNTER — Telehealth: Payer: Self-pay | Admitting: Primary Care

## 2022-07-27 ENCOUNTER — Encounter: Payer: Self-pay | Admitting: Primary Care

## 2022-07-27 ENCOUNTER — Other Ambulatory Visit: Payer: Self-pay | Admitting: Primary Care

## 2022-07-27 ENCOUNTER — Ambulatory Visit: Payer: 59 | Admitting: Primary Care

## 2022-07-27 VITALS — BP 142/98 | HR 105 | Temp 97.5°F | Ht 70.0 in | Wt 264.0 lb

## 2022-07-27 DIAGNOSIS — I10 Essential (primary) hypertension: Secondary | ICD-10-CM

## 2022-07-27 DIAGNOSIS — E1165 Type 2 diabetes mellitus with hyperglycemia: Secondary | ICD-10-CM | POA: Diagnosis not present

## 2022-07-27 MED ORDER — TIRZEPATIDE 2.5 MG/0.5ML ~~LOC~~ SOAJ: 2.5000 mg | SUBCUTANEOUS | 0 refills | Status: DC

## 2022-07-27 MED ORDER — VALSARTAN 80 MG PO TABS: 80.0000 mg | ORAL_TABLET | Freq: Every day | ORAL | 0 refills | Status: DC

## 2022-07-27 NOTE — Assessment & Plan Note (Addendum)
Numerous documented elevated blood pressure readings in the office and at home.  Start valsartan 80 mg daily for both blood pressure control and kidney protection against diabetes. Will plan to see her back in 2 weeks for blood pressure follow-up and BMP.  ED notes, labs, imaging reviewed.

## 2022-07-27 NOTE — Assessment & Plan Note (Signed)
Slight improvement, glucose readings remain above goal.  Continue Lantus 25 units daily, Humalog 15 units 3 times daily with meals, glipizide XL 5 mg daily. Add Mounjaro 2.5 mg weekly x 4 weeks, then increase to 5 mg weekly thereafter.  Would like to discontinue glipizide and eventually insulin.  We will work towards these goals.  We will see her back in late January 2024 for follow-up as scheduled.

## 2022-07-27 NOTE — Patient Instructions (Signed)
Start Mounjaro for diabetes and weight loss.  Start by injecting 2.5 mg into the skin once weekly x 4 weeks, then increase to 5 mg once weekly thereafter.  Notify me once you have used your last 2.5 mg pen.  Start valsartan 80 mg for blood pressure.  Take 1 tablet by mouth once daily.  Please schedule a follow up visit to meet back with me in 2-3 weeks for blood pressure check.   It was a pleasure to see you today!

## 2022-07-27 NOTE — Telephone Encounter (Signed)
Patient called in and stated that the prior auth for this prescription was denied. She was wanting to know why and what's the next steps. Please advise. Thank you!

## 2022-07-27 NOTE — Progress Notes (Signed)
Subjective:    Patient ID: Julia Kline, female    DOB: [birth year], 36 y.o.   MRN: [id number]  Hypertension Associated symptoms include headaches. Pertinent negatives include no chest pain or shortness of breath.    Julia Kline is a very pleasant 36 y.o. female with a history of migraines, perirectal abscess, type 2 diabetes, preeclampsia in prior pregnancy, obesity who presents today for ED follow-up.  She presented to [facility], ED yesterday with a 24-hour history of central and left-sided chest pain.  She took her blood pressure during her episode and noticed it was elevated in the 160 systolic.  She also noticed dizziness/lightheadedness, worse when moving around.  She contacted the fire department who checked her blood pressure which was 208/140.   During her stay in the ED she underwent CT head which was negative, EKG (no acute findings), D-dimer (negative), CBC (mild anemia), troponin (negative), chest x-ray (negative).  She was treated with a migraine cocktail which included Decadron, Reglan, Toradol and felt much better.  Her blood pressure ranged from 137/86, 128/51, 147/114.  She was discharged home with recommendations for PCP follow-up.  Since her ED visit she continues to experience a headache to the bilateral frontal lobes with radiation to her temporal region. This doesn't feel like her typical migraines. She has a family history of hypertension in her father.   She checks her glucose levels continuously which is running mid 100's fasting in the AM, but will run in the high 100's and low 200's in the afternoon, and low 100's in the evening.   BP Readings from Last 3 Encounters:  07/27/22 (!) 142/98  07/26/22 (!) 128/51  06/28/22 (!) 140/100      Review of Systems  Eyes:  Negative for visual disturbance.  Respiratory:  Negative for shortness of breath.   Cardiovascular:  Negative for chest pain.  Neurological:  Positive for light-headedness and  headaches.         Past Medical History:  Diagnosis Date   Acute conjunctivitis of right eye 07/10/2019   Acute pain of right knee 08/06/2019   Anemia 10/14/2019   Complicated UTI (urinary tract infection) 03/10/2021   COVID 2020   had pneumonia   COVID-19    Diabetes mellitus without complication (HCC)    Eczema    Hernia cerebri (HCC)    Hidradenitis axillaris 04/20/2020   Followed by [facility]. Last seen 05/26/2020: instructed to wash b/l with Hibiclens and take Doxycycline as prescribed and follow up in one month.    History of COVID-19 12/21/2019   History of gestational diabetes 05/02/2017   Normal pp testing 10/2017   History of severe pre-eclampsia 05/02/2017   Hypertension    on no medications   Impingement syndrome of left shoulder 02/16/2020   knee   Mastitis 06/18/2019   Migraine    PCOS (polycystic ovarian syndrome)    Pneumonia    Pregnancy induced hypertension    Skin lesions 09/12/2020    Social History   Socioeconomic History   Marital status: Married    Spouse name: Not on file   Number of children: Not on file   Years of education: Not on file   Highest education level: Not on file  Occupational History   Not on file  Tobacco Use   Smoking status: Never   Smokeless tobacco: Never  Vaping Use   Vaping Use: Never used  Substance and Sexual Activity   Alcohol use: No  Drug use: No   Sexual activity: Yes    Birth control/protection: None  Other Topics Concern   Not on file  Social History Narrative   Married.   2 children.   Works at [employer].   Social Determinants of Health   Financial Resource Strain: Not on file  Food Insecurity: Not on file  Transportation Needs: Not on file  Physical Activity: Not on file  Stress: Not on file  Social Connections: Not on file  Intimate Partner Violence: Not on file    Past Surgical History:  Procedure Laterality Date   CESAREAN SECTION  2017   Procedure: [place]; Surgeon: Lina Sar, MD; Location: [facility]CTION SUITE [facility]; Service: Obstetrics   CESAREAN SECTION N/A 10/02/2017   Procedure: CESAREAN SECTION;  Surgeon: Jonnie Kind, MD;  Location: [place];  Service: Obstetrics;  Laterality: N/A;   DILATION AND EVACUATION N/A 03/06/2022   Procedure: DILATATION AND EVACUATION;  Surgeon: Chancy Milroy, MD;  Location: [place];  Service: Gynecology;  Laterality: N/A;   I & D EXTREMITY Left 10/14/2020   Procedure: Irrigation and debridement left small and ring finger with repair of tendon;  Surgeon: Roseanne Kaufman, MD;  Location: [place];  Service: Orthopedics;  Laterality: Left;  1 hr Block with IV Sed   LAPAROSCOPIC OOPHERECTOMY Left    LEEP     OVARIAN CYST REMOVAL     TONSILLECTOMY     WISDOM TOOTH EXTRACTION      Family History  Problem Relation Age of Onset   Diabetes Mother    Migraines Mother    Diabetes Father    Hypertension Father    Hypertension Maternal Grandmother    Diabetes Maternal Grandmother    Diabetes Maternal Grandfather    COPD Maternal Grandfather    Diabetes Paternal Grandmother    Hypertension Paternal Grandmother    Diabetes Paternal Grandfather    Hypertension Paternal Grandfather    Heart attack Paternal Grandfather    Asthma Sister     Allergies  Allergen Reactions   Ancef [Cefazolin] Hives and Swelling    Facial and neck swelling; Had reaction after receiving fentanyl and ancef in the OR   Fentanyl Hives and Swelling    Facial and neck swelling; Had reaction after receiving fentanyl and ancef in the OR   Shrimp [Shellfish Allergy] Swelling and Other (See Comments)    Swelling of throat   Chlorhexidine Hives, Itching and Rash   Morphine And Related Rash   Pseudoephedrine Hcl Other (See Comments)    Light headed/ dizzy    Current Outpatient Medications on File Prior to Visit  Medication Sig Dispense Refill   Continuous Blood Gluc Sensor (FREESTYLE LIBRE 3 SENSOR) MISC Place 1  sensor on the skin every 14 days. Use to check glucose continuously. 6 each 3   glipiZIDE (GLUCOTROL XL) 5 MG 24 hr tablet Take 1 tablet (5 mg total) by mouth daily with breakfast. for diabetes. 90 tablet 0   insulin glargine (LANTUS SOLOSTAR) 100 UNIT/ML Solostar Pen Inject 25 Units into the skin daily. for diabetes. 30 mL 0   insulin lispro (HUMALOG KWIKPEN) 100 UNIT/ML KwikPen Inject 15 Units into the skin with breakfast, with lunch, and with evening meal. 15 mL 11   Insulin Pen Needle (TECHLITE PEN NEEDLES) 32G X 4 MM MISC USE NIGHTLY WITH INSULIN 100 each 1   No current facility-administered medications on file prior to visit.    BP (!) 142/98  Pulse (!) 105   Temp (!) 97.5 F (36.4 C) (Temporal)   Ht 5\' 10"  (1.778 m)   Wt 264 lb (119.7 kg)   LMP 07/12/2022   SpO2 99%   BMI 37.88 kg/m  Objective:   Physical Exam Cardiovascular:     Rate and Rhythm: Normal rate and regular rhythm.  Pulmonary:     Effort: Pulmonary effort is normal.     Breath sounds: Normal breath sounds.  Musculoskeletal:     Cervical back: Neck supple.  Skin:    General: Skin is warm and dry.           Assessment & Plan:   Problem List Items Addressed This Visit       Cardiovascular and Mediastinum   Primary hypertension    Numerous documented elevated blood pressure readings in the office and at home.  Start valsartan 80 mg daily for both blood pressure control and kidney protection against diabetes. Will plan to see her back in 2 weeks for blood pressure follow-up and BMP.  ED notes, labs, imaging reviewed.      Relevant Medications   valsartan (DIOVAN) 80 MG tablet     Endocrine   Type 2 diabetes mellitus with hyperglycemia (HCC) - Primary    Slight improvement, glucose readings remain above goal.  Continue Lantus 25 units daily, Humalog 15 units 3 times daily with meals, glipizide XL 5 mg daily. Add Mounjaro 2.5 mg weekly x 4 weeks, then increase to 5 mg weekly  thereafter.  Would like to discontinue glipizide and eventually insulin.  We will work towards these goals.  We will see her back in late January 2024 for follow-up as scheduled.      Relevant Medications   tirzepatide (MOUNJARO) 2.5 MG/0.5ML Pen   valsartan (DIOVAN) 80 MG tablet       Pleas Koch, NP

## 2022-07-30 ENCOUNTER — Other Ambulatory Visit (HOSPITAL_COMMUNITY): Payer: Self-pay

## 2022-07-30 ENCOUNTER — Telehealth: Payer: Self-pay

## 2022-07-30 NOTE — Telephone Encounter (Signed)
Pharmacy Patient Advocate Encounter   Received notification from [employer] that prior authorization for Mounjaro 2.5MG /0.5ML pen-injectors is required/requested.  Per Test Claim: Step therapy required   PA submitted on 07/30/22 to [employer] via CoverMyMeds Key [id number]  Status is pending

## 2022-07-30 NOTE — Telephone Encounter (Signed)
error 

## 2022-07-30 NOTE — Telephone Encounter (Signed)
Per [employer], this patient needs a PA completed for [facility].

## 2022-07-31 ENCOUNTER — Other Ambulatory Visit (HOSPITAL_COMMUNITY): Payer: Self-pay

## 2022-07-31 NOTE — Telephone Encounter (Signed)
Patient Advocate Encounter  Prior Authorization for Mounjaro 2.5MG /0.5ML pen-injectors has been approved.    PA#  [id number] Effective dates: 07/30/22 through 07/31/23

## 2022-08-01 NOTE — Telephone Encounter (Signed)
Noted  

## 2022-08-01 NOTE — Telephone Encounter (Signed)
Called and left voicemail, per dpr advising PA for Greggory Keen was approved.

## 2022-08-08 ENCOUNTER — Ambulatory Visit: Payer: 59 | Admitting: Primary Care

## 2022-08-16 ENCOUNTER — Encounter: Payer: Self-pay | Admitting: Primary Care

## 2022-08-16 ENCOUNTER — Ambulatory Visit (INDEPENDENT_AMBULATORY_CARE_PROVIDER_SITE_OTHER): Payer: 59 | Admitting: Primary Care

## 2022-08-16 VITALS — BP 132/88 | HR 105 | Temp 97.7°F | Ht 70.0 in | Wt 268.0 lb

## 2022-08-16 DIAGNOSIS — I10 Essential (primary) hypertension: Secondary | ICD-10-CM

## 2022-08-16 DIAGNOSIS — E1165 Type 2 diabetes mellitus with hyperglycemia: Secondary | ICD-10-CM | POA: Diagnosis not present

## 2022-08-16 DIAGNOSIS — Z23 Encounter for immunization: Secondary | ICD-10-CM

## 2022-08-16 LAB — BASIC METABOLIC PANEL
BUN: 10 mg/dL (ref 6–23)
CO2: 28 mEq/L (ref 19–32)
Calcium: 8.8 mg/dL (ref 8.4–10.5)
Chloride: 102 mEq/L (ref 96–112)
Creatinine, Ser: 0.68 mg/dL (ref 0.40–1.20)
GFR: 111.72 mL/min (ref >60.00)
Glucose, Bld: 210 mg/dL — ABNORMAL HIGH (ref 70–99)
Potassium: 4 mEq/L (ref 3.5–5.1)
Sodium: 137 mEq/L (ref 135–145)

## 2022-08-16 MED ORDER — VALSARTAN 160 MG PO TABS: 160.0000 mg | ORAL_TABLET | Freq: Every day | ORAL | 0 refills | Status: DC

## 2022-08-16 MED ORDER — TIRZEPATIDE 5 MG/0.5ML ~~LOC~~ SOAJ: 5.0000 mg | SUBCUTANEOUS | 0 refills | Status: DC

## 2022-08-16 NOTE — Assessment & Plan Note (Signed)
Tolerating Mounjaro well. Increase Mounjaro to 5 mg weekly.  Follow up in 1 month.

## 2022-08-16 NOTE — Progress Notes (Signed)
Subjective:    Patient ID: Julia Kline, female    DOB: 1986/03/02, 36 y.o.   MRN: [id number]  Hypertension Associated symptoms include headaches. Pertinent negatives include no chest pain or shortness of breath.    Julia Kline is a very pleasant 36 y.o. female with a history of hypertension, type 2 diabetes, perirectal abscess, migraines who presents today for follow up of hypertension.  She was last evaluated on 07/27/22 for ED follow up when blood pressure was noted to be elevated during numerous prior visits. Given her elevated BP readings we initiated valsartan 80 mg daily. She is here for follow up today.  Since her last visit she is compliant to her valsartan 80 mg daily. She has checked her BP at home a few times which is running 143/95, 138/86, 156/99. She continues to experience frontal headaches. This doesn't feel like a typical migraine.   She is also due for a refill of her Mounjaro. She denies nausea, GI upset with Mounjaro.   BP Readings from Last 3 Encounters:  08/16/22 132/88  07/27/22 (!) 142/98  07/26/22 (!) 128/51   Wt Readings from Last 3 Encounters:  08/16/22 268 lb (121.6 kg)  07/27/22 264 lb (119.7 kg)  07/26/22 250 lb (113.4 kg)      Review of Systems  Respiratory:  Negative for shortness of breath.   Cardiovascular:  Negative for chest pain.  Gastrointestinal:  Negative for abdominal pain.  Neurological:  Positive for headaches. Negative for dizziness.         Past Medical History:  Diagnosis Date   Acute conjunctivitis of right eye 07/10/2019   Acute pain of right knee 08/06/2019   Anemia 10/14/2019   Complicated UTI (urinary tract infection) 03/10/2021   COVID 2020   had pneumonia   COVID-19    Diabetes mellitus without complication (HCC)    Eczema    Hernia cerebri (HCC)    Hidradenitis axillaris 04/20/2020   Followed by [facility]. Last seen 05/26/2020: instructed to wash b/l with Hibiclens and take  Doxycycline as prescribed and follow up in one month.    History of COVID-19 12/21/2019   History of gestational diabetes 05/02/2017   Normal pp testing 10/2017   History of severe pre-eclampsia 05/02/2017   Hypertension    on no medications   Impingement syndrome of left shoulder 02/16/2020   knee   Mastitis 06/18/2019   Migraine    PCOS (polycystic ovarian syndrome)    Pneumonia    Pregnancy induced hypertension    Skin lesions 09/12/2020    Social History   Socioeconomic History   Marital status: Married    Spouse name: Not on file   Number of children: Not on file   Years of education: Not on file   Highest education level: Not on file  Occupational History   Not on file  Tobacco Use   Smoking status: Never   Smokeless tobacco: Never  Vaping Use   Vaping Use: Never used  Substance and Sexual Activity   Alcohol use: No   Drug use: No   Sexual activity: Yes    Birth control/protection: None  Other Topics Concern   Not on file  Social History Narrative   Married.   2 children.   Works at [employer].   Social Determinants of Health   Financial Resource Strain: Not on file  Food Insecurity: Not on file  Transportation Needs: Not on file  Physical Activity: Not on  file  Stress: Not on file  Social Connections: Not on file  Intimate Partner Violence: Not on file    Past Surgical History:  Procedure Laterality Date   CESAREAN SECTION  2017   Procedure: CESAREAN DELIVERY ONLY; Surgeon: Marian Sorrow, MD; Location: C-SECTION SUITE [facility]; Service: Obstetrics   CESAREAN SECTION N/A 10/02/2017   Procedure: CESAREAN SECTION;  Surgeon: Tilda Burrow, MD;  Location: [facility] BIRTHING SUITES;  Service: Obstetrics;  Laterality: N/A;   DILATION AND EVACUATION N/A 03/06/2022   Procedure: DILATATION AND EVACUATION;  Surgeon: Hermina Staggers, MD;  Location: [facility] OR;  Service: Gynecology;  Laterality: N/A;   I & D EXTREMITY Left 10/14/2020   Procedure: Irrigation and  debridement left small and ring finger with repair of tendon;  Surgeon: Dominica Severin, MD;  Location: [facility] OR;  Service: Orthopedics;  Laterality: Left;  1 hr Block with IV Sed   LAPAROSCOPIC OOPHERECTOMY Left    LEEP     OVARIAN CYST REMOVAL     TONSILLECTOMY     WISDOM TOOTH EXTRACTION      Family History  Problem Relation Age of Onset   Diabetes Mother    Migraines Mother    Diabetes Father    Hypertension Father    Hypertension Maternal Grandmother    Diabetes Maternal Grandmother    Diabetes Maternal Grandfather    COPD Maternal Grandfather    Diabetes Paternal Grandmother    Hypertension Paternal Grandmother    Diabetes Paternal Grandfather    Hypertension Paternal Grandfather    Heart attack Paternal Grandfather    Asthma Sister     Allergies  Allergen Reactions   Ancef [Cefazolin] Hives and Swelling    Facial and neck swelling; Had reaction after receiving fentanyl and ancef in the OR   Fentanyl Hives and Swelling    Facial and neck swelling; Had reaction after receiving fentanyl and ancef in the OR   Shrimp [Shellfish Allergy] Swelling and Other (See Comments)    Swelling of throat   Chlorhexidine Hives, Itching and Rash   Morphine And Related Rash   Pseudoephedrine Hcl Other (See Comments)    Light headed/ dizzy    Current Outpatient Medications on File Prior to Visit  Medication Sig Dispense Refill   Continuous Blood Gluc Sensor (FREESTYLE LIBRE 3 SENSOR) MISC Place 1 sensor on the skin every 14 days. Use to check glucose continuously. 6 each 3   glipiZIDE (GLUCOTROL XL) 5 MG 24 hr tablet Take 1 tablet (5 mg total) by mouth daily with breakfast. for diabetes. 90 tablet 0   insulin glargine (LANTUS SOLOSTAR) 100 UNIT/ML Solostar Pen Inject 25 Units into the skin daily. for diabetes. 30 mL 0   insulin lispro (HUMALOG KWIKPEN) 100 UNIT/ML KwikPen Inject 15 Units into the skin with breakfast, with lunch, and with evening meal. 15 mL 11   Insulin Pen Needle  (TECHLITE PEN NEEDLES) 32G X 4 MM MISC USE NIGHTLY WITH INSULIN 100 each 1   No current facility-administered medications on file prior to visit.    BP 132/88   Pulse (!) 105   Temp 97.7 F (36.5 C) (Temporal)   Ht 5\' 10"  (1.778 m)   Wt 268 lb (121.6 kg)   LMP 07/12/2022   SpO2 98%   BMI 38.45 kg/m  Objective:   Physical Exam Cardiovascular:     Rate and Rhythm: Normal rate and regular rhythm.  Pulmonary:     Effort: Pulmonary effort is normal.  Breath sounds: Normal breath sounds.  Musculoskeletal:     Cervical back: Neck supple.  Skin:    General: Skin is warm and dry.           Assessment & Plan:   Problem List Items Addressed This Visit       Cardiovascular and Mediastinum   Primary hypertension - Primary    Improved but not at goal, also symptomatic.  Increase valsartan to 160 mg daily. Checking BMP today.  She will continue to monitor home readings. Follow up in 1 month.      Relevant Medications   valsartan (DIOVAN) 160 MG tablet   Other Relevant Orders   Basic metabolic panel     Endocrine   Type 2 diabetes mellitus with hyperglycemia (HCC)    Tolerating Mounjaro well. Increase Mounjaro to 5 mg weekly.  Follow up in 1 month.      Relevant Medications   tirzepatide (MOUNJARO) 5 MG/0.5ML Pen   valsartan (DIOVAN) 160 MG tablet       Doreene Nest, NP

## 2022-08-16 NOTE — Assessment & Plan Note (Signed)
Improved but not at goal, also symptomatic.  Increase valsartan to 160 mg daily. Checking BMP today.  She will continue to monitor home readings. Follow up in 1 month.

## 2022-08-16 NOTE — Patient Instructions (Signed)
We increased the dose of your valsartan to 160 mg for blood pressure.   We increased the dose of your Mounjaro to 5 mg weekly for diabetes.  Continue to monitor your blood pressure.   We will see you in 1 month!  It was a pleasure to see you today!

## 2022-08-18 ENCOUNTER — Other Ambulatory Visit: Payer: Self-pay | Admitting: Primary Care

## 2022-08-18 DIAGNOSIS — I10 Essential (primary) hypertension: Secondary | ICD-10-CM

## 2022-09-14 ENCOUNTER — Ambulatory Visit (INDEPENDENT_AMBULATORY_CARE_PROVIDER_SITE_OTHER): Payer: 59 | Admitting: Primary Care

## 2022-09-14 ENCOUNTER — Encounter: Payer: Self-pay | Admitting: Primary Care

## 2022-09-14 VITALS — BP 128/90 | HR 93 | Temp 97.3°F | Ht 70.0 in | Wt 269.0 lb

## 2022-09-14 DIAGNOSIS — I10 Essential (primary) hypertension: Secondary | ICD-10-CM

## 2022-09-14 DIAGNOSIS — E1165 Type 2 diabetes mellitus with hyperglycemia: Secondary | ICD-10-CM | POA: Diagnosis not present

## 2022-09-14 LAB — POCT GLYCOSYLATED HEMOGLOBIN (HGB A1C): Hemoglobin A1C: 7.4 % — AB (ref 4.0–5.6)

## 2022-09-14 MED ORDER — AMLODIPINE BESYLATE 2.5 MG PO TABS: 2.5000 mg | ORAL_TABLET | Freq: Every day | ORAL | 0 refills | Status: DC

## 2022-09-14 MED ORDER — TIRZEPATIDE 7.5 MG/0.5ML ~~LOC~~ SOAJ: 7.5000 mg | SUBCUTANEOUS | 0 refills | Status: DC

## 2022-09-14 NOTE — Assessment & Plan Note (Signed)
Improving with A1C today of 7.4!  Discussed options.  Increase Mounjaro to 7.5 mg weekly. Reduce Lantus to 20 units daily. Continue Humalog 15 units TID. Continue Glipizide XL 5 mg daily.  Follow up in 3 months. She will update sooner if she is seeing glucose readings more frequently below 60.

## 2022-09-14 NOTE — Assessment & Plan Note (Signed)
Above goal today, even on recheck. Home reading above goal.  Continue valsartan 160 mg daily. Add amlodipine 2.5 mg daily.  I've asked her to monitor at home and send BP readings in 2 weeks via MyChart.

## 2022-09-14 NOTE — Progress Notes (Signed)
Subjective:    Patient ID: Julia Kline, female    DOB: 17-Aug-1986, 37 y.o.   MRN: [id number]  HPI  Julia Kline is a very pleasant 37 y.o. female with a history of hypertension, type 2 diabetes, obesity who presents today for follow up of diabetes and hypertension.  1) Type 2 Diabetes:  Current medications include: Mounjaro 5 mg weekly, Glipizide XL 5 mg daily, Lantus 25 units daily, Humalog 15 units TID with meals.   She is checking her blood glucose continuously and is getting readings ranging 80's-mid 100's. She does experience drops in the 50's 2-3 times in the last 1 month, mostly if she forgets to eat.  Last A1C: 8.3 in October 2023, 7.4 today Last Eye Exam: UTD Last Foot Exam: Due Pneumonia Vaccination: Never completed Urine Microalbumin: UTD Statin:None. Childbearing age.  Dietary changes since last visit: Decreased appetite with Mounjaro. She has been decreasing carbs and is watching calorie intake. She's eating more salads.    Exercise: None.   2) Essential Hypertension: Currently managed on valsartan 160 mg daily. She denies chest pain. She does experience daily headaches that occur daily. She will also experience "lightening" sensation that shoot through her eyes. Headaches do not feel like typical migraines.   She does not check BP regularly at home, but she did check earlier this week which was 143/97.   BP Readings from Last 3 Encounters:  09/14/22 (!) 128/90  08/16/22 132/88  07/27/22 (!) 142/98   Wt Readings from Last 3 Encounters:  09/14/22 269 lb (122 kg)  08/16/22 268 lb (121.6 kg)  07/27/22 264 lb (119.7 kg)       Review of Systems  Respiratory:  Negative for shortness of breath.   Cardiovascular:  Negative for chest pain.  Neurological:  Positive for headaches. Negative for numbness.         Past Medical History:  Diagnosis Date   Acute conjunctivitis of right eye 07/10/2019   Acute pain of right knee 08/06/2019    Anemia 70/62/3762   Complicated UTI (urinary tract infection) 03/10/2021   COVID 2020   had pneumonia   COVID-19    Diabetes mellitus without complication ([place])    Eczema    Hernia cerebri ([place])    Hidradenitis axillaris 04/20/2020   Followed by [facility]. Last seen 05/26/2020: instructed to wash b/l with Hibiclens and take Doxycycline as prescribed and follow up in one month.    History of COVID-19 12/21/2019   History of gestational diabetes 05/02/2017   Normal pp testing 10/2017   History of severe pre-eclampsia 05/02/2017   Hypertension    on no medications   Impingement syndrome of left shoulder 02/16/2020   knee   Mastitis 06/18/2019   Migraine    PCOS (polycystic ovarian syndrome)    Pneumonia    Pregnancy induced hypertension    Skin lesions 09/12/2020    Social History   Socioeconomic History   Marital status: Married    Spouse name: Not on file   Number of children: Not on file   Years of education: Not on file   Highest education level: Not on file  Occupational History   Not on file  Tobacco Use   Smoking status: Never   Smokeless tobacco: Never  Vaping Use   Vaping Use: Never used  Substance and Sexual Activity   Alcohol use: No   Drug use: No   Sexual activity: Yes    Birth control/protection: None  Other Topics Concern   Not on file  Social History Narrative   Married.   2 children.   Works at [employer].   Social Determinants of Health   Financial Resource Strain: Not on file  Food Insecurity: Not on file  Transportation Needs: Not on file  Physical Activity: Not on file  Stress: Not on file  Social Connections: Not on file  Intimate Partner Violence: Not on file    Past Surgical History:  Procedure Laterality Date   CESAREAN SECTION  2017   Procedure: CESAREAN DELIVERY ONLY; Surgeon: Marian Sorrow, MD; Location: C-SECTION SUITE [facility]; Service: Obstetrics   CESAREAN SECTION N/A 10/02/2017   Procedure:  CESAREAN SECTION;  Surgeon: Tilda Burrow, MD;  Location: [facility] BIRTHING SUITES;  Service: Obstetrics;  Laterality: N/A;   DILATION AND EVACUATION N/A 03/06/2022   Procedure: DILATATION AND EVACUATION;  Surgeon: Hermina Staggers, MD;  Location: [facility] OR;  Service: Gynecology;  Laterality: N/A;   I & D EXTREMITY Left 10/14/2020   Procedure: Irrigation and debridement left small and ring finger with repair of tendon;  Surgeon: Dominica Severin, MD;  Location: [facility] OR;  Service: Orthopedics;  Laterality: Left;  1 hr Block with IV Sed   LAPAROSCOPIC OOPHERECTOMY Left    LEEP     OVARIAN CYST REMOVAL     TONSILLECTOMY     WISDOM TOOTH EXTRACTION      Family History  Problem Relation Age of Onset   Diabetes Mother    Migraines Mother    Diabetes Father    Hypertension Father    Hypertension Maternal Grandmother    Diabetes Maternal Grandmother    Diabetes Maternal Grandfather    COPD Maternal Grandfather    Diabetes Paternal Grandmother    Hypertension Paternal Grandmother    Diabetes Paternal Grandfather    Hypertension Paternal Grandfather    Heart attack Paternal Grandfather    Asthma Sister     Allergies  Allergen Reactions   Ancef [Cefazolin] Hives and Swelling    Facial and neck swelling; Had reaction after receiving fentanyl and ancef in the OR   Fentanyl Hives and Swelling    Facial and neck swelling; Had reaction after receiving fentanyl and ancef in the OR   Shrimp [Shellfish Allergy] Swelling and Other (See Comments)    Swelling of throat   Chlorhexidine Hives, Itching and Rash   Morphine And Related Rash   Pseudoephedrine Hcl Other (See Comments)    Light headed/ dizzy    Current Outpatient Medications on File Prior to Visit  Medication Sig Dispense Refill   Continuous Blood Gluc Sensor (FREESTYLE LIBRE 3 SENSOR) MISC Place 1 sensor on the skin every 14 days. Use to check glucose continuously. 6 each 3   glipiZIDE (GLUCOTROL XL) 5 MG 24 hr tablet Take 1 tablet (5 mg  total) by mouth daily with breakfast. for diabetes. 90 tablet 0   insulin glargine (LANTUS SOLOSTAR) 100 UNIT/ML Solostar Pen Inject 25 Units into the skin daily. for diabetes. 30 mL 0   insulin lispro (HUMALOG KWIKPEN) 100 UNIT/ML KwikPen Inject 15 Units into the skin with breakfast, with lunch, and with evening meal. 15 mL 11   Insulin Pen Needle (TECHLITE PEN NEEDLES) 32G X 4 MM MISC USE NIGHTLY WITH INSULIN 100 each 1   valsartan (DIOVAN) 160 MG tablet Take 1 tablet (160 mg total) by mouth daily. for blood pressure. 90 tablet 0   No current facility-administered medications on file prior to  visit.    BP (!) 128/90   Pulse 93   Temp (!) 97.3 F (36.3 C) (Temporal)   Ht 5\' 10"  (1.778 m)   Wt 269 lb (122 kg)   LMP 07/13/2022 (Approximate)   SpO2 95%   BMI 38.60 kg/m  Objective:   Physical Exam Cardiovascular:     Rate and Rhythm: Normal rate and regular rhythm.  Pulmonary:     Effort: Pulmonary effort is normal.     Breath sounds: Normal breath sounds.  Musculoskeletal:     Cervical back: Neck supple.  Skin:    General: Skin is warm and dry.           Assessment & Plan:  Type 2 diabetes mellitus with hyperglycemia, without long-term current use of insulin (HCC) Assessment & Plan: Improving with A1C today of 7.4!  Discussed options.  Increase Mounjaro to 7.5 mg weekly. Reduce Lantus to 20 units daily. Continue Humalog 15 units TID. Continue Glipizide XL 5 mg daily.  Follow up in 3 months. She will update sooner if she is seeing glucose readings more frequently below 60.  Orders: -     POCT glycosylated hemoglobin (Hb A1C) -     Tirzepatide; Inject 7.5 mg into the skin once a week. for diabetes.  Dispense: 6 mL; Refill: 0  Primary hypertension Assessment & Plan: Above goal today, even on recheck. Home reading above goal.  Continue valsartan 160 mg daily. Add amlodipine 2.5 mg daily.  I've asked her to monitor at home and send BP readings in 2 weeks via  MyChart.   Orders: -     amLODIPine Besylate; Take 1 tablet (2.5 mg total) by mouth daily. for blood pressure.  Dispense: 90 tablet; Refill: 0        Pleas Koch, NP

## 2022-09-14 NOTE — Patient Instructions (Addendum)
We increased your Mounjaro dose to 7.5 mg weekly.   Reduce your Lantus to 20 units daily once you start the increased dose of Mounjaro.   Continue Glipizide and Humalog for diabetes.   Start amlodipine 2.5 mg daily for blood pressure.  Continue valsartan 160 mg daily for blood pressure.  Send me blood pressure readings via MyChart in 2 weeks.  Please schedule a follow up visit for 3 months.  It was a pleasure to see you today!

## 2022-09-17 ENCOUNTER — Other Ambulatory Visit: Payer: Self-pay | Admitting: Primary Care

## 2022-09-17 DIAGNOSIS — E1165 Type 2 diabetes mellitus with hyperglycemia: Secondary | ICD-10-CM

## 2022-09-20 ENCOUNTER — Ambulatory Visit: Payer: 59 | Admitting: Obstetrics and Gynecology

## 2022-09-20 VITALS — BP 135/92 | HR 98 | Wt 264.4 lb

## 2022-09-20 DIAGNOSIS — Z3202 Encounter for pregnancy test, result negative: Secondary | ICD-10-CM

## 2022-09-20 DIAGNOSIS — N912 Amenorrhea, unspecified: Secondary | ICD-10-CM

## 2022-09-20 NOTE — Progress Notes (Signed)
CC: No Cycle since 11.24.23, Pt stating that cycle on 11.24.23 was normal in flow just later than usual.   Last pap: 2023   Negative UPT 12.7.23

## 2022-09-21 LAB — BETA HCG QUANT (REF LAB): hCG Quant: <1 m[IU]/mL

## 2022-09-21 LAB — TSH RFX ON ABNORMAL TO FREE T4: TSH: 0.736 u[IU]/mL (ref 0.450–4.500)

## 2022-09-21 LAB — POCT URINE PREGNANCY: Preg Test, Ur: NEGATIVE

## 2022-09-21 NOTE — Progress Notes (Signed)
Obstetrics and Gynecology Visit Return Patient Evaluation  Appointment Date: 09/20/2022  Primary Care Provider: Pleas Koch  OBGYN Clinic: [facility]  Chief Complaint: no period since 07/12/22  History of Present Illness:  Julia Kline is a 37 y.o. with above CC. Patient had an early miscarriage and d&c in mid July 2023; she states her periods were qmonth, regular prior to this. She didn't have a period until September and had a regular 3-4d period, as well as in November, which was LMP 11/23 and lasted for 3-4 days but non since. She has taken home UPTs and were negative; she is not on anything for period or birth control.   Review of Systems: as noted in the History of Present Illness.  Patient Active Problem List   Diagnosis Date Noted   Perirectal abscess 06/26/2022   Class 2 severe obesity due to excess calories with serious comorbidity and body mass index (BMI) of 38.0 to 38.9 in adult [facility]) 06/14/2022   Ovalocytosis ([place]) 03/03/2022   History of pre-eclampsia in prior pregnancy, currently pregnant 02/15/2022   Type 2 diabetes mellitus with hyperglycemia ([place]) 12/21/2019   Primary hypertension 10/13/2019   Migraines 04/01/2018   Medications:  Julia Kline had no medications administered during this visit. Current Outpatient Medications  Medication Sig Dispense Refill   amLODipine (NORVASC) 2.5 MG tablet Take 1 tablet (2.5 mg total) by mouth daily. for blood pressure. 90 tablet 0   Continuous Blood Gluc Sensor (FREESTYLE LIBRE 3 SENSOR) MISC Place 1 sensor on the skin every 14 days. Use to check glucose continuously. 6 each 3   glipiZIDE (GLUCOTROL XL) 5 MG 24 hr tablet TAKE 1 TABLET(5 MG) BY MOUTH DAILY WITH BREAKFAST FOR DIABETES 90 tablet 1   insulin glargine (LANTUS SOLOSTAR) 100 UNIT/ML Solostar Pen Inject 25 Units into the skin daily. for diabetes. 30 mL 0   insulin lispro (HUMALOG KWIKPEN) 100 UNIT/ML KwikPen Inject 15  Units into the skin with breakfast, with lunch, and with evening meal. 15 mL 11   Insulin Pen Needle (TECHLITE PEN NEEDLES) 32G X 4 MM MISC USE NIGHTLY WITH INSULIN 100 each 1   tirzepatide (MOUNJARO) 7.5 MG/0.5ML Pen Inject 7.5 mg into the skin once a week. for diabetes. 6 mL 0   valsartan (DIOVAN) 160 MG tablet Take 1 tablet (160 mg total) by mouth daily. for blood pressure. 90 tablet 0   No current facility-administered medications for this visit.    Allergies: is allergic to ancef [cefazolin], fentanyl, shrimp [shellfish allergy], chlorhexidine, morphine and related, and pseudoephedrine hcl.  Physical Exam:  BP (!) 135/92   Pulse 98   Wt 264 lb 6.4 oz (119.9 kg)   LMP 07/13/2022 (Approximate)   BMI 37.94 kg/m  Body mass index is 37.94 kg/m. General appearance: Well nourished, well developed female in no acute distress.  Neuro/Psych:  Normal mood and affect.    Labs: UPT negative  Assessment: patient stable  Plan:  1. Amenorrhea Will get formal labs today. She is wanting to get healthier before even potentially trying again for pregnancy, which I agree with. I told her the Valsartan is something that is known to cause birth defects and the Darcel Bayley is unknown but is recommended to not be on for conception/pregnancy. I also told her the importance of a regularly monthly period if you're not on some type of hormone. Given this, I told her I recommend progestin only pills, such as Slynd or Camila, with depo  provera and nexplanon but next available preferred options. If not wanting to do these, then condoms and cyclic provera to induce a withdraw bleed is recommended.  Patient to consider options and let us know.  - POCT urine pregnancy - TSH Rfx on Abnormal to Free T4 - Beta hCG quant (ref lab)   RTC: PRN  Durene Romans MD Attending [facility])

## 2022-09-24 ENCOUNTER — Other Ambulatory Visit: Payer: Self-pay | Admitting: *Deleted

## 2022-09-24 ENCOUNTER — Encounter: Payer: Self-pay | Admitting: Obstetrics and Gynecology

## 2022-09-24 MED ORDER — SLYND 4 MG PO TABS: 4.0000 mg | ORAL_TABLET | Freq: Every day | ORAL | 2 refills | Status: DC

## 2022-09-25 ENCOUNTER — Telehealth: Payer: Self-pay | Admitting: *Deleted

## 2022-09-25 MED ORDER — NORETHINDRONE 0.35 MG PO TABS: 1.0000 | ORAL_TABLET | Freq: Every day | ORAL | 6 refills | Status: DC

## 2022-09-25 NOTE — Telephone Encounter (Signed)
Pt called to let us know that Slynd was not covered and would like to get something that is.   Per Dr Ilda Basset, can send in [place].   Pt informed

## 2022-10-02 LAB — HM DIABETES EYE EXAM

## 2022-10-11 ENCOUNTER — Telehealth: Payer: Self-pay

## 2022-10-11 ENCOUNTER — Ambulatory Visit: Payer: 59 | Admitting: Primary Care

## 2022-10-11 ENCOUNTER — Encounter: Payer: Self-pay | Admitting: Primary Care

## 2022-10-11 VITALS — BP 115/79 | HR 91 | Temp 97.8°F | Ht 70.0 in | Wt 264.1 lb

## 2022-10-11 DIAGNOSIS — I10 Essential (primary) hypertension: Secondary | ICD-10-CM | POA: Diagnosis not present

## 2022-10-11 DIAGNOSIS — E1165 Type 2 diabetes mellitus with hyperglycemia: Secondary | ICD-10-CM | POA: Diagnosis not present

## 2022-10-11 DIAGNOSIS — R0789 Other chest pain: Secondary | ICD-10-CM | POA: Diagnosis not present

## 2022-10-11 NOTE — Telephone Encounter (Signed)
[name] with access nurse called office; pt last seen by Gentry Fitz NP on 09/14/2002.  last night BP 150/100; last night pt had H/A and tightness in chest. Pt said this morning at 5:30 AM BP 140/89; no BP reading since then and pt is presently at work and cannot leave because she is only one there.  Pt denies h/a today but feels like someone is sitting on chest. [name] said after advising pt to go to ED since feel like something is sitting on her chest pt said felt earlier like something sitting on chest. Pt wanted note sent to [facility] to see if any med changes.This is end of note from access nurse;  User: Saverio Danker, RN Date/Time Eilene Ghazi Time): 10/11/2022 9:04:07 AM Caller stated that she felt like something was sitting on her chest, advised her to go to the ED. When I gave the ED outcome, she said that she is at work and can not leave now. States that she was just calling the nurse line to know if she needs to up her meds. Caller then denies chest tightness at this time. States that in the night she had head pressure, B/P was 150/100, then chest tightness, last reading 140/89 at 5:30 am. Caller advised that she will go to the ED later, just can not go know. Called the backline, per day triage instructions, gave report to [facility], she will let NP Clark know.  Sending note to Gentry Fitz NP, Carlis Abbott pool and will teams [facility] CMA.    [place] Night - Client TELEPHONE ADVICE RECORD AccessNurse Patient Name: Julia Kline Gender: Female DOB: March 08, 1986 Age: 37 Y 11 M 28 D Return Phone Number: [phone] (Primary), [phone] (Secondary) Address: City/ State/ Zip: [place] [address] Client [facility] Night - Client Client Site [place] - Night Provider Alma Friendly - NP Contact Type Call Who Is Calling Patient / Member / Family / Caregiver Call Type Triage / Clinical Relationship To Patient Self Return Phone  Number [phone] (Primary) Chief Complaint Blood Pressure High Reason for Call Symptomatic / Request for [place] states she was given blood pressure medication. Last night her blood pressure was 150/100. Translation No Nurse Assessment Nurse: Rolin Barry, RN, [name] Date/Time (Eastern Time): 10/11/2022 8:50:17 AM Confirm and document reason for call. If symptomatic, describe symptoms. ---Caller states she was given blood pressure medication. Last night her blood pressure was 150/100. Caller was started on High Blood pressure meds in December, another med was added in January. Callers B/P was 140/89 at 5:30 am. Does the patient have any new or worsening symptoms? ---Yes Will a triage be completed? ---Yes Related visit to physician within the last 2 weeks? ---No Does the PT have any chronic conditions? (i.e. diabetes, asthma, this includes High risk factors for pregnancy, etc.) ---Yes List chronic conditions. ---HTN Diabetes , type 2 , last BS is 130 with freestyle Is the patient pregnant or possibly pregnant? (Ask all females between the ages of 58-55) ---No Is this a behavioral health or substance abuse call? ---No Guidelines Guideline Title Affirmed Question Affirmed Notes Nurse Date/Time (Eastern Time) Blood Pressure - High [id number] Systolic BP >= 0000000 OR Diastolic >= 123XX123 AND [id number] cardiac (e.g., breathing difficulty, chest pain) or neurologic Deaton, RN, [name] 10/11/2022 8:52:47 AM PLEASE NOTE: All timestamps contained within this report are represented as [place] Standard Time. CONFIDENTIALTY NOTICE: This fax transmission is intended only for the addressee.  It contains information that is legally privileged, confidential or otherwise protected from use or disclosure. If you are not the intended recipient, you are strictly prohibited from reviewing, disclosing, copying using or disseminating any of this information or taking any action in reliance  on or regarding this information. If you have received this fax in error, please notify us immediately by telephone so that we can arrange for its return to [place]. Phone: [phone], Toll-Free: [phone], Fax: [phone] Page: 2 of 2 Call Id: [id number] Guidelines Guideline Title Affirmed Question Affirmed Notes Nurse Date/Time Eilene Ghazi Time) symptoms (e.g., new-onset blurred or double vision, unsteady gait) Disp. Time Eilene Ghazi Time) Disposition Final User 10/11/2022 8:55:40 AM Go to ED Now Yes Deaton, RN, [name] Final Disposition 10/11/2022 8:55:40 AM Go to ED Now Yes Deaton, RN, Cindee Lame Disagree/Comply Disagree Caller Understands Yes PreDisposition Did not know what to do Care Advice Given Per Guideline GO TO ED NOW: * You need to be seen in the Emergency Department. * Go to the ED at ___________ [place] now. Drive carefully. NOTE TO TRIAGER - DRIVING: * Another adult should drive. * Patient should not delay going to the emergency department. CARE ADVICE given per High Blood Pressure (Adult) guideline. CALL EMS 911 IF: * Passes out or faints * Becomes confused * Becomes too weak to stand * You become worse * If immediate transportation is not available via car, rideshare (e.g., Lyft, Uber), or taxi, then the patient should be instructed to call EMS-911. Comments User: Saverio Danker, RN Date/Time Eilene Ghazi Time): 10/11/2022 9:04:07 AM Caller stated that she felt like something was sitting on her chest, advised her to go to the ED. When I gave the ED outcome, she said that she is at work and can not leave now. States that she was just calling the nurse line to know if she needs to up her meds. Caller then denies chest tightness at this time. States that in the night she had head pressure, bB/P was 150/100, then chest tightness, last reading 140/89 at 5:30 am. Caller advised that she will go to the ED later, just can not go know. Called the backline, per day triage  instructions, gave report to [facility], she will let NP Clark know. Referrals GO TO FACILITY REFUSE

## 2022-10-11 NOTE — Progress Notes (Addendum)
Subjective:    Patient ID: Julia Kline, female    DOB: 1986/04/21, 37 y.o.   MRN: [id number]  HPI  Julia Kline is a very pleasant 37 y.o. female with a history of hypertension, type 2 diabetes, obesity who presents today for evaluation of hypertension and chest pressure.  Currently managed on valsartan 160 mg daily and amlodipine 2.5 mg daily.  She contacted our access nurse triage line with reports of headache and chest tightness last night.  She checked her blood pressure at home which was 150/100.  She rechecked her blood pressure this morning which was 140/89.  Given her symptoms she was asked to come in for evaluation.  Today she discusses that her symptoms yesterday began with a drop in her blood sugar "under 90" with dizziness and chest pressure. She ate some candy and checked her BP which was 150/100. This morning she woke up with mild chest pressure and ongoing head pressure. She took her medications and drank some water and felt better.   Her head pressure began about 2 weeks ago which is located to the bilateral, mid occipital lobe with radiation down to her bilateral neck. These symptoms are intermittent. She does not notice the head pressure now.   Her recent glucose readings have been ranging in the 120's mostly. She has not checked her blood pressure outside of yesterday.  She did begin taking norethindrone 0.35 mg tablets daily per GYN about 2 weeks ago.  Other than her birth control, no other changes have been made to her regimen.  She has been under a lot of stress, but her stress is no worse than typical.  Her headaches do not feel like her typical migraine.  BP Readings from Last 3 Encounters:  10/11/22 115/79  09/20/22 (!) 135/92  09/14/22 (!) 128/90      Review of Systems  Constitutional:  Negative for fever.  Respiratory:  Negative for shortness of breath.   Cardiovascular:  Positive for chest pain.  Musculoskeletal:  Positive for myalgias.   Neurological:  Positive for headaches. Negative for syncope and numbness.         Past Medical History:  Diagnosis Date   Acute conjunctivitis of right eye 07/10/2019   Acute pain of right knee 08/06/2019   Anemia [id number]   Complicated UTI (urinary tract infection) 03/10/2021   COVID 2020   had pneumonia   COVID-19    Diabetes mellitus without complication ([place])    Eczema    Hernia cerebri ([place])    Hidradenitis axillaris 04/20/2020   Followed by [facility]. Last seen 05/26/2020: instructed to wash b/l with Hibiclens and take Doxycycline as prescribed and follow up in one month.    History of COVID-19 12/21/2019   History of gestational diabetes 05/02/2017   Normal pp testing 10/2017   History of severe pre-eclampsia 05/02/2017   Hypertension    on no medications   Impingement syndrome of left shoulder 02/16/2020   knee   Mastitis 06/18/2019   Migraine    PCOS (polycystic ovarian syndrome)    Pneumonia    Pregnancy induced hypertension    Skin lesions 09/12/2020    Social History   Socioeconomic History   Marital status: Married    Spouse name: Not on file   Number of children: Not on file   Years of education: Not on file   Highest education level: Not on file  Occupational History   Not on file  Tobacco Use  Smoking status: Never   Smokeless tobacco: Never  Vaping Use   Vaping Use: Never used  Substance and Sexual Activity   Alcohol use: No   Drug use: No   Sexual activity: Yes    Birth control/protection: None  Other Topics Concern   Not on file  Social History Narrative   Married.   2 children.   Works at [employer].   Social Determinants of Health   Financial Resource Strain: Not on file  Food Insecurity: Not on file  Transportation Needs: Not on file  Physical Activity: Not on file  Stress: Not on file  Social Connections: Not on file  Intimate Partner Violence: Not on file    Past Surgical History:  Procedure  Laterality Date   CESAREAN SECTION  2017   Procedure: [place]; Surgeon: Lina Sar, MD; [facility]; Service: Obstetrics   CESAREAN SECTION N/A 10/02/2017   Procedure: CESAREAN SECTION;  Surgeon: Jonnie Kind, MD;  Location: [place];  Service: Obstetrics;  Laterality: N/A;   DILATION AND EVACUATION N/A 03/06/2022   Procedure: DILATATION AND EVACUATION;  Surgeon: Chancy Milroy, MD;  Location: [place];  Service: Gynecology;  Laterality: N/A;   I & D EXTREMITY Left 10/14/2020   Procedure: Irrigation and debridement left small and ring finger with repair of tendon;  Surgeon: Roseanne Kaufman, MD;  Location: [place];  Service: Orthopedics;  Laterality: Left;  1 hr Block with IV Sed   LAPAROSCOPIC OOPHERECTOMY Left    LEEP     OVARIAN CYST REMOVAL     TONSILLECTOMY     WISDOM TOOTH EXTRACTION      Family History  Problem Relation Age of Onset   Diabetes Mother    Migraines Mother    Diabetes Father    Hypertension Father    Hypertension Maternal Grandmother    Diabetes Maternal Grandmother    Diabetes Maternal Grandfather    COPD Maternal Grandfather    Diabetes Paternal Grandmother    Hypertension Paternal Grandmother    Diabetes Paternal Grandfather    Hypertension Paternal Grandfather    Heart attack Paternal Grandfather    Asthma Sister     Allergies  Allergen Reactions   Ancef [Cefazolin] Hives and Swelling    Facial and neck swelling; Had reaction after receiving fentanyl and ancef in the OR   Fentanyl Hives and Swelling    Facial and neck swelling; Had reaction after receiving fentanyl and ancef in the OR   Shrimp [Shellfish Allergy] Swelling and Other (See Comments)    Swelling of throat   Chlorhexidine Hives, Itching and Rash   Morphine And Related Rash   Pseudoephedrine Hcl Other (See Comments)    Light headed/ dizzy    Current Outpatient Medications on File Prior to Visit  Medication Sig Dispense Refill    amLODipine (NORVASC) 2.5 MG tablet Take 1 tablet (2.5 mg total) by mouth daily. for blood pressure. 90 tablet 0   Continuous Blood Gluc Sensor (FREESTYLE LIBRE 3 SENSOR) MISC Place 1 sensor on the skin every 14 days. Use to check glucose continuously. 6 each 3   glipiZIDE (GLUCOTROL XL) 5 MG 24 hr tablet TAKE 1 TABLET(5 MG) BY MOUTH DAILY WITH BREAKFAST FOR DIABETES 90 tablet 1   insulin glargine (LANTUS SOLOSTAR) 100 UNIT/ML Solostar Pen Inject 25 Units into the skin daily. for diabetes. 30 mL 0   insulin lispro (HUMALOG KWIKPEN) 100 UNIT/ML KwikPen Inject 15 Units into the  skin with breakfast, with lunch, and with evening meal. 15 mL 11   Insulin Pen Needle (TECHLITE PEN NEEDLES) 32G X 4 MM MISC USE NIGHTLY WITH INSULIN 100 each 1   norethindrone (MICRONOR) 0.35 MG tablet Take 1 tablet (0.35 mg total) by mouth daily. 84 tablet 6   tirzepatide (MOUNJARO) 7.5 MG/0.5ML Pen Inject 7.5 mg into the skin once a week. for diabetes. 6 mL 0   valsartan (DIOVAN) 160 MG tablet Take 1 tablet (160 mg total) by mouth daily. for blood pressure. 90 tablet 0   No current facility-administered medications on file prior to visit.    BP 115/79   Pulse 91   Temp 97.8 F (36.6 C) (Temporal)   Ht 5' 10"$  (1.778 m)   Wt 264 lb 2 oz (119.8 kg)   LMP 07/13/2022 (Approximate)   SpO2 97%   BMI 37.90 kg/m  Objective:   Physical Exam Eyes:     Extraocular Movements: Extraocular movements intact.  Cardiovascular:     Rate and Rhythm: Normal rate and regular rhythm.  Pulmonary:     Effort: Pulmonary effort is normal.     Breath sounds: Normal breath sounds.  Musculoskeletal:     Cervical back: Neck supple.  Skin:    General: Skin is warm and dry.  Neurological:     Mental Status: She is oriented to person, place, and time.     Cranial Nerves: No cranial nerve deficit.     Coordination: Coordination normal.           Assessment & Plan:  Primary hypertension Assessment & Plan: Well-controlled  today.  Continue amlodipine 2.5 mg daily and valsartan 160 mg daily.  It is likely that her birth control pills are contributing to her symptoms of headaches/chest pressure as she began birth control around the time of symptom onset.  She does not exhibit symptoms of PE.  Neuro exam grossly unremarkable. Glucose levels under excellent control.  Checking CBC and BMP today. We discussed that if her symptoms persist to consider discontinuation of her birth control and notify GYN.  Orders: -     EKG 12-Lead  Chest pressure Assessment & Plan: It is likely that her birth control pills are contributing to her symptoms of headaches/chest pressure as she began birth control around the time of symptom onset.  She does not exhibit symptoms of PE.  Neuro exam grossly unremarkable. Glucose levels under excellent control.  Checking CBC and BMP today. We discussed that if her symptoms persist to consider discontinuation of her birth control and notify GYN.  ECG today with NSR with rate of 89. No PAC/PVC, acute ST changes. Appears similar to ECG from December 2023.  Orders: -     TSH -     CBC -     Basic metabolic panel  Type 2 diabetes mellitus with hyperglycemia, without long-term current use of insulin (HCC) Assessment & Plan: Appears to be improving based on glucose readings recently.  Continue glipizide XL 5 mg daily, Lantus 25 units daily, Humalog 15 units 3 times daily with meals, Mounjaro 7.5 mg weekly.  Consider dose reduction of insulin if glucose levels continue to drop.  She will update if that happens.   Glucose readings rarely drop below 70.          Pleas Koch, NP

## 2022-10-11 NOTE — Assessment & Plan Note (Addendum)
Well-controlled today.  Continue amlodipine 2.5 mg daily and valsartan 160 mg daily.  It is likely that her birth control pills are contributing to her symptoms of headaches/chest pressure as she began birth control around the time of symptom onset.  She does not exhibit symptoms of PE.  Neuro exam grossly unremarkable. Glucose levels under excellent control.  Checking CBC and BMP today. We discussed that if her symptoms persist to consider discontinuation of her birth control and notify GYN.

## 2022-10-11 NOTE — Assessment & Plan Note (Addendum)
It is likely that her birth control pills are contributing to her symptoms of headaches/chest pressure as she began birth control around the time of symptom onset.  She does not exhibit symptoms of PE.  Neuro exam grossly unremarkable. Glucose levels under excellent control.  Checking CBC and BMP today. We discussed that if her symptoms persist to consider discontinuation of her birth control and notify GYN.  ECG today with NSR with rate of 89. No PAC/PVC, acute ST changes. Appears similar to ECG from December 2023.

## 2022-10-11 NOTE — Addendum Note (Signed)
Addended by: Pleas Koch on: 10/11/2022 04:04 PM   Modules accepted: Orders

## 2022-10-11 NOTE — Assessment & Plan Note (Addendum)
Appears to be improving based on glucose readings recently.  Continue glipizide XL 5 mg daily, Lantus 25 units daily, Humalog 15 units 3 times daily with meals, Mounjaro 7.5 mg weekly.  Consider dose reduction of insulin if glucose levels continue to drop.  She will update if that happens.   Glucose readings rarely drop below 70.

## 2022-10-11 NOTE — Telephone Encounter (Signed)
I spoke with pt; pt said she could be at [facility] at 3:40 today. Pt said recked BP at 9:30 AM BP 133/93 P 90. Pt said had head and chest pressure earlier but not now. UC & ED precautions given and pt voiced understanding.pt is schedule to see Gentry Fitz NP 10/11/22 at 3:40. Sending note to Gentry Fitz NP and [employer].

## 2022-10-11 NOTE — Patient Instructions (Signed)
Consider discontinuing your birth control pills.  Monitor your blood pressure daily, especially when feeling well.  Notify me if readings are >140 on top and/or >90 on bottom.  It was a pleasure to see you today!

## 2022-10-11 NOTE — Telephone Encounter (Signed)
Please call patient:  Can we add her on today for 3:40 pm?

## 2022-10-11 NOTE — Telephone Encounter (Signed)
Noted, will evaluate. 

## 2022-10-12 LAB — CBC
HCT: 34.6 % — ABNORMAL LOW (ref 36.0–46.0)
Hemoglobin: 10.9 g/dL — ABNORMAL LOW (ref 12.0–15.0)
MCHC: 31.6 g/dL (ref 30.0–36.0)
MCV: 69.4 fl — ABNORMAL LOW (ref 78.0–100.0)
Platelets: 267 10*3/uL (ref 150.0–400.0)
RBC: 4.98 Mil/uL (ref 3.87–5.11)
RDW: 16 % — ABNORMAL HIGH (ref 11.5–15.5)
WBC: 8.1 10*3/uL (ref 4.0–10.5)

## 2022-10-12 LAB — BASIC METABOLIC PANEL
BUN: 14 mg/dL (ref 6–23)
CO2: 28 mEq/L (ref 19–32)
Calcium: 9.7 mg/dL (ref 8.4–10.5)
Chloride: 102 mEq/L (ref 96–112)
Creatinine, Ser: 0.79 mg/dL (ref 0.40–1.20)
GFR: 95.85 mL/min (ref >60.00)
Glucose, Bld: 100 mg/dL — ABNORMAL HIGH (ref 70–99)
Potassium: 3.8 mEq/L (ref 3.5–5.1)
Sodium: 140 mEq/L (ref 135–145)

## 2022-10-12 LAB — TSH: TSH: 0.41 u[IU]/mL (ref 0.35–5.50)

## 2022-10-15 ENCOUNTER — Other Ambulatory Visit (INDEPENDENT_AMBULATORY_CARE_PROVIDER_SITE_OTHER): Payer: 59

## 2022-10-15 DIAGNOSIS — D649 Anemia, unspecified: Secondary | ICD-10-CM | POA: Diagnosis not present

## 2022-10-15 LAB — IRON: Iron: 98 ug/dL (ref 42–145)

## 2022-10-17 ENCOUNTER — Other Ambulatory Visit: Payer: Self-pay | Admitting: Primary Care

## 2022-10-17 DIAGNOSIS — E1165 Type 2 diabetes mellitus with hyperglycemia: Secondary | ICD-10-CM

## 2022-10-25 ENCOUNTER — Encounter: Payer: Self-pay | Admitting: Obstetrics and Gynecology

## 2022-10-31 ENCOUNTER — Encounter: Payer: Self-pay | Admitting: Family Medicine

## 2022-11-14 ENCOUNTER — Ambulatory Visit (INDEPENDENT_AMBULATORY_CARE_PROVIDER_SITE_OTHER): Payer: 59 | Admitting: Family Medicine

## 2022-11-14 ENCOUNTER — Other Ambulatory Visit: Payer: Self-pay | Admitting: Primary Care

## 2022-11-14 ENCOUNTER — Encounter: Payer: Self-pay | Admitting: Family Medicine

## 2022-11-14 VITALS — BP 126/80 | HR 99 | Wt 263.0 lb

## 2022-11-14 DIAGNOSIS — N911 Secondary amenorrhea: Secondary | ICD-10-CM | POA: Diagnosis not present

## 2022-11-14 DIAGNOSIS — I10 Essential (primary) hypertension: Secondary | ICD-10-CM

## 2022-11-14 NOTE — Assessment & Plan Note (Signed)
Complete w/u with FSH and PRL. Had normal TSH. Check u/s. Lengthy discussion had with pt. to explain anovulation, diagrams and verbal communication used to show impact on ovaries, endometrium, need for endometrial protection, impact on fertility, medical treatments to achieve necessary endpoints.  Should be on cyclical progestin vs. OC's if not trying to get pregnant.  Ok to not have cycle is on POPs.

## 2022-11-14 NOTE — Progress Notes (Signed)
CC: discuss Amenorrhea, Discuss ovarian cyst and possibility of one, Hx of one, had to have surgery to remove a couple of years ago    Still on [employer]

## 2022-11-14 NOTE — Progress Notes (Signed)
   Subjective:    Patient ID: Julia Kline is a 37 y.o. female presenting with Amenorrhea  on 11/14/2022  HPI: Had D & C in July for missed AB. Then had a normal cycle in September and November and has had none since. Denies breast milk leakage. Reports some hot, but unclear if this is related to her blood sugar. Seen by colleague for same and placed on POPs, and having no cycle. She is concerned about not seeing her cycle.   Review of Systems  Constitutional:  Negative for chills and fever.  Respiratory:  Negative for shortness of breath.   Cardiovascular:  Negative for chest pain.  Gastrointestinal:  Negative for abdominal pain, nausea and vomiting.  Genitourinary:  Negative for dysuria.  Skin:  Negative for rash.      Objective:    BP (!) 141/90   Pulse 99   Wt 263 lb (119.3 kg)   BMI 37.74 kg/m  Physical Exam Exam conducted with a chaperone present.  Constitutional:      General: She is not in acute distress.    Appearance: She is well-developed.  HENT:     Head: Normocephalic and atraumatic.  Eyes:     General: No scleral icterus. Cardiovascular:     Rate and Rhythm: Normal rate.  Pulmonary:     Effort: Pulmonary effort is normal.  Abdominal:     Palpations: Abdomen is soft.  Musculoskeletal:     Cervical back: Neck supple.  Skin:    General: Skin is warm and dry.  Neurological:     Mental Status: She is alert and oriented to person, place, and time.  Psychiatric:        Mood and Affect: Mood normal.         Assessment & Plan:   Problem List Items Addressed This Visit       Unprioritized   Secondary amenorrhea - Primary    Complete w/u with FSH and PRL. Had normal TSH. Check u/s. Lengthy discussion had with pt. to explain anovulation, diagrams and verbal communication used to show impact on ovaries, endometrium, need for endometrial protection, impact on fertility, medical treatments to achieve necessary endpoints.  Should be on cyclical  progestin vs. OC's if not trying to get pregnant.  Ok to not have cycle is on POPs.       Relevant Orders   Follicle stimulating hormone   Prolactin   US PELVIC COMPLETE WITH TRANSVAGINAL     Return in about 4 weeks (around 12/12/2022) for virtual or in person, a follow-up.  Donnamae Jude, MD 11/14/2022 1:56 PM

## 2022-11-16 ENCOUNTER — Ambulatory Visit (HOSPITAL_COMMUNITY)
Admission: RE | Admit: 2022-11-16 | Discharge: 2022-11-16 | Disposition: A | Discharge status: Home / Self Care | Payer: 59 | Source: Ambulatory Visit | Attending: Family Medicine | Admitting: Family Medicine

## 2022-11-16 ENCOUNTER — Ambulatory Visit (HOSPITAL_COMMUNITY): Admission: RE | Admit: 2022-11-16 | Payer: 59 | Source: Ambulatory Visit

## 2022-11-16 DIAGNOSIS — N911 Secondary amenorrhea: Secondary | ICD-10-CM

## 2022-11-16 LAB — FOLLICLE STIMULATING HORMONE: FSH: 6.9 m[IU]/mL

## 2022-11-16 LAB — PROLACTIN: Prolactin: 37.1 ng/mL — ABNORMAL HIGH (ref 4.8–33.4)

## 2022-11-21 ENCOUNTER — Other Ambulatory Visit: Payer: 59

## 2022-11-21 DIAGNOSIS — N911 Secondary amenorrhea: Secondary | ICD-10-CM

## 2022-11-22 ENCOUNTER — Encounter: Payer: Self-pay | Admitting: Family Medicine

## 2022-11-22 ENCOUNTER — Telehealth: Payer: Self-pay

## 2022-11-22 ENCOUNTER — Other Ambulatory Visit: Payer: Self-pay | Admitting: Family Medicine

## 2022-11-22 DIAGNOSIS — N911 Secondary amenorrhea: Secondary | ICD-10-CM

## 2022-11-22 LAB — PROLACTIN: Prolactin: 39.6 ng/mL — ABNORMAL HIGH (ref 4.8–33.4)

## 2022-11-22 NOTE — Telephone Encounter (Signed)
Called pt to notify of MRI appt and follow up w/ Dr. Kennon Rounds. Pt understood

## 2022-11-28 ENCOUNTER — Ambulatory Visit: Payer: 59 | Admitting: Family Medicine

## 2022-12-06 ENCOUNTER — Ambulatory Visit (HOSPITAL_COMMUNITY)
Admission: RE | Admit: 2022-12-06 | Discharge: 2022-12-06 | Disposition: A | Discharge status: Home / Self Care | Payer: 59 | Source: Ambulatory Visit | Attending: Family Medicine | Admitting: Family Medicine

## 2022-12-06 DIAGNOSIS — N911 Secondary amenorrhea: Secondary | ICD-10-CM | POA: Diagnosis not present

## 2022-12-06 MED ORDER — GADOBUTROL 1 MMOL/ML IV SOLN
10.0000 mL | Freq: Once | INTRAVENOUS | Status: AC | PRN
  Administered 2022-12-06: 10 mL via INTRAVENOUS

## 2022-12-10 ENCOUNTER — Other Ambulatory Visit: Payer: Self-pay | Admitting: Primary Care

## 2022-12-10 DIAGNOSIS — I10 Essential (primary) hypertension: Secondary | ICD-10-CM

## 2022-12-11 ENCOUNTER — Telehealth: Payer: Self-pay | Admitting: Family Medicine

## 2022-12-11 DIAGNOSIS — D352 Benign neoplasm of pituitary gland: Secondary | ICD-10-CM

## 2022-12-11 NOTE — Telephone Encounter (Signed)
MRI results show 1.7 cm adenoma--reviewed with Endocrinology--who advised endocrine referral--orders placed.

## 2022-12-14 ENCOUNTER — Ambulatory Visit: Payer: 59 | Admitting: Primary Care

## 2022-12-14 VITALS — BP 110/76 | HR 80 | Temp 97.3°F | Ht 70.0 in | Wt 254.0 lb

## 2022-12-14 DIAGNOSIS — I10 Essential (primary) hypertension: Secondary | ICD-10-CM | POA: Diagnosis not present

## 2022-12-14 DIAGNOSIS — Z794 Long term (current) use of insulin: Secondary | ICD-10-CM

## 2022-12-14 DIAGNOSIS — Z7984 Long term (current) use of oral hypoglycemic drugs: Secondary | ICD-10-CM

## 2022-12-14 DIAGNOSIS — E1165 Type 2 diabetes mellitus with hyperglycemia: Secondary | ICD-10-CM

## 2022-12-14 DIAGNOSIS — D352 Benign neoplasm of pituitary gland: Secondary | ICD-10-CM | POA: Diagnosis not present

## 2022-12-14 DIAGNOSIS — Z7985 Long-term (current) use of injectable non-insulin antidiabetic drugs: Secondary | ICD-10-CM

## 2022-12-14 LAB — POCT GLYCOSYLATED HEMOGLOBIN (HGB A1C): Hemoglobin A1C: 6.4 % — AB (ref 4.0–5.6)

## 2022-12-14 MED ORDER — TIRZEPATIDE 10 MG/0.5ML ~~LOC~~ SOAJ
10.0000 mg | SUBCUTANEOUS | 0 refills | Status: DC
Start: 2022-12-14 — End: 2022-12-27

## 2022-12-14 NOTE — Assessment & Plan Note (Signed)
Reviewed MRI from April 2024. Pending evaluation per specialist for next steps.

## 2022-12-14 NOTE — Assessment & Plan Note (Signed)
Improved and controlled with A1C of 6.4 today!  Commended her on weight loss!  Increase Mounjaro to 10 mg weekly. Discussed that we may be able to discontinue Humalog, but to keep monitoring blood sugars and update.  Continue Lantus 25 units for now. Continue Glipizide XL 5 mg daily.  Foot exam today. Follow up in 3 months.

## 2022-12-14 NOTE — Patient Instructions (Signed)
We increased the dose of your Mounjaro to 10 mg weekly for diabetes.  Continue to watch your blood sugars.  Please schedule a follow up visit for 3 months.  It was a pleasure to see you today!

## 2022-12-14 NOTE — Progress Notes (Signed)
Subjective:    Patient ID: Julia Kline, female    DOB: [birth year], 37 y.o.   MRN: [id number]  HPI  Julia Kline is a very pleasant 37 y.o. female with a history of hypertension, type 2 diabetes, obesity who presents today for follow up of diabetes and hypertension.  1) Type 2 Diabetes:  Current medications include: Mounjaro 7.5 mg weekly, Glipizide XL 5 mg daily, Lantus 25 units daily, Humalog 15 units TID with meals.   She will not take Humalog if her glucose reading in in the 60's.   She is checking her blood glucose continuously and is getting readings of:  AM fasting: low 100's Afternoon: <150 Bedtime: <150  Last A1C: 7.4 in January 2024 and 6.4 today Last Eye Exam: UTD Last Foot Exam: Due Pneumonia Vaccination: Never completed Urine Microalbumin: UTD Statin: None. Child bearing age.  Dietary changes since last visit: Increased intake of salads, cutting back on carbs, increased protein.    Exercise: Walking 5 days weekly   Wt Readings from Last 3 Encounters:  12/14/22 254 lb (115.2 kg)  11/14/22 263 lb (119.3 kg)  10/11/22 264 lb 2 oz (119.8 kg)     2) Hypertension: Currently managed on Valsartan 160 mg daliy and amlodipine 2.5 mg daily. She denies headaches, chest pain, dizziness.   Her OB/GYN recently discovered a pituitary adenoma. She is pending evaluation per a specialist.   BP Readings from Last 3 Encounters:  12/14/22 110/76  11/14/22 126/80  10/11/22 115/79      Review of Systems  Eyes:  Negative for visual disturbance.  Cardiovascular:  Negative for chest pain.  Endocrine: Negative for polydipsia and polyuria.  Neurological:  Negative for numbness.         Past Medical History:  Diagnosis Date   Acute conjunctivitis of right eye 07/10/2019   Acute pain of right knee 08/06/2019   Anemia 10/14/2019   Complicated UTI (urinary tract infection) 03/10/2021   COVID 2020   had pneumonia   COVID-19    Diabetes mellitus  without complication (HCC)    Eczema    Hernia cerebri (HCC)    Hidradenitis axillaris 04/20/2020   [facility]. Last seen 05/26/2020: instructed to wash b/l with Hibiclens and take Doxycycline as prescribed and follow up in one month.    History of COVID-19 12/21/2019   History of gestational diabetes 05/02/2017   Normal pp testing 10/2017   History of severe pre-eclampsia 05/02/2017   Hypertension    on no medications   Impingement syndrome of left shoulder 02/16/2020   knee   Mastitis 06/18/2019   Migraine    PCOS (polycystic ovarian syndrome)    Pneumonia    Pregnancy induced hypertension    Skin lesions 09/12/2020    Social History   Socioeconomic History   Marital status: Married    Spouse name: Not on file   Number of children: Not on file   Years of education: Not on file   Highest education level: Master's degree (e.g., MA, MS, MEng, MEd, MSW, MBA)  Occupational History   Not on file  Tobacco Use   Smoking status: Never   Smokeless tobacco: Never  Vaping Use   Vaping Use: Never used  Substance and Sexual Activity   Alcohol use: No   Drug use: No   Sexual activity: Yes    Birth control/protection: None  Other Topics Concern   Not on file  Social History Narrative   Married.  2 children.   Works at [employer].   Social Determinants of Health   Financial Resource Strain: Low Risk  (12/14/2022)   Overall Financial Resource Strain (CARDIA)    Difficulty of Paying Living Expenses: Not very hard  Food Insecurity: No Food Insecurity (12/14/2022)   Hunger Vital Sign    Worried About Running Out of Food in the Last Year: Never true    Ran Out of Food in the Last Year: Never true  Transportation Needs: No Transportation Needs (12/14/2022)   PRAPARE - Administrator, Civil Service (Medical): No    Lack of Transportation (Non-Medical): No  Physical Activity: Sufficiently Active (12/14/2022)   Exercise Vital Sign     Days of Exercise per Week: 6 days    Minutes of Exercise per Session: 150+ min  Stress: Not on file  Social Connections: Moderately Integrated (12/14/2022)   Social Connection and Isolation Panel [NHANES]    Frequency of Communication with Friends and Family: More than three times a week    Frequency of Social Gatherings with Friends and Family: Once a week    Attends Religious Services: More than 4 times per year    Active Member of [employer] or Organizations: No    Attends Engineer, structural: Not on file    Marital Status: Married  Catering manager Violence: Not on file    Past Surgical History:  Procedure Laterality Date   CESAREAN SECTION  2017   Procedure: CESAREAN DELIVERY ONLY; Surgeon: Marian Sorrow, MD; Location: C-SECTION SUITE [facility]; Service: Obstetrics   CESAREAN SECTION N/A 10/02/2017   Procedure: CESAREAN SECTION;  Surgeon: Tilda Burrow, MD;  Location: [facility] BIRTHING SUITES;  Service: Obstetrics;  Laterality: N/A;   DILATION AND EVACUATION N/A 03/06/2022   Procedure: DILATATION AND EVACUATION;  Surgeon: Hermina Staggers, MD;  Location: [facility] OR;  Service: Gynecology;  Laterality: N/A;   I & D EXTREMITY Left 10/14/2020   Procedure: Irrigation and debridement left small and ring finger with repair of tendon;  Surgeon: Dominica Severin, MD;  Location: [facility] OR;  Service: Orthopedics;  Laterality: Left;  1 hr Block with IV Sed   LAPAROSCOPIC OOPHERECTOMY Left    LEEP     OVARIAN CYST REMOVAL     TONSILLECTOMY     WISDOM TOOTH EXTRACTION      Family History  Problem Relation Age of Onset   Diabetes Mother    Migraines Mother    Diabetes Father    Hypertension Father    Hypertension Maternal Grandmother    Diabetes Maternal Grandmother    Diabetes Maternal Grandfather    COPD Maternal Grandfather    Diabetes Paternal Grandmother    Hypertension Paternal Grandmother    Diabetes Paternal Grandfather    Hypertension Paternal Grandfather    Heart attack Paternal  Grandfather    Asthma Sister     Allergies  Allergen Reactions   Ancef [Cefazolin] Hives and Swelling    Facial and neck swelling; Had reaction after receiving fentanyl and ancef in the OR   Fentanyl Hives and Swelling    Facial and neck swelling; Had reaction after receiving fentanyl and ancef in the OR   Shrimp [Shellfish Allergy] Swelling and Other (See Comments)    Swelling of throat   Chlorhexidine Hives, Itching and Rash   Morphine And Related Rash   Pseudoephedrine Hcl Other (See Comments)    Light headed/ dizzy    Current Outpatient Medications on File Prior to  Visit  Medication Sig Dispense Refill   amLODipine (NORVASC) 2.5 MG tablet TAKE 1 TABLET(2.5 MG) BY MOUTH DAILY FOR BLOOD PRESSURE 90 tablet 2   Continuous Blood Gluc Sensor (FREESTYLE LIBRE 3 SENSOR) MISC Place 1 sensor on the skin every 14 days. Use to check glucose continuously. 6 each 3   glipiZIDE (GLUCOTROL XL) 5 MG 24 hr tablet TAKE 1 TABLET(5 MG) BY MOUTH DAILY WITH BREAKFAST FOR DIABETES 90 tablet 1   insulin glargine (LANTUS SOLOSTAR) 100 UNIT/ML Solostar Pen Inject 25 Units into the skin daily. 30 mL 0   insulin lispro (HUMALOG KWIKPEN) 100 UNIT/ML KwikPen Inject 15 Units into the skin with breakfast, with lunch, and with evening meal. 15 mL 11   Insulin Pen Needle (TECHLITE PEN NEEDLES) 32G X 4 MM MISC USE NIGHTLY WITH INSULIN 100 each 1   valsartan (DIOVAN) 160 MG tablet TAKE 1 TABLET BY MOUTH DAILY FOR BLOOD PRESSURE 90 tablet 2   norethindrone (MICRONOR) 0.35 MG tablet Take 1 tablet (0.35 mg total) by mouth daily. (Patient not taking: Reported on 12/14/2022) 84 tablet 6   No current facility-administered medications on file prior to visit.    BP 110/76   Pulse 80   Temp (!) 97.3 F (36.3 C) (Temporal)   Ht 5\' 10"  (1.778 m)   Wt 254 lb (115.2 kg)   LMP 06/24/2022 (Exact Date)   SpO2 95%   BMI 36.45 kg/m  Objective:   Physical Exam Cardiovascular:     Rate and Rhythm: Normal rate and regular  rhythm.  Pulmonary:     Effort: Pulmonary effort is normal.     Breath sounds: Normal breath sounds.  Musculoskeletal:     Cervical back: Neck supple.  Skin:    General: Skin is warm and dry.           Assessment & Plan:  Type 2 diabetes mellitus with hyperglycemia, without long-term current use of insulin (HCC) Assessment & Plan: Improved and controlled with A1C of 6.4 today!  Commended her on weight loss!  Increase Mounjaro to 10 mg weekly. Discussed that we may be able to discontinue Humalog, but to keep monitoring blood sugars and update.  Continue Lantus 25 units for now. Continue Glipizide XL 5 mg daily.  Foot exam today. Follow up in 3 months.  Orders: -     POCT glycosylated hemoglobin (Hb A1C) -     Tirzepatide; Inject 10 mg into the skin once a week. for diabetes.  Dispense: 6 mL; Refill: 0  Primary hypertension Assessment & Plan: Controlled.  Continue amlodipine 2.5 mg daily and valsartan 160 mg daily.    Pituitary adenoma [facility]) Assessment & Plan: Reviewed MRI from April 2024. Pending evaluation per specialist for next steps.          Doreene Nest, NP

## 2022-12-14 NOTE — Assessment & Plan Note (Signed)
Controlled.  Continue amlodipine 2.5 mg daily and valsartan 160 mg daily.

## 2022-12-18 ENCOUNTER — Telehealth: Payer: Self-pay | Admitting: Primary Care

## 2022-12-18 NOTE — Telephone Encounter (Signed)
Patient called in and was wanting to know if it ok to discontinue one of her blood pressure medications. She stated that she has been feeling exhausted and very tired with taking both blood pressure medication. She wants to try to see if this will help with the exhaustion, but she isn't sure which one she should stop. Please advise. Thank you!

## 2022-12-18 NOTE — Telephone Encounter (Signed)
Have her stop the amlodipine 2.5 mg for blood pressure.  Have her watch her BP and notify us if she sees readings consistently at or above 135 on top and/or 90 on bottom.   BP Readings from Last 3 Encounters:  12/14/22 110/76  11/14/22 126/80  10/11/22 115/79

## 2022-12-18 NOTE — Telephone Encounter (Signed)
Called patient and reviewed all information. Patient verbalized understanding. Will call if any further questions.  

## 2022-12-20 ENCOUNTER — Ambulatory Visit: Payer: 59 | Admitting: "Endocrinology

## 2022-12-20 ENCOUNTER — Encounter: Payer: Self-pay | Admitting: "Endocrinology

## 2022-12-20 VITALS — BP 120/86 | HR 86 | Wt 256.4 lb

## 2022-12-20 DIAGNOSIS — D352 Benign neoplasm of pituitary gland: Secondary | ICD-10-CM | POA: Diagnosis not present

## 2022-12-20 DIAGNOSIS — N911 Secondary amenorrhea: Secondary | ICD-10-CM | POA: Diagnosis not present

## 2022-12-20 NOTE — Progress Notes (Signed)
Outpatient Endocrinology Note Julia Lodge, MD    Julia Kline April 16, 1986 [id number]  Referring Provider: Reva Bores, MD Primary Care Provider: Doreene Nest, NP Reason for consultation: Subjective   Assessment & Plan  Julia Kline was seen today for new patient (initial visit).  Diagnoses and all orders for this visit:  Pituitary macroadenoma (HCC) -     ACTH; Future -     Cortisol; Future -     Estradiol; Future -     Follicle stimulating hormone; Future -     Luteinizing hormone; Future -     Insulin-like growth factor; Future -     Growth hormone; Future -     T4, free; Future -     Prolactin; Future -     TSH; Future -     Ambulatory referral to Neurosurgery  Secondary amenorrhea    Patient found to have 1.7 cm pituitary macroadenoma with suprasellar involvement and chiasmatic impingement on MRI done on 12/11/22 Patient reports developing complete amenorrhea compared to her regular menstrual cycle starting November 2023 as well as decrease in libido around the same time, some recent acne noted on face Patient does not have any other symptoms pertaining any other hormones at this time Prolactin levels has been done twice with levels between 37-39, last done a month ago Ordered other baseline labs, based on repeat prolactin may consider a trial of cabergoline Ordered urgent referral to neurosurgery Discussed the case with all the questions and management plan with the patient, her husband as well as her mother who accompanied her during this visit Patient understands and agreed  Return in about 4 weeks (around 01/17/2023).   I spent more than 50% of today's visit counseling patient on symptoms, examination findings, lab findings, imaging results, treatment decisions and monitoring and prognosis. The patient understood the recommendations and agrees with the treatment plan. All questions regarding treatment plan were fully answered  Julia Lenzburg,  MD    History of Present Illness   Julia Kline is a 37 y.o. female referred by Dr. Shawnie Pons for evaluation and management of pituitary macroadenoma with suprasellar involvement and chiasmatic impingement on MRI done on 12/11/22.  Menstrual cycle stopped in Nov' 2023. No galactorrhea No vision issues No head aches/vomiting  She reports the following;  Headaches - visual blurring/ diplopia/ fields defect - Galactorrhea-   change in facial appearance - body habitus - change in her hand, ring, hat, shoe size - hyperhidrosis - arthralgias -  fatigue - weight change - change in appetite - heat/cold intolerance - change in bowel movements - change in muscle strength - changes in skin or hair - sweats  - palpitations  - insomnia  - tremor -   moon faces - fat pads - increased girth  - plethora hyperpigmentation -  purple striae - acne +  vellus/terminal hirsutism - proximal muscle weakness - nausea/vomiting - lightheadedness - abdominal pain -  change in libido +, decreased since a 06/2022 change in muscle strength/mass - change in body hair - hot flashes - night sweats -  Family history is negative for pituitary tumor or other abnormalities concerning for MEN syndrome.     On: 12/11/2022 05:36: images reviewed and interpreted independently  CLINICAL DATA:  Secondary amenorrhea   EXAM: MRI HEAD WITHOUT AND WITH CONTRAST   TECHNIQUE: Multiplanar, multiecho pulse sequences of the brain and surrounding structures were obtained without and with intravenous contrast.   CONTRAST:  10mL GADAVIST  GADOBUTROL 1 MMOL/ML IV SOLN   COMPARISON:  Head CT 07/26/2022   FINDINGS: Brain: 17 mm mass inseparable from the pituitary gland. The mass is primarily cystic but there is eccentric mural thickening/nodularity especially along the superior aspect. Craniocaudal extent is 17 mm with upward bulging and chiasmatic impaction. No cavernous sinus invasion.   No  infarct, hemorrhage, hydrocephalus, or collection. Brain volume is normal.   Vascular: Normal flow voids and vascular enhancements.   Skull and upper cervical spine: Normal marrow signal.   Sinuses/Orbits: Leftward nasal septal deviation.   IMPRESSION: 17 mm solid and cystic pituitary mass compatible with adenoma. There is suprasellar involvement with chiasmatic impingement.    07/26/2022 CT HEAD No pathology. No comment on pituitary.   Physical Exam  BP 120/86 (BP Location: Left Arm, Patient Position: Sitting, Cuff Size: Normal)   Pulse 86   Wt 256 lb 6.4 oz (116.3 kg)   LMP 06/24/2022 (Exact Date)   SpO2 99%   BMI 36.79 kg/m    Constitutional: well developed, well nourished Head: normocephalic, atraumatic Eyes: sclera anicteric, no redness, intact temporal region although patient seems confused when answering on the right side initially saying no and then yes Neck: supple Lungs: normal respiratory effort Neurology: alert and oriented Skin: dry, no appreciable rashes Musculoskeletal: no appreciable defects Psychiatric: normal mood and affect   Current Medications Patient's Medications  New Prescriptions   No medications on file  Previous Medications   AMLODIPINE (NORVASC) 2.5 MG TABLET    TAKE 1 TABLET(2.5 MG) BY MOUTH DAILY FOR BLOOD PRESSURE   CONTINUOUS BLOOD GLUC SENSOR (FREESTYLE LIBRE 3 SENSOR) MISC    Place 1 sensor on the skin every 14 days. Use to check glucose continuously.   GLIPIZIDE (GLUCOTROL XL) 5 MG 24 HR TABLET    TAKE 1 TABLET(5 MG) BY MOUTH DAILY WITH BREAKFAST FOR DIABETES   INSULIN GLARGINE (LANTUS SOLOSTAR) 100 UNIT/ML SOLOSTAR PEN    Inject 25 Units into the skin daily.   INSULIN LISPRO (HUMALOG KWIKPEN) 100 UNIT/ML KWIKPEN    Inject 15 Units into the skin with breakfast, with lunch, and with evening meal.   INSULIN PEN NEEDLE (TECHLITE PEN NEEDLES) 32G X 4 MM MISC    USE NIGHTLY WITH INSULIN   NORETHINDRONE (MICRONOR) 0.35 MG TABLET    Take 1  tablet (0.35 mg total) by mouth daily.   TIRZEPATIDE (MOUNJARO) 10 MG/0.5ML PEN    Inject 10 mg into the skin once a week. for diabetes.   VALSARTAN (DIOVAN) 160 MG TABLET    TAKE 1 TABLET BY MOUTH DAILY FOR BLOOD PRESSURE  Modified Medications   No medications on file  Discontinued Medications   No medications on file    Allergies Allergies  Allergen Reactions   Ancef [Cefazolin] Hives and Swelling    Facial and neck swelling; Had reaction after receiving fentanyl and ancef in the OR   Fentanyl Hives and Swelling    Facial and neck swelling; Had reaction after receiving fentanyl and ancef in the OR   Shrimp [Shellfish Allergy] Swelling and Other (See Comments)    Swelling of throat   Chlorhexidine Hives, Itching and Rash   Morphine And Related Rash   Pseudoephedrine Hcl Other (See Comments)    Light headed/ dizzy    Past Medical History Past Medical History:  Diagnosis Date   Acute conjunctivitis of right eye 07/10/2019   Acute pain of right knee 08/06/2019   Anemia 10/14/2019   Complicated UTI (urinary tract infection)  03/10/2021   COVID 2020   had pneumonia   COVID-19    Diabetes mellitus without complication (HCC)    Eczema    Hernia cerebri (HCC)    Hidradenitis axillaris 04/20/2020   Followed by [facility] surgery. Last seen 05/26/2020: instructed to wash b/l with Hibiclens and take Doxycycline as prescribed and follow up in one month.    History of COVID-19 12/21/2019   History of gestational diabetes 05/02/2017   Normal pp testing 10/2017   History of severe pre-eclampsia 05/02/2017   Hypertension    on no medications   Impingement syndrome of left shoulder 02/16/2020   knee   Mastitis 06/18/2019   Migraine    PCOS (polycystic ovarian syndrome)    Pneumonia    Pregnancy induced hypertension    Skin lesions 09/12/2020    Past Surgical History Past Surgical History:  Procedure Laterality Date   CESAREAN SECTION  2017   Procedure: CESAREAN  DELIVERY ONLY; Surgeon: Marian Sorrow, MD; [facility]: [facility]SECTION SU[facility]; Service: Obstetrics   CESAREAN SECTION N/A 10/02/2017   Procedure: CESAREAN SECTION;  Surgeon: Tilda Burrow, MD;  Location: [facility] BIRTHING SUITES;  Service: Obstetrics;  Laterality: N/A;   DILATION AND EVACUATION N/A 03/06/2022   Procedure: DILATATION AND EVACUATION;  Surgeon: Hermina Staggers, MD;  Location: [facility] OR;  Service: Gynecology;  Laterality: N/A;   I & D EXTREMITY Left 10/14/2020   Procedure: Irrigation and debridement left small and ring finger with repair of tendon;  Surgeon: Dominica Severin, MD;  Location: [facility] OR;  Service: Orthopedics;  Laterality: Left;  1 hr Block with IV Sed   LAPAROSCOPIC OOPHERECTOMY Left    LEEP     OVARIAN CYST REMOVAL     TONSILLECTOMY     WISDOM TOOTH EXTRACTION      Family History family history includes Asthma in her sister; COPD in her maternal grandfather; Diabetes in her father, maternal grandfather, maternal grandmother, mother, paternal grandfather, and paternal grandmother; Heart attack in her paternal grandfather; Hypertension in her father, maternal grandmother, paternal grandfather, and paternal grandmother; Migraines in her mother.  Social History Social History   Socioeconomic History   Marital status: Married    Spouse name: Not on file   Number of children: Not on file   Years of education: Not on file   Highest education level: Master's degree (e.g., MA, MS, MEng, MEd, MSW, MBA)  Occupational History   Not on file  Tobacco Use   Smoking status: Never   Smokeless tobacco: Never  Vaping Use   Vaping Use: Never used  Substance and Sexual Activity   Alcohol use: No   Drug use: No   Sexual activity: Yes    Birth control/protection: None  Other Topics Concern   Not on file  Social History Narrative   Married.   2 children.   Works at [employer].   Social Determinants of Health   Financial Resource Strain: Low Risk  (12/14/2022)   Overall  Financial Resource Strain (CARDIA)    Difficulty of Paying Living Expenses: Not very hard  Food Insecurity: No Food Insecurity (12/14/2022)   Hunger Vital Sign    Worried About Running Out of Food in the Last Year: Never true    Ran Out of Food in the Last Year: Never true  Transportation Needs: No Transportation Needs (12/14/2022)   PRAPARE - Administrator, Civil Service (Medical): No    Lack of Transportation (Non-Medical): No  Physical  Activity: Sufficiently Active (12/14/2022)   Exercise Vital Sign    Days of Exercise per Week: 6 days    Minutes of Exercise per Session: 150+ min  Stress: Not on file  Social Connections: Moderately Integrated (12/14/2022)   Social Connection and Isolation Panel [NHANES]    Frequency of Communication with Friends and Family: More than three times a week    Frequency of Social Gatherings with Friends and Family: Once a week    Attends Religious Services: More than 4 times per year    Active Member of Clubs or Organizations: No    Attends Banker Meetings: Not on file    Marital Status: Married  Intimate Partner Violence: Not on file    Lab Results  Component Value Date   TSH 0.41 10/11/2022   TSH 0.736 09/20/2022   TSH 0.87 05/10/2020   FSH 6.9 11/14/2022   PROLACTIN 39.6 (H) 11/21/2022   PROLACTIN 37.1 (H) 11/14/2022    Lab Results  Component Value Date   CHOL 137 11/30/2021   Lab Results  Component Value Date   HDL 45.00 11/30/2021   Lab Results  Component Value Date   LDLCALC 72 11/30/2021   Lab Results  Component Value Date   TRIG 100.0 11/30/2021   Lab Results  Component Value Date   CHOLHDL 3 11/30/2021   Lab Results  Component Value Date   CREATININE 0.79 10/11/2022   Lab Results  Component Value Date   GFR 95.85 10/11/2022      Component Value Date/Time   NA 140 10/11/2022 1605   NA 145 (H) 10/09/2017 1500   K 3.8 10/11/2022 1605   CL 102 10/11/2022 1605   CO2 28 10/11/2022 1605    GLUCOSE 100 (H) 10/11/2022 1605   BUN 14 10/11/2022 1605   BUN 8 10/09/2017 1500   CREATININE 0.79 10/11/2022 1605   CALCIUM 9.7 10/11/2022 1605   PROT 8.1 07/26/2022 1443   PROT 5.7 (L) 10/09/2017 1500   ALBUMIN 4.1 07/26/2022 1443   ALBUMIN 2.7 (L) 10/09/2017 1500   AST 48 (H) 07/26/2022 1443   ALT 66 (H) 07/26/2022 1443   ALKPHOS 70 07/26/2022 1443   BILITOT 0.2 (L) 07/26/2022 1443   BILITOT <0.2 10/09/2017 1500   GFRNONAA >60 07/26/2022 1443   GFRAA >60 03/25/2020 1513      Latest Ref Rng & Units 10/11/2022    4:05 PM 08/16/2022   10:58 AM 07/26/2022    3:35 PM  BMP  Glucose 70 - 99 mg/dL 440  102  95   BUN 6 - 23 mg/dL 14  10  7    Creatinine 0.40 - 1.20 mg/dL 7.25  3.66  4.40   Sodium 135 - 145 mEq/L 140  137  140   Potassium 3.5 - 5.1 mEq/L 3.8  4.0  3.7   Chloride 96 - 112 mEq/L 102  102  103   CO2 19 - 32 mEq/L 28  28    Calcium 8.4 - 10.5 mg/dL 9.7  8.8         Component Value Date/Time   WBC 8.1 10/11/2022 1605   RBC 4.98 10/11/2022 1605   HGB 10.9 (L) 10/11/2022 1605   HGB 9.9 (L) 10/13/2019 1605   HGB 10.8 04/01/2017 0000   HCT 34.6 (L) 10/11/2022 1605   HCT 31.7 (L) 10/13/2019 1605   HCT 34 04/01/2017 0000   PLT 267.0 10/11/2022 1605   PLT 296 10/13/2019 1605   PLT 252 04/01/2017  0000   MCV 69.4 Repeated and verified X2. (L) 10/11/2022 1605   MCV 72 (L) 10/13/2019 1605   MCH 21.6 (L) 07/26/2022 1443   MCHC 31.6 10/11/2022 1605   RDW 16.0 (H) 10/11/2022 1605   RDW 14.3 10/13/2019 1605   LYMPHSABS 2.4 06/25/2022 2145   MONOABS 0.8 06/25/2022 2145   EOSABS 0.1 06/25/2022 2145   BASOSABS 0.1 06/25/2022 2145     Parts of this note may have been dictated using voice recognition software. There may be variances in spelling and vocabulary which are unintentional. Not all errors are proofread. Please notify the Thereasa Parkin if any discrepancies are noted or if the meaning of any statement is not clear.

## 2022-12-20 NOTE — Addendum Note (Signed)
Addended by: Altamese Stockton on: 12/20/2022 12:43 PM   Modules accepted: Orders

## 2022-12-21 ENCOUNTER — Other Ambulatory Visit (INDEPENDENT_AMBULATORY_CARE_PROVIDER_SITE_OTHER): Payer: 59

## 2022-12-21 DIAGNOSIS — D352 Benign neoplasm of pituitary gland: Secondary | ICD-10-CM | POA: Diagnosis not present

## 2022-12-21 LAB — T4, FREE: Free T4: 0.65 ng/dL (ref 0.60–1.60)

## 2022-12-21 LAB — FOLLICLE STIMULATING HORMONE: FSH: 1.9 m[IU]/mL

## 2022-12-21 LAB — LUTEINIZING HORMONE: LH: 1.02 m[IU]/mL

## 2022-12-21 LAB — CORTISOL: Cortisol, Plasma: 7.9 ug/dL

## 2022-12-21 LAB — TSH: TSH: 0.98 u[IU]/mL (ref 0.35–5.50)

## 2022-12-24 DIAGNOSIS — E1165 Type 2 diabetes mellitus with hyperglycemia: Secondary | ICD-10-CM

## 2022-12-24 LAB — PROLACTIN W/DILUTION: PROLACTIN,UNDILUTED: 47.6 ng/mL — ABNORMAL HIGH (ref 2.0–30.0)

## 2022-12-24 LAB — ESTRADIOL: Estradiol: 61 pg/mL

## 2022-12-24 LAB — GROWTH HORMONE: Growth Hormone: 0.1 ng/mL (ref <=7.1)

## 2022-12-26 ENCOUNTER — Ambulatory Visit: Payer: 59 | Admitting: Family Medicine

## 2022-12-26 ENCOUNTER — Other Ambulatory Visit (HOSPITAL_COMMUNITY): Payer: Self-pay

## 2022-12-26 ENCOUNTER — Telehealth: Payer: Self-pay | Admitting: Primary Care

## 2022-12-26 NOTE — Telephone Encounter (Signed)
Unable to reach patient. Left voicemail to return call to our office.   

## 2022-12-26 NOTE — Telephone Encounter (Signed)
Pt call in stated she  receive an email that prior authorization  was not approved for RX tirzepatide [facility]) 10 MG/0.5ML Pen  would like to discuss other options [phone]

## 2022-12-26 NOTE — Telephone Encounter (Signed)
Pt returned call would like a call back #[phone]

## 2022-12-26 NOTE — Telephone Encounter (Signed)
Per test claim, medication is covered with $25.00 copay. I called the pharmacy to confirm, and was told that the medication is not in stock and is on back order. No PA needed, please discuss options with patient.

## 2022-12-27 ENCOUNTER — Encounter: Payer: Self-pay | Admitting: "Endocrinology

## 2022-12-27 LAB — PROLACTIN W/DILUTION

## 2022-12-27 LAB — ACTH: C206 ACTH: 39 pg/mL (ref 6–50)

## 2022-12-27 LAB — INSULIN-LIKE GROWTH FACTOR
IGF-I, LC/MS: 91 ng/mL (ref 53–331)
Z-Score (Female): -0.9 SD (ref ?–2.0)

## 2022-12-27 MED ORDER — TIRZEPATIDE 10 MG/0.5ML ~~LOC~~ SOAJ: 10.0000 mg | SUBCUTANEOUS | 0 refills | Status: DC

## 2022-12-27 NOTE — Telephone Encounter (Signed)
See mychart message enocunter

## 2023-01-17 ENCOUNTER — Other Ambulatory Visit: Payer: Self-pay | Admitting: "Endocrinology

## 2023-01-17 DIAGNOSIS — D352 Benign neoplasm of pituitary gland: Secondary | ICD-10-CM

## 2023-01-18 ENCOUNTER — Other Ambulatory Visit: Payer: 59

## 2023-01-24 ENCOUNTER — Ambulatory Visit: Payer: 59 | Admitting: "Endocrinology

## 2023-01-25 ENCOUNTER — Telehealth: Payer: Self-pay | Admitting: Primary Care

## 2023-01-25 MED ORDER — TIRZEPATIDE 12.5 MG/0.5ML ~~LOC~~ SOAJ: 12.5000 mg | SUBCUTANEOUS | 0 refills | Status: DC

## 2023-01-25 NOTE — Telephone Encounter (Signed)
Patient contacted the office regarding medication mounjaro. States she has been taking 10mg  and the pharmacy she uses is currently out of this dosage. Says she was informed that [facility] on [address] has 7.5 mg or 12.5 mg in stock. Wants to know if Jae Dire could send in either of these dosages to the pharmacy for her to take. Says she is due to take her shot today. Please advise, thank you.   [facility] - [place] ([place]), [place] - [address]

## 2023-01-25 NOTE — Telephone Encounter (Signed)
Will respond via MyChart.

## 2023-02-06 ENCOUNTER — Other Ambulatory Visit: Payer: Self-pay | Admitting: Neurological Surgery

## 2023-02-13 ENCOUNTER — Other Ambulatory Visit: Payer: Self-pay | Admitting: Otolaryngology

## 2023-02-25 NOTE — Pre-Procedure Instructions (Signed)
Surgical Instructions    Your procedure is scheduled on March 06, 2023.  Report to [facility] Main Entrance "A" at 6:30 A.M., then check in with the Admitting office.  Call this number if you have problems the morning of surgery:  [phone]  If you have any questions prior to your surgery date call [phone]: Open Monday-Friday 8am-4pm If you experience any cold or flu symptoms such as cough, fever, chills, shortness of breath, etc. between now and your scheduled surgery, please notify us at the above number.     Remember:  Do not eat after midnight the night before your surgery  You may drink clear liquids until 5:30 AM the morning of your surgery.   Clear liquids allowed are: Water, Non-Citrus Juices (without pulp), Carbonated Beverages, Clear Tea, Black Coffee Only (NO MILK, CREAM OR POWDERED CREAMER of any kind), and Gatorade.     Take these medicines the morning of surgery with A SIP OF WATER:  NONE    As of today, STOP taking any Aspirin (unless otherwise instructed by your surgeon) Aleve, Naproxen, Ibuprofen, Motrin, Advil, Goody's, BC's, all herbal medications, fish oil, and all vitamins. This includes your medication: aspirin-acetaminophen-caffeine (EXCEDRIN MIGRAINE)    WHAT DO I DO ABOUT MY DIABETES MEDICATION?   Do not take glipiZIDE (GLUCOTROL XL) the morning of surgery.  STOP taking your tirzepatide [facility]) one week prior to surgery.      HOW TO MANAGE YOUR DIABETES BEFORE AND AFTER SURGERY  Why is it important to control my blood sugar before and after surgery? Improving blood sugar levels before and after surgery helps healing and can limit problems. A way of improving blood sugar control is eating a healthy diet by:  Eating less sugar and carbohydrates  Increasing activity/exercise  Talking with your doctor about reaching your blood sugar goals High blood sugars (greater than 180 mg/dL) can raise your risk of infections and slow your recovery, so  you will need to focus on controlling your diabetes during the weeks before surgery. Make sure that the doctor who takes care of your diabetes knows about your planned surgery including the date and location.  How do I manage my blood sugar before surgery? Check your blood sugar at least 4 times a day, starting 2 days before surgery, to make sure that the level is not too high or low.  Check your blood sugar the morning of your surgery when you wake up and every 2 hours until you get to the Short Stay unit.  If your blood sugar is less than 70 mg/dL, you will need to treat for low blood sugar: Do not take insulin. Treat a low blood sugar (less than 70 mg/dL) with  cup of clear juice (cranberry or apple), 4 glucose tablets, OR glucose gel. Recheck blood sugar in 15 minutes after treatment (to make sure it is greater than 70 mg/dL). If your blood sugar is not greater than 70 mg/dL on recheck, call [phone] for further instructions. Report your blood sugar to the short stay nurse when you get to Short Stay.  If you are admitted to the hospital after surgery: Your blood sugar will be checked by the staff and you will probably be given insulin after surgery (instead of oral diabetes medicines) to make sure you have good blood sugar levels. The goal for blood sugar control after surgery is 80-180 mg/dL.  Do NOT Smoke (Tobacco/Vaping) for 24 hours prior to your procedure.  If you use a CPAP at night, you may bring your mask/headgear for your overnight stay.   Contacts, glasses, piercing's, hearing aid's, dentures or partials may not be worn into surgery, please bring cases for these belongings.    For patients admitted to the hospital, discharge time will be determined by your treatment team.   Patients discharged the day of surgery will not be allowed to drive home, and someone needs to stay with them for 24 hours.  SURGICAL WAITING ROOM VISITATION Patients having  surgery or a procedure may have no more than 2 support people in the waiting area - these visitors may rotate.   Children under the age of 18 must have an adult with them who is not the patient. If the patient needs to stay at the hospital during part of their recovery, the visitor guidelines for inpatient rooms apply. Pre-op nurse will coordinate an appropriate time for 1 support person to accompany patient in pre-op.  This support person may not rotate.   Please refer to the [facility] website for the visitor guidelines for Inpatients (after your surgery is over and you are in a regular room).    Special instructions:   [facility]- Preparing For Surgery  Before surgery, you can play an important role. Because skin is not sterile, your skin needs to be as free of germs as possible. You can reduce the number of germs on your skin by washing with CHG (chlorahexidine gluconate) Soap before surgery.  CHG is an antiseptic cleaner which kills germs and bonds with the skin to continue killing germs even after washing.    Oral Hygiene is also important to reduce your risk of infection.  Remember - BRUSH YOUR TEETH THE MORNING OF SURGERY WITH YOUR REGULAR TOOTHPASTE  Please do not use if you have an allergy to CHG or antibacterial soaps. If your skin becomes reddened/irritated stop using the CHG.  Do not shave (including legs and underarms) for at least 48 hours prior to first CHG shower. It is OK to shave your face.  Please follow these instructions carefully.   Shower the NIGHT BEFORE SURGERY and the MORNING OF SURGERY  If you chose to wash your hair, wash your hair first as usual with your normal shampoo.  After you shampoo, rinse your hair and body thoroughly to remove the shampoo.  Use CHG Soap as you would any other liquid soap. You can apply CHG directly to the skin and wash gently with a scrungie or a clean washcloth.   Apply the CHG Soap to your body ONLY FROM THE NECK DOWN.  Do not use  on open wounds or open sores. Avoid contact with your eyes, ears, mouth and genitals (private parts). Wash Face and genitals (private parts)  with your normal soap.   Wash thoroughly, paying special attention to the area where your surgery will be performed.  Thoroughly rinse your body with warm water from the neck down.  DO NOT shower/wash with your normal soap after using and rinsing off the CHG Soap.  Pat yourself dry with a CLEAN TOWEL.  Wear CLEAN PAJAMAS to bed the night before surgery  Place CLEAN SHEETS on your bed the night before your surgery  DO NOT SLEEP WITH PETS.   Day of Surgery: Take a shower with CHG soap. Do not wear jewelry or makeup Do not wear lotions, powders, perfumes/colognes, or deodorant. Do not shave 48  hours prior to surgery.  Men may shave face and neck. Do not bring valuables to the hospital.  [facility] is not responsible for any belongings or valuables. Do not wear nail polish, gel polish, artificial nails, or any other type of covering on natural nails (fingers and toes) If you have artificial nails or gel coating that need to be removed by a nail salon, please have this removed prior to surgery. Artificial nails or gel coating may interfere with anesthesia's ability to adequately monitor your vital signs.  Wear Clean/Comfortable clothing the morning of surgery Remember to brush your teeth WITH YOUR REGULAR TOOTHPASTE.   Please read over the following fact sheets that you were given.    If you received a COVID test during your pre-op visit  it is requested that you wear a mask when out in public, stay away from anyone that may not be feeling well and notify your surgeon if you develop symptoms. If you have been in contact with anyone that has tested positive in the last 10 days please notify you surgeon.

## 2023-02-26 ENCOUNTER — Encounter (HOSPITAL_COMMUNITY)
Admission: RE | Admit: 2023-02-26 | Discharge: 2023-02-26 | Disposition: A | Discharge status: Home / Self Care | Payer: 59 | Source: Ambulatory Visit | Attending: Neurological Surgery | Admitting: Neurological Surgery

## 2023-02-26 ENCOUNTER — Encounter (HOSPITAL_COMMUNITY): Payer: Self-pay

## 2023-02-26 ENCOUNTER — Other Ambulatory Visit: Payer: Self-pay

## 2023-02-26 VITALS — BP 146/101 | HR 96 | Temp 97.7°F | Resp 18 | Ht 70.0 in | Wt 260.0 lb

## 2023-02-26 DIAGNOSIS — E119 Type 2 diabetes mellitus without complications: Secondary | ICD-10-CM | POA: Diagnosis not present

## 2023-02-26 DIAGNOSIS — Z01812 Encounter for preprocedural laboratory examination: Secondary | ICD-10-CM | POA: Diagnosis not present

## 2023-02-26 DIAGNOSIS — Z794 Long term (current) use of insulin: Secondary | ICD-10-CM | POA: Diagnosis not present

## 2023-02-26 DIAGNOSIS — D352 Benign neoplasm of pituitary gland: Secondary | ICD-10-CM | POA: Diagnosis not present

## 2023-02-26 DIAGNOSIS — E282 Polycystic ovarian syndrome: Secondary | ICD-10-CM | POA: Insufficient documentation

## 2023-02-26 DIAGNOSIS — I10 Essential (primary) hypertension: Secondary | ICD-10-CM | POA: Insufficient documentation

## 2023-02-26 DIAGNOSIS — Z01818 Encounter for other preprocedural examination: Secondary | ICD-10-CM

## 2023-02-26 LAB — HEMOGLOBIN A1C
Hgb A1c MFr Bld: 6.5 % — ABNORMAL HIGH (ref 4.8–5.6)
Mean Plasma Glucose: 139.85 mg/dL

## 2023-02-26 LAB — CBC
HCT: 37.9 % (ref 36.0–46.0)
Hemoglobin: 11.1 g/dL — ABNORMAL LOW (ref 12.0–15.0)
MCH: 22.1 pg — ABNORMAL LOW (ref 26.0–34.0)
MCHC: 29.3 g/dL — ABNORMAL LOW (ref 30.0–36.0)
MCV: 75.3 fL — ABNORMAL LOW (ref 80.0–100.0)
Platelets: 253 10*3/uL (ref 150–400)
RBC: 5.03 MIL/uL (ref 3.87–5.11)
RDW: 14.7 % (ref 11.5–15.5)
WBC: 6.7 10*3/uL (ref 4.0–10.5)
nRBC: 0 % (ref 0.0–0.2)

## 2023-02-26 LAB — BASIC METABOLIC PANEL
Anion gap: 9 (ref 5–15)
BUN: 6 mg/dL (ref 6–20)
CO2: 28 mmol/L (ref 22–32)
Calcium: 9.1 mg/dL (ref 8.9–10.3)
Chloride: 100 mmol/L (ref 98–111)
Creatinine, Ser: 0.72 mg/dL (ref 0.44–1.00)
GFR, Estimated: >60 mL/min (ref >60)
Glucose, Bld: 109 mg/dL — ABNORMAL HIGH (ref 70–99)
Potassium: 3.6 mmol/L (ref 3.5–5.1)
Sodium: 137 mmol/L (ref 135–145)

## 2023-02-26 LAB — TYPE AND SCREEN
ABO/RH(D): O POS
Antibody Screen: NEGATIVE

## 2023-02-26 LAB — GLUCOSE, CAPILLARY: Glucose-Capillary: 108 mg/dL — ABNORMAL HIGH (ref 70–99)

## 2023-02-26 NOTE — Progress Notes (Signed)
PCP - Doreene Nest, NP Cardiologist - Dr. Rollene Rotunda in November 2021 for CP. Stress test completed/normal. PRN follow-up  PPM/ICD - Denies Device Orders - n/a Rep Notified - n/a  Chest x-ray - 07/26/2022 EKG - 10/11/2022 Stress Test - 07/28/2020 ECHO - Denies Cardiac Cath - Denies  Sleep Study - Denies CPAP - n/a  Pt is DM2. She has a [employer] 3 on her left arms. Normal fasting blood sugars 90-120. CBG at pre-op 108. A1c result pending.  Last dose of GLP1 agonist-  Last dose of Mounjaro 02/22/2023 GLP1 instructions: Pt instructed to NOT take her next dose Friday, July 12th. She will hold medication until after surgery  Blood Thinner Instructions: n/a Aspirin Instructions: n/a  ERAS Protcol - Clear liquids until 0530 morning of surgery PRE-SURGERY Ensure or G2- n/a  COVID TEST- n/a   Anesthesia review: Yes. HTN and DM with cardiology visit within the past 5 years.   Patient denies shortness of breath, fever, cough and chest pain at PAT appointment. Pt denies any respiratory illness/infection in the last two months.   All instructions explained to the patient, with a verbal understanding of the material. Patient agrees to go over the instructions while at home for a better understanding. Patient also instructed to self quarantine after being tested for COVID-19. The opportunity to ask questions was provided.

## 2023-02-27 ENCOUNTER — Encounter (HOSPITAL_COMMUNITY): Payer: Self-pay

## 2023-02-27 NOTE — Anesthesia Preprocedure Evaluation (Addendum)
Anesthesia Evaluation   Patient awake    Reviewed: Allergy & Precautions, NPO status , Patient's Chart, lab work & pertinent test results  History of Anesthesia Complications Negative for: history of anesthetic complications  Airway Mallampati: III  TM Distance: >3 FB Neck ROM: Full    Dental  (+) Teeth Intact, Dental Advisory Given   Pulmonary neg shortness of breath, neg sleep apnea, neg COPD, neg recent URI   breath sounds clear to auscultation       Cardiovascular hypertension, Pt. on medications (-) angina (-) Past MI and (-) CHF (-) dysrhythmias  Rhythm:Regular     Neuro/Psych  Headaches Pituitary adenoma    GI/Hepatic negative GI ROS, Neg liver ROS,,,  Endo/Other  diabetes  Lab Results      Component                Value               Date                      HGBA1C                   6.5 (H)             02/26/2023             Renal/GU negative Renal ROS     Musculoskeletal  (+) Arthritis ,    Abdominal   Peds  Hematology  (+) Blood dyscrasia, anemia Lab Results      Component                Value               Date                      WBC                      6.7                 02/26/2023                HGB                      11.1 (L)            02/26/2023                HCT                      37.9                02/26/2023                MCV                      75.3 (L)            02/26/2023                PLT                      253                 02/26/2023              Anesthesia Other Findings   Reproductive/Obstetrics Lab Results      Component  Value               Date                      PREGTESTUR               NEGATIVE            03/06/2023                PREGSERUM                NEGATIVE            06/25/2022                HCG                      <5.0                09/28/2019                HCGQUANT                 <1                  09/20/2022                                         Anesthesia Physical Anesthesia Plan  ASA: 2  Anesthesia Plan: General   Post-op Pain Management: Ofirmev IV (intra-op)*   Induction: Intravenous  PONV Risk Score and Plan: 3 and Ondansetron, Dexamethasone, Propofol infusion, TIVA and Midazolam  Airway Management Planned: Oral ETT  Additional Equipment: Arterial line  Intra-op Plan:   Post-operative Plan:   Informed Consent: I have reviewed the patients History and Physical, chart, labs and discussed the procedure including the risks, benefits and alternatives for the proposed anesthesia with the patient or authorized representative who has indicated his/her understanding and acceptance.     Dental advisory given  Plan Discussed with: CRNA  Anesthesia Plan Comments: (PAT note written 02/27/2023 by Shonna Chock, PA-C.  )       Anesthesia Quick Evaluation

## 2023-02-27 NOTE — Progress Notes (Signed)
Anesthesia Chart Review:  Case: [id number] Date/Time: 03/06/23 0815   Procedures:      endoscopic endonasal resection of pituitary tumor     TRANSPHENOIDAL APPROACH EXPOSURE   Anesthesia type: General   Pre-op diagnosis: Pituitary adenoma   [facility] RO[facility] / [facility]   Surgeons: Jadene Pierini, MD; Skotnicki, Meghan A, DO       DISCUSSION: Patient is a 37 year old female scheduled for the above procedure. Patient found to have 1.7 cm pituitary macroadenoma with suprasellar involvement and chiasmatic impingement on MRI done on 12/11/22.  History includes never smoker, PCOS, HTN, DM2, hidradenitis axillaris, anemia, pituitary adenoma, left ovarian torsion (s/p LSO 10/12/06). BMI is consistent with obesity.    A1c 6.5% on 02/26/23. She is on glipizide, Mounjaro. She is not currently taking Lantus or Humalog. Last dose of Mounjaro 02/22/2023. At PAT instructed to not take her next dose Friday, July 12th and hold until after surgery.  She has a [employer] 3 continuous glucose monitor.  Anesthesia team to evaluate on the day of surgery.  She is for urine pregnancy test as indicated on arrival.   VS: BP (!) 146/101   Pulse 96   Temp 36.5 C   Resp 18   Ht 5\' 10"  (1.778 m)   Wt 117.9 kg   LMP 02/10/2023 (Exact Date)   SpO2 98%   BMI 37.31 kg/m  BP 146/101. I don't see that it was rechecked, but previous reading good at 120/86 and [id number] within the past 3 months. She is on valsartan 160 mg daily. She had also been on amlodipine 2.5 mg but this was discontinued on 12/18/22 because of feeling fatigue (home BP 110-126/76-80).  BP Readings from Last 3 Encounters:  02/26/23 (!) 146/101  12/20/22 120/86  12/14/22 110/76     PROVIDERS: Doreene Nest, NP is PCP Altamese Fairbury, MD is endocrinologist Tinnie Gens, MD is GYN - She is not followed routinely by cardiology but had an evaluation for atypical chest pain in November 2021 by Rollene Rotunda, MD. She had a normal ETT. As  needed follow-up recommended.   LABS: Labs reviewed: Acceptable for surgery. (all labs ordered are listed, but only abnormal results are displayed)  Labs Reviewed  GLUCOSE, CAPILLARY - Abnormal; Notable for the following components:      Result Value   Glucose-Capillary 108 (*)    All other components within normal limits  HEMOGLOBIN A1C - Abnormal; Notable for the following components:   Hgb A1c MFr Bld 6.5 (*)    All other components within normal limits  BASIC METABOLIC PANEL - Abnormal; Notable for the following components:   Glucose, Bld 109 (*)    All other components within normal limits  CBC - Abnormal; Notable for the following components:   Hemoglobin 11.1 (*)    MCV 75.3 (*)    MCH 22.1 (*)    MCHC 29.3 (*)    All other components within normal limits  TYPE AND SCREEN    IMAGES: CT Facial Bone 01/22/23 ([facility]): 1. Hyperplastic and clear paranasal sinuses. Pronounced leftward  nasal septal deviation and spurring.  2. Enlarged, scalloped bony sella turcica compatible with known  pituitary tumor. Thinning of the left sphenoid sinus wall at the  left carotid canal (coronal image 79), but no overt skull base  invasion or destruction.   MRI Brain 12/06/22: IMPRESSION: 17 mm solid and cystic pituitary mass compatible with adenoma. There is suprasellar involvement with chiasmatic impingement.  CXR 07/26/22: FINDINGS: Cardiac silhouette and mediastinal contours are within normal limits. The lungs are clear. No pleural effusion or pneumothorax. No acute skeletal abnormality. IMPRESSION: No active cardiopulmonary disease.   EKG: 10/11/2022: Sinus Rhythm  WITHIN NORMAL LIMITS   CV: ETT 07/28/20: There was no ST segment deviation noted during stress.   Normal ETT Normal Hemodynamic Response Rare PVCls with stress    Past Medical History:  Diagnosis Date   Acute conjunctivitis of right eye 07/10/2019   Acute pain of right knee 08/06/2019   Anemia  10/14/2019   Complicated UTI (urinary tract infection) 03/10/2021   COVID 2020   had pneumonia   COVID-19    Diabetes mellitus without complication (HCC)    Eczema    Hernia cerebri (HCC)    Hidradenitis axillaris 04/20/2020   Followed by [facility]. Last seen 05/26/2020: instructed to wash b/l with Hibiclens and take Doxycycline as prescribed and follow up in one month.    History of COVID-19 12/21/2019   History of gestational diabetes 05/02/2017   Normal pp testing 10/2017   History of severe pre-eclampsia 05/02/2017   Hypertension    on no medications   Impingement syndrome of left shoulder 02/16/2020   knee   Mastitis 06/18/2019   Migraine    PCOS (polycystic ovarian syndrome)    Pneumonia    Pregnancy induced hypertension    Skin lesions 09/12/2020    Past Surgical History:  Procedure Laterality Date   CESAREAN SECTION  2017   Procedure: CESAREAN DELIVERY ONLY; Surgeon: Marian Sorrow, MD; Location: C-SECTION SUITE [facility]; Service: Obstetrics   CESAREAN SECTION N/A 10/02/2017   Procedure: CESAREAN SECTION;  Surgeon: Tilda Burrow, MD;  Location: [facility];  Service: Obstetrics;  Laterality: N/A;   DILATION AND EVACUATION N/A 03/06/2022   Procedure: DILATATION AND EVACUATION;  Surgeon: Hermina Staggers, MD;  Location: [facility] OR;  Service: Gynecology;  Laterality: N/A;   I & D EXTREMITY Left 10/14/2020   Procedure: Irrigation and debridement left small and ring finger with repair of tendon;  Surgeon: Dominica Severin, MD;  Location: [facility];  Service: Orthopedics;  Laterality: Left;  1 hr Block with IV Sed   LAPAROSCOPIC OOPHERECTOMY Left    LEEP     OVARIAN CYST REMOVAL     TONSILLECTOMY     As a child   WISDOM TOOTH EXTRACTION      MEDICATIONS:  aspirin-acetaminophen-caffeine (EXCEDRIN MIGRAINE) 250-250-65 MG tablet   Continuous Blood Gluc Sensor (FREESTYLE LIBRE 3 SENSOR) MISC   glipiZIDE (GLUCOTROL XL) 5 MG 24 hr tablet   insulin glargine  (LANTUS SOLOSTAR) 100 UNIT/ML Solostar Pen   insulin lispro (HUMALOG KWIKPEN) 100 UNIT/ML KwikPen   Insulin Pen Needle (TECHLITE PEN NEEDLES) 32G X 4 MM MISC   norethindrone (MICRONOR) 0.35 MG tablet   tirzepatide (MOUNJARO) 12.5 MG/0.5ML Pen   valsartan (DIOVAN) 160 MG tablet   No current facility-administered medications for this encounter.    Shonna Chock, PA-C Surgical Short Stay/Anesthesiology [facility] Phone [phone] [facility] Phone [phone] 02/27/2023 2:51 PM

## 2023-02-28 DIAGNOSIS — E1165 Type 2 diabetes mellitus with hyperglycemia: Secondary | ICD-10-CM

## 2023-03-01 ENCOUNTER — Other Ambulatory Visit: Payer: Self-pay | Admitting: Primary Care

## 2023-03-01 DIAGNOSIS — E1165 Type 2 diabetes mellitus with hyperglycemia: Secondary | ICD-10-CM

## 2023-03-01 MED ORDER — FREESTYLE LIBRE 3 SENSOR MISC
3 refills | Status: DC
Start: 2023-03-01 — End: 2023-06-12

## 2023-03-06 ENCOUNTER — Inpatient Hospital Stay (HOSPITAL_COMMUNITY)
Admission: RE | Admit: 2023-03-06 | Discharge: 2023-03-13 | DRG: 615 | Disposition: A | Discharge status: Home / Self Care | Payer: 59 | Attending: Neurological Surgery | Admitting: Neurological Surgery

## 2023-03-06 ENCOUNTER — Other Ambulatory Visit: Payer: Self-pay

## 2023-03-06 ENCOUNTER — Inpatient Hospital Stay (HOSPITAL_COMMUNITY)
Admission: RE | Disposition: A | Discharge status: Home / Self Care | Payer: Self-pay | Source: Home / Self Care | Attending: Neurological Surgery

## 2023-03-06 ENCOUNTER — Inpatient Hospital Stay (HOSPITAL_COMMUNITY): Payer: 59 | Admitting: Vascular Surgery

## 2023-03-06 ENCOUNTER — Inpatient Hospital Stay (HOSPITAL_COMMUNITY): Payer: 59

## 2023-03-06 ENCOUNTER — Encounter (HOSPITAL_COMMUNITY): Payer: Self-pay | Admitting: Neurological Surgery

## 2023-03-06 DIAGNOSIS — I10 Essential (primary) hypertension: Secondary | ICD-10-CM | POA: Diagnosis present

## 2023-03-06 DIAGNOSIS — D352 Benign neoplasm of pituitary gland: Secondary | ICD-10-CM

## 2023-03-06 DIAGNOSIS — E282 Polycystic ovarian syndrome: Secondary | ICD-10-CM | POA: Diagnosis present

## 2023-03-06 DIAGNOSIS — E893 Postprocedural hypopituitarism: Secondary | ICD-10-CM | POA: Diagnosis present

## 2023-03-06 DIAGNOSIS — E119 Type 2 diabetes mellitus without complications: Secondary | ICD-10-CM | POA: Diagnosis present

## 2023-03-06 DIAGNOSIS — L732 Hidradenitis suppurativa: Secondary | ICD-10-CM | POA: Diagnosis present

## 2023-03-06 DIAGNOSIS — Z833 Family history of diabetes mellitus: Secondary | ICD-10-CM

## 2023-03-06 DIAGNOSIS — Z8616 Personal history of COVID-19: Secondary | ICD-10-CM | POA: Diagnosis not present

## 2023-03-06 DIAGNOSIS — R519 Headache, unspecified: Secondary | ICD-10-CM | POA: Diagnosis not present

## 2023-03-06 DIAGNOSIS — N912 Amenorrhea, unspecified: Secondary | ICD-10-CM | POA: Diagnosis present

## 2023-03-06 DIAGNOSIS — J342 Deviated nasal septum: Secondary | ICD-10-CM | POA: Diagnosis present

## 2023-03-06 DIAGNOSIS — Z01818 Encounter for other preprocedural examination: Secondary | ICD-10-CM

## 2023-03-06 HISTORY — PX: CRANIOTOMY: SHX93

## 2023-03-06 HISTORY — PX: TRANSPHENOIDAL APPROACH EXPOSURE: SHX6311

## 2023-03-06 LAB — CBC
HCT: 35.1 % — ABNORMAL LOW (ref 36.0–46.0)
Hemoglobin: 10.6 g/dL — ABNORMAL LOW (ref 12.0–15.0)
MCH: 22.4 pg — ABNORMAL LOW (ref 26.0–34.0)
MCHC: 30.2 g/dL (ref 30.0–36.0)
MCV: 74.1 fL — ABNORMAL LOW (ref 80.0–100.0)
Platelets: 244 10*3/uL (ref 150–400)
RBC: 4.74 MIL/uL (ref 3.87–5.11)
RDW: 14.7 % (ref 11.5–15.5)
WBC: 12 10*3/uL — ABNORMAL HIGH (ref 4.0–10.5)
nRBC: 0 % (ref 0.0–0.2)

## 2023-03-06 LAB — POCT PREGNANCY, URINE: Preg Test, Ur: NEGATIVE

## 2023-03-06 LAB — GLUCOSE, CAPILLARY
Glucose-Capillary: 146 mg/dL — ABNORMAL HIGH (ref 70–99)
Glucose-Capillary: 178 mg/dL — ABNORMAL HIGH (ref 70–99)

## 2023-03-06 LAB — CREATININE, SERUM
Creatinine, Ser: 0.72 mg/dL (ref 0.44–1.00)
GFR, Estimated: >60 mL/min (ref >60)

## 2023-03-06 SURGERY — CRANIOTOMY HYPOPHYSECTOMY TRANSNASAL APPROACH
Anesthesia: General | Site: Head

## 2023-03-06 MED ORDER — BACITRACIN ZINC 500 UNIT/GM EX OINT
TOPICAL_OINTMENT | CUTANEOUS | Status: AC
  Filled 2023-03-06: qty 28.35

## 2023-03-06 MED ORDER — ESMOLOL HCL 100 MG/10ML IV SOLN
INTRAVENOUS | Status: DC | PRN
  Administered 2023-03-06: 40 mg via INTRAVENOUS

## 2023-03-06 MED ORDER — ORAL CARE MOUTH RINSE
15.0000 mL | Freq: Once | OROMUCOSAL | Status: AC
  Administered 2023-03-06: 15 mL via OROMUCOSAL

## 2023-03-06 MED ORDER — OXYCODONE HCL 5 MG PO TABS: 5.0000 mg | ORAL_TABLET | Freq: Once | ORAL | Status: DC | PRN

## 2023-03-06 MED ORDER — PROPOFOL 10 MG/ML IV BOLUS
INTRAVENOUS | Status: AC
  Filled 2023-03-06: qty 20

## 2023-03-06 MED ORDER — HEPARIN SODIUM (PORCINE) 5000 UNIT/ML IJ SOLN
5000.0000 [IU] | Freq: Three times a day (TID) | INTRAMUSCULAR | Status: DC
  Administered 2023-03-08 – 2023-03-13 (×17): 5000 [IU] via SUBCUTANEOUS
  Filled 2023-03-06 (×17): qty 1

## 2023-03-06 MED ORDER — ACETAMINOPHEN 650 MG RE SUPP: 650.0000 mg | RECTAL | Status: DC | PRN

## 2023-03-06 MED ORDER — METOPROLOL TARTRATE 5 MG/5ML IV SOLN
2.5000 mg | Freq: Once | INTRAVENOUS | Status: AC
  Administered 2023-03-06: 2.5 mg via INTRAVENOUS

## 2023-03-06 MED ORDER — THROMBIN 5000 UNITS EX SOLR
CUTANEOUS | Status: AC
  Filled 2023-03-06: qty 5000

## 2023-03-06 MED ORDER — ONDANSETRON HCL 4 MG PO TABS: 4.0000 mg | ORAL_TABLET | ORAL | Status: DC | PRN

## 2023-03-06 MED ORDER — SODIUM CHLORIDE 0.9 % IV SOLN
0.1500 ug/kg/min | Freq: Once | INTRAVENOUS | Status: AC
  Administered 2023-03-06: .05 ug/kg/min via INTRAVENOUS
  Administered 2023-03-06: .2 ug/kg/min via INTRAVENOUS

## 2023-03-06 MED ORDER — MIDAZOLAM HCL 2 MG/2ML IJ SOLN
INTRAMUSCULAR | Status: DC | PRN
  Administered 2023-03-06: 2 mg via INTRAVENOUS

## 2023-03-06 MED ORDER — HYDROMORPHONE HCL 1 MG/ML IJ SOLN
0.2500 mg | INTRAMUSCULAR | Status: DC | PRN
  Administered 2023-03-06 (×4): 0.5 mg via INTRAVENOUS

## 2023-03-06 MED ORDER — ALBUMIN HUMAN 5 % IV SOLN: INTRAVENOUS | Status: DC | PRN

## 2023-03-06 MED ORDER — LACTATED RINGERS IV SOLN: INTRAVENOUS | Status: DC

## 2023-03-06 MED ORDER — DEXAMETHASONE SODIUM PHOSPHATE 10 MG/ML IJ SOLN
INTRAMUSCULAR | Status: DC | PRN
  Administered 2023-03-06: 5 mg via INTRAVENOUS

## 2023-03-06 MED ORDER — FENTANYL CITRATE (PF) 250 MCG/5ML IJ SOLN
INTRAMUSCULAR | Status: AC
  Filled 2023-03-06: qty 5

## 2023-03-06 MED ORDER — EPHEDRINE 5 MG/ML INJ
INTRAVENOUS | Status: AC
  Filled 2023-03-06: qty 5

## 2023-03-06 MED ORDER — CHLORHEXIDINE GLUCONATE 0.12 % MT SOLN: 15.0000 mL | Freq: Once | OROMUCOSAL | Status: AC

## 2023-03-06 MED ORDER — ROCURONIUM BROMIDE 10 MG/ML (PF) SYRINGE
PREFILLED_SYRINGE | INTRAVENOUS | Status: AC
  Filled 2023-03-06: qty 10

## 2023-03-06 MED ORDER — ACETAMINOPHEN 10 MG/ML IV SOLN
1000.0000 mg | Freq: Once | INTRAVENOUS | Status: DC | PRN
  Administered 2023-03-06: 1000 mg via INTRAVENOUS

## 2023-03-06 MED ORDER — OXYMETAZOLINE HCL 0.05 % NA SOLN
1.0000 | Freq: Two times a day (BID) | NASAL | Status: AC | PRN
  Administered 2023-03-08: 1 via NASAL
  Filled 2023-03-06: qty 30

## 2023-03-06 MED ORDER — EPINEPHRINE HCL (NASAL) 0.1 % NA SOLN
NASAL | Status: DC | PRN
  Administered 2023-03-06: 1 [drp] via NASAL

## 2023-03-06 MED ORDER — HYDROMORPHONE HCL 1 MG/ML IJ SOLN
0.5000 mg | INTRAMUSCULAR | Status: DC | PRN
  Administered 2023-03-06 – 2023-03-09 (×7): 0.5 mg via INTRAVENOUS
  Filled 2023-03-06 (×2): qty 0.5
  Filled 2023-03-06 (×4): qty 1
  Filled 2023-03-06: qty 0.5

## 2023-03-06 MED ORDER — SUCCINYLCHOLINE CHLORIDE 200 MG/10ML IV SOSY
PREFILLED_SYRINGE | INTRAVENOUS | Status: AC
  Filled 2023-03-06: qty 10

## 2023-03-06 MED ORDER — POLYETHYLENE GLYCOL 3350 17 G PO PACK
17.0000 g | PACK | Freq: Every day | ORAL | Status: DC | PRN
  Administered 2023-03-11: 17 g via ORAL
  Filled 2023-03-06: qty 1

## 2023-03-06 MED ORDER — ACETAMINOPHEN 325 MG PO TABS
650.0000 mg | ORAL_TABLET | ORAL | Status: DC | PRN
  Administered 2023-03-09 – 2023-03-12 (×6): 650 mg via ORAL
  Filled 2023-03-06 (×6): qty 2

## 2023-03-06 MED ORDER — ACETAMINOPHEN 160 MG/5ML PO SOLN: 1000.0000 mg | Freq: Once | ORAL | Status: DC | PRN

## 2023-03-06 MED ORDER — PHENYLEPHRINE 80 MCG/ML (10ML) SYRINGE FOR IV PUSH (FOR BLOOD PRESSURE SUPPORT)
PREFILLED_SYRINGE | INTRAVENOUS | Status: DC | PRN
  Administered 2023-03-06: 160 ug via INTRAVENOUS

## 2023-03-06 MED ORDER — ONDANSETRON HCL 4 MG/2ML IJ SOLN
INTRAMUSCULAR | Status: DC | PRN
  Administered 2023-03-06: 4 mg via INTRAVENOUS

## 2023-03-06 MED ORDER — SODIUM CHLORIDE 0.9 % IR SOLN
Status: DC | PRN
  Administered 2023-03-06: 2000 mL

## 2023-03-06 MED ORDER — EPINEPHRINE HCL (NASAL) 0.1 % NA SOLN
NASAL | Status: AC
  Filled 2023-03-06: qty 60

## 2023-03-06 MED ORDER — CLEVIDIPINE BUTYRATE 0.5 MG/ML IV EMUL
INTRAVENOUS | Status: DC | PRN
  Administered 2023-03-06: 2 mg/h via INTRAVENOUS

## 2023-03-06 MED ORDER — PHENYLEPHRINE HCL-NACL 20-0.9 MG/250ML-% IV SOLN
INTRAVENOUS | Status: DC | PRN
  Administered 2023-03-06 (×2): 40 ug/min via INTRAVENOUS

## 2023-03-06 MED ORDER — ADHERUS DURAL SEALANT
PACK | TOPICAL | Status: DC | PRN
  Administered 2023-03-06 (×2): 1

## 2023-03-06 MED ORDER — LIDOCAINE 2% (20 MG/ML) 5 ML SYRINGE
INTRAMUSCULAR | Status: AC
  Filled 2023-03-06: qty 5

## 2023-03-06 MED ORDER — INSULIN ASPART 100 UNIT/ML IJ SOLN: 0.0000 [IU] | INTRAMUSCULAR | Status: DC | PRN

## 2023-03-06 MED ORDER — VANCOMYCIN HCL 1500 MG/300ML IV SOLN: 1500.0000 mg | INTRAVENOUS | Status: DC

## 2023-03-06 MED ORDER — PROPOFOL 10 MG/ML IV BOLUS
INTRAVENOUS | Status: DC | PRN
  Administered 2023-03-06: 150 mg via INTRAVENOUS
  Administered 2023-03-06: 150 ug/kg/min via INTRAVENOUS
  Administered 2023-03-06: 50 mg via INTRAVENOUS
  Administered 2023-03-06: 100 ug/kg/min via INTRAVENOUS
  Administered 2023-03-06: 50 mg via INTRAVENOUS
  Administered 2023-03-06: 75 ug/kg/min via INTRAVENOUS

## 2023-03-06 MED ORDER — ACETAMINOPHEN 10 MG/ML IV SOLN
INTRAVENOUS | Status: AC
  Filled 2023-03-06: qty 100

## 2023-03-06 MED ORDER — SODIUM CHLORIDE 0.9 % IV SOLN
0.1500 ug/kg/min | Freq: Once | INTRAVENOUS | Status: DC
  Filled 2023-03-06 (×2): qty 2000

## 2023-03-06 MED ORDER — ONDANSETRON HCL 4 MG/2ML IJ SOLN
INTRAMUSCULAR | Status: AC
  Filled 2023-03-06: qty 2

## 2023-03-06 MED ORDER — CLEVIDIPINE BUTYRATE 0.5 MG/ML IV EMUL
0.0000 mg/h | INTRAVENOUS | Status: DC
  Administered 2023-03-06: 7 mg/h via INTRAVENOUS

## 2023-03-06 MED ORDER — HYDROMORPHONE HCL 1 MG/ML IJ SOLN
INTRAMUSCULAR | Status: DC | PRN
  Administered 2023-03-06 (×2): .25 mg via INTRAVENOUS

## 2023-03-06 MED ORDER — LIDOCAINE-EPINEPHRINE 1 %-1:100000 IJ SOLN
INTRAMUSCULAR | Status: AC
  Filled 2023-03-06: qty 1

## 2023-03-06 MED ORDER — CLEVIDIPINE BUTYRATE 0.5 MG/ML IV EMUL
INTRAVENOUS | Status: AC
  Filled 2023-03-06: qty 50

## 2023-03-06 MED ORDER — HYDROMORPHONE HCL 1 MG/ML IJ SOLN
INTRAMUSCULAR | Status: AC
  Filled 2023-03-06: qty 0.5

## 2023-03-06 MED ORDER — ONDANSETRON HCL 4 MG/2ML IJ SOLN
4.0000 mg | INTRAMUSCULAR | Status: DC | PRN
  Administered 2023-03-07 – 2023-03-09 (×3): 4 mg via INTRAVENOUS
  Filled 2023-03-06 (×3): qty 2

## 2023-03-06 MED ORDER — CLEVIDIPINE BUTYRATE 0.5 MG/ML IV EMUL
INTRAVENOUS | Status: AC
  Administered 2023-03-06: 7 mg/h via INTRAVENOUS
  Filled 2023-03-06: qty 50

## 2023-03-06 MED ORDER — MIDAZOLAM HCL 2 MG/2ML IJ SOLN
INTRAMUSCULAR | Status: AC
  Filled 2023-03-06: qty 2

## 2023-03-06 MED ORDER — PHENYLEPHRINE 80 MCG/ML (10ML) SYRINGE FOR IV PUSH (FOR BLOOD PRESSURE SUPPORT)
PREFILLED_SYRINGE | INTRAVENOUS | Status: AC
  Filled 2023-03-06: qty 10

## 2023-03-06 MED ORDER — ACETAMINOPHEN 500 MG PO TABS: 1000.0000 mg | ORAL_TABLET | Freq: Once | ORAL | Status: DC | PRN

## 2023-03-06 MED ORDER — DIPHENHYDRAMINE HCL 50 MG/ML IJ SOLN
INTRAMUSCULAR | Status: AC
  Filled 2023-03-06: qty 1

## 2023-03-06 MED ORDER — LABETALOL HCL 5 MG/ML IV SOLN
10.0000 mg | INTRAVENOUS | Status: DC | PRN
  Administered 2023-03-09: 10 mg via INTRAVENOUS
  Filled 2023-03-06: qty 4

## 2023-03-06 MED ORDER — PROMETHAZINE HCL 25 MG PO TABS: 12.5000 mg | ORAL_TABLET | ORAL | Status: DC | PRN

## 2023-03-06 MED ORDER — METOPROLOL TARTRATE 5 MG/5ML IV SOLN
INTRAVENOUS | Status: AC
  Filled 2023-03-06: qty 5

## 2023-03-06 MED ORDER — GLIPIZIDE ER 5 MG PO TB24
5.0000 mg | ORAL_TABLET | Freq: Every day | ORAL | Status: DC
  Administered 2023-03-07 – 2023-03-13 (×7): 5 mg via ORAL
  Filled 2023-03-06 (×9): qty 1

## 2023-03-06 MED ORDER — SALINE SPRAY 0.65 % NA SOLN
2.0000 | NASAL | Status: DC
  Administered 2023-03-06 – 2023-03-13 (×34): 2 via NASAL
  Filled 2023-03-06: qty 44

## 2023-03-06 MED ORDER — DOCUSATE SODIUM 100 MG PO CAPS
100.0000 mg | ORAL_CAPSULE | Freq: Two times a day (BID) | ORAL | Status: DC
  Administered 2023-03-06 – 2023-03-13 (×10): 100 mg via ORAL
  Filled 2023-03-06 (×11): qty 1

## 2023-03-06 MED ORDER — ROCURONIUM BROMIDE 10 MG/ML (PF) SYRINGE
PREFILLED_SYRINGE | INTRAVENOUS | Status: DC | PRN
  Administered 2023-03-06: 80 mg via INTRAVENOUS
  Administered 2023-03-06: 20 mg via INTRAVENOUS
  Administered 2023-03-06: 50 mg via INTRAVENOUS
  Administered 2023-03-06: 30 mg via INTRAVENOUS

## 2023-03-06 MED ORDER — HYDROMORPHONE HCL 1 MG/ML IJ SOLN
INTRAMUSCULAR | Status: AC
  Filled 2023-03-06: qty 1

## 2023-03-06 MED ORDER — LIDOCAINE 2% (20 MG/ML) 5 ML SYRINGE
INTRAMUSCULAR | Status: DC | PRN
  Administered 2023-03-06: 60 mg via INTRAVENOUS

## 2023-03-06 MED ORDER — 0.9 % SODIUM CHLORIDE (POUR BTL) OPTIME
TOPICAL | Status: DC | PRN
  Administered 2023-03-06: 1000 mL

## 2023-03-06 MED ORDER — LIDOCAINE-EPINEPHRINE 1 %-1:100000 IJ SOLN
INTRAMUSCULAR | Status: DC | PRN
  Administered 2023-03-06: 2 mL
  Administered 2023-03-06: 10 mL

## 2023-03-06 MED ORDER — THROMBIN 5000 UNITS EX SOLR: OROMUCOSAL | Status: DC | PRN

## 2023-03-06 MED ORDER — OXYCODONE HCL 5 MG/5ML PO SOLN: 5.0000 mg | Freq: Once | ORAL | Status: DC | PRN

## 2023-03-06 MED ORDER — IRBESARTAN 150 MG PO TABS
150.0000 mg | ORAL_TABLET | Freq: Every day | ORAL | Status: DC
  Administered 2023-03-06 – 2023-03-13 (×8): 150 mg via ORAL
  Filled 2023-03-06 (×8): qty 1

## 2023-03-06 MED ORDER — VANCOMYCIN HCL 1500 MG/300ML IV SOLN
1500.0000 mg | INTRAVENOUS | Status: AC
  Administered 2023-03-06: 1500 mg via INTRAVENOUS
  Filled 2023-03-06: qty 300

## 2023-03-06 MED ORDER — THROMBIN 5000 UNITS EX SOLR
CUTANEOUS | Status: AC
  Filled 2023-03-06: qty 10000

## 2023-03-06 MED ORDER — CLEVIDIPINE BUTYRATE 0.5 MG/ML IV EMUL
0.0000 mg/h | INTRAVENOUS | Status: DC
  Filled 2023-03-06: qty 50

## 2023-03-06 MED ORDER — DEXAMETHASONE SODIUM PHOSPHATE 10 MG/ML IJ SOLN
INTRAMUSCULAR | Status: AC
  Filled 2023-03-06: qty 1

## 2023-03-06 MED ORDER — HYDROCODONE-ACETAMINOPHEN 5-325 MG PO TABS
1.0000 | ORAL_TABLET | ORAL | Status: DC | PRN
  Administered 2023-03-06 – 2023-03-11 (×17): 1 via ORAL
  Filled 2023-03-06 (×17): qty 1

## 2023-03-06 MED ORDER — SODIUM CHLORIDE 0.9 % IV SOLN: INTRAVENOUS | Status: DC | PRN

## 2023-03-06 MED ORDER — FAMOTIDINE IN NACL 20-0.9 MG/50ML-% IV SOLN
20.0000 mg | Freq: Two times a day (BID) | INTRAVENOUS | Status: DC
  Administered 2023-03-06 – 2023-03-07 (×2): 20 mg via INTRAVENOUS
  Filled 2023-03-06 (×3): qty 50

## 2023-03-06 MED ORDER — THROMBIN (RECOMBINANT) 5000 UNITS EX SOLR: CUTANEOUS | Status: DC | PRN

## 2023-03-06 SURGICAL SUPPLY — 122 items
APL SKNCLS STERI-STRIP NONHPOA (GAUZE/BANDAGES/DRESSINGS)
ATTRACTOMAT 16X20 MAGNETIC DRP (DRAPES) ×3 IMPLANT
BAG COUNTER SPONGE SURGICOUNT (BAG) ×6 IMPLANT
BAG SPNG CNTER NS LX DISP (BAG) ×4
BENZOIN TINCTURE PRP APPL 2/3 (GAUZE/BANDAGES/DRESSINGS) ×3 IMPLANT
BLADE RAD40 ROTATE 4M 4 5PK (BLADE) IMPLANT
BLADE ROTATE TRICUT 4X13 M4 (BLADE) ×3 IMPLANT
BLADE SURG 10 STRL SS (BLADE) ×3 IMPLANT
BLADE SURG 11 STRL SS (BLADE) ×6 IMPLANT
BLADE SURG 15 STRL LF DISP TIS (BLADE) ×6 IMPLANT
BLADE SURG 15 STRL SS (BLADE)
BUR DIAMOND 13X5 70D (BURR) IMPLANT
BUR DIAMOND CURV 15X5 15D (BURR) IMPLANT
BUR TAPER CHOANAL ATRESIA 30K (BURR) ×3 IMPLANT
CABLE BIPOLOR RESECTION CORD (MISCELLANEOUS) ×3 IMPLANT
CANISTER SUCT 3000ML PPV (MISCELLANEOUS) ×9 IMPLANT
CATH LUMBAR HERMETIC 14G (CATHETERS) ×3 IMPLANT
CATHETER LUMBAR HERMETIC 14G (CATHETERS) ×2
CNTNR URN SCR LID CUP LEK RST (MISCELLANEOUS) IMPLANT
COAGULATOR SUCT SWTCH 10FR 6 (ELECTROSURGICAL) IMPLANT
CONT SPEC 4OZ STRL OR WHT (MISCELLANEOUS) ×2
COVER BACK TABLE 60X90IN (DRAPES) IMPLANT
COVER MAYO STAND STRL (DRAPES) ×3 IMPLANT
DEFOGGER MIRROR 1QT (MISCELLANEOUS) ×3 IMPLANT
DRAPE HALF SHEET 40X57 (DRAPES) ×6 IMPLANT
DRAPE INCISE IOBAN 66X45 STRL (DRAPES) ×3 IMPLANT
DRAPE MICROSCOPE SLANT 54X150 (MISCELLANEOUS) IMPLANT
DRAPE SURG 17X23 STRL (DRAPES) ×9 IMPLANT
DRESSING NASAL POPE 10X1.5X2.5 (GAUZE/BANDAGES/DRESSINGS) IMPLANT
DRSG NASAL POPE 10X1.5X2.5 (GAUZE/BANDAGES/DRESSINGS) ×2
DURAPREP 26ML APPLICATOR (WOUND CARE) ×3 IMPLANT
ELECT COATED BLADE 2.86 ST (ELECTRODE) IMPLANT
ELECT NDL TIP 2.8 STRL (NEEDLE) ×3 IMPLANT
ELECT NEEDLE TIP 2.8 STRL (NEEDLE) ×2 IMPLANT
ELECT REM PT RETURN 9FT ADLT (ELECTROSURGICAL) ×2
ELECTRODE NDL INSULATED 6.5 (ELECTROSURGICAL) IMPLANT
ELECTRODE REM PT RTRN 9FT ADLT (ELECTROSURGICAL) ×6 IMPLANT
GAUZE PACKING FOLDED 2 STR (GAUZE/BANDAGES/DRESSINGS) ×3 IMPLANT
GAUZE SPONGE 2X2 8PLY STRL LF (GAUZE/BANDAGES/DRESSINGS) ×3 IMPLANT
GAUZE SPONGE 4X4 12PLY STRL (GAUZE/BANDAGES/DRESSINGS) ×3 IMPLANT
GLOVE BIO SURGEON STRL SZ 6.5 (GLOVE) ×3 IMPLANT
GLOVE BIO SURGEON STRL SZ7.5 (GLOVE) ×3 IMPLANT
GLOVE BIOGEL PI IND STRL 7.0 (GLOVE) ×3 IMPLANT
GLOVE BIOGEL PI IND STRL 7.5 (GLOVE) ×3 IMPLANT
GLOVE ECLIPSE 7.5 STRL STRAW (GLOVE) ×3 IMPLANT
GLOVE EXAM NITRILE LRG STRL (GLOVE) IMPLANT
GLOVE EXAM NITRILE XL STR (GLOVE) IMPLANT
GLOVE EXAM NITRILE XS STR PU (GLOVE) IMPLANT
GLOVE SURG LTX SZ7.5 (GLOVE) ×3 IMPLANT
GLOVE SURG SS PI 7.0 STRL IVOR (GLOVE) IMPLANT
GOWN STRL REUS W/ TWL LRG LVL3 (GOWN DISPOSABLE) ×6 IMPLANT
GOWN STRL REUS W/ TWL XL LVL3 (GOWN DISPOSABLE) IMPLANT
GOWN STRL REUS W/TWL 2XL LVL3 (GOWN DISPOSABLE) ×3 IMPLANT
GOWN STRL REUS W/TWL LRG LVL3 (GOWN DISPOSABLE) ×10
GOWN STRL REUS W/TWL XL LVL3 (GOWN DISPOSABLE)
HEMOSTAT ARISTA ABSORB 3G PWDR (HEMOSTASIS) IMPLANT
HEMOSTAT POWDER KIT SURGIFOAM (HEMOSTASIS) ×3 IMPLANT
HEMOSTAT SURGICEL 2X14 (HEMOSTASIS) IMPLANT
IV NS 1000ML (IV SOLUTION) ×4
IV NS 1000ML BAXH (IV SOLUTION) ×6 IMPLANT
KIT BASIN OR (CUSTOM PROCEDURE TRAY) ×6 IMPLANT
KIT DRAIN CSF ACCUDRAIN (MISCELLANEOUS) ×3 IMPLANT
KIT TURNOVER KIT B (KITS) ×6 IMPLANT
KNIFE ARACHNOID DISP AM-21-S (BLADE) IMPLANT
NDL HYPO 18GX1.5 BLUNT FILL (NEEDLE) ×3 IMPLANT
NDL HYPO 25GX1X1/2 BEV (NEEDLE) ×6 IMPLANT
NDL HYPO 25X1 1.5 SAFETY (NEEDLE) ×3 IMPLANT
NDL SPNL 22GX3.5 QUINCKE BK (NEEDLE) ×3 IMPLANT
NDL SPNL 25GX3.5 QUINCKE BL (NEEDLE) ×3 IMPLANT
NEEDLE HYPO 18GX1.5 BLUNT FILL (NEEDLE) ×2 IMPLANT
NEEDLE HYPO 25GX1X1/2 BEV (NEEDLE) ×4 IMPLANT
NEEDLE HYPO 25X1 1.5 SAFETY (NEEDLE) ×2 IMPLANT
NEEDLE SPNL 22GX3.5 QUINCKE BK (NEEDLE) ×2 IMPLANT
NEEDLE SPNL 25GX3.5 QUINCKE BL (NEEDLE) ×2 IMPLANT
NS IRRIG 1000ML POUR BTL (IV SOLUTION) ×6 IMPLANT
PACK LAMINECTOMY NEURO (CUSTOM PROCEDURE TRAY) IMPLANT
PAD ARMBOARD 7.5X6 YLW CONV (MISCELLANEOUS) ×9 IMPLANT
PATTIES SURGICAL .25X.25 (GAUZE/BANDAGES/DRESSINGS) IMPLANT
PATTIES SURGICAL .5 X.5 (GAUZE/BANDAGES/DRESSINGS) ×3 IMPLANT
PATTIES SURGICAL .5 X3 (DISPOSABLE) ×6 IMPLANT
PENCIL BUTTON HOLSTER BLD 10FT (ELECTRODE) ×3 IMPLANT
PROBE FOR NEUROSURGERY (MISCELLANEOUS) IMPLANT
SEALANT ADHERUS EXTEND TIP (MISCELLANEOUS) IMPLANT
SHEATH ENDOSCRUB 0 DEG (SHEATH) ×3 IMPLANT
SHEATH ENDOSCRUB 30 DEG (SHEATH) IMPLANT
SHEATH ENDOSCRUB 45 DEG (SHEATH) IMPLANT
SOL ANTI FOG 6CC (MISCELLANEOUS) IMPLANT
SOL ELECTROSURG ANTI STICK (MISCELLANEOUS)
SOLUTION ELECTROSURG ANTI STCK (MISCELLANEOUS) ×3 IMPLANT
SPECIMEN JAR SMALL (MISCELLANEOUS) IMPLANT
SPLINT NASAL DOYLE BI-VL (GAUZE/BANDAGES/DRESSINGS) IMPLANT
SPLINT NASAL POSISEP X .6X2 (GAUZE/BANDAGES/DRESSINGS) IMPLANT
SPLINT NASAL POSISEP X2 .8X2.3 (GAUZE/BANDAGES/DRESSINGS) IMPLANT
SPONGE SURGIFOAM ABS GEL SZ50 (HEMOSTASIS) IMPLANT
SPONGE T-LAP 4X18 ~~LOC~~+RFID (SPONGE) ×3 IMPLANT
STAPLER SKIN PROX WIDE 3.9 (STAPLE) ×3 IMPLANT
STRIP CLOSURE SKIN 1/2X4 (GAUZE/BANDAGES/DRESSINGS) ×3 IMPLANT
SUCTION TUBE FRAZIER 10FR DISP (SUCTIONS) ×3 IMPLANT
SUT BONE WAX W31G (SUTURE) ×3 IMPLANT
SUT CHROMIC 4 0 SH 27 (SUTURE) IMPLANT
SUT ETHILON 3 0 FSL (SUTURE) IMPLANT
SUT ETHILON 3 0 PS 1 (SUTURE) IMPLANT
SUT ETHILON 6 0 P 1 (SUTURE) IMPLANT
SUT PDS AB 4-0 RB1 27 (SUTURE) IMPLANT
SUT PDS PLUS AB 5-0 RB-1 (SUTURE) IMPLANT
SUT PLAIN 4 0 ~~LOC~~ 1 (SUTURE) IMPLANT
SUT VIC AB 4-0 P-3 18X BRD (SUTURE) IMPLANT
SUT VIC AB 4-0 P3 18 (SUTURE)
SYR CONTROL 10ML LL (SYRINGE) ×3 IMPLANT
SYR TB 1ML LUER SLIP (SYRINGE) ×6 IMPLANT
TOWEL GREEN STERILE (TOWEL DISPOSABLE) ×3 IMPLANT
TOWEL GREEN STERILE FF (TOWEL DISPOSABLE) ×6 IMPLANT
TRACKER ENT INSTRUMENT (MISCELLANEOUS) ×6 IMPLANT
TRACKER ENT PATIENT (MISCELLANEOUS) ×3 IMPLANT
TRAP SPECIMEN MUCUS 40CC (MISCELLANEOUS) IMPLANT
TRAY ENT MC OR (CUSTOM PROCEDURE TRAY) ×6 IMPLANT
TRAY FOLEY MTR SLVR 16FR STAT (SET/KITS/TRAYS/PACK) ×3 IMPLANT
TUBE CONNECTING 12X1/4 (SUCTIONS) ×3 IMPLANT
TUBING EXTENTION W/L.L. (IV SETS) IMPLANT
TUBING FEATHERFLOW (TUBING) IMPLANT
TUBING STRAIGHTSHOT EPS 5PK (TUBING) ×3 IMPLANT
WATER STERILE IRR 1000ML POUR (IV SOLUTION) ×3 IMPLANT

## 2023-03-06 NOTE — H&P (Signed)
Surgical H&P Update  HPI: 37 y.o. with a history of amenorrhea with workup showing non-functioning pituitary tumor. PRL with dilution was 47.6. No changes in health since they were last seen. Still having the above and wishes to proceed with surgery.  PMHx:  Past Medical History:  Diagnosis Date   Acute conjunctivitis of right eye 07/10/2019   Acute pain of right knee 08/06/2019   Anemia 10/14/2019   Complicated UTI (urinary tract infection) 03/10/2021   COVID 2020   had pneumonia   COVID-19    Diabetes mellitus without complication (HCC)    Eczema    Hernia cerebri (HCC)    Hidradenitis axillaris 04/20/2020   Followed by [facility]. Last seen 05/26/2020: instructed to wash b/l with Hibiclens and take Doxycycline as prescribed and follow up in one month.    History of COVID-19 12/21/2019   History of gestational diabetes 05/02/2017   Normal pp testing 10/2017   History of severe pre-eclampsia 05/02/2017   Hypertension    Impingement syndrome of left shoulder 02/16/2020   knee   Mastitis 06/18/2019   Migraine    PCOS (polycystic ovarian syndrome)    Pneumonia    Pregnancy induced hypertension    Skin lesions 09/12/2020   FamHx:  Family History  Problem Relation Age of Onset   Diabetes Mother    Migraines Mother    Diabetes Father    Hypertension Father    Hypertension Maternal Grandmother    Diabetes Maternal Grandmother    Diabetes Maternal Grandfather    COPD Maternal Grandfather    Diabetes Paternal Grandmother    Hypertension Paternal Grandmother    Diabetes Paternal Grandfather    Hypertension Paternal Grandfather    Heart attack Paternal Grandfather    Asthma Sister    SocHx:  reports that she has never smoked. She has never used smokeless tobacco. She reports that she does not drink alcohol and does not use drugs.  Physical Exam: Aox3, PERRL, EOMI, FS & SS, VFF to confrontation, strength 5/5x4  Assesment/Plan: 37 y.o. woman with  non-functioning pituitary adenoma, here for endonasal resection. Risks, benefits, and alternatives discussed and the patient would like to continue with surgery.  -OR today -4N post-op  Jadene Pierini, MD 03/06/23 7:47 AM

## 2023-03-06 NOTE — Discharge Instructions (Signed)
[facility] SINUS [facility]GERY ([facility]) Post Operative Instructions  Office: [phone]  The Surgery Itself Endoscopic sinus surgery (with or without septoplasty and turbinate reduction) involves general anesthesia. Patients may be sedated for several hours after surgery and may remain sleepy for the better part of the day. Nausea and vomiting are occasionally seen, and usually resolve by the evening of surgery - even without additional medications.  After Surgery  Facial pressure and fullness similar to a sinus infection/headache is normal after surgery. Breathing through your nose is also difficult due to swelling. A humidifier or vaporizer can be used in the bedroom to prevent throat pain with mouth breathing.   Bloody nasal drainage is normal after this surgery for 5-7 days, usually decreasing in volume with each day that passes. Drainage will flow from the front of the nose and down the back of the throat. Make sure you spit out blood drainage that drips down the back of your throat to prevent nausea/vomiting. You will have a nasal drip pad/sling with gauze to catch drainage from the front of your nose. The dressing may need to be changed frequently during the first 24 hours following surgery. In case of profuse nasal bleeding, you may apply ice to the bridge of the nose and pinch the nose just above the tip and hold for 10 minutes; if bleeding continues, contact the doctors office.   Frequent hot showers or saline nasal rinses (NeilMed) will help break up congestion and clear any clot or mucus that builds up within the nose after surgery. This can be started the day after surgery.   It is more comfortable to sleep with extra pillows or in a recliner for the first few days after surgery until the drainage begins to resolve.    Do not blow your nose for 2 weeks after surgery.   Avoid lifting > 10 lbs. and no vigorous exercise for 2 weeks after surgery.   Avoid airplane travel for 2 weeks  following sinus surgery; the cabin pressure changes can cause pain and swelling within the nose/sinuses.   Sense of smell and taste are often diminished for several weeks after surgery. There may be some tenderness or numbness in your upper front teeth, which is normal after surgery. You may express old clot, discolored mucus or very large nasal crusts from your nose for up to 3-4 weeks after surgery; depending on how frequently and how effectively you irrigate your nose with the saltwater spray.   You may have absorbable sutures inside of your nose after surgery that will slowly dissolve in 2-3 weeks. Be careful when clearing crusts from the nose since they may be attached to these sutures.  Medications  Pain medication can be used for pain as prescribed. Pain and pressure in the nose is expected after surgery. As the surgical site heals, pain will resolve over the course of a week. Pain medications can cause nausea, which can be prevented if you take them with food or milk.   You may be given an antibiotic for one week after surgery to prevent infection. Take this medication with food to prevent nausea or vomiting.   You can use 2 nasal sprays after surgery: Afrin can be used up to 2 times a day for up to 5 days after surgery (best before bed) to reduce bloody drainage from the nose for the first few days after surgery. Saline/salt water spray should be used at least 4-6 times per day, starting the day after surgery  to prevent crusting inside of the nose.   Take all of your routine medications as prescribed, unless told otherwise by your surgeon. Any medications that thin the blood should be avoided. This includes aspirin. Avoid aspirin-like products for the first 72 hours after surgery (Advil, Motrin, Excedrin, Alieve, Celebrex, Naprosyn), but you may use them as needed for pain after 72 hours.

## 2023-03-06 NOTE — Transfer of Care (Signed)
Immediate Anesthesia Transfer of Care Note  Patient: Julia Kline  Procedure(s) Performed: endoscopic endonasal resection of pituitary tumor (Head) TRANSPHENOIDAL APPROACH EXPOSURE  Patient Location: PACU  Anesthesia Type:General  Level of Consciousness: awake, alert , and oriented  Airway & Oxygen Therapy: Patient Spontanous Breathing and Patient connected to face mask oxygen  Post-op Assessment: Report given to RN and Post -op Vital signs reviewed and stable  Post vital signs: Reviewed and stable  Last Vitals:  Vitals Value Taken Time  BP 147/112 03/06/23 1308  Temp 36.7 C 03/06/23 1305  Pulse 112 03/06/23 1313  Resp 13 03/06/23 1313  SpO2 93 % 03/06/23 1313  Vitals shown include unfiled device data.  Last Pain:  Vitals:   03/06/23 1305  TempSrc:   PainSc: Asleep      Patients Stated Pain Goal: 0 (03/06/23 0733)  Complications: No notable events documented.

## 2023-03-06 NOTE — Op Note (Signed)
PATIENT: Julia Kline  DAY OF SURGERY: 03/06/23   PRE-OPERATIVE DIAGNOSIS:  Pituitary adenoma   POST-OPERATIVE DIAGNOSIS:  Pituitary adenoma   PROCEDURE:  Endonasal endoscopic transphenoidal resection of pituitary tumor   SURGEON:  Surgeon(s) and Role:    Julia Pierini, MD - Co-surgeon    Skotnicki, Meghan, MD - Co-surgeon   ANESTHESIA: ETGA   BRIEF HISTORY: This is a 37 year old woman who presented with amenorrhea, workup showed a 17mm cystic pituitary mass that appeared to be non-functioning but was contacting the chiasm. I therefore recommended surgical resection. This was discussed with the patient as well as risks, benefits, and alternatives and wished to proceed with surgical treatment.   OPERATIVE DETAIL: Dr. Marene Lenz performed the nasal portion of the approach and is dictated separately.   For the neurosurgical portion of the case, Dr. Marene Lenz and I used a combination of visual anatomic landmarks and frameless stereotaxy to confirm orientation and anatomy. The dura of the sella was then opened sharply with a #11 blade in a cruciate fashion. Tumor was immediately encountered and samples were taken and sent to pathology for analysis. With Dr. Anders Simmonds assistance while holding the endoscope, the tumor was then resected in a deliberate fashion with bimanual technique using a combination of ring curettes, suction, and penfield #4. It was dissected laterally, then posteriorly. With a good plane developed, ringed curettes were used to remove the inferior portion of the tumor to allow the superior portion to descend into the field. The superior portion was then resected with extra attention paid to the superolateral corners as the diaphragma began to descend. At the end of resection, there was pulsatile diaphragma in the field. There was no evidence of CSF leakage. Hemostasis was confirmed and Adheris ([employer]) was used to fill the sella. Dr. Marene Lenz then took over to  complete the skull base reconstruction portion of the procedure.   EBL:    DRAINS: none   SPECIMENS: Pituitary tumor   Julia Pierini, MD 03/06/23 9:31 AM

## 2023-03-06 NOTE — Op Note (Signed)
OPERATIVE NOTE  Julia Kline Date/Time of Admission: 03/06/2023  6:31 AM  CSN: [id number];XBM:[id number] Attending Provider: Jadene Pierini, MD Room/Bed: MCPO/NONE DOB: 01-Mar-1986 Age: 37 y.o.   Pre-Op Diagnosis: Pituitary tumor  Post-Op Diagnosis: Pituitary tumor  Procedure: Procedure(s): Endoscopic endonasal resection of pituitary tumor Bilateral sphenoidotomy Septoplasty  Anesthesia: General  Surgeon(s): Montell Leopard A Tais Koestner, DO - Co-surgeon Autumn Patty, MD - Co-surgeon   Staff: Circulator: Virgel Bouquet, RN; Ellin Goodie, RN; Lilia Pro, RN Scrub Person: Key, Alessandra Bevels, RN; Coralee North T; Mood, Lennart Pall, CST Vendor Representative : Debbe Mounts  Implants: * No implants in log *  Specimens: ID Type Source Tests Collected by Time Destination  1 : Pituitary Tumor for permanent Tissue PATH Soft tissue SURGICAL PATHOLOGY Jadene Pierini, MD 03/06/2023 1156     Complications: None  EBL: 250 ML  Condition: stable  Operative Findings:  Severe left septal deviation with bony spurring bilaterally  Description of Operation: Once operative consent was obtained and the site and surgery were confirmed with the patient and the operating room team, the patient was brought back to the operating room and general endotracheal anesthesia was obtained. The patient was then turned over to the ENT service, at which time the image-guided system was attached and noted to be in good calibration. Lidocaine 1% with 1:100,000 epinephrine was injected into the nasal septum bilaterally, inferior turbinates bilaterally, the middle turbinates bilaterally, and the axilla between the medial turbinate and the lateral nasal wall. Afrin-soaked pledgets were placed into the nasal cavity, and the patient was prepped and draped in sterile fashion. Attention was first turned to the right nasal vestibule.  The inferior and middle turbinate were gently  lateralized using a Cottle elevator until the superior turbinate was identified.  This was also lateralized until the natural sinus ostium was appreciated.  This was serially dilated using a mushroom punch and an up-biting Kerrison.  A posterior septectomy was then performed using a combination of a Cottle elevator, Tru-Cut forceps and Kerrisons.  A right-sided nasal septal flap was then delineated using electrocautery and elevated using the suction elevator.  Once it was completely freed from the underlying septum, it was gently tucked into the nasopharynx. Due to severe left septal deviation, a septoplasty was deemed necessary in order to approach the tumor bilaterally. A left sided hemi-transfixion incision was made a submucoperichondrial flap was elevated on the left.  A submucous resection of  nasal septal cartilage was performed with care taken to leave a 1 cm caudal and dorsal strut. In doing this, a right-sided submucoperichondrial flap was elevated.  The bony nasal septum was addressed by lifting up the soft tissues, separating the superior septum with a double action scissor and then removing the inferior deflected portion. The nasal spine was removed with a 4mm osteotome. With this completed, the nasal septum was midline. The left middle turbinate and superior turbinate were then identified and gently lateralized until the natural sphenoid os was identified and serially dilated using the sphenoid dilator, mushroom punch and up-biting Kerrisons.  With the bilateral sphenoid cavities in view, the remaining bone of the sphenoid face was removed using Kerrison forceps.  A diamond bur drill was also used to complete the bony resection and to remove the intrasinus septum until the bone overlying the pituitary adenoma was completely visualized.  This bone was removed using Kerrison forceps until the tumor was completely visualized. Dr. Johnsie Cancel then scrubbed in and completely resected the tumor, please  see his  operative note for details regarding the repair.  There was no evidence of CSF leak following tumor removal. Hemostasis was achieved using Floseal and gelfoam. Adheris was used to completely cover the defect. The nasal septal flap was then retrieved from the nasopharynx and returned to its normal anatomic position and sutured in place using 4-0 plain gut suture. The submucoperichondrial flaps were returned to their anatomic position and hemi-transfixion incision was closed with interrupted 4-0 chromic gut. Posisep nasal packing was placed in bilateral nasal cavities in the sphenoid defect.   Bacitracin covered Ralph Leyden nasal splints were placed in the bilateral nasal cavities and sutured to the columella with a 2-0 Silk suture and a nasal drip pad was applied. An orogastric tube was placed and the stomach cavity was suctioned to reduce postoperative nausea. The patient was turned over to anesthesia service and was extubated in the operating room and transferred to the PACU in stable condition.   Laren Boom, DO [facility]  03/06/2023

## 2023-03-06 NOTE — Anesthesia Procedure Notes (Addendum)
Arterial Line Insertion Start/End7/17/2024 9:05 AM, 03/06/2023 9:12 AM Performed by: Val Eagle, MD, Samara Deist, CRNA, CRNA  Patient location: OR. radial was placed Catheter size: 20 G Hand hygiene performed  and Seldinger technique used Allen's test indicative of satisfactory collateral circulation Attempts: 1 Procedure performed without using ultrasound guided technique. Following insertion, dressing applied and Biopatch. Post procedure assessment: normal  Patient tolerated the procedure well with no immediate complications.

## 2023-03-06 NOTE — Anesthesia Procedure Notes (Signed)
Procedure Name: Intubation Date/Time: 03/06/2023 9:00 AM  Performed by: Samara Deist, CRNAPre-anesthesia Checklist: Patient identified, Emergency Drugs available, Suction available and Patient being monitored Patient Re-evaluated:Patient Re-evaluated prior to induction Oxygen Delivery Method: Circle System Utilized Preoxygenation: Pre-oxygenation with 100% oxygen Induction Type: IV induction Ventilation: Mask ventilation without difficulty Laryngoscope Size: Mac Grade View: Grade I Tube type: Oral Number of attempts: 1 Airway Equipment and Method: Stylet Placement Confirmation: ETT inserted through vocal cords under direct vision, positive ETCO2 and breath sounds checked- equal and bilateral Secured at: 21 cm Tube secured with: Tape Dental Injury: Teeth and Oropharynx as per pre-operative assessment  Comments: Performed by sam foster srna

## 2023-03-07 ENCOUNTER — Encounter (HOSPITAL_COMMUNITY): Payer: Self-pay | Admitting: Neurological Surgery

## 2023-03-07 LAB — BASIC METABOLIC PANEL
Anion gap: 7 (ref 5–15)
BUN: 7 mg/dL (ref 6–20)
CO2: 25 mmol/L (ref 22–32)
Calcium: 8.5 mg/dL — ABNORMAL LOW (ref 8.9–10.3)
Chloride: 103 mmol/L (ref 98–111)
Creatinine, Ser: 0.7 mg/dL (ref 0.44–1.00)
GFR, Estimated: >60 mL/min (ref >60)
Glucose, Bld: 233 mg/dL — ABNORMAL HIGH (ref 70–99)
Potassium: 3.6 mmol/L (ref 3.5–5.1)
Sodium: 135 mmol/L (ref 135–145)

## 2023-03-07 MED ORDER — FAMOTIDINE 20 MG PO TABS
20.0000 mg | ORAL_TABLET | Freq: Two times a day (BID) | ORAL | Status: DC
  Administered 2023-03-07 – 2023-03-13 (×12): 20 mg via ORAL
  Filled 2023-03-07 (×12): qty 1

## 2023-03-07 MED FILL — Thrombin For Soln 5000 Unit: CUTANEOUS | Qty: 5000 | Status: AC

## 2023-03-07 MED FILL — Thrombin For Soln 5000 Unit: CUTANEOUS | Qty: 2 | Status: AC

## 2023-03-07 NOTE — Evaluation (Signed)
Occupational Therapy Evaluation Patient Details Name: Julia Kline MRN: [id number] DOB: [birth year] Today's Date: 03/07/2023   History of Present Illness Pt is a 37 y/o female presenting on 7/17 due to non-functioning pituitary adenoma.  S/p resection. PMH includes: UTI, DM, eczema, hernia cerebri, HTN, L shoulder impingement, mastitis, PCOS, PNA.   Clinical Impression   PTA patient independent and working. Admitted for above and presents with problem list below, including decreased activity tolerance, pain.  Session limited by headache, fatigued from just finishing with PT. Completed Adls with up to min guard.  Cognition and vision appear WFL.  Discussed energy conservation and safety.  Will follow acutely, but anticipate no further needs after dc home.      Recommendations for follow up therapy are one component of a multi-disciplinary discharge planning process, led by the attending physician.  Recommendations may be updated based on patient status, additional functional criteria and insurance authorization.   Assistance Recommended at Discharge PRN  Patient can return home with the following A little help with bathing/dressing/bathroom;A little help with walking and/or transfers;Assist for transportation;Assistance with cooking/housework    Functional Status Assessment  Patient has had a recent decline in their functional status and demonstrates the ability to make significant improvements in function in a reasonable and predictable amount of time.  Equipment Recommendations  None recommended by OT    Recommendations for Other Services       Precautions / Restrictions Precautions Precautions: Fall Restrictions Weight Bearing Restrictions: No      Mobility Bed Mobility               General bed mobility comments: OOB upon entry    Transfers Overall transfer level: Needs assistance   Transfers: Sit to/from Stand Sit to Stand: Min guard                   Balance Overall balance assessment: Mild deficits observed, not formally tested                                         ADL either performed or assessed with clinical judgement   ADL Overall ADL's : Needs assistance/impaired     Grooming: Min guard;Standing           Upper Body Dressing : Sitting;Set up   Lower Body Dressing: Min guard;Sit to/from stand   Toilet Transfer: Min guard;Ambulation           Functional mobility during ADLs: Min guard       Vision Patient Visual Report: No change from baseline Vision Assessment?: No apparent visual deficits     Perception     Praxis      Pertinent Vitals/Pain Pain Assessment Pain Assessment: 0-10 Pain Score: 9  Pain Location: headache Pain Descriptors / Indicators: Headache Pain Intervention(s): Limited activity within patient's tolerance, Monitored during session, Premedicated before session     Hand Dominance Right   Extremity/Trunk Assessment Upper Extremity Assessment Upper Extremity Assessment: Overall WFL for tasks assessed   Lower Extremity Assessment Lower Extremity Assessment: Defer to PT evaluation       Communication Communication Communication: No difficulties   Cognition Arousal/Alertness: Awake/alert Behavior During Therapy: WFL for tasks assessed/performed Overall Cognitive Status: Within Functional Limits for tasks assessed  General Comments  spouse at side, discussed energy conservation and pacing with ADLs    Exercises     Shoulder Instructions      Home Living Family/patient expects to be discharged to:: Private residence Living Arrangements: Spouse/significant other;Children (5 and 7 yo children) Available Help at Discharge: Family;Available 24 hours/day Type of Home: House Home Access: Stairs to enter [employer] of Steps: 5-6 Entrance Stairs-Rails: Left Home Layout: Two level;Bed/bath  upstairs;Able to live on main level with bedroom/bathroom Alternate Level Stairs-Number of Steps: 18 Alternate Level Stairs-Rails: Left Bathroom Shower/Tub: Producer, television/film/video: Standard     Home Equipment: None          Prior Functioning/Environment Prior Level of Function : Independent/Modified Independent;Driving;Working/employed             Mobility Comments: No 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        OT Problem List: Pain;Decreased activity tolerance;Decreased knowledge of precautions;Obesity      OT Treatment/Interventions: Self-care/ADL training;Energy conservation;DME and/or AE instruction;Therapeutic activities;Patient/family education    OT Goals(Current goals can be found in the care plan section) Acute Rehab OT Goals Patient Stated Goal: home OT Goal Formulation: With patient Time For Goal Achievement: 03/21/23 Potential to Achieve Goals: Good  OT Frequency: Min 2X/week    Co-evaluation              AM-PAC OT "6 Clicks" Daily Activity     Outcome Measure Help from another person eating meals?: None Help from another person taking care of personal grooming?: A Little Help from another person toileting, which includes using toliet, bedpan, or urinal?: A Little Help from another person bathing (including washing, rinsing, drying)?: A Little Help from another person to put on and taking off regular upper body clothing?: A Little Help from another person to put on and taking off regular lower body clothing?: A Little 6 Click Score: 19   End of Session Nurse Communication: Mobility status  Activity Tolerance: Patient tolerated treatment well Patient left: in chair;with call bell/phone within reach;with family/visitor present  OT Visit Diagnosis: Pain;Other (comment) (decreased activity tolerace) Pain - part of body:  (headache)                Time: 1057-1110 OT Time Calculation (min): 13 min Charges:  OT General Charges $OT  Visit: 1 Visit OT Evaluation $OT Eval Moderate Complexity: 1 Mod  Barry Brunner, OT Acute Rehabilitation Services Office [phone]   Chancy Milroy 03/07/2023, 12:48 PM

## 2023-03-07 NOTE — Evaluation (Signed)
Physical Therapy Evaluation Patient Details Name: Julia Kline MRN: [id number] DOB: March 27, 1986 Today's Date: 03/07/2023  History of Present Illness  Pt is a 37 y/o female presenting on 7/17 due to non-functioning pituitary adenoma.  S/p resection. PMH includes: UTI, DM, eczema, hernia cerebri, HTN, L shoulder impingement, mastitis, PCOS, PNA.   Clinical Impression  Pt presents with condition above and deficits mentioned below, see PT Problem List. PTA, she was independent without DME, working, driving, and living with her husband and x2 young children in a 2-level house with 5-6 STE. Currently, pt is demonstrating WFL, intact, and symmetrical bil lower extremity strength, sensation, and coordination. At this time, she is able to perform all functional mobility at a min guard-supervision level. She is limited by pain, likely resulting in her slow and guarded gait pattern and noted mild balance deficits. Pt will likely progress well as her pain improves. Will continue to follow acutely, but do not anticipate a need for follow-up PT upon d/c home.         Assistance Recommended at Discharge PRN  If plan is discharge home, recommend the following:  Can travel by private vehicle  Assistance with cooking/housework;Assist for transportation;Help with stairs or ramp for entrance        Equipment Recommendations None recommended by PT  Recommendations for Other Services       Functional Status Assessment Patient has had a recent decline in their functional status and demonstrates the ability to make significant improvements in function in a reasonable and predictable amount of time.     Precautions / Restrictions Precautions Precautions: Fall Precaution Comments: A-line Restrictions Weight Bearing Restrictions: No      Mobility  Bed Mobility Overal bed mobility: Needs Assistance Bed Mobility: Rolling, Sidelying to Sit Rolling: Supervision Sidelying to sit: Supervision, HOB  elevated       General bed mobility comments: Supervision for line management and safety, HOB elevated    Transfers Overall transfer level: Needs assistance Equipment used: None Transfers: Sit to/from Stand Sit to Stand: Supervision           General transfer comment: Supervision for safety standing from EOB, no LOB, extra time to power up to stand    Ambulation/Gait Ambulation/Gait assistance: Min guard, Supervision Gait Distance (Feet): 190 Feet Assistive device: None Gait Pattern/deviations: Step-through pattern, Decreased stride length Gait velocity: reduced Gait velocity interpretation: 1.31 - 2.62 ft/sec, indicative of limited community ambulator   General Gait Details: Pt ambulates with a slow cadence and step-through guarded gait pattern, no LOB, min guard-supervision for safety  Stairs Stairs: Yes Stairs assistance: Min guard Stair Management: One rail Right, No rails, Step to pattern, Forwards Number of Stairs: 2 General stair comments: Ascends with R rail and descends with R HHA (A-line in L wrist), slow, step-to gait pattern, no LOB, min guard for safety  Wheelchair Mobility     Tilt Bed    Modified Rankin (Stroke Patients Only)       Balance Overall balance assessment: Mild deficits observed, not formally tested                                           Pertinent Vitals/Pain Pain Assessment Pain Assessment: Faces Faces Pain Scale: Hurts whole lot Pain Location: headache Pain Descriptors / Indicators: Headache Pain Intervention(s): Monitored during session, Limited activity within patient's tolerance, Premedicated before session, Repositioned  Home Living Family/patient expects to be discharged to:: Private residence Living Arrangements: Spouse/significant other;Children (5 and 7 yo children) Available Help at Discharge: Family;Available 24 hours/day Type of Home: House Home Access: Stairs to enter Entrance  Stairs-Rails: Left Entrance Stairs-Number of Steps: 5-6 Alternate Level Stairs-Number of Steps: 18 Home Layout: Two level;Bed/bath upstairs;Able to live on main level with bedroom/bathroom Home Equipment: None      Prior Function Prior Level of Function : Independent/Modified Independent;Driving;Working/employed             Mobility Comments: No 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     Hand Dominance   Dominant Hand: Right    Extremity/Trunk Assessment   Upper Extremity Assessment Upper Extremity Assessment: Defer to OT evaluation    Lower Extremity Assessment Lower Extremity Assessment: Overall WFL for tasks assessed (MMT scores of 4+ to 5 grossly bil, sensation and coordination intact bil)       Communication   Communication: No difficulties  Cognition Arousal/Alertness: Awake/alert Behavior During Therapy: WFL for tasks assessed/performed Overall Cognitive Status: Within Functional Limits for tasks assessed                                          General Comments General comments (skin integrity, edema, etc.): VSS on RA; spouse present and supportive    Exercises     Assessment/Plan    PT Assessment Patient needs continued PT services  PT Problem List Decreased activity tolerance;Decreased balance;Decreased mobility;Pain       PT Treatment Interventions DME instruction;Gait training;Stair training;Functional mobility training;Therapeutic activities;Therapeutic exercise;Balance training;Neuromuscular re-education;Patient/family education    PT Goals (Current goals can be found in the Care Plan section)  Acute Rehab PT Goals Patient Stated Goal: to improve PT Goal Formulation: With patient/family Time For Goal Achievement: 03/14/23 Potential to Achieve Goals: Good    Frequency Min 3X/week     Co-evaluation               AM-PAC PT "6 Clicks" Mobility  Outcome Measure Help needed turning from your back to your side while  in a flat bed without using bedrails?: A Little Help needed moving from lying on your back to sitting on the side of a flat bed without using bedrails?: A Little Help needed moving to and from a bed to a chair (including a wheelchair)?: A Little Help needed standing up from a chair using your arms (e.g., wheelchair or bedside chair)?: A Little Help needed to walk in hospital room?: A Little Help needed climbing 3-5 steps with a railing? : A Little 6 Click Score: 18    End of Session Equipment Utilized During Treatment: Gait belt Activity Tolerance: Patient tolerated treatment well Patient left: in chair;with family/visitor present;Other (comment) (with OT) Nurse Communication: Mobility status PT Visit Diagnosis: Unsteadiness on feet (R26.81);Other abnormalities of gait and mobility (R26.89);Pain    Time: 1035-1059 PT Time Calculation (min) (ACUTE ONLY): 24 min   Charges:   PT Evaluation $PT Eval Low Complexity: 1 Low PT Treatments $Therapeutic Activity: 8-22 mins PT General Charges $$ ACUTE PT VISIT: 1 Visit         Raymond Gurney, PT, DPT Acute Rehabilitation Services  Office: [phone]   Jewel Baize 03/07/2023, 1:28 PM

## 2023-03-07 NOTE — Progress Notes (Addendum)
Neurosurgery Service Progress Note  Subjective: No acute events overnight, vision stable to preop, had some polyuria immediately post-op that has mostly resolved, off clevidipine since last night    Objective: Vitals:   03/07/23 0700 03/07/23 0800 03/07/23 0900 03/07/23 1000  BP: 125/77 (!) 111/97 123/81 128/89  Pulse: (!) 107 (!) 107 (!) 104 (!) 104  Resp: 13 (!) 22 13 12   Temp:  98 F (36.7 C)    TempSrc:  Axillary    SpO2: 93% 94% 94% 94%  Weight:      Height:        Physical Exam: VFF, gaze conjugate, PERRL, getting up out of bed to work with PT, nasal sling in place  Assessment & Plan: 37 y.o. woman s/p endoscopic endonasal for non-functioning pituitary adenoma, recovering well.  -final path pending -transfer to stepdown -d/c foley and A-line -PT/OT -RFP pending -[facility] POD2  Jadene Pierini  03/07/23 10:53 AM

## 2023-03-08 LAB — SURGICAL PATHOLOGY

## 2023-03-08 NOTE — Progress Notes (Signed)
Occupational Therapy Treatment Patient Details Name: Julia Kline MRN: [id number] DOB: [birth year] Today's Date: 03/08/2023   History of present illness Pt is a 37 y/o female presenting on 7/17 due to non-functioning pituitary adenoma.  S/p resection. PMH includes: UTI, DM, eczema, hernia cerebri, HTN, L shoulder impingement, mastitis, PCOS, PNA.   OT comments  Pt supine in bed and agreeable to OT.  Remains limited by headache, edema noted around eyes and sensitive to light.  Educated pt and spouse on compensatory strategies for light sensitivity, provided sunglasses to decrease stimulation when mobilizing.  She is fatigued, with limited engagement.  Able to complete transfers and functional mobility to bathroom with supervision, grooming with supervision. Will follow.    Recommendations for follow up therapy are one component of a multi-disciplinary discharge planning process, led by the attending physician.  Recommendations may be updated based on patient status, additional functional criteria and insurance authorization.    Assistance Recommended at Discharge PRN  Patient can return home with the following  A little help with bathing/dressing/bathroom;A little help with walking and/or transfers;Assist for transportation;Assistance with cooking/housework   Equipment Recommendations  None recommended by OT    Recommendations for Other Services      Precautions / Restrictions Precautions Precautions: Fall Restrictions Weight Bearing Restrictions: No       Mobility Bed Mobility Overal bed mobility: Modified Independent             General bed mobility comments: increased time but no assist required    Transfers Overall transfer level: Needs assistance Equipment used: None Transfers: Sit to/from Stand Sit to Stand: Supervision                 Balance Overall balance assessment: Mild deficits observed, not formally tested                                          ADL either performed or assessed with clinical judgement   ADL Overall ADL's : Needs assistance/impaired     Grooming: Supervision/safety;Wash/dry hands;Standing                   Statistician: Supervision/safety;Ambulation   Toileting- Clothing Manipulation and Hygiene: Supervision/safety;Sit to/from stand       Functional mobility during ADLs: Supervision/safety General ADL Comments: for safety    Extremity/Trunk Assessment              Vision   Additional Comments: pt reports vision is normal but she is hypersensitive to light.  Discussed compesnatory strategies, and provided sunglasses for mobilization.  Encouarged use of natural light at home, wearing sun glasses as needed.   Perception     Praxis      Cognition Arousal/Alertness: Awake/alert Behavior During Therapy: Flat affect Overall Cognitive Status: Within Functional Limits for tasks assessed                                 General Comments: pt lethargic and internally distracted by pain. limited assessment        Exercises      Shoulder Instructions       General Comments VSS on RA, spouse very supportive. Discussed importance of low stimulation once dc home, energy conservation.    Pertinent Vitals/ Pain       Pain Assessment Pain Assessment: Faces  Faces Pain Scale: Hurts whole lot Pain Location: headache Pain Descriptors / Indicators: Headache Pain Intervention(s): Limited activity within patient's tolerance, Monitored during session, Repositioned  Home Living                                          Prior Functioning/Environment              Frequency  Min 2X/week        Progress Toward Goals  OT Goals(current goals can now be found in the care plan section)  Progress towards OT goals: Progressing toward goals  Acute Rehab OT Goals Patient Stated Goal: feel better OT Goal Formulation: With patient Time For  Goal Achievement: 03/21/23 Potential to Achieve Goals: Good  Plan Discharge plan remains appropriate;Frequency needs to be updated    Co-evaluation                 AM-PAC OT "6 Clicks" Daily Activity     Outcome Measure   Help from another person eating meals?: None Help from another person taking care of personal grooming?: A Little Help from another person toileting, which includes using toliet, bedpan, or urinal?: A Little Help from another person bathing (including washing, rinsing, drying)?: A Little Help from another person to put on and taking off regular upper body clothing?: A Little Help from another person to put on and taking off regular lower body clothing?: A Little 6 Click Score: 19    End of Session    OT Visit Diagnosis: Pain;Other (comment) (decrased activity tolerance) Pain - part of body:  (HA)   Activity Tolerance Patient limited by pain   Patient Left in bed;with call bell/phone within reach;with family/visitor present   Nurse Communication Mobility status        Time: 1610-9604 OT Time Calculation (min): 28 min  Charges: OT General Charges $OT Visit: 1 Visit OT Treatments $Self Care/Home Management : 23-37 mins  Barry Brunner, OT Acute Rehabilitation Services Office [phone]   Julia Kline 03/08/2023, 11:17 AM

## 2023-03-08 NOTE — Progress Notes (Signed)
Neurosurgery Service Progress Note  Subjective: No acute events overnight. Reports pain in the nose / face / behind the eyes, continues to be sensitive to light, overall fatigued.   Objective: Vitals:   03/07/23 2011 03/07/23 2301 03/08/23 0322 03/08/23 0732  BP: 133/87 (!) 134/90 (!) 127/91 (!) 136/92  Pulse: 89 93 94 99  Resp: 16 18 15 18   Temp: 98.3 F (36.8 C) 98.7 F (37.1 C) 98.8 F (37.1 C) 98.3 F (36.8 C)  TempSrc: Oral Oral Oral Oral  SpO2: 94% 94% 94% 94%  Weight:      Height:        Physical Exam: Aaox3 with sunglasses on in a dark room, nasal sling in place, FC x4   Assessment & Plan: 37 y.o. woman s/p endoscopic endonasal for non-functioning pituitary adenoma.    -final path pending -PT/OT -[facility]  -will keep her in stepdown for now until she is feeling better   Julia Hero, PA-C 03/08/23 9:23 AM

## 2023-03-09 LAB — GLUCOSE, CAPILLARY: Glucose-Capillary: 199 mg/dL — ABNORMAL HIGH (ref 70–99)

## 2023-03-09 MED ORDER — INSULIN ASPART 100 UNIT/ML IJ SOLN
0.0000 [IU] | Freq: Three times a day (TID) | INTRAMUSCULAR | Status: DC
  Administered 2023-03-10: 3 [IU] via SUBCUTANEOUS
  Administered 2023-03-10: 2 [IU] via SUBCUTANEOUS
  Administered 2023-03-10: 5 [IU] via SUBCUTANEOUS
  Administered 2023-03-11 (×2): 3 [IU] via SUBCUTANEOUS
  Administered 2023-03-11: 5 [IU] via SUBCUTANEOUS
  Administered 2023-03-12: 8 [IU] via SUBCUTANEOUS
  Administered 2023-03-12: 2 [IU] via SUBCUTANEOUS
  Administered 2023-03-12: 3 [IU] via SUBCUTANEOUS
  Administered 2023-03-13: 11 [IU] via SUBCUTANEOUS
  Administered 2023-03-13: 5 [IU] via SUBCUTANEOUS
  Administered 2023-03-13: 11 [IU] via SUBCUTANEOUS

## 2023-03-09 NOTE — Progress Notes (Signed)
   Providing Compassionate, Quality Care - Together   Subjective: Patient reports headache and photophobia.  Objective: Vital signs in last 24 hours: Temp:  [97.9 F (36.6 C)-99.8 F (37.7 C)] 99.8 F (37.7 C) (07/20 0700) Pulse Rate:  [79-99] 80 (07/20 0700) Resp:  [18] 18 (07/20 0700) BP: (112-136)/(74-99) 136/95 (07/20 0700) SpO2:  [94 %-100 %] 94 % (07/20 0905)  Intake/Output from previous day: 07/19 0701 - 07/20 0700 In: 240 [P.O.:240] Out: -  Intake/Output this shift: No intake/output data recorded.  Alert and oriented x 4 PERRLA CN II-XII grossly intact MAE, Strength and sensation intact   Lab Results: Recent Labs    03/06/23 1518  WBC 12.0*  HGB 10.6*  HCT 35.1*  PLT 244   BMET Recent Labs    03/06/23 1518 03/07/23 1129  NA  --  135  K  --  3.6  CL  --  103  CO2  --  25  GLUCOSE  --  233*  BUN  --  7  CREATININE 0.72 0.70  CALCIUM  --  8.5*    Studies/Results: No results found.  Assessment/Plan: Patient underwent endoscopic endonasal for non-functioning pituitary adenoma. Patient with complaints of headache, photophobia, and fatigue.   LOS: 3 days   -If symptoms persist, will consider follow up imaging. Patient is otherwise neurologically intact. -Continue to encourage patient to mobilize.   Val Eagle, DNP, AGNP-C Nurse Practitioner  [facility] [address], [address], [place], [place] [address] P: [phone]    F: [phone]  03/09/2023, 10:53 AM

## 2023-03-09 NOTE — Progress Notes (Signed)
Physical Therapy Treatment Patient Details Name: Julia Kline MRN: [id number] DOB: 1986-05-26 Today's Date: 03/09/2023   History of Present Illness Pt is a 37 y/o female presenting on 7/17 due to non-functioning pituitary adenoma.  S/p resection. PMH includes: UTI, DM, eczema, hernia cerebri, HTN, L shoulder impingement, mastitis, PCOS, PNA.    PT Comments  Pt received in bed, having pain but also very sleepy from pain meds. Pt moves very slowly but is able to come to EOB without physical assist. Pt ambulated 200' in hallway with dark glasses for light sensitivity. Needed min A due to frequently closing eyes, frequent cueing and stimulation given. Pt able to rise from toilet and maintain standing at sink with use of BUE's. BP after ambulation 142/94, O2 sats 99% on RA. Instructed to ambulate 2 more times today if tolerated. PT will continue to follow. BP 142/ 94 after ambulation O2 sats 99% on RA     Assistance Recommended at Discharge PRN  If plan is discharge home, recommend the following:  Can travel by private vehicle    Assistance with cooking/housework;Assist for transportation;Help with stairs or ramp for entrance      Equipment Recommendations  None recommended by PT    Recommendations for Other Services       Precautions / Restrictions Precautions Precautions: Fall Restrictions Weight Bearing Restrictions: No     Mobility  Bed Mobility Overal bed mobility: Modified Independent Bed Mobility: Supine to Sit, Sit to Supine     Supine to sit: Modified independent (Device/Increase time) Sit to supine: Modified independent (Device/Increase time)   General bed mobility comments: increased time but no assist required    Transfers Overall transfer level: Needs assistance Equipment used: None Transfers: Sit to/from Stand Sit to Stand: Supervision           General transfer comment: Supervision for safety standing from EOB, no LOB, extra time to power up  to stand. supervision from toilet    Ambulation/Gait Ambulation/Gait assistance: Min assist Gait Distance (Feet): 200 Feet Assistive device: None Gait Pattern/deviations: Step-through pattern, Decreased stride length Gait velocity: reduced Gait velocity interpretation: <1.31 ft/sec, indicative of household ambulator   General Gait Details: Pt ambulates with a slow cadence and step-through guarded gait pattern, no LOB, min A needed due to pt frequently closing eyes and stopping. Sunglasses used for photosensitivity.   Stairs             Wheelchair Mobility     Tilt Bed    Modified Rankin (Stroke Patients Only)       Balance Overall balance assessment: Mild deficits observed, not formally tested                                          Cognition Arousal/Alertness: Awake/alert Behavior During Therapy: Flat affect Overall Cognitive Status: Within Functional Limits for tasks assessed                                 General Comments: lethargic, answers questions appropriately but needs frequent cues to remain awake, even with ambulation        Exercises      General Comments General comments (skin integrity, edema, etc.): husband present and instructed to ambulate in hallway with pt 2 more times today, esp when she is more awake. Pt maintain standing  at sink to brush teeth, able to stand without UE support      Pertinent Vitals/Pain Pain Assessment Pain Assessment: Faces Faces Pain Scale: Hurts whole lot Pain Location: headache, front to back of head Pain Descriptors / Indicators: Headache Pain Intervention(s): Limited activity within patient's tolerance, Monitored during session    Home Living                          Prior Function            PT Goals (current goals can now be found in the care plan section) Acute Rehab PT Goals Patient Stated Goal: to improve PT Goal Formulation: With patient/family Time  For Goal Achievement: 03/14/23 Potential to Achieve Goals: Good Progress towards PT goals: Progressing toward goals    Frequency    Min 3X/week      PT Plan Current plan remains appropriate    Kline-evaluation              AM-PAC PT "6 Clicks" Mobility   Outcome Measure  Help needed turning from your back to your side while in a flat bed without using bedrails?: None Help needed moving from lying on your back to sitting on the side of a flat bed without using bedrails?: None Help needed moving to and from a bed to a chair (including a wheelchair)?: A Little Help needed standing up from a chair using your arms (e.g., wheelchair or bedside chair)?: A Little Help needed to walk in hospital room?: A Little Help needed climbing 3-5 steps with a railing? : A Little 6 Click Score: 20    End of Session Equipment Utilized During Treatment: Gait belt Activity Tolerance: Patient tolerated treatment well Patient left: in chair;with family/visitor present;with call bell/phone within reach Nurse Communication: Mobility status PT Visit Diagnosis: Unsteadiness on feet (R26.81);Other abnormalities of gait and mobility (R26.89);Pain     Time: 1020-1050 PT Time Calculation (min) (ACUTE ONLY): 30 min  Charges:    $Gait Training: 8-22 mins $Therapeutic Activity: 8-22 mins PT General Charges $$ ACUTE PT VISIT: 1 Visit                     Julia Kline, PT  Acute Rehab Services Secure chat preferred Office [phone]    Julia Kline 03/09/2023, 11:36 AM

## 2023-03-09 NOTE — Anesthesia Postprocedure Evaluation (Addendum)
Anesthesia Post Note  Patient: Julia Kline  Procedure(s) Performed: endoscopic endonasal resection of pituitary tumor (Head) TRANSPHENOIDAL APPROACH EXPOSURE     Patient location during evaluation: PACU Anesthesia Type: General Level of consciousness: patient cooperative and awake Pain management: pain level controlled Vital Signs Assessment: post-procedure vital signs reviewed and stable Respiratory status: spontaneous breathing, nonlabored ventilation and respiratory function stable Cardiovascular status: blood pressure returned to baseline and stable Postop Assessment: no apparent nausea or vomiting Anesthetic complications: no   No notable events documented.  Last Vitals:  Vitals:   03/09/23 1100 03/09/23 1438  BP: (!) 142/94 (!) 154/90  Pulse:    Resp: 20 18  Temp: 36.7 C 36.8 C  SpO2: 98% 94%    Last Pain:  Vitals:   03/09/23 1438  TempSrc: Oral  PainSc:                  Tevyn Codd

## 2023-03-10 ENCOUNTER — Inpatient Hospital Stay (HOSPITAL_COMMUNITY): Payer: 59

## 2023-03-10 LAB — URINALYSIS, ROUTINE W REFLEX MICROSCOPIC
Bilirubin Urine: NEGATIVE
Glucose, UA: 150 mg/dL — AB
Hgb urine dipstick: NEGATIVE
Ketones, ur: 5 mg/dL — AB
Leukocytes,Ua: NEGATIVE
Nitrite: NEGATIVE
Protein, ur: 100 mg/dL — AB
Specific Gravity, Urine: 1.026 (ref 1.005–1.030)
pH: 5 (ref 5.0–8.0)

## 2023-03-10 LAB — GLUCOSE, CAPILLARY
Glucose-Capillary: 246 mg/dL — ABNORMAL HIGH (ref 70–99)
Glucose-Capillary: 166 mg/dL — ABNORMAL HIGH (ref 70–99)
Glucose-Capillary: 144 mg/dL — ABNORMAL HIGH (ref 70–99)
Glucose-Capillary: 158 mg/dL — ABNORMAL HIGH (ref 70–99)

## 2023-03-10 NOTE — Progress Notes (Signed)
NEUROSURGERY PROGRESS NOTE  Status post pituitary tumor resection.  Doing ok, low grade temp today. Will get chest xray and UA. Encouraged IS when awake.    Temp:  [98 F (36.7 C)-98.6 F (37 C)] 98.6 F (37 C) (07/21 0300) Pulse Rate:  [82] 82 (07/20 1438) Resp:  [18-20] 20 (07/21 0300) BP: (134-163)/(83-105) 137/88 (07/21 0300) SpO2:  [94 %-99 %] 94 % (07/20 1438)   Sherryl Manges, NP 03/10/2023 8:27 AM

## 2023-03-11 LAB — GLUCOSE, CAPILLARY
Glucose-Capillary: 202 mg/dL — ABNORMAL HIGH (ref 70–99)
Glucose-Capillary: 181 mg/dL — ABNORMAL HIGH (ref 70–99)
Glucose-Capillary: 163 mg/dL — ABNORMAL HIGH (ref 70–99)
Glucose-Capillary: 113 mg/dL — ABNORMAL HIGH (ref 70–99)

## 2023-03-11 MED ORDER — SIMETHICONE 80 MG PO CHEW
80.0000 mg | CHEWABLE_TABLET | Freq: Four times a day (QID) | ORAL | Status: DC | PRN
  Administered 2023-03-11: 80 mg via ORAL
  Filled 2023-03-11 (×3): qty 1

## 2023-03-11 NOTE — TOC CM/SW Note (Signed)
Transition of Care [facility]) - Inpatient Brief Assessment   Patient Details  Name: Julia Kline MRN: [id number] Date of Birth: 10-21-1985  [facility] of [facility]) CM/SW Contact:    Harriet Masson, RN Phone Number: 03/11/2023, 12:35 PM   Clinical Narrative: Status post pituitary tumor resection.  TOC following.   Transition of Care Asessment: Insurance and Status: Insurance coverage has been reviewed Patient has primary care physician: Yes Home environment has been reviewed: safe to discharge home Prior level of function:: fine at home Prior/Current Home Services: No current home services Social Determinants of Health Reivew: SDOH reviewed no interventions necessary Readmission risk has been reviewed: Yes Transition of care needs: no transition of care needs at this time

## 2023-03-11 NOTE — Progress Notes (Signed)
Patient ID: Julia Kline, female   DOB: October 10, 1985, 37 y.o.   MRN: [id number] Vital signs appear stable patient still having considerable headache and notes   sensitivity to light.  Patient complains of feeling feverish.  Otherwise she seems to be a bit listless.  Continue supportive care.

## 2023-03-11 NOTE — Progress Notes (Addendum)
Occupational Therapy Treatment Patient Details Name: Julia Kline MRN: [id number] DOB: 1985/12/03 Today's Date: 03/11/2023   History of present illness Pt is a 37 y/o female presenting on 7/17 due to non-functioning pituitary adenoma.  S/p resection. PMH includes: UTI, DM, eczema, hernia cerebri, HTN, L shoulder impingement, mastitis, PCOS, PNA.   OT comments  Patient supine in bed, agreeable to OT session. She is tolerating more natural light today, but continues to wear sunglasses in hallway due to photosensitivity.  Completing transfers and Adls with supervision, mobility with hand held assist from her spouse for preference. Continues to have very limited verbalizations, but enjoyed speaking about her kids. Will follow acutely.    Recommendations for follow up therapy are one component of a multi-disciplinary discharge planning process, led by the attending physician.  Recommendations may be updated based on patient status, additional functional criteria and insurance authorization.    Assistance Recommended at Discharge PRN  Patient can return home with the following  A little help with bathing/dressing/bathroom;A little help with walking and/or transfers;Assist for transportation;Assistance with cooking/housework   Equipment Recommendations  None recommended by OT    Recommendations for Other Services      Precautions / Restrictions Precautions Precautions: Fall Restrictions Weight Bearing Restrictions: No       Mobility Bed Mobility Overal bed mobility: Modified Independent                  Transfers Overall transfer level: Needs assistance Equipment used: None Transfers: Sit to/from Stand Sit to Stand: Supervision           General transfer comment: Supervision for safety standing from EOB, no LOB, extra time to power up to stand. supervision from toilet     Balance Overall balance assessment: Mild deficits observed, not formally tested                                          ADL either performed or assessed with clinical judgement   ADL Overall ADL's : Needs assistance/impaired     Grooming: Supervision/safety;Wash/dry hands;Standing           Upper Body Dressing : Sitting;Set up   Lower Body Dressing: Supervision/safety;Sit to/from stand   Toilet Transfer: Supervision/safety;Ambulation           Functional mobility during ADLs: Supervision/safety General ADL Comments: for safety    Extremity/Trunk Assessment              Vision   Additional Comments: pt tolerating natural light today, blinds open when therapist entred room. Continues to wear sunglasses in hallway but able to keep eyes open without cueing.   Perception     Praxis      Cognition Arousal/Alertness: Awake/alert Behavior During Therapy: Flat affect Overall Cognitive Status: Within Functional Limits for tasks assessed                                          Exercises      Shoulder Instructions       General Comments spouse provided hand held support for mobilization    Pertinent Vitals/ Pain       Pain Assessment Pain Assessment: 0-10 Pain Score: 5  Pain Location: headache, front to back of head Pain Descriptors / Indicators: Headache Pain Intervention(s):  Limited activity within patient's tolerance, Monitored during session, Repositioned, Premedicated before session  Home Living                                          Prior Functioning/Environment              Frequency  Min 2X/week        Progress Toward Goals  OT Goals(current goals can now be found in the care plan section)  Progress towards OT goals: Progressing toward goals  Acute Rehab OT Goals Patient Stated Goal: feel better OT Goal Formulation: With patient Time For Goal Achievement: 03/21/23 Potential to Achieve Goals: Good  Plan Discharge plan remains appropriate;Frequency remains  appropriate    Co-evaluation                 AM-PAC OT "6 Clicks" Daily Activity     Outcome Measure   Help from another person eating meals?: None Help from another person taking care of personal grooming?: A Little Help from another person toileting, which includes using toliet, bedpan, or urinal?: A Little Help from another person bathing (including washing, rinsing, drying)?: A Little Help from another person to put on and taking off regular upper body clothing?: A Little Help from another person to put on and taking off regular lower body clothing?: A Little 6 Click Score: 19    End of Session    OT Visit Diagnosis: Pain;Other (comment) (decreased activity tolerance) Pain - part of body:  (headache)   Activity Tolerance Patient tolerated treatment well   Patient Left in bed;with call bell/phone within reach;with family/visitor present   Nurse Communication Mobility status        Time: 2536-6440 OT Time Calculation (min): 31 min  Charges: OT General Charges $OT Visit: 1 Visit OT Treatments $Self Care/Home Management : 8-22 mins $Therapeutic Activity: 8-22 mins  Barry Brunner, OT Acute Rehabilitation Services Office [phone]   Julia Kline 03/11/2023, 10:54 AM

## 2023-03-11 NOTE — Progress Notes (Signed)
Physical Therapy Treatment Patient Details Name: Julia Kline MRN: [id number] DOB: 1985/10/29 Today's Date: 03/11/2023   History of Present Illness Pt is a 37 y/o female presenting on 7/17 due to non-functioning pituitary adenoma.  S/p resection. PMH includes: UTI, DM, eczema, hernia cerebri, HTN, L shoulder impingement, mastitis, PCOS, PNA.    PT Comments  Pt looking better today and able to keep eyes open and engage in more conversation though countenance remains flat and she notes increased head pain with mobility. Pt ambulated 200' with supervision as well as ascending and descending 10 stairs with R rail (she has upstairs bedroom at home). Pt approaching independence. PT will continue to follow.      Assistance Recommended at Discharge PRN  If plan is discharge home, recommend the following:  Can travel by private vehicle    Assistance with cooking/housework;Assist for transportation;Help with stairs or ramp for entrance      Equipment Recommendations  None recommended by PT    Recommendations for Other Services       Precautions / Restrictions Precautions Precautions: None Restrictions Weight Bearing Restrictions: No     Mobility  Bed Mobility Overal bed mobility: Modified Independent                  Transfers Overall transfer level: Modified independent Equipment used: None Transfers: Sit to/from Stand Sit to Stand: Modified independent (Device/Increase time)           General transfer comment: from bed, recliner, and toilet    Ambulation/Gait Ambulation/Gait assistance: Supervision Gait Distance (Feet): 200 Feet Assistive device: None Gait Pattern/deviations: Step-through pattern, Decreased stride length Gait velocity: decreased Gait velocity interpretation: 1.31 - 2.62 ft/sec, indicative of limited community ambulator   General Gait Details: sunglasses not used today and pt tolerated light. Mildly increased cadence though still slow  and pt mentions that mobility increases pain   Stairs Stairs: Yes Stairs assistance: Supervision Stair Management: Forwards, Alternating pattern, One rail Right Number of Stairs: 10 General stair comments: Ascends with R rail, alternating pattern, slow but steady   Wheelchair Mobility     Tilt Bed    Modified Rankin (Stroke Patients Only)       Balance Overall balance assessment: Mild deficits observed, not formally tested                                          Cognition Arousal/Alertness: Awake/alert Behavior During Therapy: Flat affect Overall Cognitive Status: Within Functional Limits for tasks assessed                                 General Comments: pt very flat but appropriate and remained awake        Exercises      General Comments General comments (skin integrity, edema, etc.): pt requested some coloring and actvity material that OT had mentioned this AM, took it back to her after session. Also discussed healthy diet and activity level at d/c to promote healing. Pt voiced understanding      Pertinent Vitals/Pain Pain Assessment Pain Assessment: 0-10 Pain Score: 5  Pain Location: headache Pain Descriptors / Indicators: Headache Pain Intervention(s): Limited activity within patient's tolerance, Monitored during session    Home Living  Prior Function            PT Goals (current goals can now be found in the care plan section) Acute Rehab PT Goals Patient Stated Goal: to improve PT Goal Formulation: With patient/family Time For Goal Achievement: 03/14/23 Potential to Achieve Goals: Good Progress towards PT goals: Progressing toward goals    Frequency    Min 3X/week      PT Plan Current plan remains appropriate    Co-evaluation              AM-PAC PT "6 Clicks" Mobility   Outcome Measure  Help needed turning from your back to your side while in a flat bed  without using bedrails?: None Help needed moving from lying on your back to sitting on the side of a flat bed without using bedrails?: None Help needed moving to and from a bed to a chair (including a wheelchair)?: A Little Help needed standing up from a chair using your arms (e.g., wheelchair or bedside chair)?: A Little Help needed to walk in hospital room?: A Little Help needed climbing 3-5 steps with a railing? : A Little 6 Click Score: 20    End of Session Equipment Utilized During Treatment: Gait belt Activity Tolerance: Patient tolerated treatment well Patient left: in chair;with call bell/phone within reach Nurse Communication: Mobility status PT Visit Diagnosis: Unsteadiness on feet (R26.81);Other abnormalities of gait and mobility (R26.89);Pain     Time: 1610-9604 PT Time Calculation (min) (ACUTE ONLY): 21 min  Charges:    $Gait Training: 8-22 mins PT General Charges $$ ACUTE PT VISIT: 1 Visit                     Lyanne Co, PT  Acute Rehab Services Secure chat preferred Office [phone]    Lawana Chambers Thelma Lorenzetti 03/11/2023, 2:48 PM

## 2023-03-12 LAB — BASIC METABOLIC PANEL
Anion gap: 10 (ref 5–15)
BUN: 9 mg/dL (ref 6–20)
CO2: 27 mmol/L (ref 22–32)
Calcium: 8.4 mg/dL — ABNORMAL LOW (ref 8.9–10.3)
Chloride: 91 mmol/L — ABNORMAL LOW (ref 98–111)
Creatinine, Ser: 0.76 mg/dL (ref 0.44–1.00)
GFR, Estimated: >60 mL/min (ref >60)
Glucose, Bld: 238 mg/dL — ABNORMAL HIGH (ref 70–99)
Potassium: 3.9 mmol/L (ref 3.5–5.1)
Sodium: 128 mmol/L — ABNORMAL LOW (ref 135–145)

## 2023-03-12 LAB — GLUCOSE, CAPILLARY
Glucose-Capillary: 180 mg/dL — ABNORMAL HIGH (ref 70–99)
Glucose-Capillary: 139 mg/dL — ABNORMAL HIGH (ref 70–99)
Glucose-Capillary: 278 mg/dL — ABNORMAL HIGH (ref 70–99)
Glucose-Capillary: 364 mg/dL — ABNORMAL HIGH (ref 70–99)

## 2023-03-12 MED ORDER — INSULIN ASPART 100 UNIT/ML IJ SOLN
0.0000 [IU] | Freq: Every day | INTRAMUSCULAR | Status: DC
  Administered 2023-03-12: 5 [IU] via SUBCUTANEOUS

## 2023-03-12 MED ORDER — HYDROCORTISONE SOD SUC (PF) 100 MG IJ SOLR
100.0000 mg | Freq: Three times a day (TID) | INTRAMUSCULAR | Status: DC
  Administered 2023-03-12 – 2023-03-13 (×4): 100 mg via INTRAVENOUS
  Filled 2023-03-12 (×6): qty 2

## 2023-03-12 MED ORDER — INSULIN ASPART 100 UNIT/ML IJ SOLN: 0.0000 [IU] | Freq: Every day | INTRAMUSCULAR | Status: DC

## 2023-03-12 NOTE — Progress Notes (Signed)
Physical Therapy Treatment Patient Details Name: Julia Kline MRN: [id number] DOB: 1986-04-07 Today's Date: 03/12/2023   History of Present Illness Pt is a 37 y/o female presenting on 7/17 due to non-functioning pituitary adenoma.  S/p resection. PMH includes: UTI, DM, eczema, hernia cerebri, HTN, L shoulder impingement, mastitis, PCOS, PNA.    PT Comments  Pt much more engaged in conversation today, approaching normal affect. Ambulating around unit independently without balance deficits. Pt relays she is having some hip tightness from being in the bed, educated on standing stretches and these helped. No further acute PT needs at this time, instructed pt to continue mobilizing independently.     Assistance Recommended at Discharge PRN  If plan is discharge home, recommend the following:  Can travel by private vehicle    Assistance with cooking/housework;Assist for transportation      Equipment Recommendations  None recommended by PT    Recommendations for Other Services       Precautions / Restrictions Precautions Precautions: None Restrictions Weight Bearing Restrictions: No     Mobility  Bed Mobility Overal bed mobility: Modified Independent             General bed mobility comments: mod I    Transfers Overall transfer level: Modified independent Equipment used: None Transfers: Sit to/from Stand Sit to Stand: Modified independent (Device/Increase time)                Ambulation/Gait Ambulation/Gait assistance: Modified independent (Device/Increase time) Gait Distance (Feet): 400 Feet Assistive device: None Gait Pattern/deviations: Step-through pattern Gait velocity: decreased Gait velocity interpretation: >2.62 ft/sec, indicative of community ambulatory   General Gait Details: pt able to increase cadence today as well as walk bkwds, maintain slow march without LOB   Stairs             Wheelchair Mobility     Tilt Bed     Modified Rankin (Stroke Patients Only)       Balance Overall balance assessment: Modified Independent                                          Cognition Arousal/Alertness: Awake/alert Behavior During Therapy: WFL for tasks assessed/performed Overall Cognitive Status: Within Functional Limits for tasks assessed                                          Exercises      General Comments General comments (skin integrity, edema, etc.): pt reports tighness B hips from being in bed, showed her standing stretches for IR and ER and she was able to do these. Also practiced retrieving object from the floor without looking down to manage head pain and pt able to do this without LOB      Pertinent Vitals/Pain Pain Assessment Pain Assessment: Faces Faces Pain Scale: Hurts even more Pain Location: head Pain Descriptors / Indicators: Headache Pain Intervention(s): Monitored during session    Home Living                          Prior Function            PT Goals (current goals can now be found in the care plan section) Acute Rehab PT Goals Patient Stated Goal: to improve  PT Goal Formulation: All assessment and education complete, DC therapy Time For Goal Achievement: 03/14/23 Potential to Achieve Goals: Good Progress towards PT goals: Goals met/education completed, patient discharged from PT    Frequency    Min 1X/week      PT Plan Current plan remains appropriate    Co-evaluation              AM-PAC PT "6 Clicks" Mobility   Outcome Measure  Help needed turning from your back to your side while in a flat bed without using bedrails?: None Help needed moving from lying on your back to sitting on the side of a flat bed without using bedrails?: None Help needed moving to and from a bed to a chair (including a wheelchair)?: None Help needed standing up from a chair using your arms (e.g., wheelchair or bedside chair)?:  None Help needed to walk in hospital room?: None Help needed climbing 3-5 steps with a railing? : None 6 Click Score: 24    End of Session   Activity Tolerance: Patient tolerated treatment well Patient left: with call bell/phone within reach (in bathroom) Nurse Communication: Mobility status PT Visit Diagnosis: Unsteadiness on feet (R26.81);Other abnormalities of gait and mobility (R26.89);Pain Pain - part of body:  (head)     Time: 0347-4259 PT Time Calculation (min) (ACUTE ONLY): 14 min  Charges:    $Gait Training: 8-22 mins PT General Charges $$ ACUTE PT VISIT: 1 Visit                     Lyanne Co, PT  Acute Rehab Services Secure chat preferred Office [phone]    Lawana Chambers Zemira Zehring 03/12/2023, 1:35 PM

## 2023-03-12 NOTE — Progress Notes (Signed)
Patient ID: Julia Kline, female   DOB: [birth year], 37 y.o.   MRN: [id number] Patient little bit lethargic otherwise no complaints  Awake alert minimal drainage from her nose but a little bit somnolent  Will try steroid bump in case she is somewhat adrenal insufficiency.  Will see if she responds to steroids

## 2023-03-13 LAB — GLUCOSE, CAPILLARY
Glucose-Capillary: 228 mg/dL — ABNORMAL HIGH (ref 70–99)
Glucose-Capillary: 341 mg/dL — ABNORMAL HIGH (ref 70–99)
Glucose-Capillary: 315 mg/dL — ABNORMAL HIGH (ref 70–99)

## 2023-03-13 MED ORDER — HYDROCORTISONE 5 MG PO TABS: 25.0000 mg | ORAL_TABLET | Freq: Every morning | ORAL | 0 refills | Status: DC

## 2023-03-13 MED ORDER — HYDROCODONE-ACETAMINOPHEN 5-325 MG PO TABS: 1.0000 | ORAL_TABLET | ORAL | 0 refills | Status: DC | PRN

## 2023-03-13 MED ORDER — HYDROCORTISONE 10 MG PO TABS: 10.0000 mg | ORAL_TABLET | Freq: Every day | ORAL | 0 refills | Status: DC

## 2023-03-13 MED ORDER — HYDROCORTISONE 10 MG PO TABS
10.0000 mg | ORAL_TABLET | Freq: Every evening | ORAL | Status: DC
  Administered 2023-03-13: 10 mg via ORAL
  Filled 2023-03-13: qty 1

## 2023-03-13 MED ORDER — HYDROCORTISONE 10 MG PO TABS: 10.0000 mg | ORAL_TABLET | Freq: Every day | ORAL | Status: DC

## 2023-03-13 MED ORDER — HYDROCORTISONE 5 MG PO TABS: 25.0000 mg | ORAL_TABLET | Freq: Every morning | ORAL | Status: DC

## 2023-03-13 MED ORDER — HYDROCORTISONE 10 MG PO TABS: 10.0000 mg | ORAL_TABLET | Freq: Every evening | ORAL | 0 refills | Status: DC

## 2023-03-13 NOTE — Progress Notes (Signed)
Occupational Therapy Treatment Patient Details Name: Julia Kline MRN: [id number] DOB: 07-Jun-1986 Today's Date: 03/13/2023   History of present illness Pt is a 37 y/o female presenting on 7/17 due to non-functioning pituitary adenoma.  S/p resection. PMH includes: UTI, DM, eczema, hernia cerebri, HTN, L shoulder impingement, mastitis, PCOS, PNA.   OT comments  Patient progressing well!  She reports feeling "like herself again", engaging in conversation today.  She is completing ADLs and functional mobility at modified independent level.  She no longer has photosensitivity, but still has a headache.  Completed short blessed test and she is demonstrating some decreased short term memory, minor difficulty sequencing/attending but able to complete/self correct during assessment.  Reviewed compensatory techniques for decreased recall, pt voiced understanding. Discussed due to requirements of her job, believe she will benefit from higher level cognitive assessment in the outpatient neuro setting.  Based on performance today, no further acute OT care needs identified and OT will sign off.    Recommendations for follow up therapy are one component of a multi-disciplinary discharge planning process, led by the attending physician.  Recommendations may be updated based on patient status, additional functional criteria and insurance authorization.    Assistance Recommended at Discharge PRN  Patient can return home with the following      Equipment Recommendations  None recommended by OT    Recommendations for Other Services      Precautions / Restrictions Precautions Precautions: None Restrictions Weight Bearing Restrictions: No       Mobility Bed Mobility Overal bed mobility: Modified Independent                  Transfers Overall transfer level: Modified independent                       Balance Overall balance assessment: Modified Independent                                          ADL either performed or assessed with clinical judgement   ADL Overall ADL's : Modified independent                                            Extremity/Trunk Assessment              Vision   Additional Comments: photosensitivity much improved   Perception     Praxis      Cognition Arousal/Alertness: Awake/alert Behavior During Therapy: WFL for tasks assessed/performed Overall Cognitive Status: Impaired/Different from baseline Area of Impairment: Memory                     Memory: Decreased short-term memory         General Comments: pt with decreased short term memory on short blessed test, also had some decreased attention and sequencing but able to problem solve through with increased time.  overall scored 4/28 (WNL) but do recommend higher level cognitive testing with outpatient services due to requirements at her job        Exercises      Shoulder Instructions       General Comments      Pertinent Vitals/ Pain       Pain Assessment Pain Assessment: Faces Faces  Pain Scale: Hurts a little bit Pain Location: head Pain Descriptors / Indicators: Headache Pain Intervention(s): Monitored during session  Home Living                                          Prior Functioning/Environment              Frequency           Progress Toward Goals  OT Goals(current goals can now be found in the care plan section)  Progress towards OT goals: Goals met/education completed, patient discharged from OT  Acute Rehab OT Goals Patient Stated Goal: home OT Goal Formulation: With patient Time For Goal Achievement: 03/21/23 Potential to Achieve Goals: Good  Plan Discharge plan needs to be updated;All goals met and education completed, patient discharged from OT services    Co-evaluation                 AM-PAC OT "6 Clicks" Daily Activity     Outcome Measure    Help from another person eating meals?: None Help from another person taking care of personal grooming?: None Help from another person toileting, which includes using toliet, bedpan, or urinal?: None Help from another person bathing (including washing, rinsing, drying)?: None Help from another person to put on and taking off regular upper body clothing?: None Help from another person to put on and taking off regular lower body clothing?: None 6 Click Score: 24    End of Session    OT Visit Diagnosis: Pain;Other (comment) (decreased activity tolerance) Pain - part of body:  (headache)   Activity Tolerance Patient tolerated treatment well   Patient Left in chair;with call bell/phone within reach   Nurse Communication Mobility status        Time: 6045-4098 OT Time Calculation (min): 17 min  Charges: OT General Charges $OT Visit: 1 Visit OT Treatments $Self Care/Home Management : 8-22 mins  Barry Brunner, OT Acute Rehabilitation Services Office [phone]   Chancy Milroy 03/13/2023, 11:25 AM

## 2023-03-13 NOTE — Progress Notes (Signed)
OTO HNS PROGRESS NOTE  Patient is POD #7 s/p transphenoidal resection of pituitary tumor with septoplasty. Patient had remained inpatient due to persistent headaches and light sensitivity, which she states have improved. Patient was scheduled for first postop visit with ENT today for splint removal.   Splints removed at bedside without difficulty. Following removal, septum appears midline with patent nasal cavities bilaterally with residual packing noted superiorly near sphenoid defect/posterior septectomy. Patient noted immediate improvement in breathing following procedure. Postoperative instructions were again reviewed. Patient is scheduled for next post op appointment in 2 weeks.   Thank you for allowing me to participate in the care of this patient. Please do not hesitate to contact me with any questions or concerns.   Laren Boom, DO Otolaryngology [facility] ENT Cell: [phone]

## 2023-03-13 NOTE — Inpatient Diabetes Management (Signed)
Inpatient Diabetes Program Recommendations  AACE/ADA: New Consensus Statement on Inpatient Glycemic Control (2015)  Target Ranges:  Prepandial:   less than 140 mg/dL      Peak postprandial:   less than 180 mg/dL (1-2 hours)      Critically ill patients:  140 - 180 mg/dL   Lab Results  Component Value Date   GLUCAP 228 (H) 03/13/2023   HGBA1C 6.5 (H) 02/26/2023    Review of Glycemic Control  Latest Reference Range & Units 03/12/23 08:06 03/12/23 12:01 03/12/23 15:00 03/12/23 21:06 03/13/23 07:31  Glucose-Capillary 70 - 99 mg/dL 161 (H) 096 (H) 045 (H) 364 (H) 228 (H)   Diabetes history: DM 2 Outpatient Diabetes medications: Glipizide 5 mg Daily, Mounjaro 12.5 mg weekly Current orders for Inpatient glycemic control:  Novolog 0-15 units tid + hs Glipizide 5 mg Daily  Note: Solucortef 100 mg Q8 hours started yesterday leading to hyperglycemia today  Inpatient Diabetes Program Recommendations:    -   Start Novolog 4 units tid meal coverage if pt eats >50% of meals  Thanks,  Christena Deem RN, MSN, BC-ADM Inpatient Diabetes Coordinator Team Pager [phone] (8a-5p)

## 2023-03-13 NOTE — Discharge Summary (Addendum)
Physician Discharge Summary  Patient ID: Alaira Level MRN: [id number] DOB/AGE: 37/10/1985 37 y.o.  Admit date: 03/06/2023 Discharge date: 03/13/2023  Admission Diagnoses:  Pituitary adenoma    Discharge Diagnoses: same   Discharged Condition: good  Hospital Course: The patient was admitted on 03/06/2023 and taken to the operating room where the patient underwent transphenoidal hypophysectomy . The patient tolerated the procedure well and was taken to the recovery room and then to the ICU in stable condition. The hospital course was routine. There were no complications. The wound remained clean dry and intact. Pt had appropriate head soreness. No complaints of new pain or new N/T/W. The patient remained afebrile with stable vital signs, and tolerated a regular diet. The patient continued to increase activities, and pain was well controlled with oral pain medications.   Consults: None  Significant Diagnostic Studies:  Results for orders placed or performed during the hospital encounter of 03/06/23  Glucose, capillary  Result Value Ref Range   Glucose-Capillary 146 (H) 70 - 99 mg/dL  Glucose, capillary  Result Value Ref Range   Glucose-Capillary 178 (H) 70 - 99 mg/dL  CBC  Result Value Ref Range   WBC 12.0 (H) 4.0 - 10.5 K/uL   RBC 4.74 3.87 - 5.11 MIL/uL   Hemoglobin 10.6 (L) 12.0 - 15.0 g/dL   HCT 66.4 (L) 40.3 - 47.4 %   MCV 74.1 (L) 80.0 - 100.0 fL   MCH 22.4 (L) 26.0 - 34.0 pg   MCHC 30.2 30.0 - 36.0 g/dL   RDW 25.9 56.3 - 87.5 %   Platelets 244 150 - 400 K/uL   nRBC 0.0 0.0 - 0.2 %  Creatinine, serum  Result Value Ref Range   Creatinine, Ser 0.72 0.44 - 1.00 mg/dL   GFR, Estimated >64 >33 mL/min  Basic metabolic panel  Result Value Ref Range   Sodium 135 135 - 145 mmol/L   Potassium 3.6 3.5 - 5.1 mmol/L   Chloride 103 98 - 111 mmol/L   CO2 25 22 - 32 mmol/L   Glucose, Bld 233 (H) 70 - 99 mg/dL   BUN 7 6 - 20 mg/dL   Creatinine, Ser 2.95 0.44 - 1.00 mg/dL    Calcium 8.5 (L) 8.9 - 10.3 mg/dL   GFR, Estimated >18 >84 mL/min   Anion gap 7 5 - 15  Glucose, capillary  Result Value Ref Range   Glucose-Capillary 199 (H) 70 - 99 mg/dL  Urinalysis, Routine w reflex microscopic -Urine, Clean Catch  Result Value Ref Range   Color, Urine AMBER (A) YELLOW   APPearance HAZY (A) CLEAR   Specific Gravity, Urine 1.026 1.005 - 1.030   pH 5.0 5.0 - 8.0   Glucose, UA 150 (A) NEGATIVE mg/dL   Hgb urine dipstick NEGATIVE NEGATIVE   Bilirubin Urine NEGATIVE NEGATIVE   Ketones, ur 5 (A) NEGATIVE mg/dL   Protein, ur 166 (A) NEGATIVE mg/dL   Nitrite NEGATIVE NEGATIVE   Leukocytes,Ua NEGATIVE NEGATIVE   RBC / HPF 0-5 0 - 5 RBC/hpf   WBC, UA 0-5 0 - 5 WBC/hpf   Bacteria, UA RARE (A) NONE SEEN   Squamous Epithelial / HPF 0-5 0 - 5 /HPF   Mucus PRESENT   Glucose, capillary  Result Value Ref Range   Glucose-Capillary 246 (H) 70 - 99 mg/dL  Glucose, capillary  Result Value Ref Range   Glucose-Capillary 166 (H) 70 - 99 mg/dL  Glucose, capillary  Result Value Ref Range   Glucose-Capillary 144 (H)  70 - 99 mg/dL  Glucose, capillary  Result Value Ref Range   Glucose-Capillary 158 (H) 70 - 99 mg/dL  Glucose, capillary  Result Value Ref Range   Glucose-Capillary 202 (H) 70 - 99 mg/dL  Glucose, capillary  Result Value Ref Range   Glucose-Capillary 181 (H) 70 - 99 mg/dL  Glucose, capillary  Result Value Ref Range   Glucose-Capillary 163 (H) 70 - 99 mg/dL  Glucose, capillary  Result Value Ref Range   Glucose-Capillary 113 (H) 70 - 99 mg/dL  Glucose, capillary  Result Value Ref Range   Glucose-Capillary 180 (H) 70 - 99 mg/dL  Basic metabolic panel  Result Value Ref Range   Sodium 128 (L) 135 - 145 mmol/L   Potassium 3.9 3.5 - 5.1 mmol/L   Chloride 91 (L) 98 - 111 mmol/L   CO2 27 22 - 32 mmol/L   Glucose, Bld 238 (H) 70 - 99 mg/dL   BUN 9 6 - 20 mg/dL   Creatinine, Ser 2.95 0.44 - 1.00 mg/dL   Calcium 8.4 (L) 8.9 - 10.3 mg/dL   GFR, Estimated >62 >13  mL/min   Anion gap 10 5 - 15  Glucose, capillary  Result Value Ref Range   Glucose-Capillary 139 (H) 70 - 99 mg/dL  Glucose, capillary  Result Value Ref Range   Glucose-Capillary 278 (H) 70 - 99 mg/dL  Glucose, capillary  Result Value Ref Range   Glucose-Capillary 364 (H) 70 - 99 mg/dL   Comment 1 Notify RN    Comment 2 Document in Chart   Glucose, capillary  Result Value Ref Range   Glucose-Capillary 228 (H) 70 - 99 mg/dL  Glucose, capillary  Result Value Ref Range   Glucose-Capillary 341 (H) 70 - 99 mg/dL  Glucose, capillary  Result Value Ref Range   Glucose-Capillary 315 (H) 70 - 99 mg/dL  Pregnancy, urine POC  Result Value Ref Range   Preg Test, Ur NEGATIVE NEGATIVE  Surgical pathology  Result Value Ref Range   SURGICAL PATHOLOGY      SURGICAL PATHOLOGY CASE: [id number] PATIENT: Donda [facility] Surgical Pathology Report     Clinical History: pituitary adenoma (cm)     FINAL MICROSCOPIC DIAGNOSIS:  A. PITUITARY TUMOR, RESECTION: Pituitary adenoma. See comment.  COMMENT: Immunohistochemistry for chromogranin and synaptophysin is positive supporting the diagnosis.   GROSS DESCRIPTION: Received in formalin are multiple fragments of pink-tan soft tissue measuring 0.8 x 0.8 x 0.2 cm in aggregate.  The specimen is entirely submitted in 1 block. (KW, 03/06/2023)    Final Diagnosis performed by Jimmy Picket, MD.   Electronically signed 03/08/2023 Technical and / or Professional components performed at [facility]. [facility], [address], [place], [place] [address].  Immunohistochemistry Technical component (if applicable) was performed at [facility] [address], [place], [place] [address].   IMMUNOHISTOCHEMISTRY DISCLAIMER (if applicable): Some  of these immunohistochemical stains may have been developed and the performance characteristics determine by [facility]. Some may not have been cleared or  approved by the U.S. Food and Drug Administration. The FDA has determined that such clearance or approval is not necessary. This test is used for clinical purposes. It should not be regarded as investigational or for research. This laboratory is certified under the Clinical Laboratory Improvement Amendments of 1988 (CLIA-88) as qualified to perform high complexity clinical laboratory testing.  The controls stained appropriately.   IHC stains are performed on formalin fixed, paraffin embedded tissue using a 3,3"diaminobenzidine (  DAB) chromogen and Leica Bond Autostainer System. The staining intensity of the nucleus is score manually and is reported as the percentage of tumor cell nuclei demonstrating specific nuclear staining. The specimens are fixed in 10% Neutral Formalin for at least 6 hours and up to 7 2hrs. These tests are validated on decalcified tissue. Results should be interpreted with caution given the possibility of false negative results on decalcified specimens. Antibody Clones are as follows ER-clone [id number], PR-clone 16, Ki67- clone MM1. Some of these immunohistochemical stains may have been developed and the performance characteristics determined by [facility].     DG Chest 2 View  Result Date: 03/10/2023 CLINICAL DATA:  Fever. EXAM: CHEST - 2 VIEW COMPARISON:  Radiographs 07/26/2022 and 03/25/2020.  CT 09/28/2019. FINDINGS: Suboptimal inspiration on the lateral view. The heart size and mediastinal contours are normal. The lungs are clear. There is no pleural effusion or pneumothorax. No acute osseous findings are identified. IMPRESSION: No evidence of active cardiopulmonary process. Suboptimal inspiration on the lateral view. Electronically Signed   By: Carey Bullocks M.D.   On: 03/10/2023 10:11    Antibiotics:  Anti-infectives (From admission, onward)    Start     Dose/Rate Route Frequency Ordered Stop   03/06/23 0715  vancomycin (VANCOREADY) IVPB 1500 mg/300 mL         1,500 mg 150 mL/hr over 120 Minutes Intravenous On call to O.R. 03/06/23 0705 03/07/23 1343   03/06/23 0715  vancomycin (VANCOREADY) IVPB 1500 mg/300 mL  Status:  Discontinued        1,500 mg 150 mL/hr over 120 Minutes Intravenous On call to O.R. 03/06/23 0705 03/06/23 0707       Discharge Exam: Blood pressure 118/89, pulse 91, temperature 97.6 F (36.4 C), temperature source Oral, resp. rate 18, height 5\' 10"  (1.778 m), weight 115.7 kg, last menstrual period 02/10/2023, SpO2 99%. Neurologic: Grossly normal Ambulating and voiding well  Discharge Medications:   Allergies as of 03/13/2023       Reactions   Ancef [cefazolin] Hives, Swelling   Facial and neck swelling; Had reaction after receiving fentanyl and ancef in the OR   Fentanyl Hives, Swelling   Facial and neck swelling; Had reaction after receiving fentanyl and ancef in the OR   Shrimp [shellfish Allergy] Swelling, Other (See Comments)   Swelling of throat   Chlorhexidine Hives, Itching, Rash   Morphine And Codeine Rash   Pseudoephedrine Hcl Other (See Comments)   Light headed/ dizzy        Medication List     TAKE these medications    aspirin-acetaminophen-caffeine 250-250-65 MG tablet Commonly known as: EXCEDRIN MIGRAINE Take 2 tablets by mouth every 6 (six) hours as needed for headache.   FreeStyle Libre 3 Sensor Misc Place 1 sensor on the skin every 14 days. Use to check glucose continuously.   glipiZIDE 5 MG 24 hr tablet Commonly known as: GLUCOTROL XL TAKE 1 TABLET(5 MG) BY MOUTH DAILY WITH BREAKFAST FOR DIABETES   HYDROcodone-acetaminophen 5-325 MG tablet Commonly known as: NORCO/VICODIN Take 1 tablet by mouth every 4 (four) hours as needed for moderate pain.   hydrocortisone 10 MG tablet Commonly known as: CORTEF Take 1 tablet (10 mg total) by mouth every evening.   hydrocortisone 5 MG tablet Commonly known as: CORTEF Take 5 tablets (25 mg total) by mouth every morning. Start taking on:  March 14, 2023   hydrocortisone 10 MG tablet Commonly known as: CORTEF Take 1 tablet (10 mg total) by  mouth daily. Start taking on: March 17, 2023   insulin lispro 100 UNIT/ML KwikPen Commonly known as: HumaLOG KwikPen Inject 15 Units into the skin with breakfast, with lunch, and with evening meal.   Lantus SoloStar 100 UNIT/ML Solostar Pen Generic drug: insulin glargine Inject 25 Units into the skin daily.   norethindrone 0.35 MG tablet Commonly known as: MICRONOR Take 1 tablet (0.35 mg total) by mouth daily.   TechLite Pen Needles 32G X 4 MM Misc Generic drug: Insulin Pen Needle USE NIGHTLY WITH INSULIN   tirzepatide 12.5 MG/0.5ML Pen Commonly known as: MOUNJARO Inject 12.5 mg into the skin once a week. for diabetes.   valsartan 160 MG tablet Commonly known as: DIOVAN TAKE 1 TABLET BY MOUTH DAILY FOR BLOOD PRESSURE        Disposition: home   Final Dx: transphenoidal hypophysectomy  Discharge Instructions     Call MD for:  difficulty breathing, headache or visual disturbances   Complete by: As directed    Call MD for:  hives   Complete by: As directed    Call MD for:  persistant nausea and vomiting   Complete by: As directed    Call MD for:  redness, tenderness, or signs of infection (pain, swelling, redness, odor or green/yellow discharge around incision site)   Complete by: As directed    Call MD for:  severe uncontrolled pain   Complete by: As directed    Call MD for:  temperature >100.4   Complete by: As directed    Diet - low sodium heart healthy   Complete by: As directed    Increase activity slowly   Complete by: As directed           Signed: Tiana Loft  03/13/2023, 5:34 PM

## 2023-03-14 ENCOUNTER — Telehealth: Payer: Self-pay

## 2023-03-14 ENCOUNTER — Other Ambulatory Visit (HOSPITAL_COMMUNITY): Payer: Self-pay

## 2023-03-14 NOTE — Transitions of Care (Post Inpatient/ED Visit) (Signed)
03/14/2023  Name: Julia Kline MRN: [id number] DOB: 01-29-1986  Today's TOC FU Call Status: Today's TOC FU Call Status:: Successful TOC FU Call Competed TOC FU Call Complete Date: 03/14/23  Transition Care Management Follow-up Telephone Call Date of Discharge: 03/13/23 Discharge Facility: [facility]) Type of Discharge: Inpatient Admission Primary Inpatient Discharge Diagnosis:: Status post transsphenoidal pituitary resection How have you been since you were released from the hospital?: Better Any questions or concerns?: No  Items Reviewed: Did you receive and understand the discharge instructions provided?: Yes Medications obtained,verified, and reconciled?: Yes (Medications Reviewed) Any new allergies since your discharge?: No Dietary orders reviewed?: Yes Do you have support at home?: Yes  Medications Reviewed Today: Medications Reviewed Today     Reviewed by Merleen Nicely, LPN (Licensed Practical Nurse) on 03/14/23 at 1107  Med List Status: <None>   Medication Order Taking? Sig Documenting Provider Last Dose Status Informant  aspirin-acetaminophen-caffeine (EXCEDRIN MIGRAINE) 250-250-65 MG tablet [id number] No Take 2 tablets by mouth every 6 (six) hours as needed for headache.  Patient not taking: Reported on 03/14/2023   [provider] Not Taking Active Self  Continuous Glucose Sensor (FREESTYLE LIBRE 3 SENSOR) [place] [id number] Yes Place 1 sensor on the skin every 14 days. Use to check glucose continuously. Doreene Nest, NP Taking Active   glipiZIDE (GLUCOTROL XL) 5 MG 24 hr tablet [id number] Yes TAKE 1 TABLET(5 MG) BY MOUTH DAILY WITH BREAKFAST FOR DIABETES Doreene Nest, NP Taking Active Self  HYDROcodone-acetaminophen (NORCO/VICODIN) 5-325 MG tablet [id number] Yes Take 1 tablet by mouth every 4 (four) hours as needed for moderate pain. Meyran, Tiana Loft, NP Taking Active   hydrocortisone (CORTEF) 10 MG tablet [id number] Yes Take 1 tablet  (10 mg total) by mouth every evening. Meyran, Tiana Loft, NP Taking Active   hydrocortisone (CORTEF) 10 MG tablet [id number] No Take 1 tablet (10 mg total) by mouth daily.  Patient not taking: Reported on 03/14/2023   Sherryl Manges, NP Not Taking Active   hydrocortisone (CORTEF) 5 MG tablet [id number] Yes Take 5 tablets (25 mg total) by mouth every morning. Meyran, Tiana Loft, NP Taking Active   insulin glargine (LANTUS SOLOSTAR) 100 UNIT/ML Solostar Pen [id number] Yes Inject 25 Units into the skin daily. Doreene Nest, NP Taking Active Self  insulin lispro (HUMALOG KWIKPEN) 100 UNIT/ML KwikPen [id number] Yes Inject 15 Units into the skin with breakfast, with lunch, and with evening meal. Reva Bores, MD Taking Active Self  Insulin Pen Needle (TECHLITE PEN NEEDLES) 32G X 4 MM MISC [id number] Yes USE NIGHTLY WITH INSULIN Doreene Nest, NP Taking Active Self  norethindrone (MICRONOR) 0.35 MG tablet [id number] No Take 1 tablet (0.35 mg total) by mouth daily.  Patient not taking: Reported on 03/14/2023   [place] Bing, MD Not Taking Active Self  tirzepatide [facility]) 12.5 MG/0.5ML Pen [id number] Yes Inject 12.5 mg into the skin once a week. for diabetes. Doreene Nest, NP Taking Active Self  valsartan (DIOVAN) 160 MG tablet [id number] Yes TAKE 1 TABLET BY MOUTH DAILY FOR BLOOD PRESSURE Doreene Nest, NP Taking Active Self            Home Care and Equipment/Supplies: Were Home Health Services Ordered?: No Any new equipment or medical supplies ordered?: No  Functional Questionnaire: Do you need assistance with bathing/showering or dressing?: No Do you need assistance with meal preparation?: No Do you need assistance with eating?: No Do you have difficulty maintaining continence: No Do  you need assistance with getting out of bed/getting out of a chair/moving?: No Do you have difficulty managing or taking your medications?: No  Follow up appointments  reviewed: PCP Follow-up appointment confirmed?: Yes Date of PCP follow-up appointment?: 03/20/23 Follow-up Provider: Vernona Rieger NP [facility] Follow-up appointment confirmed?: No Do you need transportation to your follow-up appointment?: No Do you understand care options if your condition(s) worsen?: Yes-patient verbalized understanding    SIGNATURE  Woodfin Ganja LPN [facility] Nurse Health Advisor Direct Dial [phone]

## 2023-03-15 ENCOUNTER — Ambulatory Visit: Payer: 59 | Admitting: Primary Care

## 2023-03-20 ENCOUNTER — Encounter: Payer: Self-pay | Admitting: Primary Care

## 2023-03-20 ENCOUNTER — Ambulatory Visit (INDEPENDENT_AMBULATORY_CARE_PROVIDER_SITE_OTHER): Payer: 59 | Admitting: Primary Care

## 2023-03-20 VITALS — BP 110/68 | HR 120 | Temp 97.6°F | Ht 70.0 in | Wt 247.0 lb

## 2023-03-20 DIAGNOSIS — E893 Postprocedural hypopituitarism: Secondary | ICD-10-CM

## 2023-03-20 DIAGNOSIS — Z7985 Long-term (current) use of injectable non-insulin antidiabetic drugs: Secondary | ICD-10-CM

## 2023-03-20 DIAGNOSIS — E1165 Type 2 diabetes mellitus with hyperglycemia: Secondary | ICD-10-CM | POA: Diagnosis not present

## 2023-03-20 NOTE — Progress Notes (Signed)
Subjective:    Patient ID: Julia Kline, female    DOB: November 30, 1985, 37 y.o.   MRN: [id number]  HPI  Julia Kline is a very pleasant 37 y.o. female with a history of type 2 diabetes, pituitary adenoma, hypertension, migraines who presents today for hospital follow-up.  Her husband joins [place] today.  Admitted to [facility] on 03/06/2023 for endonasal resection for nonfunctioning pituitary adenoma per Dr. Maurice Small.  She tolerated surgery well, no complications.  She did experience headache, photophobia, fatigue after her surgery.  Given the symptoms her steroid dose was increased.  Splint was removed on 03/13/2023.  She was discharged home on 03/13/2023 with prescriptions for hydrocortisone 10 mg and 5 mg.   Since her hospitalization she's seen her neurosurgeon this morning. She is confused about the dosing of her hydrocortisone, has been taking 5 mg in AM and 10 mg in PM. This was not discussed during her visit this morning.   She is pending a CTA of her chest for chest pressure and exertional dyspnea that began while she was admitted. She underwent chest xray on 07/21 which was negative. She has not heard from scheduling for CTA chest.   Yesterday she had a "terrible headache" to the right frontal and temporal lobes. She's been taking Norco for her headaches. She denies a headache today. Her recent headaches do not feel like typical migraines. She was told by the surgeon that her prior headaches were from the tumor. She will return to her neurosurgeon in 3 weeks for repeat MRI.   She does not feel ready to return to work, may return in late August. She works as a Runner, broadcasting/film/video.   She was resumed on both Lantus 25 units and Humalog 15 units TID with meals in the hospital. She is getting readings frequently in the 50's. She has stopped her Lantus and Humalog. She has continued her Mounjaro.12.5 mg weekly and glipizide XL 5 mg daily.  Review of Systems  Eyes:  Positive for  photophobia.  Respiratory:  Positive for shortness of breath.   Cardiovascular:  Positive for chest pain.  Neurological:  Positive for headaches.         Past Medical History:  Diagnosis Date   Acute conjunctivitis of right eye 07/10/2019   Acute pain of right knee 08/06/2019   Anemia 10/14/2019   Complicated UTI (urinary tract infection) 03/10/2021   COVID 2020   had pneumonia   COVID-19    Diabetes mellitus without complication (HCC)    Eczema    Hernia cerebri (HCC)    Hidradenitis axillaris 04/20/2020   Followed by [facility]. Last seen 05/26/2020: instructed to wash b/l with Hibiclens and take Doxycycline as prescribed and follow up in one month.    History of COVID-19 12/21/2019   History of gestational diabetes 05/02/2017   Normal pp testing 10/2017   History of severe pre-eclampsia 05/02/2017   Hypertension    Impingement syndrome of left shoulder 02/16/2020   knee   Mastitis 06/18/2019   Migraine    PCOS (polycystic ovarian syndrome)    Pneumonia    Pregnancy induced hypertension    Skin lesions 09/12/2020    Social History   Socioeconomic History   Marital status: Married    Spouse name: Not on file   Number of children: Not on file   Years of education: Not on file   Highest education level: Master's degree (e.g., MA, MS, MEng, MEd, MSW, MBA)  Occupational History  Not on file  Tobacco Use   Smoking status: Never   Smokeless tobacco: Never  Vaping Use   Vaping status: Never Used  Substance and Sexual Activity   Alcohol use: No   Drug use: No   Sexual activity: Yes    Birth control/protection: None  Other Topics Concern   Not on file  Social History Narrative   Married.   2 children.   Works at [employer].   Social Determinants of Health   Financial Resource Strain: Low Risk  (12/14/2022)   Overall Financial Resource Strain (CARDIA)    Difficulty of Paying Living Expenses: Not very hard  Food Insecurity: No Food  Insecurity (03/08/2023)   Hunger Vital Sign    Worried About Running Out of Food in the Last Year: Never true    Ran Out of Food in the Last Year: Never true  Transportation Needs: No Transportation Needs (03/08/2023)   PRAPARE - Administrator, Civil Service (Medical): No    Lack of Transportation (Non-Medical): No  Physical Activity: Sufficiently Active (12/14/2022)   Exercise Vital Sign    Days of Exercise per Week: 6 days    Minutes of Exercise per Session: 150+ min  Stress: Not on file  Social Connections: Moderately Integrated (12/14/2022)   Social Connection and Isolation Panel [NHANES]    Frequency of Communication with Friends and Family: More than three times a week    Frequency of Social Gatherings with Friends and Family: Once a week    Attends Religious Services: More than 4 times per year    Active Member of [employer] or Organizations: No    Attends Banker Meetings: Not on file    Marital Status: Married  Intimate Partner Violence: Not At Risk (03/08/2023)   Humiliation, Afraid, Rape, and Kick questionnaire    Fear of Current or Ex-Partner: No    Emotionally Abused: No    Physically Abused: No    Sexually Abused: No    Past Surgical History:  Procedure Laterality Date   CESAREAN SECTION  2017   Procedure: CESAREAN DELIVERY ONLY; Surgeon: Marian Sorrow, MD; Location: C-SECTION SUITE [facility]; Service: Obstetrics   CESAREAN SECTION N/A 10/02/2017   Procedure: CESAREAN SECTION;  Surgeon: Tilda Burrow, MD;  Location: [facility] BIRTHING SUITES;  Service: Obstetrics;  Laterality: N/A;   CRANIOTOMY N/A 03/06/2023   Procedure: endoscopic endonasal resection of pituitary tumor;  Surgeon: Jadene Pierini, MD;  Location: [facility] OR;  Service: Neurosurgery;  Laterality: N/A;   DILATION AND EVACUATION N/A 03/06/2022   Procedure: DILATATION AND EVACUATION;  Surgeon: Hermina Staggers, MD;  Location: [facility] OR;  Service: Gynecology;  Laterality: N/A;   I & D EXTREMITY  Left 10/14/2020   Procedure: Irrigation and debridement left small and ring finger with repair of tendon;  Surgeon: Dominica Severin, MD;  Location: [facility] OR;  Service: Orthopedics;  Laterality: Left;  1 hr Block with IV Sed   LAPAROSCOPIC OOPHERECTOMY Left    LEEP     OVARIAN CYST REMOVAL     TONSILLECTOMY     As a child   TRANSPHENOIDAL APPROACH EXPOSURE N/A 03/06/2023   Procedure: TRANSPHENOIDAL APPROACH EXPOSURE;  Surgeon: Laren Boom, DO;  Location: [facility] OR;  Service: ENT;  Laterality: N/A;   WISDOM TOOTH EXTRACTION      Family History  Problem Relation Age of Onset   Diabetes Mother    Migraines Mother    Diabetes Father  Hypertension Father    Hypertension Maternal Grandmother    Diabetes Maternal Grandmother    Diabetes Maternal Grandfather    COPD Maternal Grandfather    Diabetes Paternal Grandmother    Hypertension Paternal Grandmother    Diabetes Paternal Grandfather    Hypertension Paternal Grandfather    Heart attack Paternal Grandfather    Asthma Sister     Allergies  Allergen Reactions   Ancef [Cefazolin] Hives and Swelling    Facial and neck swelling; Had reaction after receiving fentanyl and ancef in the OR   Fentanyl Hives and Swelling    Facial and neck swelling; Had reaction after receiving fentanyl and ancef in the OR   Shrimp [Shellfish Allergy] Swelling and Other (See Comments)    Swelling of throat   Chlorhexidine Hives, Itching and Rash   Morphine And Codeine Rash   Pseudoephedrine Hcl Other (See Comments)    Light headed/ dizzy    Current Outpatient Medications on File Prior to Visit  Medication Sig Dispense Refill   aspirin-acetaminophen-caffeine (EXCEDRIN MIGRAINE) 250-250-65 MG tablet Take 2 tablets by mouth every 6 (six) hours as needed for headache.     Continuous Glucose Sensor (FREESTYLE LIBRE 3 SENSOR) MISC Place 1 sensor on the skin every 14 days. Use to check glucose continuously. 6 each 3   glipiZIDE (GLUCOTROL XL) 5 MG 24 hr  tablet TAKE 1 TABLET(5 MG) BY MOUTH DAILY WITH BREAKFAST FOR DIABETES 90 tablet 1   HYDROcodone-acetaminophen (NORCO/VICODIN) 5-325 MG tablet Take 1 tablet by mouth every 4 (four) hours as needed for moderate pain. 30 tablet 0   hydrocortisone (CORTEF) 10 MG tablet Take 1 tablet (10 mg total) by mouth every evening. 10 tablet 0   hydrocortisone (CORTEF) 10 MG tablet Take 1 tablet (10 mg total) by mouth daily. 3 tablet 0   hydrocortisone (CORTEF) 5 MG tablet Take 5 tablets (25 mg total) by mouth every morning. 3 tablet 0   insulin glargine (LANTUS SOLOSTAR) 100 UNIT/ML Solostar Pen Inject 25 Units into the skin daily. 30 mL 0   insulin lispro (HUMALOG KWIKPEN) 100 UNIT/ML KwikPen Inject 15 Units into the skin with breakfast, with lunch, and with evening meal. 15 mL 11   Insulin Pen Needle (TECHLITE PEN NEEDLES) 32G X 4 MM MISC USE NIGHTLY WITH INSULIN 100 each 1   tirzepatide (MOUNJARO) 12.5 MG/0.5ML Pen Inject 12.5 mg into the skin once a week. for diabetes. 6 mL 0   valsartan (DIOVAN) 160 MG tablet TAKE 1 TABLET BY MOUTH DAILY FOR BLOOD PRESSURE 90 tablet 2   norethindrone (MICRONOR) 0.35 MG tablet Take 1 tablet (0.35 mg total) by mouth daily. (Patient not taking: Reported on 03/20/2023) 84 tablet 6   No current facility-administered medications on file prior to visit.    BP 110/68   Pulse (!) 120   Temp 97.6 F (36.4 C) (Temporal)   Ht 5\' 10"  (1.778 m)   Wt 247 lb (112 kg)   LMP 02/10/2023 (Exact Date) Comment: Urine pregnancy test negative on 03/06/23  SpO2 98%   BMI 35.44 kg/m  Objective:   Physical Exam Eyes:     Extraocular Movements: Extraocular movements intact.  Cardiovascular:     Rate and Rhythm: Normal rate and regular rhythm.  Pulmonary:     Effort: Pulmonary effort is normal.     Breath sounds: Normal breath sounds.  Musculoskeletal:     Cervical back: Neck supple.  Skin:    General: Skin is warm and dry.  Neurological:     Cranial Nerves: No cranial nerve deficit.      Coordination: Coordination normal.           Assessment & Plan:  Type 2 diabetes mellitus with hyperglycemia, without long-term current use of insulin (HCC) Assessment & Plan: Remain off Lantus and Humalog for now. Stop Glipizide XL 5 mg daily.   Continue Mounjaro 12.5 mg daily.   Follow up in 3 months.   Status post transsphenoidal pituitary resection [facility]) Assessment & Plan: Recent hospitalization. Notes, labs, imaging reviewed.  Directions are unclear regarding hydrocortisone dosing within the chart.  Patient is also confused. She will call her neurosurgeons office today for clarification.  Proceed with CTA chest as ordered.  Proceed with MRI in 3 weeks as planned.  Neuro exam today negative.         Doreene Nest, NP

## 2023-03-20 NOTE — Patient Instructions (Signed)
Please call your neurosurgeons office to clarify the hydrocortisone prescription.  Complete the CT of your chest as discussed.  Please schedule a follow up visit for 3 months.  It was a pleasure to see you today!

## 2023-03-20 NOTE — Assessment & Plan Note (Signed)
Recent hospitalization. Notes, labs, imaging reviewed.  Directions are unclear regarding hydrocortisone dosing within the chart.  Patient is also confused. She will call her neurosurgeons office today for clarification.  Proceed with CTA chest as ordered.  Proceed with MRI in 3 weeks as planned.  Neuro exam today negative.

## 2023-03-20 NOTE — Assessment & Plan Note (Signed)
Remain off Lantus and Humalog for now. Stop Glipizide XL 5 mg daily.   Continue Mounjaro 12.5 mg daily.   Follow up in 3 months.

## 2023-04-02 ENCOUNTER — Other Ambulatory Visit: Payer: Self-pay | Admitting: Physician Assistant

## 2023-04-02 DIAGNOSIS — R0602 Shortness of breath: Secondary | ICD-10-CM

## 2023-04-05 ENCOUNTER — Ambulatory Visit
Admission: RE | Admit: 2023-04-05 | Discharge: 2023-04-05 | Disposition: A | Discharge status: Home / Self Care | Payer: 59 | Source: Ambulatory Visit | Attending: Physician Assistant | Admitting: Physician Assistant

## 2023-04-05 DIAGNOSIS — R0602 Shortness of breath: Secondary | ICD-10-CM

## 2023-04-05 MED ORDER — IOPAMIDOL (ISOVUE-370) INJECTION 76%
100.0000 mL | Freq: Once | INTRAVENOUS | Status: AC | PRN
  Administered 2023-04-05: 100 mL via INTRAVENOUS

## 2023-04-17 ENCOUNTER — Other Ambulatory Visit: Payer: Self-pay | Admitting: Primary Care

## 2023-04-17 DIAGNOSIS — E1165 Type 2 diabetes mellitus with hyperglycemia: Secondary | ICD-10-CM

## 2023-05-03 ENCOUNTER — Encounter (HOSPITAL_COMMUNITY): Payer: Self-pay | Admitting: Neurological Surgery

## 2023-05-06 ENCOUNTER — Other Ambulatory Visit (HOSPITAL_COMMUNITY): Payer: Self-pay | Admitting: Neurological Surgery

## 2023-05-06 DIAGNOSIS — D352 Benign neoplasm of pituitary gland: Secondary | ICD-10-CM

## 2023-05-14 ENCOUNTER — Ambulatory Visit (HOSPITAL_COMMUNITY)
Admission: RE | Admit: 2023-05-14 | Discharge: 2023-05-14 | Disposition: A | Discharge status: Home / Self Care | Payer: 59 | Source: Ambulatory Visit | Attending: Neurological Surgery | Admitting: Neurological Surgery

## 2023-05-14 DIAGNOSIS — D352 Benign neoplasm of pituitary gland: Secondary | ICD-10-CM | POA: Insufficient documentation

## 2023-05-14 MED ORDER — GADOBUTROL 1 MMOL/ML IV SOLN
10.0000 mL | Freq: Once | INTRAVENOUS | Status: AC | PRN
  Administered 2023-05-14: 10 mL via INTRAVENOUS

## 2023-05-15 ENCOUNTER — Ambulatory Visit: Payer: 59 | Admitting: Podiatry

## 2023-05-15 ENCOUNTER — Encounter: Payer: Self-pay | Admitting: Podiatry

## 2023-05-15 DIAGNOSIS — L600 Ingrowing nail: Secondary | ICD-10-CM

## 2023-05-15 DIAGNOSIS — R0600 Dyspnea, unspecified: Secondary | ICD-10-CM | POA: Insufficient documentation

## 2023-05-15 MED ORDER — NEOMYCIN-POLYMYXIN-HC 1 % OT SOLN: OTIC | 1 refills | Status: DC

## 2023-05-15 NOTE — Progress Notes (Signed)
Subjective:  Patient ID: [name], female    DOB: 01/24/1986,  MRN: [id number] HPI Chief Complaint  Patient presents with   Toe Pain    Hallux right - medial border, tender x 1 week, tried trimming - no help   Diabetes    Last A1c was 6.5   New Patient (Initial Visit)    37 y.o. female presents with the above complaint.   ROS: Denies fever chills nausea vomit muscle aches pains calf pain back pain chest pain shortness of breath  Past Medical History:  Diagnosis Date   Acute conjunctivitis of right eye 07/10/2019   Acute pain of right knee 08/06/2019   Anemia 10/14/2019   Complicated UTI (urinary tract infection) 03/10/2021   COVID 2020   had pneumonia   COVID-19    Diabetes mellitus without complication (HCC)    Eczema    Hernia cerebri (HCC)    Hidradenitis axillaris 04/20/2020   Followed by [facility]. Last seen 05/26/2020: instructed to wash b/l with Hibiclens and take Doxycycline as prescribed and follow up in one month.    History of COVID-19 12/21/2019   History of gestational diabetes 05/02/2017   Normal pp testing 10/2017   History of severe pre-eclampsia 05/02/2017   Hypertension    Impingement syndrome of left shoulder 02/16/2020   knee   Mastitis 06/18/2019   Migraine    PCOS (polycystic ovarian syndrome)    Pneumonia    Pregnancy induced hypertension    Skin lesions 09/12/2020   Past Surgical History:  Procedure Laterality Date   CESAREAN SECTION  2017   Procedure: CESAREAN DELIVERY ONLY; Surgeon: Marian Sorrow, MD; Location: C-SECTION SUITE [facility]; Service: Obstetrics   CESAREAN SECTION N/A 10/02/2017   Procedure: CESAREAN SECTION;  Surgeon: Tilda Burrow, MD;  Location: [facility] BIRTHING SUITES;  Service: Obstetrics;  Laterality: N/A;   CRANIOTOMY N/A 03/06/2023   Procedure: endoscopic endonasal resection of pituitary tumor;  Surgeon: Jadene Pierini, MD;  Location: [facility] OR;  Service: Neurosurgery;  Laterality: N/A;    DILATION AND EVACUATION N/A 03/06/2022   Procedure: DILATATION AND EVACUATION;  Surgeon: Hermina Staggers, MD;  Location: [facility] OR;  Service: Gynecology;  Laterality: N/A;   I & D EXTREMITY Left 10/14/2020   Procedure: Irrigation and debridement left small and ring finger with repair of tendon;  Surgeon: Dominica Severin, MD;  Location: [facility] OR;  Service: Orthopedics;  Laterality: Left;  1 hr Block with IV Sed   LAPAROSCOPIC OOPHERECTOMY Left    LEEP     OVARIAN CYST REMOVAL     TONSILLECTOMY     As a child   TRANSPHENOIDAL APPROACH EXPOSURE N/A 03/06/2023   Procedure: TRANSPHENOIDAL APPROACH EXPOSURE;  Surgeon: Cheron Schaumann A, DO;  Location: [facility] OR;  Service: ENT;  Laterality: N/A;   WISDOM TOOTH EXTRACTION      Current Outpatient Medications:    NEOMYCIN-POLYMYXIN-HYDROCORTISONE (CORTISPORIN) 1 % SOLN OTIC solution, Apply 1-2 drops to toe BID after soaking, Disp: 10 mL, Rfl: 1   aspirin-acetaminophen-caffeine (EXCEDRIN MIGRAINE) 250-250-65 MG tablet, Take 2 tablets by mouth every 6 (six) hours as needed for headache., Disp: , Rfl:    Continuous Glucose Sensor (FREESTYLE LIBRE 3 SENSOR) MISC, Place 1 sensor on the skin every 14 days. Use to check glucose continuously., Disp: 6 each, Rfl: 3   glipiZIDE (GLUCOTROL XL) 5 MG 24 hr tablet, TAKE 1 TABLET(5 MG) BY MOUTH DAILY WITH BREAKFAST FOR DIABETES, Disp: 90 tablet, Rfl: 1  HYDROcodone-acetaminophen (NORCO/VICODIN) 5-325 MG tablet, Take 1 tablet by mouth every 4 (four) hours as needed for moderate pain., Disp: 30 tablet, Rfl: 0   hydrocortisone (CORTEF) 10 MG tablet, Take 1 tablet (10 mg total) by mouth every evening., Disp: 10 tablet, Rfl: 0   hydrocortisone (CORTEF) 10 MG tablet, Take 1 tablet (10 mg total) by mouth daily., Disp: 3 tablet, Rfl: 0   hydrocortisone (CORTEF) 5 MG tablet, Take 5 tablets (25 mg total) by mouth every morning., Disp: 3 tablet, Rfl: 0   insulin glargine (LANTUS SOLOSTAR) 100 UNIT/ML Solostar Pen, Inject 25 Units into  the skin daily., Disp: 30 mL, Rfl: 0   insulin lispro (HUMALOG KWIKPEN) 100 UNIT/ML KwikPen, Inject 15 Units into the skin with breakfast, with lunch, and with evening meal., Disp: 15 mL, Rfl: 11   Insulin Pen Needle (TECHLITE PEN NEEDLES) 32G X 4 MM MISC, USE NIGHTLY WITH INSULIN, Disp: 100 each, Rfl: 1   norethindrone (MICRONOR) 0.35 MG tablet, Take 1 tablet (0.35 mg total) by mouth daily. (Patient not taking: Reported on 03/20/2023), Disp: 84 tablet, Rfl: 6   tirzepatide (MOUNJARO) 12.5 MG/0.5ML Pen, INJECT 12.5 MG ONCE A WEEK FOR DIABETES, Disp: 6 mL, Rfl: 0   valsartan (DIOVAN) 160 MG tablet, TAKE 1 TABLET BY MOUTH DAILY FOR BLOOD PRESSURE, Disp: 90 tablet, Rfl: 2  Allergies  Allergen Reactions   Ancef [Cefazolin] Hives and Swelling    Facial and neck swelling; Had reaction after receiving fentanyl and ancef in the OR   Fentanyl Hives and Swelling    Facial and neck swelling; Had reaction after receiving fentanyl and ancef in the OR   Shrimp [Shellfish Allergy] Swelling and Other (See Comments)    Swelling of throat   Chlorhexidine Hives, Itching and Rash   Morphine And Codeine Rash   Pseudoephedrine Hcl Other (See Comments)    Light headed/ dizzy   Review of Systems Objective:  There were no vitals filed for this visit.  General: Well developed, nourished, in no acute distress, alert and oriented x3   Dermatological: Skin is warm, dry and supple bilateral. Nails x 10 are well maintained; remaining integument appears unremarkable at this time. There are no open sores, no preulcerative lesions, no rash or signs of infection present.  Sharp incurvated nail margin along the tibial border of the hallux right with mild erythema no purulence no malodor no granulation.  Vascular: Dorsalis Pedis artery and Posterior Tibial artery pedal pulses are 2/4 bilateral with immedate capillary fill time. Pedal hair growth present. No varicosities and no lower extremity edema present bilateral.    Neruologic: Grossly intact via light touch bilateral. Vibratory intact via tuning fork bilateral. Protective threshold with Semmes Wienstein monofilament intact to all pedal sites bilateral. Patellar and Achilles deep tendon reflexes 2+ bilateral. No Babinski or clonus noted bilateral.   Musculoskeletal: No gross boney pedal deformities bilateral. No pain, crepitus, or limitation noted with foot and ankle range of motion bilateral. Muscular strength 5/5 in all groups tested bilateral.  Gait: Unassisted, Nonantalgic.    Radiographs:  None taken  Assessment & Plan:   Assessment: Pain in limb secondary to ingrown toenail tibial border hallux right  Plan: Chemical matricectomy was performed with sodium hydroxide and neutralized with acetic acid.  She was given both oral written home-going instructions for care and soaking of the toe as well as a prescription for Cortisporin Otic to be applied twice daily after soaking.  Follow-up with her in 2 weeks  Elinor Parkinson, DPM

## 2023-05-15 NOTE — Patient Instructions (Signed)

## 2023-05-29 ENCOUNTER — Ambulatory Visit (INDEPENDENT_AMBULATORY_CARE_PROVIDER_SITE_OTHER): Payer: 59 | Admitting: Podiatry

## 2023-05-29 ENCOUNTER — Encounter: Payer: Self-pay | Admitting: Podiatry

## 2023-05-29 DIAGNOSIS — L600 Ingrowing nail: Secondary | ICD-10-CM

## 2023-05-29 DIAGNOSIS — Z9889 Other specified postprocedural states: Secondary | ICD-10-CM

## 2023-05-29 NOTE — Progress Notes (Signed)
She presents today for nail check tibial border hallux right she states that she has been soaking and has been doing pretty well just a little tender still.  Objective: There is no erythema edema cellulitis drainage or odor.  Assessment: Well-healing surgical toe hallux right tibial border.  Plan: I recommended that she continue to soak Epsom salts and warm water for at least the next week or so cover during the day leave open at bedtime.  I will follow-up with her with any questions or concerns or regression.

## 2023-06-11 ENCOUNTER — Telehealth: Payer: Self-pay | Admitting: Primary Care

## 2023-06-11 ENCOUNTER — Encounter: Payer: Self-pay | Admitting: Primary Care

## 2023-06-11 ENCOUNTER — Ambulatory Visit (INDEPENDENT_AMBULATORY_CARE_PROVIDER_SITE_OTHER): Payer: 59 | Admitting: Primary Care

## 2023-06-11 VITALS — BP 132/80 | HR 90 | Temp 97.3°F | Ht 70.0 in | Wt 237.0 lb

## 2023-06-11 DIAGNOSIS — N939 Abnormal uterine and vaginal bleeding, unspecified: Secondary | ICD-10-CM

## 2023-06-11 DIAGNOSIS — E1165 Type 2 diabetes mellitus with hyperglycemia: Secondary | ICD-10-CM | POA: Diagnosis not present

## 2023-06-11 DIAGNOSIS — I10 Essential (primary) hypertension: Secondary | ICD-10-CM

## 2023-06-11 DIAGNOSIS — Z7985 Long-term (current) use of injectable non-insulin antidiabetic drugs: Secondary | ICD-10-CM

## 2023-06-11 LAB — POCT GLYCOSYLATED HEMOGLOBIN (HGB A1C): Hemoglobin A1C: 6.2 % — AB (ref 4.0–5.6)

## 2023-06-11 LAB — POCT URINE PREGNANCY: Preg Test, Ur: NEGATIVE

## 2023-06-11 MED ORDER — AMLODIPINE BESYLATE 2.5 MG PO TABS: 2.5000 mg | ORAL_TABLET | Freq: Every day | ORAL | 0 refills | Status: DC

## 2023-06-11 NOTE — Telephone Encounter (Signed)
Per appt notes pt already has appt with Allayne Gitelman NP on 06/11/23 at 3:00 pm. Sending note to Allayne Gitelman NP and [facility] pool.

## 2023-06-11 NOTE — Assessment & Plan Note (Signed)
Improved and controlled with A1c of 6.2 today.  Continue Mounjaro 12.5 mg weekly. Urine microalbumin due and pending.  Follow-up in 6 months.

## 2023-06-11 NOTE — Telephone Encounter (Signed)
Noted, will evaluate. 

## 2023-06-11 NOTE — Assessment & Plan Note (Signed)
Urine pregnancy negative today.  She will continue to monitor symptoms and update. If bleeding continues then we will obtain a transvaginal/pelvic ultrasound.

## 2023-06-11 NOTE — Assessment & Plan Note (Signed)
Slightly above goal initially, improved on recheck.  Home readings are worse.  Unclear cause for elevations in blood pressure readings, especially given 10 pound weight loss since last visit.  For now, add amlodipine 2.5 mg daily. Continue valsartan 160 mg daily.  Follow-up with surgeon as scheduled. Reviewed MRI brain from October 2024.

## 2023-06-11 NOTE — Patient Instructions (Signed)
Resume amlodipine 2.5 mg tablets once daily for blood pressure.  Follow-up with the surgeon as scheduled.  Continue Mounjaro 12.5 mg weekly.  Schedule your physical for 6 months from now.  It was a pleasure to see you today!

## 2023-06-11 NOTE — Progress Notes (Signed)
Subjective:    Patient ID: Julia Kline, female    DOB: April 08, 1986, 37 y.o.   MRN: [id number]  Hypertension Associated symptoms include headaches. Pertinent negatives include no chest pain or shortness of breath.    Julia Kline is a very pleasant 37 y.o. female with a history of type 2 diabetes, hypertension, migraines, pituitary adenoma who presents today to discuss blood pressure readings and follow-up of diabetes.  1) Hypertension: Currently managed on valsartan 160 mg daily. She recently began checking BP at home which is running 146/106, 140/104, 136/86, 133/93, 163/108, 152/109. She's also noticed intermittent, frontal headaches which is what prompted to check her BP. She's also noticed redness to her right eye for the last 2 weeks with photophobia at times.   She is post op for pituitary adenoma since July 2024.  She has follow-up scheduled with her surgeon next week.  She denies itching, drainage, eyes matted shut. She initially noticed pain to the eye, but this has improved since using allergy drops OTC. Her last eye exam was in February 2024.   BP Readings from Last 3 Encounters:  06/11/23 132/80  03/20/23 110/68  03/13/23 118/89     2) Type 2 Diabetes:  Current medications include: Mounjaro 12.5 mg weekly.  During her last visit her glipizide, Lantus, Humalog were discontinued due to significant proved glycemic control and side effects of hypoglycemia.  She is checking her blood glucose 0 times daily.  Last A1C: 6.5 in July 2024, 6.2 today  Last Eye Exam: Up-to-date Last Foot Exam: Up-to-date Pneumonia Vaccination: Never completed, declines Urine Microalbumin: Due soon. Statin: None  Dietary changes since last visit: Improved diet.    Exercise: No exercise. Active at work.   Wt Readings from Last 3 Encounters:  06/11/23 237 lb (107.5 kg)  03/20/23 247 lb (112 kg)  03/06/23 255 lb (115.7 kg)   3) Abnormal Uterine Bleeding: History of  irregular menstrual cycles for years. On 05/22/23 she began spotting. She's been spotting since.   Her LMP was 2 days, light, August 28th and 29th. No menses in July or September. She did have a cycle June 23-27, overall light.   She has not checked for pregnancy.   Review of Systems  Eyes:  Positive for photophobia and redness. Negative for pain, discharge and itching.  Respiratory:  Negative for shortness of breath.   Cardiovascular:  Negative for chest pain.  Genitourinary:  Positive for menstrual problem.  Neurological:  Positive for headaches.         Past Medical History:  Diagnosis Date   Acute conjunctivitis of right eye 07/10/2019   Acute pain of right knee 08/06/2019   Anemia 10/14/2019   Complicated UTI (urinary tract infection) 03/10/2021   COVID 2020   had pneumonia   COVID-19    Diabetes mellitus without complication (HCC)    Eczema    Hernia cerebri (HCC)    Hidradenitis axillaris 04/20/2020   Followed by [facility]. Last seen 05/26/2020: instructed to wash b/l with Hibiclens and take Doxycycline as prescribed and follow up in one month.    History of COVID-19 12/21/2019   History of gestational diabetes 05/02/2017   Normal pp testing 10/2017   History of severe pre-eclampsia 05/02/2017   Hypertension    Impingement syndrome of left shoulder 02/16/2020   knee   Mastitis 06/18/2019   Migraine    PCOS (polycystic ovarian syndrome)    Pneumonia    Pregnancy induced hypertension  Skin lesions 09/12/2020    Social History   Socioeconomic History   Marital status: Married    Spouse name: Not on file   Number of children: Not on file   Years of education: Not on file   Highest education level: Master's degree (e.g., MA, MS, MEng, MEd, MSW, MBA)  Occupational History   Not on file  Tobacco Use   Smoking status: Never   Smokeless tobacco: Never  Vaping Use   Vaping status: Never Used  Substance and Sexual Activity   Alcohol use: No    Drug use: No   Sexual activity: Yes    Birth control/protection: None  Other Topics Concern   Not on file  Social History Narrative   Married.   2 children.   Works at [employer].   Social Determinants of Health   Financial Resource Strain: Low Risk  (12/14/2022)   Overall Financial Resource Strain (CARDIA)    Difficulty of Paying Living Expenses: Not very hard  Food Insecurity: Low Risk  (03/27/2023)   Received from [facility]   Hunger Vital Sign    Worried About Running Out of Food in the Last Year: Never true    Ran Out of Food in the Last Year: Never true  Transportation Needs: No Transportation Needs (03/08/2023)   PRAPARE - Administrator, Civil Service (Medical): No    Lack of Transportation (Non-Medical): No  Physical Activity: Sufficiently Active (12/14/2022)   Exercise Vital Sign    Days of Exercise per Week: 6 days    Minutes of Exercise per Session: 150+ min  Stress: Not on file  Social Connections: Moderately Integrated (12/14/2022)   Social Connection and Isolation Panel [NHANES]    Frequency of Communication with Friends and Family: More than three times a week    Frequency of Social Gatherings with Friends and Family: Once a week    Attends Religious Services: More than 4 times per year    Active Member of [employer] or Organizations: No    Attends Banker Meetings: Not on file    Marital Status: Married  Intimate Partner Violence: Not At Risk (03/08/2023)   Humiliation, Afraid, Rape, and Kick questionnaire    Fear of Current or Ex-Partner: No    Emotionally Abused: No    Physically Abused: No    Sexually Abused: No    Past Surgical History:  Procedure Laterality Date   CESAREAN SECTION  2017   Procedure: CESAREAN DELIVERY ONLY; Surgeon: Marian Sorrow, MD; Location: [facility]; Service: Obstetrics   CESAREAN SECTION N/A 10/02/2017   Procedure: CESAREAN SECTION;  Surgeon: Tilda Burrow, MD;  Location: [facility];  Service: Obstetrics;  Laterality: N/A;   CRANIOTOMY N/A 03/06/2023   Procedure: endoscopic endonasal resection of pituitary tumor;  Surgeon: Jadene Pierini, MD;  Location: [facility] OR;  Service: Neurosurgery;  Laterality: N/A;   DILATION AND EVACUATION N/A 03/06/2022   Procedure: DILATATION AND EVACUATION;  Surgeon: Hermina Staggers, MD;  Location: [facility] OR;  Service: Gynecology;  Laterality: N/A;   I & D EXTREMITY Left 10/14/2020   Procedure: Irrigation and debridement left small and ring finger with repair of tendon;  Surgeon: Dominica Severin, MD;  Location: [facility] OR;  Service: Orthopedics;  Laterality: Left;  1 hr Block with IV Sed   LAPAROSCOPIC OOPHERECTOMY Left    LEEP     OVARIAN CYST REMOVAL     TONSILLECTOMY     As  a child   TRANSPHENOIDAL APPROACH EXPOSURE N/A 03/06/2023   Procedure: TRANSPHENOIDAL APPROACH EXPOSURE;  Surgeon: Laren Boom, DO;  Location: [facility] OR;  Service: ENT;  Laterality: N/A;   WISDOM TOOTH EXTRACTION      Family History  Problem Relation Age of Onset   Diabetes Mother    Migraines Mother    Diabetes Father    Hypertension Father    Hypertension Maternal Grandmother    Diabetes Maternal Grandmother    Diabetes Maternal Grandfather    COPD Maternal Grandfather    Diabetes Paternal Grandmother    Hypertension Paternal Grandmother    Diabetes Paternal Grandfather    Hypertension Paternal Grandfather    Heart attack Paternal Grandfather    Asthma Sister     Allergies  Allergen Reactions   Ancef [Cefazolin] Hives and Swelling    Facial and neck swelling; Had reaction after receiving fentanyl and ancef in the OR   Fentanyl Hives and Swelling    Facial and neck swelling; Had reaction after receiving fentanyl and ancef in the OR   Shrimp [Shellfish Allergy] Swelling and Other (See Comments)    Swelling of throat   Chlorhexidine Hives, Itching and Rash   Morphine And Codeine Rash   Pseudoephedrine Hcl Other (See Comments)    Light  headed/ dizzy    Current Outpatient Medications on File Prior to Visit  Medication Sig Dispense Refill   aspirin-acetaminophen-caffeine (EXCEDRIN MIGRAINE) 250-250-65 MG tablet Take 2 tablets by mouth every 6 (six) hours as needed for headache.     Continuous Glucose Sensor (FREESTYLE LIBRE 3 SENSOR) MISC Place 1 sensor on the skin every 14 days. Use to check glucose continuously. 6 each 3   tirzepatide (MOUNJARO) 12.5 MG/0.5ML Pen INJECT 12.5 MG ONCE A WEEK FOR DIABETES 6 mL 0   valsartan (DIOVAN) 160 MG tablet TAKE 1 TABLET BY MOUTH DAILY FOR BLOOD PRESSURE 90 tablet 2   No current facility-administered medications on file prior to visit.    BP 132/80   Pulse 90   Temp (!) 97.3 F (36.3 C) (Temporal)   Ht 5\' 10"  (1.778 m)   Wt 237 lb (107.5 kg)   SpO2 99%   BMI 34.01 kg/m  Objective:   Physical Exam Eyes:     Extraocular Movements: Extraocular movements intact.  Cardiovascular:     Rate and Rhythm: Normal rate and regular rhythm.  Pulmonary:     Effort: Pulmonary effort is normal.     Breath sounds: Normal breath sounds.  Musculoskeletal:     Cervical back: Neck supple.  Skin:    General: Skin is warm and dry.  Neurological:     Mental Status: She is alert and oriented to person, place, and time.     Cranial Nerves: No cranial nerve deficit.     Sensory: No sensory deficit.  Psychiatric:        Mood and Affect: Mood normal.           Assessment & Plan:  Type 2 diabetes mellitus with hyperglycemia, without long-term current use of insulin (HCC) Assessment & Plan: Improved and controlled with A1c of 6.2 today.  Continue Mounjaro 12.5 mg weekly. Urine microalbumin due and pending.  Follow-up in 6 months.  Orders: -     POCT glycosylated hemoglobin (Hb A1C) -     Microalbumin / creatinine urine ratio -     POCT urine pregnancy  Abnormal uterine bleeding Assessment & Plan: Urine pregnancy negative today.  She will  continue to monitor symptoms and  update. If bleeding continues then we will obtain a transvaginal/pelvic ultrasound.  Orders: -     POCT urine pregnancy  Primary hypertension Assessment & Plan: Slightly above goal initially, improved on recheck.  Home readings are worse.  Unclear cause for elevations in blood pressure readings, especially given 10 pound weight loss since last visit.  For now, add amlodipine 2.5 mg daily. Continue valsartan 160 mg daily.  Follow-up with surgeon as scheduled. Reviewed MRI brain from October 2024.  Orders: -     amLODIPine Besylate; Take 1 tablet (2.5 mg total) by mouth daily. for blood pressure.  Dispense: 90 tablet; Refill: 0        Doreene Nest, NP

## 2023-06-11 NOTE — Telephone Encounter (Signed)
FYI: This call has been transferred to Access Nurse. Once the result note has been entered staff can address the message at that time.  Patient called in with the following symptoms:  Red Word:elevated blood pressure Patient called in stating that her blood pressure was 149/106 and she is experiencing a headache and her eye is red.   Please advise at Mobile [phone] (mobile)  Message is routed to Provider Pool and [facility] Triage

## 2023-06-12 ENCOUNTER — Ambulatory Visit: Payer: 59 | Admitting: Primary Care

## 2023-06-12 LAB — MICROALBUMIN / CREATININE URINE RATIO
Creatinine,U: 87.8 mg/dL
Microalb Creat Ratio: 1.8 mg/g (ref 0.0–30.0)
Microalb, Ur: 1.5 mg/dL (ref 0.0–1.9)

## 2023-06-12 MED ORDER — FREESTYLE LIBRE 3 PLUS SENSOR MISC: 1 refills | Status: DC

## 2023-06-19 ENCOUNTER — Ambulatory Visit: Payer: 59 | Admitting: Primary Care

## 2023-07-05 ENCOUNTER — Encounter: Payer: Self-pay | Admitting: "Endocrinology

## 2023-07-08 ENCOUNTER — Telehealth (INDEPENDENT_AMBULATORY_CARE_PROVIDER_SITE_OTHER): Payer: 59 | Admitting: "Endocrinology

## 2023-07-08 ENCOUNTER — Encounter: Payer: Self-pay | Admitting: "Endocrinology

## 2023-07-08 ENCOUNTER — Other Ambulatory Visit: Payer: Self-pay | Admitting: "Endocrinology

## 2023-07-08 DIAGNOSIS — E893 Postprocedural hypopituitarism: Secondary | ICD-10-CM

## 2023-07-08 DIAGNOSIS — R7989 Other specified abnormal findings of blood chemistry: Secondary | ICD-10-CM

## 2023-07-08 NOTE — Progress Notes (Signed)
The patient reports they are currently: [name]. I spent 6 minutes on the video with the patient on the date of service. I spent an additional 15 minutes on pre- and post-visit activities on the date of service.   The patient was physically located in [place] or a state in which I am permitted to provide care. The patient and/or parent/guardian understood that s/he may incur co-pays and cost sharing, and agreed to the telemedicine visit. The visit was reasonable and appropriate under the circumstances given the patient's presentation at the time.  The patient and/or parent/guardian has been advised of the potential risks and limitations of this mode of treatment (including, but not limited to, the absence of in-person examination) and has agreed to be treated using telemedicine. The patient's/patient's family's questions regarding telemedicine have been answered.   The patient and/or parent/guardian has also been advised to contact their provider's office for worsening conditions, and seek emergency medical treatment and/or call 911 if the patient deems either necessary.     Outpatient Endocrinology Note Julia Wurtland, MD    Julia Kline Jul 15, 1986 [id number]  Referring Provider: Doreene Nest, NP Primary Care Provider: Doreene Nest, NP Reason for consultation: Subjective   Assessment & Plan  Diagnoses and all orders for this visit:  Status post transsphenoidal pituitary resection (HCC)  Low serum cortisol level     Patient found to have 1.7 cm pituitary macroadenoma with suprasellar involvement and chiasmatic impingement on MRI done on 12/11/22 Patient reports developing complete amenorrhea compared to her regular menstrual cycle starting November 2023 as well as decrease in libido around the same time, some recent acne noted on face Patient does not have any other symptoms pertaining any other hormones at this time Prolactin levels has been done twice with  levels between 37-39, last done a month ago Ordered other baseline labs, based on repeat prolactin may consider a trial of cabergoline Ordered urgent referral to neurosurgery: s/p  endonasal resection for nonfunctioning pituitary adenoma at [facility] by neurosurgeon Dr. Autumn Patty on 03/06/23. Per notes, she tolerated surgery well, no complications  12/21/22 Pre-op labs showed mildly elevated prolactin at 47.6  06/26/23 Post op, 7:30am cortisol is low at 5.7, patient has no symptoms of low cortisol except weight loss (on mounjaro)-normal Ft4, prolactin IGF-1 (LH>FSH, 9.8>4.2) Will f/u with ACTH stim test   Return in about 6 months (around 01/05/2024).   I spent more than 50% of today's visit counseling patient on symptoms, examination findings, lab findings, imaging results, treatment decisions and monitoring and prognosis. The patient understood the recommendations and agrees with the treatment plan. All questions regarding treatment plan were fully answered  Julia [place], MD    History of Present Illness Head aches once in a while No vision issues Saw ophthalmologist Denies any abdominal pain/nausea/vomiting Reports weight loss, 15 lbs but on mounjaro  Reports good energy Got 7:30 am labs  Initial history:  Julia Kline is a 37 y.o. female referred by Dr. Shawnie Pons for evaluation and management of pituitary macroadenoma with suprasellar involvement and chiasmatic impingement on MRI done on 12/11/22.  Menstrual cycle stopped in Nov' 2023. No galactorrhea No vision issues No head aches/vomiting  She reports the following;  Headaches - visual blurring/ diplopia/ fields defect - Galactorrhea-   change in facial appearance - body habitus - change in her hand, ring, hat, shoe size - hyperhidrosis - arthralgias -  fatigue - weight change - change in appetite - heat/cold intolerance -  change in bowel movements - change in muscle strength - changes in skin or hair  - sweats  - palpitations  - insomnia  - tremor -   moon faces - fat pads - increased girth  - plethora hyperpigmentation -  purple striae - acne +  vellus/terminal hirsutism - proximal muscle weakness - nausea/vomiting - lightheadedness - abdominal pain -  change in libido +, decreased since a 06/2022 change in muscle strength/mass - change in body hair - hot flashes - night sweats -  Family history is negative for pituitary tumor or other abnormalities concerning for MEN syndrome.     On: 12/11/2022 05:36: images reviewed and interpreted independently  CLINICAL DATA:  Secondary amenorrhea   EXAM: MRI HEAD WITHOUT AND WITH CONTRAST   TECHNIQUE: Multiplanar, multiecho pulse sequences of the brain and surrounding structures were obtained without and with intravenous contrast.   CONTRAST:  10mL GADAVIST GADOBUTROL 1 MMOL/ML IV SOLN   COMPARISON:  Head CT 07/26/2022   FINDINGS: Brain: 17 mm mass inseparable from the pituitary gland. The mass is primarily cystic but there is eccentric mural thickening/nodularity especially along the superior aspect. Craniocaudal extent is 17 mm with upward bulging and chiasmatic impaction. No cavernous sinus invasion.   No infarct, hemorrhage, hydrocephalus, or collection. Brain volume is normal.   Vascular: Normal flow voids and vascular enhancements.   Skull and upper cervical spine: Normal marrow signal.   Sinuses/Orbits: Leftward nasal septal deviation.   IMPRESSION: 17 mm solid and cystic pituitary mass compatible with adenoma. There is suprasellar involvement with chiasmatic impingement.    07/26/2022 CT HEAD No pathology. No comment on pituitary.   Physical Exam  There were no vitals taken for this visit.   Constitutional: well developed, well nourished Head: normocephalic, atraumatic Eyes: sclera anicteric, no redness, intact temporal region although patient seems confused when answering on the right side  initially saying no and then yes Neck: supple Lungs: normal respiratory effort Neurology: alert and oriented Skin: dry, no appreciable rashes Musculoskeletal: no appreciable defects Psychiatric: normal mood and affect   Current Medications Patient's Medications  New Prescriptions   No medications on file  Previous Medications   AMLODIPINE (NORVASC) 2.5 MG TABLET    Take 1 tablet (2.5 mg total) by mouth daily. for blood pressure.   ASPIRIN-ACETAMINOPHEN-CAFFEINE (EXCEDRIN MIGRAINE) 250-250-65 MG TABLET    Take 2 tablets by mouth every 6 (six) hours as needed for headache.   CONTINUOUS GLUCOSE SENSOR (FREESTYLE LIBRE 3 PLUS SENSOR) MISC    Use to check blood sugar continuously. Change sensor every 15 days.   TIRZEPATIDE (MOUNJARO) 12.5 MG/0.5ML PEN    INJECT 12.5 MG ONCE A WEEK FOR DIABETES   VALSARTAN (DIOVAN) 160 MG TABLET    TAKE 1 TABLET BY MOUTH DAILY FOR BLOOD PRESSURE  Modified Medications   No medications on file  Discontinued Medications   No medications on file    Allergies Allergies  Allergen Reactions   Ancef [Cefazolin] Hives and Swelling    Facial and neck swelling; Had reaction after receiving fentanyl and ancef in the OR   Fentanyl Hives and Swelling    Facial and neck swelling; Had reaction after receiving fentanyl and ancef in the OR   Shrimp [Shellfish Allergy] Swelling and Other (See Comments)    Swelling of throat   Chlorhexidine Hives, Itching and Rash   Morphine And Codeine Rash   Pseudoephedrine Hcl Other (See Comments)    Light headed/ dizzy    Past Medical  History Past Medical History:  Diagnosis Date   Acute conjunctivitis of right eye 07/10/2019   Acute pain of right knee 08/06/2019   Anemia 10/14/2019   Complicated UTI (urinary tract infection) 03/10/2021   COVID 2020   had pneumonia   COVID-19    Diabetes mellitus without complication (HCC)    Eczema    Hernia cerebri (HCC)    Hidradenitis axillaris 04/20/2020   Followed by [facility]. Last seen 05/26/2020: instructed to wash b/l with Hibiclens and take Doxycycline as prescribed and follow up in one month.    History of COVID-19 12/21/2019   History of gestational diabetes 05/02/2017   Normal pp testing 10/2017   History of severe pre-eclampsia 05/02/2017   Hypertension    Impingement syndrome of left shoulder 02/16/2020   knee   Mastitis 06/18/2019   Migraine    PCOS (polycystic ovarian syndrome)    Pneumonia    Pregnancy induced hypertension    Skin lesions 09/12/2020    Past Surgical History Past Surgical History:  Procedure Laterality Date   CESAREAN SECTION  2017   Procedure: CESAREAN DELIVERY ONLY; Surgeon: Marian Sorrow, MD; Location: [facility] SU[facility]; Service: Obstetrics   CESAREAN SECTION N/A 10/02/2017   Procedure: CESAREAN SECTION;  Surgeon: Tilda Burrow, MD;  Location: [facility] SUITES;  Service: Obstetrics;  Laterality: N/A;   CRANIOTOMY N/A 03/06/2023   Procedure: endoscopic endonasal resection of pituitary tumor;  Surgeon: Jadene Pierini, MD;  Location: [facility] OR;  Service: Neurosurgery;  Laterality: N/A;   DILATION AND EVACUATION N/A 03/06/2022   Procedure: DILATATION AND EVACUATION;  Surgeon: Hermina Staggers, MD;  Location: [facility] OR;  Service: Gynecology;  Laterality: N/A;   I & D EXTREMITY Left 10/14/2020   Procedure: Irrigation and debridement left small and ring finger with repair of tendon;  Surgeon: Dominica Severin, MD;  Location: [facility] OR;  Service: Orthopedics;  Laterality: Left;  1 hr Block with IV Sed   LAPAROSCOPIC OOPHERECTOMY Left    LEEP     OVARIAN CYST REMOVAL     TONSILLECTOMY     As a child   TRANSPHENOIDAL APPROACH EXPOSURE N/A 03/06/2023   Procedure: TRANSPHENOIDAL APPROACH EXPOSURE;  Surgeon: Laren Boom, DO;  Location: [facility] OR;  Service: ENT;  Laterality: N/A;   WISDOM TOOTH EXTRACTION      Family History family history includes Asthma in her sister; COPD in her maternal grandfather;  Diabetes in her father, maternal grandfather, maternal grandmother, mother, paternal grandfather, and paternal grandmother; Heart attack in her paternal grandfather; Hypertension in her father, maternal grandmother, paternal grandfather, and paternal grandmother; Migraines in her mother.  Social History Social History   Socioeconomic History   Marital status: Married    Spouse name: Not on file   Number of children: Not on file   Years of education: Not on file   Highest education level: Master's degree (e.g., MA, MS, MEng, MEd, MSW, MBA)  Occupational History   Not on file  Tobacco Use   Smoking status: Never   Smokeless tobacco: Never  Vaping Use   Vaping status: Never Used  Substance and Sexual Activity   Alcohol use: No   Drug use: No   Sexual activity: Yes    Birth control/protection: None  Other Topics Concern   Not on file  Social History Narrative   Married.   2 children.   Works at [employer].   Social Determinants of Corporate investment banker  Strain: Low Risk  (12/14/2022)   Overall Financial Resource Strain (CARDIA)    Difficulty of Paying Living Expenses: Not very hard  Food Insecurity: Low Risk  (03/27/2023)   Received from [facility]   Hunger Vital Sign    Worried About Running Out of Food in the Last Year: Never true    Ran Out of Food in the Last Year: Never true  Transportation Needs: No Transportation Needs (03/08/2023)   PRAPARE - Administrator, Civil Service (Medical): No    Lack of Transportation (Non-Medical): No  Physical Activity: Sufficiently Active (12/14/2022)   Exercise Vital Sign    Days of Exercise per Week: 6 days    Minutes of Exercise per Session: 150+ min  Stress: Not on file  Social Connections: Moderately Integrated (12/14/2022)   Social Connection and Isolation Panel [NHANES]    Frequency of Communication with Friends and Family: More than three times a week    Frequency of Social Gatherings with Friends and  Family: Once a week    Attends Religious Services: More than 4 times per year    Active Member of Clubs or Organizations: No    Attends Engineer, structural: Not on file    Marital Status: Married  Intimate Partner Violence: Not At Risk (03/08/2023)   Humiliation, Afraid, Rape, and Kick questionnaire    Fear of Current or Ex-Partner: No    Emotionally Abused: No    Physically Abused: No    Sexually Abused: No    Lab Results  Component Value Date   TSH 0.98 12/21/2022   TSH 0.41 10/11/2022   FSH 1.9 12/21/2022   FSH 6.9 11/14/2022   LH 1.02 12/21/2022   PROLACTIN 39.6 (H) 11/21/2022   PROLACTIN 37.1 (H) 11/14/2022   PROLACTIN,UNDILUTED 47.6 (H) 12/21/2022   CORTISOL PLASMA 7.9 12/21/2022   GROWTH HORMONE 0.1 12/21/2022   ESTRADIOL 61 12/21/2022   FREE T4 0.65 12/21/2022    Lab Results  Component Value Date   CHOL 137 11/30/2021   Lab Results  Component Value Date   HDL 45.00 11/30/2021   Lab Results  Component Value Date   LDLCALC 72 11/30/2021   Lab Results  Component Value Date   TRIG 100.0 11/30/2021   Lab Results  Component Value Date   CHOLHDL 3 11/30/2021   Lab Results  Component Value Date   CREATININE 0.76 03/12/2023   Lab Results  Component Value Date   GFR 95.85 10/11/2022      Component Value Date/Time   NA 128 (L) 03/12/2023 0843   NA 145 (H) 10/09/2017 1500   K 3.9 03/12/2023 0843   CL 91 (L) 03/12/2023 0843   CO2 27 03/12/2023 0843   GLUCOSE 238 (H) 03/12/2023 0843   BUN 9 03/12/2023 0843   BUN 8 10/09/2017 1500   CREATININE 0.76 03/12/2023 0843   CALCIUM 8.4 (L) 03/12/2023 0843   PROT 8.1 07/26/2022 1443   PROT 5.7 (L) 10/09/2017 1500   ALBUMIN 4.1 07/26/2022 1443   ALBUMIN 2.7 (L) 10/09/2017 1500   AST 48 (H) 07/26/2022 1443   ALT 66 (H) 07/26/2022 1443   ALKPHOS 70 07/26/2022 1443   BILITOT 0.2 (L) 07/26/2022 1443   BILITOT <0.2 10/09/2017 1500   GFRNONAA >60 03/12/2023 0843   GFRAA >60 03/25/2020 1513       Latest Ref Rng & Units 03/12/2023    8:43 AM 03/07/2023   11:29 AM 03/06/2023    3:18 PM  BMP  Glucose 70 - 99 mg/dL 884  166    BUN 6 - 20 mg/dL 9  7    Creatinine 0.63 - 1.00 mg/dL 0.16  0.10  9.32   Sodium 135 - 145 mmol/L 128  135    Potassium 3.5 - 5.1 mmol/L 3.9  3.6    Chloride 98 - 111 mmol/L 91  103    CO2 22 - 32 mmol/L 27  25    Calcium 8.9 - 10.3 mg/dL 8.4  8.5         Component Value Date/Time   WBC 12.0 (H) 03/06/2023 1518   RBC 4.74 03/06/2023 1518   HGB 10.6 (L) 03/06/2023 1518   HGB 9.9 (L) 10/13/2019 1605   HGB 10.8 04/01/2017 0000   HCT 35.1 (L) 03/06/2023 1518   HCT 31.7 (L) 10/13/2019 1605   HCT 34 04/01/2017 0000   PLT 244 03/06/2023 1518   PLT 296 10/13/2019 1605   PLT 252 04/01/2017 0000   MCV 74.1 (L) 03/06/2023 1518   MCV 72 (L) 10/13/2019 1605   MCH 22.4 (L) 03/06/2023 1518   MCHC 30.2 03/06/2023 1518   RDW 14.7 03/06/2023 1518   RDW 14.3 10/13/2019 1605   LYMPHSABS 2.4 06/25/2022 2145   MONOABS 0.8 06/25/2022 2145   EOSABS 0.1 06/25/2022 2145   BASOSABS 0.1 06/25/2022 2145     Parts of this note may have been dictated using voice recognition software. There may be variances in spelling and vocabulary which are unintentional. Not all errors are proofread. Please notify the Thereasa Parkin if any discrepancies are noted or if the meaning of any statement is not clear.

## 2023-07-09 ENCOUNTER — Other Ambulatory Visit: Payer: Self-pay

## 2023-07-09 DIAGNOSIS — R7989 Other specified abnormal findings of blood chemistry: Secondary | ICD-10-CM

## 2023-07-17 ENCOUNTER — Other Ambulatory Visit: Payer: 59

## 2023-07-17 ENCOUNTER — Ambulatory Visit: Payer: 59

## 2023-07-17 DIAGNOSIS — R7989 Other specified abnormal findings of blood chemistry: Secondary | ICD-10-CM | POA: Diagnosis not present

## 2023-07-17 MED ORDER — COSYNTROPIN 0.25 MG IJ SOLR
0.2500 mg | Freq: Once | INTRAMUSCULAR | Status: AC
Start: 2023-07-17 — End: 2023-07-17
  Administered 2023-07-17: 0.25 mg via INTRAVENOUS

## 2023-07-17 NOTE — Progress Notes (Signed)
After obtaining consent, and per orders of Dr. Roosevelt Locks, injection of Cosyntropin given by Leota Sauers. Patient instructed to remain in clinic for 20 minutes afterwards, and to report any adverse reaction to me immediately.

## 2023-07-19 LAB — CORTISOL, 3 SPECIMEN
SPECIMEN 2: 30.7 ug/dL
Specimen: 23.6 ug/dL
Specimen: 3.7 ug/dL
Time 1: 930
Time.: 900
Time: 830

## 2023-07-23 ENCOUNTER — Other Ambulatory Visit: Payer: Self-pay | Admitting: Primary Care

## 2023-07-23 DIAGNOSIS — E1165 Type 2 diabetes mellitus with hyperglycemia: Secondary | ICD-10-CM

## 2023-07-23 MED ORDER — MOUNJARO 12.5 MG/0.5ML ~~LOC~~ SOAJ
12.5000 mg | SUBCUTANEOUS | 0 refills | Status: DC
Start: 2023-07-23 — End: 2023-10-15

## 2023-07-24 ENCOUNTER — Encounter: Payer: Self-pay | Admitting: "Endocrinology

## 2023-07-25 ENCOUNTER — Other Ambulatory Visit: Payer: Self-pay | Admitting: "Endocrinology

## 2023-07-25 ENCOUNTER — Other Ambulatory Visit: Payer: Self-pay

## 2023-07-25 DIAGNOSIS — D352 Benign neoplasm of pituitary gland: Secondary | ICD-10-CM

## 2023-09-03 ENCOUNTER — Encounter: Payer: Self-pay | Admitting: Neurology

## 2023-09-03 ENCOUNTER — Ambulatory Visit: Payer: 59 | Admitting: Neurology

## 2023-09-03 VITALS — BP 133/97 | HR 88 | Ht 69.0 in | Wt 225.0 lb

## 2023-09-03 DIAGNOSIS — G43711 Chronic migraine without aura, intractable, with status migrainosus: Secondary | ICD-10-CM

## 2023-09-03 MED ORDER — TOPIRAMATE 50 MG PO TABS
ORAL_TABLET | ORAL | 6 refills | Status: DC
Start: 2023-09-03 — End: 2024-04-02

## 2023-09-03 MED ORDER — RIZATRIPTAN BENZOATE 10 MG PO TBDP: 10.0000 mg | ORAL_TABLET | ORAL | 11 refills | Status: DC | PRN

## 2023-09-03 NOTE — Patient Instructions (Addendum)
 Preventative: Topiramate  is  traditional migraine preventative. If you fail/have side effects to the topiramate  would tryThe new medications include Ajovy, Emgality, Aimovig, Qulipta  Acute management: Rizatriptan  : Please take one tablet at the onset of your headache. If it does not improve the symptoms please take one additional tablet. Do not take more then 2 tablets in 24hrs. Do not take use more then 2 to 3 times in a week. If you have side effects to this try anohter triptan (such as eletriptan) new medications Nurtec, Ubrelvy.   Rizatriptan  Disintegrating Tablets What is this medication? RIZATRIPTAN  (rye za TRIP tan) treats migraines. It works by blocking pain signals and narrowing blood vessels in the brain. It belongs to a group of medications called triptans. It is not used to prevent migraines. This medicine may be used for other purposes; ask your health care provider or pharmacist if you have questions. COMMON BRAND NAME(S): Maxalt -MLT What should I tell my care team before I take this medication? They need to know if you have any of these conditions: Circulation problems in fingers and toes Diabetes Heart disease High blood pressure High cholesterol History of irregular heartbeat History of stroke Stomach or intestine problems Tobacco use An unusual or allergic reaction to rizatriptan , other medications, foods, dyes, or preservatives Pregnant or trying to get pregnant Breast-feeding How should I use this medication? Take this medication by mouth. Take it as directed on the prescription label. You do not need water to take this medication. Leave the tablet in the sealed pack until you are ready to take it. With dry hands, open the pack and gently remove the tablet. If the tablet breaks or crumbles, throw it away. Use a new tablet. Place the tablet on the tongue and allow it to dissolve. Then, swallow it. Do not cut, crush, or chew this medication. Do not use it more often than  directed. Talk to your care team about the use of this medication in children. While it may be prescribed for children as young as 6 years for selected conditions, precautions do apply. Overdosage: If you think you have taken too much of this medicine contact a poison control center or emergency room at once. NOTE: This medicine is only for you. Do not share this medicine with others. What if I miss a dose? This does not apply. This medication is not for regular use. What may interact with this medication? Do not take this medication with any of the following: Ergot alkaloids, such as dihydroergotamine, ergotamine MAOIs, such as Marplan, Nardil, Parnate Other medications for migraine headache, such as almotriptan, eletriptan, frovatriptan, naratriptan, sumatriptan, zolmitriptan This medication may also interact with the following: Certain medications for depression, anxiety, or other mental health conditions Propranolol This list may not describe all possible interactions. Give your health care provider a list of all the medicines, herbs, non-prescription drugs, or dietary supplements you use. Also tell them if you smoke, drink alcohol, or use illegal drugs. Some items may interact with your medicine. What should I watch for while using this medication? Visit your care team for regular checks on your progress. Tell your care team if your symptoms do not start to get better or if they get worse. This medication may affect your coordination, reaction time, or judgment. Do not drive or operate machinery until you know how this medication affects you. Sit up or stand slowly to reduce the risk of dizzy or fainting spells. If you take migraine medications for 10 or more days  a month, your migraines may get worse. Keep a diary of headache days and medication use. Contact your care team if your migraine attacks occur more frequently. What side effects may I notice from receiving this medication? Side  effects that you should report to your care team as soon as possible: Allergic reactions--skin rash, itching, hives, swelling of the face, lips, tongue, or throat Burning, pain, tingling, or color changes in the hands, arms, legs, or feet Heart attack--pain or tightness in the chest, shoulders, arms, or jaw, nausea, shortness of breath, cold or clammy skin, feeling faint or lightheaded Heart rhythm changes--fast or irregular heartbeat, dizziness, feeling faint or lightheaded, chest pain, trouble breathing Increase in blood pressure Irritability, confusion, fast or irregular heartbeat, muscle stiffness, twitching muscles, sweating, high fever, seizure, chills, vomiting, diarrhea, which may be signs of serotonin syndrome Raynaud syndrome--cool, numb, or painful fingers or toes that may change color from pale, to blue, to red Seizures Stroke--sudden numbness or weakness of the face, arm, or leg, trouble speaking, confusion, trouble walking, loss of balance or coordination, dizziness, severe headache, change in vision Sudden or severe stomach pain, bloody diarrhea, fever, nausea, vomiting Vision loss Side effects that usually do not require medical attention (report to your care team if they continue or are bothersome): Dizziness Unusual weakness or fatigue This list may not describe all possible side effects. Call your doctor for medical advice about side effects. You may report side effects to FDA at 1-800-FDA-1088. Where should I keep my medication? Keep out of the reach of children and pets. Store at room temperature between 15 and 30 degrees C (59 and 86 degrees F). Protect from light and moisture. Get rid of any unused medication after the expiration date. To get rid of medications that are no longer needed or have expired: Take the medication to a medication take-back program. Check with your pharmacy or law enforcement to find a location. If you cannot return the medication, check the label  or package insert to see if the medication should be thrown out in the garbage or flushed down the toilet. If you are not sure, ask your care team. If it is safe to put it in the trash, empty the medication out of the container. Mix the medication with cat litter, dirt, coffee grounds, or other unwanted substance. Seal the mixture in a bag or container. Put it in the trash. NOTE: This sheet is a summary. It may not cover all possible information. If you have questions about this medicine, talk to your doctor, pharmacist, or health care provider.  2024 Elsevier/Gold Standard (2021-12-07 00:00:00) Topiramate  Tablets What is this medication? TOPIRAMATE  (toe PYRE a mate) prevents and controls seizures in people with epilepsy. It may also be used to prevent migraine headaches. It works by calming overactive nerves in your body. This medicine may be used for other purposes; ask your health care provider or pharmacist if you have questions. COMMON BRAND NAME(S): Topamax , Topiragen What should I tell my care team before I take this medication? They need to know if you have any of these conditions: Bleeding disorder Kidney disease Lung disease Suicidal thoughts, plans, or attempt by you or a family member An unusual or allergic reaction to topiramate , other medications, foods, dyes, or preservatives Pregnant or trying to get pregnant Breast-feeding How should I use this medication? Take this medication by mouth with water. Take it as directed on the prescription label at the same time every day. Do not cut, crush or  chew this medicine. Swallow the tablets whole. You can take it with or without food. If it upsets your stomach, take it with food. Keep taking it unless your care team tells you to stop. A special MedGuide will be given to you by the pharmacist with each prescription and refill. Be sure to read this information carefully each time. Talk to your care team about the use of this medication in  children. While it may be prescribed for children as young as 2 years for selected conditions, precautions do apply. Overdosage: If you think you have taken too much of this medicine contact a poison control center or emergency room at once. NOTE: This medicine is only for you. Do not share this medicine with others. What if I miss a dose? If you miss a dose, take it as soon as you can unless it is within 6 hours of the next dose. If it is within 6 hours of the next dose, skip the missed dose. Take the next dose at the normal time. Do not take double or extra doses. What may interact with this medication? Acetazolamide Alcohol Antihistamines for allergy, cough, and cold Aspirin  and aspirin -like medications Atropine Certain medications for anxiety or sleep Certain medications for bladder problems, such as oxybutynin, tolterodine Certain medications for depression, such as amitriptyline, fluoxetine, sertraline Certain medications for Parkinson disease, such as benztropine, trihexyphenidyl Certain medications for seizures, such as carbamazepine, lamotrigine, phenobarbital, phenytoin, primidone, valproic acid, zonisamide Certain medications for stomach problems, such as dicyclomine, hyoscyamine Certain medications for travel sickness, such as scopolamine  Certain medications that treat or prevent blood clots, such as warfarin, enoxaparin , dalteparin, apixaban, dabigatran, rivaroxaban Digoxin Diltiazem Estrogen and progestin hormones General anesthetics, such as halothane, isoflurane, methoxyflurane, propofol  Glyburide  Hydrochlorothiazide  Ipratropium Lithium Medications that relax muscles Metformin  NSAIDs, medications for pain and inflammation, such as ibuprofen  or naproxen  Opioid medications for pain Phenothiazines, such as chlorpromazine, mesoridazine, prochlorperazine, thioridazine Pioglitazone This list may not describe all possible interactions. Give your health care provider a list  of all the medicines, herbs, non-prescription drugs, or dietary supplements you use. Also tell them if you smoke, drink alcohol, or use illegal drugs. Some items may interact with your medicine. What should I watch for while using this medication? Visit your care team for regular checks on your progress. Tell your care team if your symptoms do not start to get better or if they get worse. Do not suddenly stop taking this medication. You may develop a severe reaction. Your care team will tell you how much medication to take. If your care team wants you to stop the medication, the dose may be slowly lowered over time to avoid any side effects. Wear a medical ID bracelet or chain. Carry a card that describes your condition. List the medications and doses you take on the card. This medication may affect your coordination, reaction time, or judgment. Do not drive or operate machinery until you know how this medication affects you. Sit up or stand slowly to reduce the risk of dizzy or fainting spells. Drinking alcohol with this medication can increase the risk of these side effects. This medication may cause serious skin reactions. They can happen weeks to months after starting the medication. Contact your care team right away if you notice fevers or flu-like symptoms with a rash. The rash may be red or purple and then turn into blisters or peeling of the skin. You may also notice a red rash with swelling of the face, lips, or  lymph nodes in your neck or under your arms. This medication may cause thoughts of suicide or depression. This includes sudden changes in mood, behaviors, or thoughts. These changes can happen at any time but are more common in the beginning of treatment or after a change in dose. Call your care team right away if you experience these thoughts or worsening depression. This medication may slow your child's growth if it is taken for a long time at high doses. Your child's care team will  monitor your child's growth. Using this medication for a long time may weaken your bones. The risk of bone fractures may be increased. Talk to your care team about your bone health. Discuss this medication with your care team if you may be pregnant. Serious birth defects can occur if you take this medication during pregnancy. There are benefits and risks to taking medications during pregnancy. Your care team can help you find the option that works for you. Contraception is recommended while taking this medication. Estrogen and progestin hormones may not work as well while you are taking this medication. Your care team can help you find the option that works for you. Talk to your care team before breastfeeding. Changes to your treatment plan may be needed. What side effects may I notice from receiving this medication? Side effects that you should report to your care team as soon as possible: Allergic reactions--skin rash, itching, hives, swelling of the face, lips, tongue, or throat High acid level--trouble breathing, unusual weakness or fatigue, confusion, headache, fast or irregular heartbeat, nausea, vomiting High ammonia level--unusual weakness or fatigue, confusion, loss of appetite, nausea, vomiting, seizures Fever that does not go away, decrease in sweat Kidney stones--blood in the urine, pain or trouble passing urine, pain in the lower back or sides Redness, blistering, peeling or loosening of the skin, including inside the mouth Sudden eye pain or change in vision such as blurry vision, seeing halos around lights, vision loss Thoughts of suicide or self-harm, worsening mood, feelings of depression Side effects that usually do not require medical attention (report to your care team if they continue or are bothersome): Burning or tingling sensation in hands or feet Difficulty with paying attention, memory, or speech Dizziness Drowsiness Fatigue Loss of appetite with weight loss Slow or  sluggish movements of the body This list may not describe all possible side effects. Call your doctor for medical advice about side effects. You may report side effects to FDA at 1-800-FDA-1088. Where should I keep my medication? Keep out of the reach of children and pets. Store between 15 and 30 degrees C (59 and 86 degrees F). Protect from moisture. Keep the container tightly closed. Get rid of any unused medication after the expiration date. To get rid of medications that are no longer needed or have expired: Take the medication to a medication take-back program. Check with your pharmacy or law enforcement to find a location. If you cannot return the medication, check the label or package insert to see if the medication should be thrown out in the garbage or flushed down the toilet. If you are not sure, ask your care team. If it is safe to put it in the trash, empty the medication out of the container. Mix the medication with cat litter, dirt, coffee grounds, or other unwanted substance. Seal the mixture in a bag or container. Put it in the trash. NOTE: This sheet is a summary. It may not cover all possible information. If you have  questions about this medicine, talk to your doctor, pharmacist, or health care provider.  2024 Elsevier/Gold Standard (2021-12-28 00:00:00)

## 2023-09-03 NOTE — Progress Notes (Signed)
 [facility]    Provider:  Dr Ines Requesting Provider: Cheryle Debby LABOR, MD Primary Care Provider:  Gretta Comer POUR, NP  CC:  Migraines  HPI:  Julia Kline is a 38 y.o. female here as requested by Julia Debby LABOR, MD for Migraines:has Abnormal uterine bleeding; Migraines; Primary hypertension; Type 2 diabetes mellitus with hyperglycemia (HCC); History of pre-eclampsia in prior pregnancy, currently pregnant; Ovalocytosis (HCC); Class 2 severe obesity due to excess calories with serious comorbidity and body mass index (BMI) of 38.0 to 38.9 in adult [facility]); Perirectal abscess; Chest pressure; Secondary amenorrhea; Pituitary adenoma (HCC); Status post transsphenoidal pituitary resection (HCC); and Dyspnea on their problem list.   Patient reports that her Mother had migraines and she has had since a child/teenager. Lately she is having headaches pressure in the forehead, light/sound sensitivity, feels like a band around the head or pulsating/pounding/throbbing, nausea, excedrin doesn;t help anymore, hurts to move, she has a constant headache, more with her menstrual cycles. Getting worse for a long time even before the pituitary surgery. She only has some more light senistivity since the surgery nothing else has really changed. Waking up with headaches, waking up even before she gets out of bed, she snores but not excessively tired and no other symptoms of OSA. Headaches are not worse positionally. No blurry vision or changes in vision. 8 or 9 moderate migraines a month with light sensitivity.   Reviewed notes, labs and imaging from outside physicians, which showed:  Amodipine, amitriptyline, esmolol , metoprolol , valsartan    06/13/2023: MRi brain IMPRESSION: 1. Interval pituitary adenoma resection with enhancing soft tissue in the sella as above, potentially reflecting a combination of residual normal pituitary tissue and postoperative changes. A small amount  of residual tumor is not excluded, and continued follow-up is recommended. 2. Otherwise unremarkable appearance of the brain.     Latest Ref Rng & Units 03/06/2023    3:18 PM 02/26/2023    3:31 PM 10/11/2022    4:05 PM  CBC  WBC 4.0 - 10.5 K/uL 12.0  6.7  8.1   Hemoglobin 12.0 - 15.0 g/dL 89.3  88.8  89.0   Hematocrit 36.0 - 46.0 % 35.1  37.9  34.6   Platelets 150 - 400 K/uL 244  253  267.0       Latest Ref Rng & Units 03/12/2023    8:43 AM 03/07/2023   11:29 AM 03/06/2023    3:18 PM  CMP  Glucose 70 - 99 mg/dL 761  766    BUN 6 - 20 mg/dL 9  7    Creatinine 9.55 - 1.00 mg/dL 9.23  9.29  9.27   Sodium 135 - 145 mmol/L 128  135    Potassium 3.5 - 5.1 mmol/L 3.9  3.6    Chloride 98 - 111 mmol/L 91  103    CO2 22 - 32 mmol/L 27  25    Calcium 8.9 - 10.3 mg/dL 8.4  8.5      Recent Results (from the past 2160 hours)  POCT glycosylated hemoglobin (Hb A1C)     Status: Abnormal   Collection Time: 06/11/23  3:48 PM  Result Value Ref Range   Hemoglobin A1C 6.2 (A) 4.0 - 5.6 %   HbA1c POC (<> result, manual entry)     HbA1c, POC (prediabetic range)     HbA1c, POC (controlled diabetic range)    POCT urine pregnancy     Status: None   Collection Time: 06/11/23  3:49 PM  Result Value Ref Range   Preg Test, Ur Negative Negative  Microalbumin / creatinine urine ratio     Status: None   Collection Time: 06/11/23  4:01 PM  Result Value Ref Range   Microalb, Ur 1.5 0.0 - 1.9 mg/dL   Creatinine,U 12.1 mg/dL   Microalb Creat Ratio 1.8 0.0 - 30.0 mg/g  Cortisol, 3 Specimen     Status: None   Collection Time: 07/17/23  8:28 AM  Result Value Ref Range   Time 830    Specimen 3.7 mcg/dL   Time. 900    Specimen 23.6 mcg/dL   Time 1 069     SPECIMEN 2 30.7 mcg/dL   Other Miscellaneous Test      Comment: Reference Range Premature Infants (31-35 Weeks): < or = 15.0 Term Infants:                    < or = 14.0 . 4 Days-1 Month: Not Established . 2-11 Months:    a.m. 3.0-23.0                  p.m. Not Established. SABRA 1-17 Years:     a.m. 3.0-25.0                 p.m. 3.0-17.0 . Adult 8 a.m. (7-9 a.m.) specimen: 4.0-22.0 Adult 4 p.m. (3-5 PM) specimen:   3.0-17.0 .   *Please interpret above results accordingly.* .     Review of Systems: Patient complains of symptoms per HPI as well as the following symptoms none. Pertinent negatives and positives per HPI. All others negative.   Social History   Socioeconomic History   Marital status: Married    Spouse name: Not on file   Number of children: Not on file   Years of education: Not on file   Highest education level: Master's degree (e.g., MA, MS, MEng, MEd, MSW, MBA)  Occupational History   Not on file  Tobacco Use   Smoking status: Never   Smokeless tobacco: Never  Vaping Use   Vaping status: Never Used  Substance and Sexual Activity   Alcohol use: No   Drug use: No   Sexual activity: Yes    Birth control/protection: None  Other Topics Concern   Not on file  Social History Narrative   Married.   2 children.   Works at [employer].   Social Drivers of Corporate Investment Banker Strain: Low Risk  (12/14/2022)   Overall Financial Resource Strain (CARDIA)    Difficulty of Paying Living Expenses: Not very hard  Food Insecurity: Low Risk  (03/27/2023)   Received from [facility]   Hunger Vital Sign    Worried About Running Out of Food in the Last Year: Never true    Ran Out of Food in the Last Year: Never true  Transportation Needs: No Transportation Needs (03/08/2023)   PRAPARE - Administrator, Civil Service (Medical): No    Lack of Transportation (Non-Medical): No  Physical Activity: Sufficiently Active (12/14/2022)   Exercise Vital Sign    Days of Exercise per Week: 6 days    Minutes of Exercise per Session: 150+ min  Stress: Not on file  Social Connections: Moderately Integrated (12/14/2022)   Social Connection and Isolation Panel [NHANES]    Frequency of Communication with  Friends and Family: More than three times a week    Frequency of Social Gatherings with Friends and Family: Once a week  Attends Religious Services: More than 4 times per year    Active Member of Clubs or Organizations: No    Attends Banker Meetings: Not on file    Marital Status: Married  Intimate Partner Violence: Not At Risk (03/08/2023)   Humiliation, Afraid, Rape, and Kick questionnaire    Fear of Current or Ex-Partner: No    Emotionally Abused: No    Physically Abused: No    Sexually Abused: No    Family History  Problem Relation Age of Onset   Diabetes Mother    Migraines Mother    Diabetes Father    Hypertension Father    Hypertension Maternal Grandmother    Diabetes Maternal Grandmother    Diabetes Maternal Grandfather    COPD Maternal Grandfather    Diabetes Paternal Grandmother    Hypertension Paternal Grandmother    Diabetes Paternal Grandfather    Hypertension Paternal Grandfather    Heart attack Paternal Grandfather    Asthma Sister     Past Medical History:  Diagnosis Date   Acute conjunctivitis of right eye 07/10/2019   Acute pain of right knee 08/06/2019   Anemia 10/14/2019   Complicated UTI (urinary tract infection) 03/10/2021   COVID 2020   had pneumonia   COVID-19    Diabetes mellitus without complication (HCC)    Eczema    Hernia cerebri (HCC)    Hidradenitis axillaris 04/20/2020   Followed by [facility]. Last seen 05/26/2020: instructed to wash b/l with Hibiclens  and take Doxycycline  as prescribed and follow up in one month.    History of COVID-19 12/21/2019   History of gestational diabetes 05/02/2017   Normal pp testing 10/2017   History of severe pre-eclampsia 05/02/2017   Hypertension    Impingement syndrome of left shoulder 02/16/2020   knee   Mastitis 06/18/2019   Migraine    PCOS (polycystic ovarian syndrome)    Pneumonia    Pregnancy induced hypertension    Skin lesions 09/12/2020    Patient Active  Problem List   Diagnosis Date Noted   Dyspnea 05/15/2023   Status post transsphenoidal pituitary resection (HCC) 03/06/2023   Pituitary adenoma (HCC) 12/14/2022   Secondary amenorrhea 11/14/2022   Chest pressure 10/11/2022   Perirectal abscess 06/26/2022   Class 2 severe obesity due to excess calories with serious comorbidity and body mass index (BMI) of 38.0 to 38.9 in adult [facility]) 06/14/2022   Ovalocytosis (HCC) 03/03/2022   History of pre-eclampsia in prior pregnancy, currently pregnant 02/15/2022   Type 2 diabetes mellitus with hyperglycemia (HCC) 12/21/2019   Primary hypertension 10/13/2019   Migraines 04/01/2018   Abnormal uterine bleeding 01/06/2018    Past Surgical History:  Procedure Laterality Date   CESAREAN SECTION  2017   Procedure: CESAREAN DELIVERY ONLY; Surgeon: Kalpen Navin Patel, MD; Location: C-SECTION SUITE [facility]; Service: Obstetrics   CESAREAN SECTION N/A 10/02/2017   Procedure: CESAREAN SECTION;  Surgeon: Edsel Norleen GAILS, MD;  Location: [facility];  Service: Obstetrics;  Laterality: N/A;   CRANIOTOMY N/A 03/06/2023   Procedure: endoscopic endonasal resection of pituitary tumor;  Surgeon: Julia Debby LABOR, MD;  Location: [facility] OR;  Service: Neurosurgery;  Laterality: N/A;   DILATION AND EVACUATION N/A 03/06/2022   Procedure: DILATATION AND EVACUATION;  Surgeon: Lorence Ozell CROME, MD;  Location: [facility] OR;  Service: Gynecology;  Laterality: N/A;   I & D EXTREMITY Left 10/14/2020   Procedure: Irrigation and debridement left small and ring finger with repair of tendon;  Surgeon:  Camella Fallow, MD;  Location: [facility] OR;  Service: Orthopedics;  Laterality: Left;  1 hr Block with IV Sed   LAPAROSCOPIC OOPHERECTOMY Left    LEEP     OVARIAN CYST REMOVAL     TONSILLECTOMY     As a child   TRANSPHENOIDAL APPROACH EXPOSURE N/A 03/06/2023   Procedure: TRANSPHENOIDAL APPROACH EXPOSURE;  Surgeon: Llewellyn Gerard LABOR, DO;  Location: [facility] OR;  Service: ENT;  Laterality: N/A;    WISDOM TOOTH EXTRACTION      Current Outpatient Medications  Medication Sig Dispense Refill   Acetaminophen  (TYLENOL  PO) Take by mouth as needed.     amLODipine  (NORVASC ) 2.5 MG tablet Take 1 tablet (2.5 mg total) by mouth daily. for blood pressure. 90 tablet 0   rizatriptan  (MAXALT -MLT) 10 MG disintegrating tablet Take 1 tablet (10 mg total) by mouth as needed for migraine. May repeat in 2 hours if needed. Maximum 2 in one day 9 tablet 11   tirzepatide  (MOUNJARO ) 12.5 MG/0.5ML Pen Inject 12.5 mg into the skin once a week. for diabetes. 6 mL 0   topiramate  (TOPAMAX ) 50 MG tablet Start on one pill at bedtime(50mg ). Then in 1 week increase to 2 pills at bedtime (100mg ). 60 tablet 6   valsartan  (DIOVAN ) 160 MG tablet TAKE 1 TABLET BY MOUTH DAILY FOR BLOOD PRESSURE 90 tablet 2   No current facility-administered medications for this visit.    Allergies as of 09/03/2023 - Review Complete 09/03/2023  Allergen Reaction Noted   Ancef  [cefazolin ] Hives and Swelling 10/03/2017   Fentanyl  Hives and Swelling 10/03/2017   Shrimp [shellfish allergy] Swelling and Other (See Comments) 08/22/2018   Chlorhexidine  Hives, Itching, and Rash 03/06/2022   Morphine and codeine Rash 07/23/2014   Pseudoephedrine hcl Other (See Comments)     Vitals: BP (!) 133/97 (Cuff Size: Large)   Pulse 88   Ht 5' 9 (1.753 m)   Wt 225 lb (102.1 kg)   BMI 33.23 kg/m  Last Weight:  Wt Readings from Last 1 Encounters:  09/03/23 225 lb (102.1 kg)   Last Height:   Ht Readings from Last 1 Encounters:  09/03/23 5' 9 (1.753 m)     Physical exam: Exam: Gen: NAD, conversant, well nourised, obese, well groomed                     CV: RRR, no MRG. No Carotid Bruits. No peripheral edema, warm, nontender Eyes: Conjunctivae clear without exudates or hemorrhage  Neuro: Detailed Neurologic Exam  Speech:    Speech is normal; fluent and spontaneous with normal comprehension.  Cognition:    The patient is oriented to  person, place, and time;     recent and remote memory intact;     language fluent;     normal attention, concentration,     fund of knowledge Cranial Nerves:    The pupils are equal, round, and reactive to light. The fundi are normal and spontaneous venous pulsations are present. Visual fields are full to finger confrontation. Extraocular movements are intact. Trigeminal sensation is intact and the muscles of mastication are normal. The face is symmetric. The palate elevates in the midline. Hearing intact. Voice is normal. Shoulder shrug is normal. The tongue has normal motion without fasciculations.   Coordination: nml  Gait: nml  Motor Observation:    No asymmetry, no atrophy, and no involuntary movements noted. Tone:    Normal muscle tone.    Posture:    Posture is normal.  normal erect    Strength:    Strength is V/V in the upper and lower limbs.      Sensation: intact to LT     Reflex Exam:  DTR's:    Deep tendon reflexes in the upper and lower extremities are normal bilaterally.   Toes:    The toes are downgoing bilaterally.   Clonus:    Clonus is absent.    Assessment/Plan:  Patient with chronic migraines. Worsening since pituitary adenoma surgery however she has had migraines for years and has a family history, repeat MRI brain did not show an etiology for her worsening headaches.Neuro exam normal.  Discussed options in detail with patient.   Preventative: Topiramate  is  traditional migraine preventative. If you fail/have side effects to the topiramate  would tryThe new medications include Ajovy, Emgality, Aimovig, Qulipta  Acute management: Rizatriptan  : Please take one tablet at the onset of your headache. If it does not improve the symptoms please take one additional tablet. Do not take more then 2 tablets in 24hrs. Do not take use more then 2 to 3 times in a week. If you have side effects to this try anohter triptan (such as eletriptan) new medications Nurtec,  Ubrelvy.   Meds ordered this encounter  Medications   rizatriptan  (MAXALT -MLT) 10 MG disintegrating tablet    Sig: Take 1 tablet (10 mg total) by mouth as needed for migraine. May repeat in 2 hours if needed. Maximum 2 in one day    Dispense:  9 tablet    Refill:  11   topiramate  (TOPAMAX ) 50 MG tablet    Sig: Start on one pill at bedtime(50mg ). Then in 1 week increase to 2 pills at bedtime (100mg ).    Dispense:  60 tablet    Refill:  6   Discussed: To prevent or relieve headaches, try the following: Cool Compress. Lie down and place a cool compress on your head.  Avoid headache triggers. If certain foods or odors seem to have triggered your migraines in the past, avoid them. A headache diary might help you identify triggers.  Include physical activity in your daily routine. Try a daily walk or other moderate aerobic exercise.  Manage stress. Find healthy ways to cope with the stressors, such as delegating tasks on your to-do list.  Practice relaxation techniques. Try deep breathing, yoga, massage and visualization.  Eat regularly. Eating regularly scheduled meals and maintaining a healthy diet might help prevent headaches. Also, drink plenty of fluids.  Follow a regular sleep schedule. Sleep deprivation might contribute to headaches Consider biofeedback. With this mind-body technique, you learn to control certain bodily functions -- such as muscle tension, heart rate and blood pressure -- to prevent headaches or reduce headache pain.    Proceed to emergency room if you experience new or worsening symptoms or symptoms do not resolve, if you have new neurologic symptoms or if headache is severe, or for any concerning symptom.   Provided education and documentation from American headache Society toolbox including articles on: chronic migraine medication overuse headache, chronic migraines, prevention of migraines, behavioral and other nonpharmacologic treatments for headache.   Cc:  Julia Debby LABOR, MD,  Julia Comer POUR, NP  Onetha Epp, MD  [facility] [address] [place], [place] [address]  Phone [phone] Fax [phone]

## 2023-09-05 ENCOUNTER — Encounter: Payer: Self-pay | Admitting: Neurology

## 2023-09-05 NOTE — Addendum Note (Signed)
Addended by: Naomie Dean B on: 09/05/2023 10:27 PM   Modules accepted: Level of Service

## 2023-10-15 ENCOUNTER — Other Ambulatory Visit: Payer: Self-pay | Admitting: Primary Care

## 2023-10-15 DIAGNOSIS — E1165 Type 2 diabetes mellitus with hyperglycemia: Secondary | ICD-10-CM

## 2023-10-15 MED ORDER — MOUNJARO 12.5 MG/0.5ML ~~LOC~~ SOAJ
12.5000 mg | SUBCUTANEOUS | 0 refills | Status: DC
Start: 2023-10-15 — End: 2023-12-29

## 2023-11-06 ENCOUNTER — Ambulatory Visit (INDEPENDENT_AMBULATORY_CARE_PROVIDER_SITE_OTHER): Admitting: Primary Care

## 2023-11-06 ENCOUNTER — Encounter: Payer: Self-pay | Admitting: Primary Care

## 2023-11-06 VITALS — BP 156/94 | HR 75 | Temp 97.1°F | Ht 69.0 in | Wt 204.0 lb

## 2023-11-06 DIAGNOSIS — L732 Hidradenitis suppurativa: Secondary | ICD-10-CM

## 2023-11-06 DIAGNOSIS — I10 Essential (primary) hypertension: Secondary | ICD-10-CM

## 2023-11-06 MED ORDER — VALSARTAN 160 MG PO TABS
160.0000 mg | ORAL_TABLET | Freq: Every day | ORAL | 0 refills | Status: DC
Start: 2023-11-06 — End: 2024-02-02

## 2023-11-06 MED ORDER — CLINDAMYCIN PHOSPHATE 1 % EX LOTN: TOPICAL_LOTION | Freq: Two times a day (BID) | CUTANEOUS | 0 refills | Status: DC | PRN

## 2023-11-06 MED ORDER — AMLODIPINE BESYLATE 2.5 MG PO TABS
2.5000 mg | ORAL_TABLET | Freq: Every day | ORAL | 0 refills | Status: DC
Start: 2023-11-06 — End: 2024-02-02

## 2023-11-06 NOTE — Assessment & Plan Note (Addendum)
 Uncontrolled with BP 156/94 Has been out of meds x2 weeks  Refilled amlodipine 2.5mg  daily and valsartan 160mg  daily   Advised to check BP at home daily for 7-10 days after resuming meds  I evaluated patient, was consulted regarding treatment, and agree with assessment and plan per Julaine Fusi, MSN, FNP student.   Mayra Reel, NP-C

## 2023-11-06 NOTE — Progress Notes (Signed)
 Subjective:    Patient ID: Julia Kline, female    DOB: 1985-12-25, 38 y.o.   MRN: [id number]  HPI  Julia Kline is a very pleasant 38 y.o. female with a history of type 2 diabetes, hemorrhoids, perirectal abscess who presents today to discuss axillary drainage/cyst.  Symptom onset 1-2 week ago with a lump to the left axillary without erythema, pain, warmth, or drainage. Yesterday she noticed spontaneous drainage of cream colored, foul smelling discharge.   She denies fevers, chills, increased axillary swelling, use of Deoderant due to prior axillary irritation, pain. She's not applied anything topically to her axilla.    She has a prior history of "boils" to the axilla, abdomen, and rectal region. She has been out of her amlodipine and valsartan for 2 weeks.   BP Readings from Last 3 Encounters:  11/06/23 (!) 156/94  09/03/23 (!) 133/97  06/11/23 132/80     Review of Systems  Constitutional:  Negative for chills and fever.  Skin:  Positive for color change and wound.         Past Medical History:  Diagnosis Date   Acute conjunctivitis of right eye 07/10/2019   Acute pain of right knee 08/06/2019   Anemia 10/14/2019   Complicated UTI (urinary tract infection) 03/10/2021   COVID 2020   had pneumonia   COVID-19    Diabetes mellitus without complication (HCC)    Eczema    Hernia cerebri (HCC)    Hidradenitis axillaris 04/20/2020   Followed by [facility]. Last seen 05/26/2020: instructed to wash b/l with Hibiclens and take Doxycycline as prescribed and follow up in one month.    History of COVID-19 12/21/2019   History of gestational diabetes 05/02/2017   Normal pp testing 10/2017   History of severe pre-eclampsia 05/02/2017   Hypertension    Impingement syndrome of left shoulder 02/16/2020   knee   Mastitis 06/18/2019   Migraine    PCOS (polycystic ovarian syndrome)    Pneumonia    Pregnancy induced hypertension    Skin lesions  09/12/2020    Social History   Socioeconomic History   Marital status: Married    Spouse name: Not on file   Number of children: Not on file   Years of education: Not on file   Highest education level: Master's degree (e.g., MA, MS, MEng, MEd, MSW, MBA)  Occupational History   Not on file  Tobacco Use   Smoking status: Never   Smokeless tobacco: Never  Vaping Use   Vaping status: Never Used  Substance and Sexual Activity   Alcohol use: No   Drug use: No   Sexual activity: Yes    Birth control/protection: None  Other Topics Concern   Not on file  Social History Narrative   Married.   2 children.   Works at [employer].   Social Drivers of Corporate investment banker Strain: Low Risk  (12/14/2022)   Overall Financial Resource Strain (CARDIA)    Difficulty of Paying Living Expenses: Not very hard  Food Insecurity: Low Risk  (03/27/2023)   Received from [facility]   Hunger Vital Sign    Worried About Running Out of Food in the Last Year: Never true    Ran Out of Food in the Last Year: Never true  Transportation Needs: No Transportation Needs (03/08/2023)   PRAPARE - Administrator, Civil Service (Medical): No    Lack of Transportation (Non-Medical): No  Physical Activity: Sufficiently Active (12/14/2022)   Exercise Vital Sign    Days of Exercise per Week: 6 days    Minutes of Exercise per Session: 150+ min  Stress: Not on file  Social Connections: Moderately Integrated (12/14/2022)   Social Connection and Isolation Panel [NHANES]    Frequency of Communication with Friends and Family: More than three times a week    Frequency of Social Gatherings with Friends and Family: Once a week    Attends Religious Services: More than 4 times per year    Active Member of [employer] or Organizations: No    Attends Banker Meetings: Not on file    Marital Status: Married  Intimate Partner Violence: Not At Risk (03/08/2023)   Humiliation, Afraid, Rape, and  Kick questionnaire    Fear of Current or Ex-Partner: No    Emotionally Abused: No    Physically Abused: No    Sexually Abused: No    Past Surgical History:  Procedure Laterality Date   CESAREAN SECTION  2017   Procedure: CESAREAN DELIVERY ONLY; Surgeon: Marian Sorrow, MD; Location: [facility]; Service: Obstetrics   CESAREAN SECTION N/A 10/02/2017   Procedure: CESAREAN SECTION;  Surgeon: Tilda Burrow, MD;  Location: [facility];  Service: Obstetrics;  Laterality: N/A;   CRANIOTOMY N/A 03/06/2023   Procedure: endoscopic endonasal resection of pituitary tumor;  Surgeon: Jadene Pierini, MD;  Location: [facility] OR;  Service: Neurosurgery;  Laterality: N/A;   DILATION AND EVACUATION N/A 03/06/2022   Procedure: DILATATION AND EVACUATION;  Surgeon: Hermina Staggers, MD;  Location: [facility] OR;  Service: Gynecology;  Laterality: N/A;   I & D EXTREMITY Left 10/14/2020   Procedure: Irrigation and debridement left small and ring finger with repair of tendon;  Surgeon: Dominica Severin, MD;  Location: [facility] OR;  Service: Orthopedics;  Laterality: Left;  1 hr Block with IV Sed   LAPAROSCOPIC OOPHERECTOMY Left    LEEP     OVARIAN CYST REMOVAL     TONSILLECTOMY     As a child   TRANSPHENOIDAL APPROACH EXPOSURE N/A 03/06/2023   Procedure: TRANSPHENOIDAL APPROACH EXPOSURE;  Surgeon: Laren Boom, DO;  Location: [facility] OR;  Service: ENT;  Laterality: N/A;   WISDOM TOOTH EXTRACTION      Family History  Problem Relation Age of Onset   Diabetes Mother    Migraines Mother    Diabetes Father    Hypertension Father    Hypertension Maternal Grandmother    Diabetes Maternal Grandmother    Diabetes Maternal Grandfather    COPD Maternal Grandfather    Diabetes Paternal Grandmother    Hypertension Paternal Grandmother    Diabetes Paternal Grandfather    Hypertension Paternal Grandfather    Heart attack Paternal Grandfather    Asthma Sister     Allergies  Allergen Reactions   Ancef  [Cefazolin] Hives and Swelling    Facial and neck swelling; Had reaction after receiving fentanyl and ancef in the OR   Fentanyl Hives and Swelling    Facial and neck swelling; Had reaction after receiving fentanyl and ancef in the OR   Shrimp [Shellfish Allergy] Swelling and Other (See Comments)    Swelling of throat   Chlorhexidine Hives, Itching and Rash   Morphine And Codeine Rash   Pseudoephedrine Hcl Other (See Comments)    Light headed/ dizzy    Current Outpatient Medications on File Prior to Visit  Medication Sig Dispense Refill   Acetaminophen (TYLENOL PO)  Take by mouth as needed.     rizatriptan (MAXALT-MLT) 10 MG disintegrating tablet Take 1 tablet (10 mg total) by mouth as needed for migraine. May repeat in 2 hours if needed. Maximum 2 in one day 9 tablet 11   tirzepatide (MOUNJARO) 12.5 MG/0.5ML Pen Inject 12.5 mg into the skin once a week. for diabetes. 6 mL 0   topiramate (TOPAMAX) 50 MG tablet Start on one pill at bedtime(50mg ). Then in 1 week increase to 2 pills at bedtime (100mg ). 60 tablet 6   No current facility-administered medications on file prior to visit.    BP (!) 156/94   Pulse 75   Temp (!) 97.1 F (36.2 C) (Temporal)   Ht 5\' 9"  (1.753 m)   Wt 204 lb (92.5 kg)   LMP 10/19/2023   SpO2 100%   BMI 30.13 kg/m  Objective:   Physical Exam Cardiovascular:     Rate and Rhythm: Normal rate.  Pulmonary:     Effort: Pulmonary effort is normal.  Musculoskeletal:     Cervical back: Neck supple.  Skin:    General: Skin is warm and dry.     Comments: Deep tunneled scaring to bilateral axilla without drainage, erythema, tenderness, or warmth.   Neurological:     Mental Status: She is alert and oriented to person, place, and time.  Psychiatric:        Mood and Affect: Mood normal.           Assessment & Plan:  Hidradenitis suppurativa Assessment & Plan: Chronic issue with recent flare that is resolving  Discussed treatment and management options  of conservative measures of warm compresses, hygiene; pharmacologic treatment with topical antimicrobials (clindamycin); surgical interventions.   Prescribed clindamycin 1% lotion - apply BID prn to affected area  Will consider surgical consult if patient preference   Follow up as needed, based on symptoms  I evaluated patient, was consulted regarding treatment, and agree with assessment and plan per Julaine Fusi, MSN, FNP student.   Mayra Reel, NP-C   Orders: -     Clindamycin Phosphate; Apply topically 2 (two) times daily as needed. For hidradenitis suppurativa  Dispense: 60 mL; Refill: 0  Primary hypertension Assessment & Plan: Uncontrolled with BP 156/94 Has been out of meds x2 weeks  Refilled amlodipine 2.5mg  daily and valsartan 160mg  daily   Advised to check BP at home daily for 7-10 days after resuming meds  I evaluated patient, was consulted regarding treatment, and agree with assessment and plan per Julaine Fusi, MSN, FNP student.   Mayra Reel, NP-C   Orders: -     amLODIPine Besylate; Take 1 tablet (2.5 mg total) by mouth daily. for blood pressure.  Dispense: 90 tablet; Refill: 0 -     Valsartan; Take 1 tablet (160 mg total) by mouth daily. for blood pressure  Dispense: 90 tablet; Refill: 0        Doreene Nest, NP

## 2023-11-06 NOTE — Assessment & Plan Note (Addendum)
 Chronic issue with recent flare that is resolving  Discussed treatment and management options of conservative measures of warm compresses, hygiene; pharmacologic treatment with topical antimicrobials (clindamycin); surgical interventions.   Prescribed clindamycin 1% lotion - apply BID prn to affected area  Will consider surgical consult if patient preference   Follow up as needed, based on symptoms  I evaluated patient, was consulted regarding treatment, and agree with assessment and plan per Julaine Fusi, MSN, FNP student.   Mayra Reel, NP-C

## 2023-11-06 NOTE — Progress Notes (Signed)
 Acute Office Visit  Subjective:     Patient ID: Julia Kline, female    DOB: Jun 17, 1986, 38 y.o.   MRN: [id number]  Chief Complaint  Patient presents with   Cyst    Cyst under left arm that recently started draining greenish color with a bad odor.      HPI Cataleia is a 38 year old female with history of type 2 diabetes mellitus, obesity, pituitary adenoma, and perirectal abscess who presents today to discuss left axillary cyst. She reports cyst has been present for 1-2 weeks and started as a lump. There is no warmth or erythema. Yesterday, the cyst started draining spontaneously, with green, foul-smelling discharge. She had minor pain to area preceding the drainage and the pain has since resolved. Denies fevers, chills, nausea, vomiting, diaphoresis, itching. At home remedies include warm compresses. No changes to medications. No deodorant use due to history of skin irritation. She reports a history of similar symptoms, the last in axillary region was >2 years ago.   She also reports concern of hemorrhoid v. boil in perirectal region. She reports having bleeding hemorrhoids last month, since resolved, and is using daily stool softeners with relief.   She does not monitor home blood sugars.   She has been out of antihypertensives x2 weeks.  Review of Systems  Constitutional:  Negative for chills, diaphoresis, fever and malaise/fatigue.  Gastrointestinal:  Negative for blood in stool, constipation, nausea and vomiting.  Skin:  Negative for itching and rash.       Positive for cyst in left axilla       Objective:    BP (!) 156/94   Pulse 75   Temp (!) 97.1 F (36.2 C) (Temporal)   Ht 5\' 9"  (1.753 m)   Wt 92.5 kg   LMP 10/19/2023   SpO2 100%   BMI 30.13 kg/m    Physical Exam Vitals reviewed. Exam conducted with a chaperone present.  Constitutional:      Appearance: Normal appearance.  Cardiovascular:     Rate and Rhythm: Normal rate and regular rhythm.   Pulmonary:     Effort: Pulmonary effort is normal.     Breath sounds: Normal breath sounds.  Genitourinary:    Rectum: Normal.     Comments: External hemorrhoids present without erythema, tenderness or bleeding  Skin:    General: Skin is warm and dry.     Comments: Deep tunneled scarring to bilateral axillae without erythema, warmth, swelling, or drainage.   Neurological:     Mental Status: She is alert and oriented to person, place, and time.  Psychiatric:        Mood and Affect: Mood normal.        Behavior: Behavior normal.     No results found for any visits on 11/06/23.      Assessment & Plan:   Problem List Items Addressed This Visit       Cardiovascular and Mediastinum   Primary hypertension   Uncontrolled with BP 156/94 Has been out of meds x2 weeks  Refilled amlodipine 2.5mg  daily and valsartan 160mg  daily   Advised to check BP at home daily for 7-10 days after resuming meds      Relevant Medications   amLODipine (NORVASC) 2.5 MG tablet   valsartan (DIOVAN) 160 MG tablet     Musculoskeletal and Integument   Hidradenitis suppurativa - Primary   Chronic issue with recent flare that is resolving  Discussed treatment and management options of conservative measures of  warm compresses, hygiene; pharmacologic treatment with topical antimicrobials (clindamycin); surgical interventions.   Prescribed clindamycin 1% lotion - apply BID prn   Will consider surgical consult if patient preference   Follow up as needed, based on symptoms      Relevant Medications   clindamycin (CLEOCIN T) 1 % lotion    Meds ordered this encounter  Medications   amLODipine (NORVASC) 2.5 MG tablet    Sig: Take 1 tablet (2.5 mg total) by mouth daily. for blood pressure.    Dispense:  90 tablet    Refill:  0    Supervising Provider:   BEDSOLE, AMY E [[id number]]   valsartan (DIOVAN) 160 MG tablet    Sig: Take 1 tablet (160 mg total) by mouth daily. for blood pressure    Dispense:   90 tablet    Refill:  0    Supervising Provider:   BEDSOLE, AMY E [[id number]]   clindamycin (CLEOCIN T) 1 % lotion    Sig: Apply topically 2 (two) times daily as needed. For hidradenitis suppurativa    Dispense:  60 mL    Refill:  0    Follow up as needed or as scheduled for routine appointments  Lindell Spar, RN

## 2023-11-06 NOTE — Patient Instructions (Signed)
 Follow up in April for physical   BP meds have been refilled to [facility]  Topical clindamycin has been prescribed to use as needed  It was a pleasure seeing you today.

## 2023-12-10 ENCOUNTER — Encounter: Payer: 59 | Admitting: Primary Care

## 2023-12-12 ENCOUNTER — Telehealth: Payer: Self-pay | Admitting: Primary Care

## 2023-12-12 NOTE — Telephone Encounter (Signed)
 Can we find out the reason for the request for motion sickness patch?

## 2023-12-12 NOTE — Telephone Encounter (Signed)
 Copied from CRM [id number]. Topic: Clinical - Medication Refill >> Dec 12, 2023  3:59 PM Luane Rumps D wrote: Most Recent Primary Care Visit:  Provider: CLARK, KATHERINE K  Department: [facility]  Visit Type: OFFICE VISIT  Date: 11/06/2023  Medication: Motion Sickness Patch  Has the patient contacted their pharmacy? No (Agent: If no, request that the patient contact the pharmacy for the refill. If patient does not wish to contact the pharmacy document the reason why and proceed with request.) (Agent: If yes, when and what did the pharmacy advise?)  Is this the correct pharmacy for this prescription? Yes If no, delete pharmacy and type the correct one.  This is the patient's preferred pharmacy:  [id number] - [place], [place] - [address] AT [facility] OF [address] & [address] [place] [address] Phone: [phone] Fax: [phone]  Has the prescription been filled recently? No  Is the patient out of the medication? Yes  Has the patient been seen for an appointment in the last year OR does the patient have an upcoming appointment? Yes  Can we respond through MyChart? Yes  Agent: Please be advised that Rx refills may take up to 3 business days. We ask that you follow-up with your pharmacy.

## 2023-12-13 NOTE — Telephone Encounter (Addendum)
 Mail box full not able to leave message at this time. Sending my chart message to patient as well.

## 2023-12-18 NOTE — Telephone Encounter (Signed)
 Spoke to patient she no longer needs she has follow up soon with PCP.  No further action needed at this time.

## 2023-12-24 ENCOUNTER — Encounter: Payer: Self-pay | Admitting: Primary Care

## 2023-12-24 ENCOUNTER — Ambulatory Visit (INDEPENDENT_AMBULATORY_CARE_PROVIDER_SITE_OTHER): Admitting: Primary Care

## 2023-12-24 VITALS — BP 122/76 | HR 90 | Temp 97.9°F | Ht 69.0 in | Wt 189.0 lb

## 2023-12-24 DIAGNOSIS — E663 Overweight: Secondary | ICD-10-CM

## 2023-12-24 DIAGNOSIS — E65 Localized adiposity: Secondary | ICD-10-CM

## 2023-12-24 DIAGNOSIS — Z0000 Encounter for general adult medical examination without abnormal findings: Secondary | ICD-10-CM | POA: Diagnosis not present

## 2023-12-24 DIAGNOSIS — G43009 Migraine without aura, not intractable, without status migrainosus: Secondary | ICD-10-CM

## 2023-12-24 DIAGNOSIS — E893 Postprocedural hypopituitarism: Secondary | ICD-10-CM

## 2023-12-24 DIAGNOSIS — L732 Hidradenitis suppurativa: Secondary | ICD-10-CM

## 2023-12-24 DIAGNOSIS — E1165 Type 2 diabetes mellitus with hyperglycemia: Secondary | ICD-10-CM

## 2023-12-24 DIAGNOSIS — I10 Essential (primary) hypertension: Secondary | ICD-10-CM | POA: Diagnosis not present

## 2023-12-24 DIAGNOSIS — Z7985 Long-term (current) use of injectable non-insulin antidiabetic drugs: Secondary | ICD-10-CM

## 2023-12-24 DIAGNOSIS — Z6827 Body mass index (BMI) 27.0-27.9, adult: Secondary | ICD-10-CM

## 2023-12-24 DIAGNOSIS — Z0001 Encounter for general adult medical examination with abnormal findings: Secondary | ICD-10-CM

## 2023-12-24 NOTE — Assessment & Plan Note (Signed)
 Improved but with itching.  Continue clindamycin  1% lotion BID.  Add hydrocortisone  OTC.  She will update.

## 2023-12-24 NOTE — Assessment & Plan Note (Signed)
 Repeat A1c pending.  Commended her on weight loss! Continue Mounjaro  12.5 mg weekly.  Follow-up in 6 months.

## 2023-12-24 NOTE — Assessment & Plan Note (Signed)
 Controlled.  Continue valsartan  160 mg daily, amlodipine  2.5 mg daily. CMP pending.

## 2023-12-24 NOTE — Assessment & Plan Note (Signed)
 Stable.  Continue Topamax  100 mg daily. Maxalt  10 mg PRN.  Following with neurology.

## 2023-12-24 NOTE — Assessment & Plan Note (Signed)
 Immunizations UTD. Declines pneumonia vaccine. Pap smear UTD.  Discussed the importance of a healthy diet and regular exercise in order for weight loss, and to reduce the risk of further co-morbidity.  Exam stable. Labs pending.  Follow up in 1 year for repeat physical.

## 2023-12-24 NOTE — Patient Instructions (Signed)
 Stop by the lab prior to leaving today. I will notify you of your results once received.   You will either be contacted via phone regarding your referral to plastic surgery, or you may receive a letter on your MyChart portal from our referral team with instructions for scheduling an appointment. Please let us  know if you have not been contacted by anyone within two weeks.  Please schedule a follow up visit for 6 months for a diabetes check.  It was a pleasure to see you today!

## 2023-12-24 NOTE — Assessment & Plan Note (Signed)
 Following with endocrinology

## 2023-12-24 NOTE — Assessment & Plan Note (Signed)
 Commended her on weight loss!  Increase exercise. Continue to work on diet.

## 2023-12-24 NOTE — Assessment & Plan Note (Signed)
 Based on exam today, it does not appear that abdominal exercise will resolve the issue. We did discuss to increase cardio exercise in general.  Discussed options, we agree for plastic surgery consultation.  Referral placed.

## 2023-12-24 NOTE — Progress Notes (Signed)
 Subjective:    Patient ID: Julia Kline, female    DOB: 1986/04/11, 38 y.o.   MRN: [id number]  HPI  Julia Kline is a very pleasant 38 y.o. female who presents today for complete physical and follow up of chronic conditions.  She would also like to discuss her large pannus. She has lost nearly 70 pounds since April 2024 from Mounjaro . She feels great about her weight loss but has excessive skin hanging from her abdomen. She is self-conscious about wearing a bathing suit, especially given the summer months ahead. She questions if there is anything that can be done with her excess skin.  Immunizations: -Tetanus: Completed in 2018 -Pneumonia: Never completed, declines   Diet: Fair diet.  Exercise: Some walking   Eye exam: Completes annually  Dental exam: Completes semi-annually    Pap Smear: Completed in 2023  BP Readings from Last 3 Encounters:  12/24/23 122/76  11/06/23 (!) 156/94  09/03/23 (!) 133/97    Wt Readings from Last 3 Encounters:  12/24/23 189 lb (85.7 kg)  11/06/23 204 lb (92.5 kg)  09/03/23 225 lb (102.1 kg)        Review of Systems  Constitutional:  Negative for unexpected weight change.  HENT:  Negative for rhinorrhea.   Respiratory:  Negative for cough and shortness of breath.   Cardiovascular:  Negative for chest pain.  Gastrointestinal:  Negative for constipation and diarrhea.  Genitourinary:  Negative for difficulty urinating.  Musculoskeletal:  Negative for arthralgias and myalgias.  Skin:  Negative for rash.       Itching to left axilla  Allergic/Immunologic: Negative for environmental allergies.  Neurological:  Negative for dizziness, numbness and headaches.  Psychiatric/Behavioral:  The patient is not nervous/anxious.          Past Medical History:  Diagnosis Date   Acute conjunctivitis of right eye 07/10/2019   Acute pain of right knee 08/06/2019   Anemia 10/14/2019   Complicated UTI (urinary tract infection)  03/10/2021   COVID 2020   had pneumonia   COVID-19    Diabetes mellitus without complication (HCC)    Eczema    Hernia cerebri (HCC)    Hidradenitis axillaris 04/20/2020   Followed by [facility]. Last seen 05/26/2020: instructed to wash b/l with Hibiclens  and take Doxycycline  as prescribed and follow up in one month.    History of COVID-19 12/21/2019   History of gestational diabetes 05/02/2017   Normal pp testing 10/2017   History of severe pre-eclampsia 05/02/2017   Hypertension    Impingement syndrome of left shoulder 02/16/2020   knee   Mastitis 06/18/2019   Migraine    PCOS (polycystic ovarian syndrome)    Pneumonia    Pregnancy induced hypertension    Skin lesions 09/12/2020    Social History   Socioeconomic History   Marital status: Married    Spouse name: Not on file   Number of children: Not on file   Years of education: Not on file   Highest education level: Master's degree (e.g., MA, MS, MEng, MEd, MSW, MBA)  Occupational History   Not on file  Tobacco Use   Smoking status: Never   Smokeless tobacco: Never  Vaping Use   Vaping status: Never Used  Substance and Sexual Activity   Alcohol use: No   Drug use: No   Sexual activity: Yes    Birth control/protection: None  Other Topics Concern   Not on file  Social History Narrative  Married.   2 children.   Works at [employer].   Social Drivers of Corporate investment banker Strain: Low Risk  (12/14/2022)   Overall Financial Resource Strain (CARDIA)    Difficulty of Paying Living Expenses: Not very hard  Food Insecurity: Low Risk  (03/27/2023)   Received from [facility]   Hunger Vital Sign    Worried About Running Out of Food in the Last Year: Never true    Ran Out of Food in the Last Year: Never true  Transportation Needs: No Transportation Needs (03/08/2023)   PRAPARE - Administrator, Civil Service (Medical): No    Lack of Transportation (Non-Medical): No   Physical Activity: Sufficiently Active (12/14/2022)   Exercise Vital Sign    Days of Exercise per Week: 6 days    Minutes of Exercise per Session: 150+ min  Stress: Not on file  Social Connections: Moderately Integrated (12/14/2022)   Social Connection and Isolation Panel [NHANES]    Frequency of Communication with Friends and Family: More than three times a week    Frequency of Social Gatherings with Friends and Family: Once a week    Attends Religious Services: More than 4 times per year    Active Member of [employer] or Organizations: No    Attends Banker Meetings: Not on file    Marital Status: Married  Intimate Partner Violence: Not At Risk (03/08/2023)   Humiliation, Afraid, Rape, and Kick questionnaire    Fear of Current or Ex-Partner: No    Emotionally Abused: No    Physically Abused: No    Sexually Abused: No    Past Surgical History:  Procedure Laterality Date   CESAREAN SECTION  2017   Procedure: CESAREAN DELIVERY ONLY; Surgeon: Kalpen Navin Patel, MD; Location: C-SECTION SUITE [facility]; Service: Obstetrics   CESAREAN SECTION N/A 10/02/2017   Procedure: CESAREAN SECTION;  Surgeon: Albino Hum, MD;  Location: [facility] BIRTHING SUITES;  Service: Obstetrics;  Laterality: N/A;   CRANIOTOMY N/A 03/06/2023   Procedure: endoscopic endonasal resection of pituitary tumor;  Surgeon: Cannon Champion, MD;  Location: [facility] OR;  Service: Neurosurgery;  Laterality: N/A;   DILATION AND EVACUATION N/A 03/06/2022   Procedure: DILATATION AND EVACUATION;  Surgeon: Othelia Blinks, MD;  Location: [facility] OR;  Service: Gynecology;  Laterality: N/A;   I & D EXTREMITY Left 10/14/2020   Procedure: Irrigation and debridement left small and ring finger with repair of tendon;  Surgeon: Ronn Cohn, MD;  Location: [facility] OR;  Service: Orthopedics;  Laterality: Left;  1 hr Block with IV Sed   LAPAROSCOPIC OOPHERECTOMY Left    LEEP     OVARIAN CYST REMOVAL     TONSILLECTOMY     As a child    TRANSPHENOIDAL APPROACH EXPOSURE N/A 03/06/2023   Procedure: TRANSPHENOIDAL APPROACH EXPOSURE;  Surgeon: Daleen Dubs, DO;  Location: [facility] OR;  Service: ENT;  Laterality: N/A;   WISDOM TOOTH EXTRACTION      Family History  Problem Relation Age of Onset   Diabetes Mother    Migraines Mother    Diabetes Father    Hypertension Father    Hypertension Maternal Grandmother    Diabetes Maternal Grandmother    Diabetes Maternal Grandfather    COPD Maternal Grandfather    Diabetes Paternal Grandmother    Hypertension Paternal Grandmother    Diabetes Paternal Grandfather    Hypertension Paternal Grandfather    Heart attack Paternal Grandfather  Asthma Sister     Allergies  Allergen Reactions   Ancef  [Cefazolin ] Hives and Swelling    Facial and neck swelling; Had reaction after receiving fentanyl  and ancef  in the OR   Fentanyl  Hives and Swelling    Facial and neck swelling; Had reaction after receiving fentanyl  and ancef  in the OR   Shrimp [Shellfish Allergy] Swelling and Other (See Comments)    Swelling of throat   Chlorhexidine  Hives, Itching and Rash   Morphine And Codeine Rash   Pseudoephedrine Hcl Other (See Comments)    Light headed/ dizzy    Current Outpatient Medications on File Prior to Visit  Medication Sig Dispense Refill   Acetaminophen  (TYLENOL  PO) Take by mouth as needed.     amLODipine  (NORVASC ) 2.5 MG tablet Take 1 tablet (2.5 mg total) by mouth daily. for blood pressure. 90 tablet 0   clindamycin  (CLEOCIN  T) 1 % lotion Apply topically 2 (two) times daily as needed. For hidradenitis suppurativa 60 mL 0   rizatriptan  (MAXALT -MLT) 10 MG disintegrating tablet Take 1 tablet (10 mg total) by mouth as needed for migraine. May repeat in 2 hours if needed. Maximum 2 in one day 9 tablet 11   tirzepatide  (MOUNJARO ) 12.5 MG/0.5ML Pen Inject 12.5 mg into the skin once a week. for diabetes. 6 mL 0   topiramate  (TOPAMAX ) 50 MG tablet Start on one pill at bedtime(50mg ). Then  in 1 week increase to 2 pills at bedtime (100mg ). 60 tablet 6   valsartan  (DIOVAN ) 160 MG tablet Take 1 tablet (160 mg total) by mouth daily. for blood pressure 90 tablet 0   No current facility-administered medications on file prior to visit.    BP 122/76 (BP Location: Left Arm, Patient Position: Sitting, Cuff Size: Normal)   Pulse 90   Temp 97.9 F (36.6 C) (Temporal)   Ht 5\' 9"  (1.753 m)   Wt 189 lb (85.7 kg)   LMP 12/12/2023 (Exact Date)   SpO2 96%   BMI 27.91 kg/m  Objective:   Physical Exam HENT:     Right Ear: Tympanic membrane and ear canal normal.     Left Ear: Tympanic membrane and ear canal normal.  Eyes:     Pupils: Pupils are equal, round, and reactive to light.  Cardiovascular:     Rate and Rhythm: Normal rate and regular rhythm.  Pulmonary:     Effort: Pulmonary effort is normal.     Breath sounds: Normal breath sounds.  Abdominal:     General: Bowel sounds are normal.     Palpations: Abdomen is soft.     Tenderness: There is no abdominal tenderness.     Comments: Significant pannus noted upon exam today  Musculoskeletal:        General: Normal range of motion.     Cervical back: Neck supple.  Skin:    General: Skin is warm and dry.  Neurological:     Mental Status: She is alert and oriented to person, place, and time.     Cranial Nerves: No cranial nerve deficit.     Deep Tendon Reflexes:     Reflex Scores:      Patellar reflexes are 2+ on the right side and 2+ on the left side. Psychiatric:        Mood and Affect: Mood normal.           Assessment & Plan:  Encounter for annual general medical examination with abnormal findings in adult Assessment & Plan: Immunizations UTD.  Declines pneumonia vaccine. Pap smear UTD.  Discussed the importance of a healthy diet and regular exercise in order for weight loss, and to reduce the risk of further co-morbidity.  Exam stable. Labs pending.  Follow up in 1 year for repeat physical.    Migraine  without aura and without status migrainosus, not intractable Assessment & Plan: Stable.  Continue Topamax  100 mg daily. Maxalt  10 mg PRN.  Following with neurology.   Primary hypertension Assessment & Plan: Controlled.  Continue valsartan  160 mg daily, amlodipine  2.5 mg daily. CMP pending.   Status post transsphenoidal pituitary resection [facility]) Assessment & Plan: Following with endocrinology.    Overweight with body mass index (BMI) of 27 to 27.9 in adult Assessment & Plan: Commended her on weight loss!  Increase exercise. Continue to work on diet.    Hidradenitis suppurativa Assessment & Plan: Improved but with itching.  Continue clindamycin  1% lotion BID.  Add hydrocortisone  OTC.  She will update.    Type 2 diabetes mellitus with hyperglycemia, without long-term current use of insulin  (HCC) Assessment & Plan: Repeat A1c pending.  Commended her on weight loss! Continue Mounjaro  12.5 mg weekly.  Follow-up in 6 months.  Orders: -     Comprehensive metabolic panel with GFR -     Hemoglobin A1c -     Lipid panel  Abdominal pannus Assessment & Plan: Based on exam today, it does not appear that abdominal exercise will resolve the issue. We did discuss to increase cardio exercise in general.  Discussed options, we agree for plastic surgery consultation.  Referral placed.   Orders: -     Ambulatory referral to Plastic Surgery        Gabriel John, NP

## 2023-12-25 ENCOUNTER — Other Ambulatory Visit: Payer: Self-pay

## 2023-12-25 LAB — COMPREHENSIVE METABOLIC PANEL WITH GFR
ALT: 11 U/L (ref 0–35)
AST: 13 U/L (ref 0–37)
Albumin: 4.2 g/dL (ref 3.5–5.2)
Alkaline Phosphatase: 54 U/L (ref 39–117)
BUN: 12 mg/dL (ref 6–23)
CO2: 24 meq/L (ref 19–32)
Calcium: 9.1 mg/dL (ref 8.4–10.5)
Chloride: 106 meq/L (ref 96–112)
Creatinine, Ser: 0.76 mg/dL (ref 0.40–1.20)
GFR: 99.56 mL/min (ref >60.00)
Glucose, Bld: 78 mg/dL (ref 70–99)
Potassium: 3.9 meq/L (ref 3.5–5.1)
Sodium: 137 meq/L (ref 135–145)
Total Bilirubin: 0.5 mg/dL (ref 0.2–1.2)
Total Protein: 6.9 g/dL (ref 6.0–8.3)

## 2023-12-25 LAB — LIPID PANEL
Cholesterol: 143 mg/dL (ref 0–200)
HDL: 32.6 mg/dL — ABNORMAL LOW (ref >39.00)
LDL Cholesterol: 93 mg/dL (ref 0–99)
NonHDL: 110.39
Total CHOL/HDL Ratio: 4
Triglycerides: 87 mg/dL (ref 0.0–149.0)
VLDL: 17.4 mg/dL (ref 0.0–40.0)

## 2023-12-25 LAB — HEMOGLOBIN A1C: Hgb A1c MFr Bld: 5.5 % (ref 4.6–6.5)

## 2023-12-26 ENCOUNTER — Ambulatory Visit: Admitting: Plastic Surgery

## 2023-12-26 ENCOUNTER — Encounter: Payer: Self-pay | Admitting: Plastic Surgery

## 2023-12-26 VITALS — BP 125/88 | HR 96 | Ht 69.0 in | Wt 189.0 lb

## 2023-12-26 DIAGNOSIS — E65 Localized adiposity: Secondary | ICD-10-CM | POA: Diagnosis not present

## 2023-12-26 DIAGNOSIS — Z7985 Long-term (current) use of injectable non-insulin antidiabetic drugs: Secondary | ICD-10-CM | POA: Diagnosis not present

## 2023-12-26 DIAGNOSIS — D352 Benign neoplasm of pituitary gland: Secondary | ICD-10-CM

## 2023-12-26 DIAGNOSIS — I10 Essential (primary) hypertension: Secondary | ICD-10-CM

## 2023-12-26 DIAGNOSIS — E1165 Type 2 diabetes mellitus with hyperglycemia: Secondary | ICD-10-CM | POA: Diagnosis not present

## 2023-12-26 DIAGNOSIS — L732 Hidradenitis suppurativa: Secondary | ICD-10-CM

## 2023-12-26 DIAGNOSIS — M793 Panniculitis, unspecified: Secondary | ICD-10-CM | POA: Diagnosis not present

## 2023-12-26 DIAGNOSIS — E893 Postprocedural hypopituitarism: Secondary | ICD-10-CM

## 2023-12-26 NOTE — Progress Notes (Signed)
 Patient ID: Julia Kline, female    DOB: Aug 23, 1985, 38 y.o.   MRN: [id number]   Chief Complaint  Patient presents with   Advice Only    The patient is a 38 year old female here for evaluation of her abdomen.  She is 5 feet 9 inches tall and weighs 189 pounds.  She has lost weight really well over the past year from 270 pounds with diet and exercise.  Her BMI is recorded as 27.9.  She has had C-sections twice and does have diabetes.  This is controlled with Mounjaro  and her last hemoglobin A1c in May was 5.5.  She has quite a bit of skin hanging over.  This does cause some rashes and skin breakdown.  There is no noticeable hernia or weakness in her abdominal wall.    Review of Systems  Constitutional: Negative.   HENT: Negative.    Eyes: Negative.   Respiratory: Negative.    Cardiovascular: Negative.   Gastrointestinal: Negative.   Endocrine: Negative.   Genitourinary: Negative.   Musculoskeletal:  Positive for back pain and neck pain.  Skin:  Positive for rash.    Past Medical History:  Diagnosis Date   Acute conjunctivitis of right eye 07/10/2019   Acute pain of right knee 08/06/2019   Anemia 10/14/2019   Complicated UTI (urinary tract infection) 03/10/2021   COVID 2020   had pneumonia   COVID-19    Diabetes mellitus without complication (HCC)    Eczema    Hernia cerebri (HCC)    Hidradenitis axillaris 04/20/2020   Followed by [facility]. Last seen 05/26/2020: instructed to wash b/l with Hibiclens  and take Doxycycline  as prescribed and follow up in one month.    History of COVID-19 12/21/2019   History of gestational diabetes 05/02/2017   Normal pp testing 10/2017   History of severe pre-eclampsia 05/02/2017   Hypertension    Impingement syndrome of left shoulder 02/16/2020   knee   Mastitis 06/18/2019   Migraine    PCOS (polycystic ovarian syndrome)    Pneumonia    Pregnancy induced hypertension    Skin lesions 09/12/2020    Past  Surgical History:  Procedure Laterality Date   CESAREAN SECTION  2017   Procedure: CESAREAN DELIVERY ONLY; Surgeon: Kalpen Navin Patel, MD; Location: C-SECTION SUITE [facility]; Service: Obstetrics   CESAREAN SECTION N/A 10/02/2017   Procedure: CESAREAN SECTION;  Surgeon: Albino Hum, MD;  Location: [facility] BIRTHING SUITES;  Service: Obstetrics;  Laterality: N/A;   CRANIOTOMY N/A 03/06/2023   Procedure: endoscopic endonasal resection of pituitary tumor;  Surgeon: Cannon Champion, MD;  Location: [facility] OR;  Service: Neurosurgery;  Laterality: N/A;   DILATION AND EVACUATION N/A 03/06/2022   Procedure: DILATATION AND EVACUATION;  Surgeon: Othelia Blinks, MD;  Location: [facility] OR;  Service: Gynecology;  Laterality: N/A;   I & D EXTREMITY Left 10/14/2020   Procedure: Irrigation and debridement left small and ring finger with repair of tendon;  Surgeon: Ronn Cohn, MD;  Location: [facility] OR;  Service: Orthopedics;  Laterality: Left;  1 hr Block with IV Sed   LAPAROSCOPIC OOPHERECTOMY Left    LEEP     OVARIAN CYST REMOVAL     TONSILLECTOMY     As a child   TRANSPHENOIDAL APPROACH EXPOSURE N/A 03/06/2023   Procedure: TRANSPHENOIDAL APPROACH EXPOSURE;  Surgeon: Daleen Dubs, DO;  Location: [facility] OR;  Service: ENT;  Laterality: N/A;   WISDOM TOOTH EXTRACTION  Current Outpatient Medications:    Acetaminophen  (TYLENOL  PO), Take by mouth as needed., Disp: , Rfl:    amLODipine  (NORVASC ) 2.5 MG tablet, Take 1 tablet (2.5 mg total) by mouth daily. for blood pressure., Disp: 90 tablet, Rfl: 0   clindamycin  (CLEOCIN  T) 1 % lotion, Apply topically 2 (two) times daily as needed. For hidradenitis suppurativa, Disp: 60 mL, Rfl: 0   rizatriptan  (MAXALT -MLT) 10 MG disintegrating tablet, Take 1 tablet (10 mg total) by mouth as needed for migraine. May repeat in 2 hours if needed. Maximum 2 in one day, Disp: 9 tablet, Rfl: 11   tirzepatide  (MOUNJARO ) 12.5 MG/0.5ML Pen, Inject 12.5 mg into the skin once a week.  for diabetes., Disp: 6 mL, Rfl: 0   topiramate  (TOPAMAX ) 50 MG tablet, Start on one pill at bedtime(50mg ). Then in 1 week increase to 2 pills at bedtime (100mg )., Disp: 60 tablet, Rfl: 6   valsartan  (DIOVAN ) 160 MG tablet, Take 1 tablet (160 mg total) by mouth daily. for blood pressure, Disp: 90 tablet, Rfl: 0   Objective:   Vitals:   12/26/23 0943  BP: 125/88  Pulse: 96  SpO2: 100%    Physical Exam Vitals and nursing note reviewed.  Constitutional:      Appearance: Normal appearance.  HENT:     Head: Atraumatic.  Cardiovascular:     Rate and Rhythm: Normal rate.     Pulses: Normal pulses.  Pulmonary:     Effort: Pulmonary effort is normal.  Abdominal:     General: There is no distension.     Palpations: Abdomen is soft. There is no mass.     Tenderness: There is no abdominal tenderness.     Hernia: No hernia is present.  Musculoskeletal:        General: No swelling or deformity.  Skin:    General: Skin is warm.     Capillary Refill: Capillary refill takes less than 2 seconds.  Neurological:     Mental Status: She is alert and oriented to person, place, and time.  Psychiatric:        Mood and Affect: Mood normal.        Behavior: Behavior normal.        Thought Content: Thought content normal.        Judgment: Judgment normal.     Assessment & Plan:  Type 2 diabetes mellitus with hyperglycemia, without long-term current use of insulin  (HCC)  Pituitary adenoma (HCC)  Hidradenitis suppurativa  Status post transsphenoidal pituitary resection (HCC)  Abdominal pannus  Primary hypertension  Panniculitis  The patient is a good candidate for panniculectomy and possible add-on abdominoplasty.  She has been really diligent about weight loss.  She is considering 20-25 more pounds so we would look at doing surgery in about 3 months.  She is encouraged to continue with the healthy eating.  Pictures were obtained of the patient and placed in the chart with the patient's  or guardian's permission.  Lindaann Requena Daunte Oestreich, DO

## 2023-12-28 ENCOUNTER — Other Ambulatory Visit: Payer: Self-pay | Admitting: Primary Care

## 2023-12-28 DIAGNOSIS — E1165 Type 2 diabetes mellitus with hyperglycemia: Secondary | ICD-10-CM

## 2024-01-08 ENCOUNTER — Telehealth (INDEPENDENT_AMBULATORY_CARE_PROVIDER_SITE_OTHER): Payer: 59 | Admitting: Neurology

## 2024-01-08 ENCOUNTER — Telehealth: Payer: Self-pay | Admitting: Neurology

## 2024-01-08 DIAGNOSIS — G43711 Chronic migraine without aura, intractable, with status migrainosus: Secondary | ICD-10-CM | POA: Diagnosis not present

## 2024-01-08 NOTE — Telephone Encounter (Signed)
 Spoke with patient and scheduled VV with Megan for 09/09/23 at 3pm

## 2024-01-08 NOTE — Telephone Encounter (Signed)
 Follow up in 6-7 months for a med check with Megan or amy video

## 2024-01-08 NOTE — Progress Notes (Signed)
 [facility]    Provider:  Dr Tresia Fruit Requesting Provider: Gabriel John, NP Primary Care Provider:  Gabriel John, NP  CC:  Migraines  Virtual Visit via Video Note  I connected with Julia Kline on 01/10/24 at  2:00 PM EDT by a video enabled telemedicine application and verified that I am speaking with the correct person using two identifiers.  Location: Patient: home Provider: office   I discussed the limitations of evaluation and management by telemedicine and the availability of in person appointments. The patient expressed understanding and agreed to proceed.    Follow Up Instructions:    I discussed the assessment and treatment plan with the patient. The patient was provided an opportunity to ask questions and all were answered. The patient agreed with the plan and demonstrated an understanding of the instructions.   The patient was advised to call back or seek an in-person evaluation if the symptoms worsen or if the condition fails to improve as anticipated.  I provided 10 minutes of non-face-to-face time during this encounter.   Glory Larsen, MD   01/10/2024:She is doing well. Hasn' thad very many migraines. She may have a migraine when time for her cycle. Week of her cycle or after she may have one migraine and the maxalt  works. 2 50mg  topiramate  pills at bedtime. Take enough calcium, vitamin D. Multivitamin daily and eat yogurt and a healthy diet. In 6 months could re-valuate and see if decrease to 50mg  a bedtime.   Patient complains of symptoms per HPI as well as the following symptoms: none . Pertinent negatives and positives per HPI. All others negative   HPI:  Julia Kline is a 38 y.o. female here as requested by Gabriel John, NP for Migraines:has Abnormal uterine bleeding; Migraines; Primary hypertension; Type 2 diabetes mellitus with hyperglycemia (HCC); Hidradenitis suppurativa; Encounter for annual general  medical examination with abnormal findings in adult; History of pre-eclampsia in prior pregnancy, currently pregnant; Ovalocytosis (HCC); Overweight with body mass index (BMI) of 27 to 27.9 in adult; Perirectal abscess; Chest pressure; Secondary amenorrhea; Pituitary adenoma (HCC); Status post transsphenoidal pituitary resection (HCC); Dyspnea; Abdominal pannus; and Panniculitis on their problem list.   Patient reports that her Mother had migraines and she has had since a child/teenager. Lately she is having headaches pressure in the forehead, light/sound sensitivity, feels like a band around the head or pulsating/pounding/throbbing, nausea, excedrin doesn;t help anymore, hurts to move, she has a constant headache, more with her menstrual cycles. Getting worse for a long time even before the pituitary surgery. She only has some more light senistivity since the surgery nothing else has really changed. Waking up with headaches, waking up even before she gets out of bed, she snores but not excessively tired and no other symptoms of OSA. Headaches are not worse positionally. No blurry vision or changes in vision. 8 or 9 moderate migraines a month with light sensitivity.   Reviewed notes, labs and imaging from outside physicians, which showed:  Amodipine, amitriptyline, esmolol , metoprolol , valsartan    06/13/2023: MRi brain IMPRESSION: 1. Interval pituitary adenoma resection with enhancing soft tissue in the sella as above, potentially reflecting a combination of residual normal pituitary tissue and postoperative changes. A small amount of residual tumor is not excluded, and continued follow-up is recommended. 2. Otherwise unremarkable appearance of the brain.     Latest Ref Rng & Units 03/06/2023    3:18 PM 02/26/2023    3:31 PM 10/11/2022  4:05 PM  CBC  WBC 4.0 - 10.5 K/uL 12.0  6.7  8.1   Hemoglobin 12.0 - 15.0 g/dL 16.1  09.6  04.5   Hematocrit 36.0 - 46.0 % 35.1  37.9  34.6   Platelets 150 -  400 K/uL 244  253  267.0       Latest Ref Rng & Units 12/24/2023    3:36 PM 03/12/2023    8:43 AM 03/07/2023   11:29 AM  CMP  Glucose 70 - 99 mg/dL 78  409  811   BUN 6 - 23 mg/dL 12  9  7    Creatinine 0.40 - 1.20 mg/dL 9.14  7.82  9.56   Sodium 135 - 145 mEq/L 137  128  135   Potassium 3.5 - 5.1 mEq/L 3.9  3.9  3.6   Chloride 96 - 112 mEq/L 106  91  103   CO2 19 - 32 mEq/L 24  27  25    Calcium 8.4 - 10.5 mg/dL 9.1  8.4  8.5   Total Protein 6.0 - 8.3 g/dL 6.9     Total Bilirubin 0.2 - 1.2 mg/dL 0.5     Alkaline Phos 39 - 117 U/L 54     AST 0 - 37 U/L 13     ALT 0 - 35 U/L 11       Recent Results (from the past 2160 hours)  Comprehensive metabolic panel with GFR     Status: None   Collection Time: 12/24/23  3:36 PM  Result Value Ref Range   Sodium 137 135 - 145 mEq/L   Potassium 3.9 3.5 - 5.1 mEq/L   Chloride 106 96 - 112 mEq/L   CO2 24 19 - 32 mEq/L   Glucose, Bld 78 70 - 99 mg/dL   BUN 12 6 - 23 mg/dL   Creatinine, Ser 2.13 0.40 - 1.20 mg/dL   Total Bilirubin 0.5 0.2 - 1.2 mg/dL   Alkaline Phosphatase 54 39 - 117 U/L   AST 13 0 - 37 U/L   ALT 11 0 - 35 U/L   Total Protein 6.9 6.0 - 8.3 g/dL   Albumin  4.2 3.5 - 5.2 g/dL   GFR 08.65 >78.46 mL/min    Comment: Calculated using the CKD-EPI Creatinine Equation (2021)   Calcium 9.1 8.4 - 10.5 mg/dL  Hemoglobin N6E     Status: None   Collection Time: 12/24/23  3:36 PM  Result Value Ref Range   Hgb A1c MFr Bld 5.5 4.6 - 6.5 %    Comment: Glycemic Control Guidelines for People with Diabetes:Non Diabetic:  <6%Goal of Therapy: <7%Additional Action Suggested:  >8%   Lipid panel     Status: Abnormal   Collection Time: 12/24/23  3:36 PM  Result Value Ref Range   Cholesterol 143 0 - 200 mg/dL    Comment: ATP III Classification       Desirable:  < 200 mg/dL               Borderline High:  200 - 239 mg/dL          High:  > = 952 mg/dL   Triglycerides 84.1 0.0 - 149.0 mg/dL    Comment: Normal:  <324 mg/dLBorderline High:  150 - 199  mg/dL   HDL 40.10 (L) >27.25 mg/dL   VLDL 36.6 0.0 - 44.0 mg/dL   LDL Cholesterol 93 0 - 99 mg/dL   Total CHOL/HDL Ratio 4     Comment:  Men          Women1/2 Average Risk     3.4          3.3Average Risk          5.0          4.42X Average Risk          9.6          7.13X Average Risk          15.0          11.0                       NonHDL 110.39     Comment: NOTE:  Non-HDL goal should be 30 mg/dL higher than patient's LDL goal (i.e. LDL goal of < 70 mg/dL, would have non-HDL goal of < 100 mg/dL)    Review of Systems: Patient complains of symptoms per HPI as well as the following symptoms none. Pertinent negatives and positives per HPI. All others negative.   Social History   Socioeconomic History   Marital status: Married    Spouse name: Not on file   Number of children: Not on file   Years of education: Not on file   Highest education level: Master's degree (e.g., MA, MS, MEng, MEd, MSW, MBA)  Occupational History   Not on file  Tobacco Use   Smoking status: Never   Smokeless tobacco: Never  Vaping Use   Vaping status: Never Used  Substance and Sexual Activity   Alcohol use: No   Drug use: No   Sexual activity: Yes    Birth control/protection: None  Other Topics Concern   Not on file  Social History Narrative   Married.   2 children.   Works at [employer].   Social Drivers of Corporate investment banker Strain: Low Risk  (12/14/2022)   Overall Financial Resource Strain (CARDIA)    Difficulty of Paying Living Expenses: Not very hard  Food Insecurity: Low Risk  (03/27/2023)   Received from [facility]   Hunger Vital Sign    Worried About Running Out of Food in the Last Year: Never true    Ran Out of Food in the Last Year: Never true  Transportation Needs: No Transportation Needs (03/08/2023)   PRAPARE - Administrator, Civil Service (Medical): No    Lack of Transportation (Non-Medical): No  Physical Activity: Sufficiently Active  (12/14/2022)   Exercise Vital Sign    Days of Exercise per Week: 6 days    Minutes of Exercise per Session: 150+ min  Stress: Not on file  Social Connections: Moderately Integrated (12/14/2022)   Social Connection and Isolation Panel [NHANES]    Frequency of Communication with Friends and Family: More than three times a week    Frequency of Social Gatherings with Friends and Family: Once a week    Attends Religious Services: More than 4 times per year    Active Member of [employer] or Organizations: No    Attends Banker Meetings: Not on file    Marital Status: Married  Catering manager Violence: Not At Risk (03/08/2023)   Humiliation, Afraid, Rape, and Kick questionnaire    Fear of Current or Ex-Partner: No    Emotionally Abused: No    Physically Abused: No    Sexually Abused: No    Family History  Problem Relation Age of Onset   Diabetes Mother  Migraines Mother    Diabetes Father    Hypertension Father    Hypertension Maternal Grandmother    Diabetes Maternal Grandmother    Diabetes Maternal Grandfather    COPD Maternal Grandfather    Diabetes Paternal Grandmother    Hypertension Paternal Grandmother    Diabetes Paternal Grandfather    Hypertension Paternal Grandfather    Heart attack Paternal Grandfather    Asthma Sister     Past Medical History:  Diagnosis Date   Acute conjunctivitis of right eye 07/10/2019   Acute pain of right knee 08/06/2019   Anemia 10/14/2019   Complicated UTI (urinary tract infection) 03/10/2021   COVID 2020   had pneumonia   COVID-19    Diabetes mellitus without complication (HCC)    Eczema    Hernia cerebri (HCC)    Hidradenitis axillaris 04/20/2020   Followed by [facility]. Last seen 05/26/2020: instructed to wash b/l with Hibiclens  and take Doxycycline  as prescribed and follow up in one month.    History of COVID-19 12/21/2019   History of gestational diabetes 05/02/2017   Normal pp testing 10/2017   History  of severe pre-eclampsia 05/02/2017   Hypertension    Impingement syndrome of left shoulder 02/16/2020   knee   Mastitis 06/18/2019   Migraine    PCOS (polycystic ovarian syndrome)    Pneumonia    Pregnancy induced hypertension    Skin lesions 09/12/2020    Patient Active Problem List   Diagnosis Date Noted   Panniculitis 12/26/2023   Abdominal pannus 12/24/2023   Dyspnea 05/15/2023   Status post transsphenoidal pituitary resection (HCC) 03/06/2023   Pituitary adenoma (HCC) 12/14/2022   Secondary amenorrhea 11/14/2022   Chest pressure 10/11/2022   Perirectal abscess 06/26/2022   Overweight with body mass index (BMI) of 27 to 27.9 in adult 06/14/2022   Ovalocytosis (HCC) 03/03/2022   History of pre-eclampsia in prior pregnancy, currently pregnant 02/15/2022   Encounter for annual general medical examination with abnormal findings in adult 11/30/2021   Hidradenitis suppurativa 04/20/2020   Type 2 diabetes mellitus with hyperglycemia (HCC) 12/21/2019   Primary hypertension 10/13/2019   Migraines 04/01/2018   Abnormal uterine bleeding 01/06/2018    Past Surgical History:  Procedure Laterality Date   CESAREAN SECTION  2017   Procedure: CESAREAN DELIVERY ONLY; Surgeon: Kalpen Navin Patel, MD; [facility]: [facility]; Service: Obstetrics   CESAREAN SECTION N/A 10/02/2017   Procedure: CESAREAN SECTION;  Surgeon: Albino Hum, MD;  Location: [facility];  Service: Obstetrics;  Laterality: N/A;   CRANIOTOMY N/A 03/06/2023   Procedure: endoscopic endonasal resection of pituitary tumor;  Surgeon: Cannon Champion, MD;  Location: [facility] OR;  Service: Neurosurgery;  Laterality: N/A;   DILATION AND EVACUATION N/A 03/06/2022   Procedure: DILATATION AND EVACUATION;  Surgeon: Othelia Blinks, MD;  Location: [facility] OR;  Service: Gynecology;  Laterality: N/A;   I & D EXTREMITY Left 10/14/2020   Procedure: Irrigation and debridement left small and ring finger with repair of tendon;   Surgeon: Ronn Cohn, MD;  Location: [facility] OR;  Service: Orthopedics;  Laterality: Left;  1 hr Block with IV Sed   LAPAROSCOPIC OOPHERECTOMY Left    LEEP     OVARIAN CYST REMOVAL     TONSILLECTOMY     As a child   TRANSPHENOIDAL APPROACH EXPOSURE N/A 03/06/2023   Procedure: TRANSPHENOIDAL APPROACH EXPOSURE;  Surgeon: Daleen Dubs, DO;  Location: [facility] OR;  Service: ENT;  Laterality: N/A;  WISDOM TOOTH EXTRACTION      Current Outpatient Medications  Medication Sig Dispense Refill   Acetaminophen  (TYLENOL  PO) Take by mouth as needed.     amLODipine  (NORVASC ) 2.5 MG tablet Take 1 tablet (2.5 mg total) by mouth daily. for blood pressure. 90 tablet 0   clindamycin  (CLEOCIN  T) 1 % lotion Apply topically 2 (two) times daily as needed. For hidradenitis suppurativa 60 mL 0   rizatriptan  (MAXALT -MLT) 10 MG disintegrating tablet Take 1 tablet (10 mg total) by mouth as needed for migraine. May repeat in 2 hours if needed. Maximum 2 in one day 9 tablet 11   tirzepatide  (MOUNJARO ) 12.5 MG/0.5ML Pen INJECT 12.5 MG INTO THE SKIN ONCE A WEEK FOR DIABETES 6 mL 1   topiramate  (TOPAMAX ) 50 MG tablet Start on one pill at bedtime(50mg ). Then in 1 week increase to 2 pills at bedtime (100mg ). 60 tablet 6   valsartan  (DIOVAN ) 160 MG tablet Take 1 tablet (160 mg total) by mouth daily. for blood pressure 90 tablet 0   No current facility-administered medications for this visit.    Allergies as of 01/08/2024 - Review Complete 12/26/2023  Allergen Reaction Noted   Ancef  [cefazolin ] Hives and Swelling 16/05/9603   Fentanyl  Hives and Swelling 10/03/2017   Shrimp [shellfish allergy] Swelling and Other (See Comments) 08/22/2018   Chlorhexidine  Hives, Itching, and Rash 03/06/2022   Morphine and codeine Rash 07/23/2014   Pseudoephedrine hcl Other (See Comments)     Vitals: LMP 12/12/2023 (Exact Date)  Last Weight:  Wt Readings from Last 1 Encounters:  12/26/23 189 lb (85.7 kg)   Last Height:   Ht  Readings from Last 1 Encounters:  12/26/23 5\' 9"  (1.753 m)    Physical exam: Exam: Gen: NAD, conversant      CV: No palpitations or chest pain or SOB. VS: Breathing at a normal rate. Weight appears within normal limits. Not febrile. Eyes: Conjunctivae clear without exudates or hemorrhage  Neuro: Detailed Neurologic Exam  Speech:    Speech is normal; fluent and spontaneous with normal comprehension.  Cognition:    The patient is oriented to person, place, and time;     recent and remote memory intact;     language fluent;     normal attention, concentration, fund of knowledge Cranial Nerves:    The pupils are equal, round, and reactive to light. Visual fields are full Extraocular movements are intact.  The face is symmetric with normal sensation. The palate elevates in the midline. Hearing intact. Voice is normal. Shoulder shrug is normal. The tongue has normal motion without fasciculations.   Coordination: normal  Gait:    No abnormalities noted or reported  Motor Observation:   no involuntary movements noted. Tone:    Appears normal  Posture:    Posture is normal. normal erect    Strength:    Strength is anti-gravity and symmetric in the upper and lower limbs.      Sensation: intact to LT, no reports of numbness or tingling or paresthesias          Assessment/Plan:  Patient with chronic migraines. Worsening since pituitary adenoma surgery however she has had migraines for years and has a family history, repeat MRI brain did not show an etiology for her worsening headaches.Neuro exam normal.  Discussed options in detail with patient.    01/10/2024:She is doing well. Hasn' thad very many migraines. She may have a migraine when time for her cycle. Week of her cycle or after  she may have one migraine and the maxalt  works. 2 50mg  topiramate  pills at bedtime. Take enough calcium, vitamin D. Multivitamin daily and eat yogurt and a healthy diet. In 6 months could re-valuate  and see if decrease to 50mg  a bedtime.   Preventative: Topiramate  is  traditional migraine preventative. If you fail/have side effects to the topiramate  would tryThe new medications include Ajovy, Emgality, Aimovig, Qulipta  Acute management: Rizatriptan  : Please take one tablet at the onset of your headache. If it does not improve the symptoms please take one additional tablet. Do not take more then 2 tablets in 24hrs. Do not take use more then 2 to 3 times in a week. If you have side effects to this try anohter triptan (such as eletriptan) new medications Nurtec, Ubrelvy.   No orders of the defined types were placed in this encounter.  Discussed: To prevent or relieve headaches, try the following: Cool Compress. Lie down and place a cool compress on your head.  Avoid headache triggers. If certain foods or odors seem to have triggered your migraines in the past, avoid them. A headache diary might help you identify triggers.  Include physical activity in your daily routine. Try a daily walk or other moderate aerobic exercise.  Manage stress. Find healthy ways to cope with the stressors, such as delegating tasks on your to-do list.  Practice relaxation techniques. Try deep breathing, yoga, massage and visualization.  Eat regularly. Eating regularly scheduled meals and maintaining a healthy diet might help prevent headaches. Also, drink plenty of fluids.  Follow a regular sleep schedule. Sleep deprivation might contribute to headaches Consider biofeedback. With this mind-body technique, you learn to control certain bodily functions -- such as muscle tension, heart rate and blood pressure -- to prevent headaches or reduce headache pain.    Proceed to emergency room if you experience new or worsening symptoms or symptoms do not resolve, if you have new neurologic symptoms or if headache is severe, or for any concerning symptom.   Provided education and documentation from American headache Society  toolbox including articles on: chronic migraine medication overuse headache, chronic migraines, prevention of migraines, behavioral and other nonpharmacologic treatments for headache.   Cc: Gabriel John, NP,  Gabriel John, NP  Aldona Amel, MD  [facility] [address] [place], [place] [address]  Phone [phone] Fax [phone]

## 2024-01-16 ENCOUNTER — Other Ambulatory Visit: Payer: 59

## 2024-01-16 ENCOUNTER — Ambulatory Visit

## 2024-01-21 LAB — ACTH: C206 ACTH: 8 pg/mL (ref 6–50)

## 2024-01-21 LAB — INSULIN-LIKE GROWTH FACTOR
IGF-I, LC/MS: 88 ng/mL (ref 53–331)
Z-Score (Female): -1 {STDV} (ref ?–2.0)

## 2024-01-21 LAB — GROWTH HORMONE: Growth Hormone: 0.1 ng/mL (ref <=7.1)

## 2024-01-21 LAB — T4, FREE: Free T4: 1.4 ng/dL (ref 0.8–1.8)

## 2024-01-21 LAB — TSH: TSH: 0.97 m[IU]/L

## 2024-01-21 LAB — FOLLICLE STIMULATING HORMONE: FSH: 7.8 m[IU]/mL

## 2024-01-21 LAB — BASIC METABOLIC PANEL WITH GFR
BUN: 14 mg/dL (ref 7–25)
CO2: 23 mmol/L (ref 20–32)
Calcium: 9.6 mg/dL (ref 8.6–10.2)
Chloride: 108 mmol/L (ref 98–110)
Creat: 0.83 mg/dL (ref 0.50–0.97)
Glucose, Bld: 79 mg/dL (ref 65–99)
Potassium: 4 mmol/L (ref 3.5–5.3)
Sodium: 139 mmol/L (ref 135–146)
eGFR: 92 mL/min/{1.73_m2} (ref >=60)

## 2024-01-21 LAB — CORTISOL: Cortisol, Plasma: 7.5 ug/dL

## 2024-01-21 LAB — LUTEINIZING HORMONE: LH: 4.1 m[IU]/mL

## 2024-01-21 LAB — PROLACTIN: Prolactin: 4.6 ng/mL

## 2024-01-21 LAB — ESTRADIOL: Estradiol: 17 pg/mL

## 2024-01-23 ENCOUNTER — Ambulatory Visit (INDEPENDENT_AMBULATORY_CARE_PROVIDER_SITE_OTHER): Payer: 59 | Admitting: "Endocrinology

## 2024-01-23 ENCOUNTER — Encounter: Payer: Self-pay | Admitting: "Endocrinology

## 2024-01-23 VITALS — BP 110/80 | HR 85 | Ht 69.0 in | Wt 184.0 lb

## 2024-01-23 DIAGNOSIS — D352 Benign neoplasm of pituitary gland: Secondary | ICD-10-CM

## 2024-01-23 NOTE — Progress Notes (Signed)
 Outpatient Endocrinology Note Julia Newcomer, MD    Julia Kline 1986/02/08 [id number]  Referring Provider: Gabriel John, NP Primary Care Provider: Gabriel John, NP Reason for consultation: Subjective   Assessment & Plan  Diagnoses and all orders for this visit:  Pituitary adenoma [facility])   Patient found to have 1.7 cm pituitary macroadenoma with suprasellar involvement and chiasmatic impingement on MRI done on 12/11/22 Patient reports developing complete amenorrhea compared to her regular menstrual cycle starting November 2023 as well as decrease in libido around the same time, some recent acne noted on face Patient does not have any other symptoms pertaining any other hormones at this time Prolactin levels has been done twice with levels between 37-39, last done a month ago Ordered other baseline labs, based on repeat prolactin may consider a trial of cabergoline Ordered urgent referral to neurosurgery: s/p  endonasal resection for nonfunctioning pituitary adenoma at [facility] by neurosurgeon Dr. Remi Carina on 03/06/23. Per notes, she tolerated surgery well, no complications  12/21/22 Pre-op labs showed mildly elevated prolactin at 47.6  06/26/23 Post op, 7:30am cortisol is low at 5.7, patient has no symptoms of low cortisol except weight loss (on mounjaro )-normal Ft4, prolactin IGF-1 (LH>FSH, 9.8>4.2) 06/2023 ACTH  stim test WNL:  Time 830  Specimen 3.7  Time. 900  Specimen 23.6  Time 1 930   SPECIMEN 2 30.7  04/2023 MRI HEAD WITHOUT AND WITH CONTRAST  Interval pituitary adenoma resection with enhancing soft tissue in the sella as above, potentially reflecting a combination of residual normal pituitary tissue and postoperative changes. A small amount of residual tumor is not excluded, and continued follow-up is recommended. 12/2023 pituitary labs WNL, cortisol again low at 7.9 (at baseline though) but given normal ACTH  stim previously and no symptoms will  continue to monitor  Need repeat MRI pituitary in 05/2024  Return in about 3 months (around 04/24/2024) for for repeat pituitary MRI .   I spent more than 50% of today's visit counseling patient on symptoms, examination findings, lab findings, imaging results, treatment decisions and monitoring and prognosis. The patient understood the recommendations and agrees with the treatment plan. All questions regarding treatment plan were fully answered  Julia Newcomer, MD    History of Present Illness Head aches once in a while No vision issues Saw ophthalmologist Denies any abdominal pain/nausea/vomiting Reports 81 lbs weight loss, on mounjaro  15 mg/week Reports good energy Got 7:30 am labs  Initial history:  Julia Kline is a 38 y.o. female referred by Dr. Adriana Hopping for evaluation and management of pituitary macroadenoma with suprasellar involvement and chiasmatic impingement on MRI done on 12/11/22.  Menstrual cycle stopped in Nov' 2023. No galactorrhea No vision issues No head aches/vomiting  She reports the following;  Headaches - visual blurring/ diplopia/ fields defect - Galactorrhea-   change in facial appearance - body habitus - change in her hand, ring, hat, shoe size - hyperhidrosis - arthralgias -  fatigue - weight change - change in appetite - heat/cold intolerance - change in bowel movements - change in muscle strength - changes in skin or hair - sweats  - palpitations  - insomnia  - tremor -   moon faces - fat pads - increased girth  - plethora hyperpigmentation -  purple striae - acne +  vellus/terminal hirsutism - proximal muscle weakness - nausea/vomiting - lightheadedness - abdominal pain -  change in libido +, decreased since a 06/2022 change in muscle strength/mass - change in  body hair - hot flashes - night sweats -  Family history is negative for pituitary tumor or other abnormalities concerning for MEN syndrome.     On:  12/11/2022 05:36: images reviewed and interpreted independently  CLINICAL DATA:  Secondary amenorrhea   EXAM: MRI HEAD WITHOUT AND WITH CONTRAST   TECHNIQUE: Multiplanar, multiecho pulse sequences of the brain and surrounding structures were obtained without and with intravenous contrast.   CONTRAST:  10mL GADAVIST  GADOBUTROL  1 MMOL/ML IV SOLN   COMPARISON:  Head CT 07/26/2022   FINDINGS: Brain: 17 mm mass inseparable from the pituitary gland. The mass is primarily cystic but there is eccentric mural thickening/nodularity especially along the superior aspect. Craniocaudal extent is 17 mm with upward bulging and chiasmatic impaction. No cavernous sinus invasion.   No infarct, hemorrhage, hydrocephalus, or collection. Brain volume is normal.   Vascular: Normal flow voids and vascular enhancements.   Skull and upper cervical spine: Normal marrow signal.   Sinuses/Orbits: Leftward nasal septal deviation.   IMPRESSION: 17 mm solid and cystic pituitary mass compatible with adenoma. There is suprasellar involvement with chiasmatic impingement.    07/26/2022 CT HEAD No pathology. No comment on pituitary.   Physical Exam  BP 110/80   Pulse 85   Ht 5\' 9"  (1.753 m)   Wt 184 lb (83.5 kg)   LMP 12/12/2023 (Exact Date)   SpO2 98%   BMI 27.17 kg/m    Constitutional: well developed, well nourished Head: normocephalic, atraumatic Eyes: sclera anicteric, no redness, intact temporal region although patient seems confused when answering on the right side initially saying no and then yes Neck: supple Lungs: normal respiratory effort Neurology: alert and oriented Skin: dry, no appreciable rashes Musculoskeletal: no appreciable defects Psychiatric: normal mood and affect   Current Medications Patient's Medications  New Prescriptions   No medications on file  Previous Medications   ACETAMINOPHEN  (TYLENOL  PO)    Take by mouth as needed.   AMLODIPINE  (NORVASC ) 2.5 MG TABLET     Take 1 tablet (2.5 mg total) by mouth daily. for blood pressure.   CLINDAMYCIN  (CLEOCIN  T) 1 % LOTION    Apply topically 2 (two) times daily as needed. For hidradenitis suppurativa   RIZATRIPTAN  (MAXALT -MLT) 10 MG DISINTEGRATING TABLET    Take 1 tablet (10 mg total) by mouth as needed for migraine. May repeat in 2 hours if needed. Maximum 2 in one day   TIRZEPATIDE  (MOUNJARO ) 12.5 MG/0.5ML PEN    INJECT 12.5 MG INTO THE SKIN ONCE A WEEK FOR DIABETES   TOPIRAMATE  (TOPAMAX ) 50 MG TABLET    Start on one pill at bedtime(50mg ). Then in 1 week increase to 2 pills at bedtime (100mg ).   VALSARTAN  (DIOVAN ) 160 MG TABLET    Take 1 tablet (160 mg total) by mouth daily. for blood pressure  Modified Medications   No medications on file  Discontinued Medications   No medications on file    Allergies Allergies  Allergen Reactions   Ancef  [Cefazolin ] Hives and Swelling    Facial and neck swelling; Had reaction after receiving fentanyl  and ancef  in the OR   Fentanyl  Hives and Swelling    Facial and neck swelling; Had reaction after receiving fentanyl  and ancef  in the OR   Shrimp [Shellfish Allergy] Swelling and Other (See Comments)    Swelling of throat   Chlorhexidine  Hives, Itching and Rash   Morphine And Codeine Rash   Pseudoephedrine Hcl Other (See Comments)    Light headed/ dizzy  Past Medical History Past Medical History:  Diagnosis Date   Acute conjunctivitis of right eye 07/10/2019   Acute pain of right knee 08/06/2019   Anemia 10/14/2019   Complicated UTI (urinary tract infection) 03/10/2021   COVID 2020   had pneumonia   COVID-19    Diabetes mellitus without complication (HCC)    Eczema    Hernia cerebri (HCC)    Hidradenitis axillaris 04/20/2020   Followed by [facility] [place] [facility]. Last seen 05/26/2020: instructed to wash b/l with Hibiclens  and take Doxycycline  as prescribed and follow up in one month.    History of COVID-19 12/21/2019   History of gestational diabetes  05/02/2017   Normal pp testing 10/2017   History of severe pre-eclampsia 05/02/2017   Hypertension    Impingement syndrome of left shoulder 02/16/2020   knee   Mastitis 06/18/2019   Migraine    PCOS (polycystic ovarian syndrome)    Pneumonia    Pregnancy induced hypertension    Skin lesions 09/12/2020    Past Surgical History Past Surgical History:  Procedure Laterality Date   CESAREAN SECTION  2017   Procedure: CESAREAN DELIVERY ONLY; Surgeon: Kalpen Navin Patel, MD; Location: [facility] SU[facility]; Service: Obstetrics   CESAREAN SECTION N/A 10/02/2017   Procedure: CESAREAN SECTION;  Surgeon: Albino Hum, MD;  Location: [facility]TH[facility] SUITES;  Service: Obstetrics;  Laterality: N/A;   CRANIOTOMY N/A 03/06/2023   Procedure: endoscopic endonasal resection of pituitary tumor;  Surgeon: Cannon Champion, MD;  Location: [facility] OR;  Service: Neurosurgery;  Laterality: N/A;   DILATION AND EVACUATION N/A 03/06/2022   Procedure: DILATATION AND EVACUATION;  Surgeon: Othelia Blinks, MD;  Location: [facility] OR;  Service: Gynecology;  Laterality: N/A;   I & D EXTREMITY Left 10/14/2020   Procedure: Irrigation and debridement left small and ring finger with repair of tendon;  Surgeon: Ronn Cohn, MD;  Location: [facility] OR;  Service: Orthopedics;  Laterality: Left;  1 hr Block with IV Sed   LAPAROSCOPIC OOPHERECTOMY Left    LEEP     OVARIAN CYST REMOVAL     TONSILLECTOMY     As a child   TRANSPHENOIDAL APPROACH EXPOSURE N/A 03/06/2023   Procedure: TRANSPHENOIDAL APPROACH EXPOSURE;  Surgeon: Daleen Dubs, DO;  Location: [facility] OR;  Service: ENT;  Laterality: N/A;   WISDOM TOOTH EXTRACTION      Family History family history includes Asthma in her sister; COPD in her maternal grandfather; Diabetes in her father, maternal grandfather, maternal grandmother, mother, paternal grandfather, and paternal grandmother; Heart attack in her paternal grandfather; Hypertension in her father, maternal  grandmother, paternal grandfather, and paternal grandmother; Migraines in her mother.  Social History Social History   Socioeconomic History   Marital status: Married    Spouse name: Not on file   Number of children: Not on file   Years of education: Not on file   Highest education level: Master's degree (e.g., MA, MS, MEng, MEd, MSW, MBA)  Occupational History   Not on file  Tobacco Use   Smoking status: Never   Smokeless tobacco: Never  Vaping Use   Vaping status: Never Used  Substance and Sexual Activity   Alcohol use: No   Drug use: No   Sexual activity: Yes    Birth control/protection: None  Other Topics Concern   Not on file  Social History Narrative   Married.   2 children.   Works at [employer].   Social Drivers of Health  Financial Resource Strain: Low Risk  (12/14/2022)   Overall Financial Resource Strain (CARDIA)    Difficulty of Paying Living Expenses: Not very hard  Food Insecurity: Low Risk  (03/27/2023)   Received from [facility]   Hunger Vital Sign    Worried About Running Out of Food in the Last Year: Never true    Ran Out of Food in the Last Year: Never true  Transportation Needs: No Transportation Needs (03/08/2023)   PRAPARE - Administrator, Civil Service (Medical): No    Lack of Transportation (Non-Medical): No  Physical Activity: Sufficiently Active (12/14/2022)   Exercise Vital Sign    Days of Exercise per Week: 6 days    Minutes of Exercise per Session: 150+ min  Stress: Not on file  Social Connections: Moderately Integrated (12/14/2022)   Social Connection and Isolation Panel [NHANES]    Frequency of Communication with Friends and Family: More than three times a week    Frequency of Social Gatherings with Friends and Family: Once a week    Attends Religious Services: More than 4 times per year    Active Member of Clubs or Organizations: No    Attends Banker Meetings: Not on file    Marital Status: Married   Intimate Partner Violence: Not At Risk (03/08/2023)   Humiliation, Afraid, Rape, and Kick questionnaire    Fear of Current or Ex-Partner: No    Emotionally Abused: No    Physically Abused: No    Sexually Abused: No    Lab Results  Component Value Date   TSH 0.97 01/16/2024   TSH 0.98 12/21/2022   FSH 7.8 01/16/2024   FSH 1.9 12/21/2022   LH 4.1 01/16/2024   LH 1.02 12/21/2022   PROLACTIN 4.6 01/16/2024   PROLACTIN 39.6 (H) 11/21/2022   PROLACTIN,UNDILUTED 47.6 (H) 12/21/2022   CORTISOL PLASMA 7.5 01/16/2024   CORTISOL PLASMA 7.9 12/21/2022   GROWTH HORMONE 0.1 01/16/2024   GROWTH HORMONE 0.1 12/21/2022   ESTRADIOL  17 01/16/2024   ESTRADIOL  61 12/21/2022   FREE T4 1.4 01/16/2024   FREE T4 0.65 12/21/2022    Lab Results  Component Value Date   CHOL 143 12/24/2023   Lab Results  Component Value Date   HDL 32.60 (L) 12/24/2023   Lab Results  Component Value Date   LDLCALC 93 12/24/2023   Lab Results  Component Value Date   TRIG 87.0 12/24/2023   Lab Results  Component Value Date   CHOLHDL 4 12/24/2023   Lab Results  Component Value Date   CREATININE 0.83 01/16/2024   Lab Results  Component Value Date   GFR 99.56 12/24/2023      Component Value Date/Time   NA 139 01/16/2024 0813   NA 145 (H) 10/09/2017 1500   K 4.0 01/16/2024 0813   CL 108 01/16/2024 0813   CO2 23 01/16/2024 0813   GLUCOSE 79 01/16/2024 0813   BUN 14 01/16/2024 0813   BUN 8 10/09/2017 1500   CREATININE 0.83 01/16/2024 0813   CALCIUM 9.6 01/16/2024 0813   PROT 6.9 12/24/2023 1536   PROT 5.7 (L) 10/09/2017 1500   ALBUMIN  4.2 12/24/2023 1536   ALBUMIN  2.7 (L) 10/09/2017 1500   AST 13 12/24/2023 1536   ALT 11 12/24/2023 1536   ALKPHOS 54 12/24/2023 1536   BILITOT 0.5 12/24/2023 1536   BILITOT <0.2 10/09/2017 1500   GFRNONAA >60 03/12/2023 0843   GFRAA >60 03/25/2020 1513      Latest  Ref Rng & Units 01/16/2024    8:13 AM 12/24/2023    3:36 PM 03/12/2023    8:43 AM  BMP  Glucose  65 - 99 mg/dL 79  78  130   BUN 7 - 25 mg/dL 14  12  9    Creatinine 0.50 - 0.97 mg/dL 8.65  7.84  6.96   BUN/Creat Ratio 6 - 22 (calc) SEE NOTE:     Sodium 135 - 146 mmol/L 139  137  128   Potassium 3.5 - 5.3 mmol/L 4.0  3.9  3.9   Chloride 98 - 110 mmol/L 108  106  91   CO2 20 - 32 mmol/L 23  24  27    Calcium 8.6 - 10.2 mg/dL 9.6  9.1  8.4        Component Value Date/Time   WBC 12.0 (H) 03/06/2023 1518   RBC 4.74 03/06/2023 1518   HGB 10.6 (L) 03/06/2023 1518   HGB 9.9 (L) 10/13/2019 1605   HGB 10.8 04/01/2017 0000   HCT 35.1 (L) 03/06/2023 1518   HCT 31.7 (L) 10/13/2019 1605   HCT 34 04/01/2017 0000   PLT 244 03/06/2023 1518   PLT 296 10/13/2019 1605   PLT 252 04/01/2017 0000   MCV 74.1 (L) 03/06/2023 1518   MCV 72 (L) 10/13/2019 1605   MCH 22.4 (L) 03/06/2023 1518   MCHC 30.2 03/06/2023 1518   RDW 14.7 03/06/2023 1518   RDW 14.3 10/13/2019 1605   LYMPHSABS 2.4 06/25/2022 2145   MONOABS 0.8 06/25/2022 2145   EOSABS 0.1 06/25/2022 2145   BASOSABS 0.1 06/25/2022 2145     Parts of this note may have been dictated using voice recognition software. There may be variances in spelling and vocabulary which are unintentional. Not all errors are proofread. Please notify the [facility] if any discrepancies are noted or if the meaning of any statement is not clear.

## 2024-02-01 ENCOUNTER — Other Ambulatory Visit: Payer: Self-pay | Admitting: Primary Care

## 2024-02-01 DIAGNOSIS — I10 Essential (primary) hypertension: Secondary | ICD-10-CM

## 2024-02-18 LAB — HM DIABETES EYE EXAM

## 2024-03-04 ENCOUNTER — Ambulatory Visit: Payer: Self-pay

## 2024-03-04 NOTE — Telephone Encounter (Signed)
 FYI Only or Action Required?: FYI only for provider.  Patient was last seen in primary care on 12/24/2023 by Gretta Comer POUR, NP.  Called Nurse Triage reporting Urinary Frequency.  Symptoms began several days ago.  Interventions attempted: OTC medications: mucinex , nasal rinses.  Symptoms are: gradually worsening.  Triage Disposition: See Physician Within 24 Hours  Patient/caregiver understands and will follow disposition?: Yes, will follow disposition  Copied from CRM [id number]. Topic: Clinical - Red Word Triage >> Mar 04, 2024  9:51 AM Gennette ORN wrote: Red Word that prompted transfer to Nurse Triage: Patient wants to know can she be seen today  she has been frequently restroom trips, she stated is seems to be bladder infection or UTI. She does have a burning sensation this has been a couple days now. She also having itchy throat that's been going on for a week, congestion and running nose as well. The color is yellow that is coming out. Reason for Disposition  Urinating more frequently than usual (i.e., frequency) OR new-onset of the feeling of an urgent need to urinate (i.e., urgency)  Common cold with no complications  Answer Assessment - Initial Assessment Questions 1. SYMPTOM: What's the main symptom you're concerned about? (e.g., frequency, incontinence)     frequency 2. ONSET: When did the  frequency  start?     Couple days 3. PAIN: Is there any pain? If Yes, ask: How bad is it? (Scale: 1-10; mild, moderate, severe)     10 when using the bathroom 4. CAUSE: What do you think is causing the symptoms?     UTI 5. OTHER SYMPTOMS: Do you have any other symptoms? (e.g., blood in urine, fever, flank pain, pain with urination)     Burning with urination, blood in urination,  6. PREGNANCY: Is there any chance you are pregnant? When was your last menstrual period?     denies  Answer Assessment - Initial Assessment Questions 1. ONSET: When did the nasal discharge  start?      About a week ago 3. COUGH: Do you have a cough? If Yes, ask: Describe the color of your mucus. (e.g., clear, white, yellow, green)     yes 4. RESPIRATORY DISTRESS: Describe your breathing.      denies 5. FEVER: Do you have a fever? If Yes, ask: What is your temperature, how was it measured, and when did it start?     denies 7. OTHER SYMPTOMS: Do you have any other symptoms? (e.g., earache, mouth sores, sore throat, wheezing)     Sore throat 8. PREGNANCY: Is there any chance you are pregnant? When was your last menstrual period?     denies  Protocols used: Urinary Symptoms-A-AH, Common Cold-A-AH

## 2024-03-05 ENCOUNTER — Ambulatory Visit (INDEPENDENT_AMBULATORY_CARE_PROVIDER_SITE_OTHER): Admitting: Family Medicine

## 2024-03-05 VITALS — BP 124/68 | HR 88 | Temp 98.4°F | Ht 69.0 in | Wt 177.1 lb

## 2024-03-05 DIAGNOSIS — J029 Acute pharyngitis, unspecified: Secondary | ICD-10-CM | POA: Insufficient documentation

## 2024-03-05 DIAGNOSIS — R0981 Nasal congestion: Secondary | ICD-10-CM | POA: Diagnosis not present

## 2024-03-05 DIAGNOSIS — J020 Streptococcal pharyngitis: Secondary | ICD-10-CM | POA: Diagnosis not present

## 2024-03-05 DIAGNOSIS — R300 Dysuria: Secondary | ICD-10-CM | POA: Insufficient documentation

## 2024-03-05 LAB — POC URINALSYSI DIPSTICK (AUTOMATED)
Bilirubin, UA: NEGATIVE
Blood, UA: NEGATIVE
Glucose, UA: NEGATIVE
Ketones, UA: NEGATIVE
Nitrite, UA: NEGATIVE
Protein, UA: NEGATIVE
Spec Grav, UA: 1.015 (ref 1.010–1.025)
Urobilinogen, UA: 0.2 U/dL
pH, UA: 6 (ref 5.0–8.0)

## 2024-03-05 LAB — POCT RAPID STREP A (OFFICE): Rapid Strep A Screen: POSITIVE — AB

## 2024-03-05 LAB — POC COVID19 BINAXNOW: SARS Coronavirus 2 Ag: NEGATIVE

## 2024-03-05 MED ORDER — AZITHROMYCIN 250 MG PO TABS: ORAL_TABLET | ORAL | 0 refills | Status: AC

## 2024-03-05 NOTE — Assessment & Plan Note (Signed)
 With freq/urgency and some blood in urine earlier in the week  Symptoms are improved today  Small leuk on urinalysis   Will send for culture  Instructed to call if symptoms worsen again in the meantime

## 2024-03-05 NOTE — Patient Instructions (Addendum)
 Drink lots of water   Your urinalysis looks fairly clear but I want to get a culture to make sure there is no urine infection  We will reach out in several days when that test returns   Take the azithromycin  as directed for strep throat  Tylenol  for pain  Rest / fluids

## 2024-03-05 NOTE — Progress Notes (Signed)
 Subjective:    Patient ID: Julia Kline, female    DOB: 18-Oct-1985, 38 y.o.   MRN: [id number]  HPI  Wt Readings from Last 3 Encounters:  03/05/24 177 lb 2 oz (80.3 kg)  01/23/24 184 lb (83.5 kg)  12/26/23 189 lb (85.7 kg)   26.16 kg/m  Vitals:   03/05/24 0807  BP: 124/68  Pulse: 88  Temp: 98.4 F (36.9 C)  SpO2: 100%    38 yo pt of NP Clark presents with c/o Urinary symptoms Throat discomfort   Urinary symptoms -started this week  Eased up a bit yesterday  Stinging / tingling with urination  It is time for period  Using condoms for contraception  A little blood in urine once  Some urgency and frequency Overall not as bad today and last night   Does not drink anything but water At least 40 oz daily     DM2 Lab Results  Component Value Date   HGBA1C 5.5 12/24/2023   HGBA1C 6.2 (A) 06/11/2023   HGBA1C 6.5 (H) 02/26/2023    Respiratory symptoms  About a week  Started with ST - very sharp and significant / now more tickly  A little cough - dry  No fever  Mild nasal congestion  No facial pain  No headache  Really tired   No n/v/d     Over the counter  Nasal saline rinse with a device -yellow mucous     Neg covid test today   Urinalysis with small leukocytes  Results for orders placed or performed in visit on 03/05/24  POC COVID-19   Collection Time: 03/05/24  8:24 AM  Result Value Ref Range   SARS Coronavirus 2 Ag Negative Negative  POCT Urinalysis Dipstick (Automated)   Collection Time: 03/05/24  8:32 AM  Result Value Ref Range   Color, UA Yellow    Clarity, UA Clear    Glucose, UA Negative Negative   Bilirubin, UA Negative    Ketones, UA Negative    Spec Grav, UA 1.015 1.010 - 1.025   Blood, UA Negative    pH, UA 6.0 5.0 - 8.0   Protein, UA Negative Negative   Urobilinogen, UA 0.2 0.2 or 1.0 E.U./dL   Nitrite, UA Negative    Leukocytes, UA Small (1+) (A) Negative  POCT rapid strep A   Collection Time: 03/05/24  8:48  AM  Result Value Ref Range   Rapid Strep A Screen Positive (A) Negative     Is severely allergic to cephazolin     Patient Active Problem List   Diagnosis Date Noted   Sore throat 03/05/2024   Dysuria 03/05/2024   Strep pharyngitis 03/05/2024   Panniculitis 12/26/2023   Abdominal pannus 12/24/2023   Dyspnea 05/15/2023   Status post transsphenoidal pituitary resection (HCC) 03/06/2023   Pituitary adenoma (HCC) 12/14/2022   Secondary amenorrhea 11/14/2022   Chest pressure 10/11/2022   Perirectal abscess 06/26/2022   Overweight with body mass index (BMI) of 27 to 27.9 in adult 06/14/2022   Ovalocytosis (HCC) 03/03/2022   History of pre-eclampsia in prior pregnancy, currently pregnant 02/15/2022   Encounter for annual general medical examination with abnormal findings in adult 11/30/2021   Hidradenitis suppurativa 04/20/2020   Type 2 diabetes mellitus with hyperglycemia (HCC) 12/21/2019   Primary hypertension 10/13/2019   Migraines 04/01/2018   Abnormal uterine bleeding 01/06/2018   Past Medical History:  Diagnosis Date   Acute conjunctivitis of right eye 07/10/2019   Acute pain of  right knee 08/06/2019   Anemia 10/14/2019   Complicated UTI (urinary tract infection) 03/10/2021   COVID 2020   had pneumonia   COVID-19    Diabetes mellitus without complication (HCC)    Eczema    Hernia cerebri (HCC)    Hidradenitis axillaris 04/20/2020   Followed by [facility]. Last seen 05/26/2020: instructed to wash b/l with Hibiclens  and take Doxycycline  as prescribed and follow up in one month.    History of COVID-19 12/21/2019   History of gestational diabetes 05/02/2017   Normal pp testing 10/2017   History of severe pre-eclampsia 05/02/2017   Hypertension    Impingement syndrome of left shoulder 02/16/2020   knee   Mastitis 06/18/2019   Migraine    PCOS (polycystic ovarian syndrome)    Pneumonia    Pregnancy induced hypertension    Skin lesions 09/12/2020    Past Surgical History:  Procedure Laterality Date   CESAREAN SECTION  2017   Procedure: CESAREAN DELIVERY ONLY; Surgeon: Kalpen Navin Patel, MD; Location: [facility]; Service: Obstetrics   CESAREAN SECTION N/A 10/02/2017   Procedure: CESAREAN SECTION;  Surgeon: Edsel Norleen GAILS, MD;  Location: [facility] SU[facility];  Service: Obstetrics;  Laterality: N/A;   CRANIOTOMY N/A 03/06/2023   Procedure: endoscopic endonasal resection of pituitary tumor;  Surgeon: Cheryle Debby LABOR, MD;  Location: [facility] OR;  Service: Neurosurgery;  Laterality: N/A;   DILATION AND EVACUATION N/A 03/06/2022   Procedure: DILATATION AND EVACUATION;  Surgeon: Lorence Ozell CROME, MD;  Location: [facility] OR;  Service: Gynecology;  Laterality: N/A;   I & D EXTREMITY Left 10/14/2020   Procedure: Irrigation and debridement left small and ring finger with repair of tendon;  Surgeon: Camella Fallow, MD;  Location: [facility] OR;  Service: Orthopedics;  Laterality: Left;  1 hr Block with IV Sed   LAPAROSCOPIC OOPHERECTOMY Left    LEEP     OVARIAN CYST REMOVAL     TONSILLECTOMY     As a child   TRANSPHENOIDAL APPROACH EXPOSURE N/A 03/06/2023   Procedure: TRANSPHENOIDAL APPROACH EXPOSURE;  Surgeon: Llewellyn Gerard LABOR, DO;  Location: [facility] OR;  Service: ENT;  Laterality: N/A;   WISDOM TOOTH EXTRACTION     Social History   Tobacco Use   Smoking status: Never   Smokeless tobacco: Never  Vaping Use   Vaping status: Never Used  Substance Use Topics   Alcohol use: No   Drug use: No   Family History  Problem Relation Age of Onset   Diabetes Mother    Migraines Mother    Diabetes Father    Hypertension Father    Hypertension Maternal Grandmother    Diabetes Maternal Grandmother    Diabetes Maternal Grandfather    COPD Maternal Grandfather    Diabetes Paternal Grandmother    Hypertension Paternal Grandmother    Diabetes Paternal Grandfather    Hypertension Paternal Grandfather    Heart attack Paternal Grandfather    Asthma  Sister    Allergies  Allergen Reactions   Ancef  [Cefazolin ] Hives and Swelling    Facial and neck swelling; Had reaction after receiving fentanyl  and ancef  in the OR   Fentanyl  Hives and Swelling    Facial and neck swelling; Had reaction after receiving fentanyl  and ancef  in the OR   Shrimp [Shellfish Allergy] Swelling and Other (See Comments)    Swelling of throat   Chlorhexidine  Hives, Itching and Rash   Morphine And Codeine Rash   Pseudoephedrine Hcl Other (See Comments)  Light headed/ dizzy   Current Outpatient Medications on File Prior to Visit  Medication Sig Dispense Refill   Acetaminophen  (TYLENOL  PO) Take by mouth as needed.     amLODipine  (NORVASC ) 2.5 MG tablet Take 1 tablet by mouth once daily for blood pressure 90 tablet 2   clindamycin  (CLEOCIN  T) 1 % lotion Apply topically 2 (two) times daily as needed. For hidradenitis suppurativa 60 mL 0   rizatriptan  (MAXALT -MLT) 10 MG disintegrating tablet Take 1 tablet (10 mg total) by mouth as needed for migraine. May repeat in 2 hours if needed. Maximum 2 in one day 9 tablet 11   tirzepatide  (MOUNJARO ) 12.5 MG/0.5ML Pen INJECT 12.5 MG INTO THE SKIN ONCE A WEEK FOR DIABETES 6 mL 1   topiramate  (TOPAMAX ) 50 MG tablet Start on one pill at bedtime(50mg ). Then in 1 week increase to 2 pills at bedtime (100mg ). 60 tablet 6   valsartan  (DIOVAN ) 160 MG tablet Take 1 tablet by mouth once daily for blood pressure 90 tablet 2   No current facility-administered medications on file prior to visit.    Review of Systems  Constitutional:  Positive for fatigue. Negative for activity change, appetite change, fever and unexpected weight change.  HENT:  Positive for postnasal drip and sore throat. Negative for congestion, ear pain, rhinorrhea, sinus pressure, trouble swallowing and voice change.   Eyes:  Negative for pain, redness and visual disturbance.  Respiratory:  Positive for cough. Negative for shortness of breath and wheezing.    Cardiovascular:  Negative for chest pain and palpitations.  Gastrointestinal:  Negative for abdominal pain, blood in stool, constipation and diarrhea.  Endocrine: Negative for polydipsia and polyuria.  Genitourinary:  Positive for dysuria, frequency and urgency.       Had blood in urine once and it is gone now   Musculoskeletal:  Negative for arthralgias, back pain and myalgias.  Skin:  Negative for pallor and rash.  Allergic/Immunologic: Negative for environmental allergies.  Neurological:  Negative for dizziness, syncope, light-headedness and headaches.  Hematological:  Negative for adenopathy. Does not bruise/bleed easily.  Psychiatric/Behavioral:  Negative for decreased concentration and dysphoric mood. The patient is not nervous/anxious.        Objective:   Physical Exam Constitutional:      General: She is not in acute distress.    Appearance: Normal appearance. She is well-developed and normal weight. She is not ill-appearing or diaphoretic.  HENT:     Head: Normocephalic and atraumatic.     Right Ear: Tympanic membrane and ear canal normal.     Left Ear: Tympanic membrane and ear canal normal.     Nose: Congestion and rhinorrhea present.     Comments: Boggy nares Mild congestion     Mouth/Throat:     Mouth: Mucous membranes are moist.     Pharynx: Oropharynx is clear. No oropharyngeal exudate or posterior oropharyngeal erythema.     Comments: Clear pnd  Eyes:     General:        Right eye: No discharge.        Left eye: No discharge.     Conjunctiva/sclera: Conjunctivae normal.     Pupils: Pupils are equal, round, and reactive to light.  Neck:     Thyroid : No thyromegaly.     Vascular: No carotid bruit or JVD.  Cardiovascular:     Rate and Rhythm: Normal rate and regular rhythm.     Heart sounds: Normal heart sounds.     No gallop.  Pulmonary:     Effort: Pulmonary effort is normal. No respiratory distress.     Breath sounds: Normal breath sounds. No stridor. No  wheezing, rhonchi or rales.     Comments: Good air exch  Abdominal:     General: There is no distension or abdominal bruit.     Palpations: Abdomen is soft.     Tenderness: There is no abdominal tenderness. There is no right CVA tenderness or left CVA tenderness.     Comments: No suprapubic tenderness or fullness    Musculoskeletal:     Cervical back: Normal range of motion and neck supple.     Right lower leg: No edema.     Left lower leg: No edema.  Lymphadenopathy:     Cervical: No cervical adenopathy.  Skin:    General: Skin is warm and dry.     Coloration: Skin is not pale.     Findings: No rash.  Neurological:     Mental Status: She is alert.     Coordination: Coordination normal.     Deep Tendon Reflexes: Reflexes are normal and symmetric. Reflexes normal.  Psychiatric:        Mood and Affect: Mood normal.           Assessment & Plan:   Problem List Items Addressed This Visit       Respiratory   Strep pharyngitis - Primary   Positive strep test with ST but reassuring exam Severe allergy to cephazolin -will treatment with azithromycin   Update if not starting to improve in a week or if worsening    Disc symptomatic care - see instructions on AVS Fluids /analgesic prn Work note and handout given        Relevant Medications   azithromycin  (ZITHROMAX ) 250 MG tablet     Other   Sore throat   Relevant Orders   POCT rapid strep A (Completed)   Dysuria   With freq/urgency and some blood in urine earlier in the week  Symptoms are improved today  Small leuk on urinalysis   Will send for culture  Instructed to call if symptoms worsen again in the meantime       Relevant Orders   POCT Urinalysis Dipstick (Automated) (Completed)   Urine Culture   Other Visit Diagnoses       Nasal congestion       Relevant Orders   POC COVID-19 (Completed)

## 2024-03-05 NOTE — Assessment & Plan Note (Signed)
 Positive strep test with ST but reassuring exam Severe allergy to cephazolin -will treatment with azithromycin   Update if not starting to improve in a week or if worsening    Disc symptomatic care - see instructions on AVS Fluids /analgesic prn Work note and handout given

## 2024-03-08 ENCOUNTER — Ambulatory Visit: Payer: Self-pay | Admitting: Family Medicine

## 2024-03-08 LAB — URINE CULTURE
MICRO NUMBER:: 16712272
SPECIMEN QUALITY:: ADEQUATE

## 2024-03-08 MED ORDER — NITROFURANTOIN MONOHYD MACRO 100 MG PO CAPS: 100.0000 mg | ORAL_CAPSULE | Freq: Two times a day (BID) | ORAL | 0 refills | Status: DC

## 2024-04-01 ENCOUNTER — Other Ambulatory Visit: Payer: Self-pay | Admitting: Neurology

## 2024-04-01 DIAGNOSIS — G43711 Chronic migraine without aura, intractable, with status migrainosus: Secondary | ICD-10-CM

## 2024-05-04 ENCOUNTER — Ambulatory Visit: Admitting: "Endocrinology

## 2024-05-14 ENCOUNTER — Ambulatory Visit: Admitting: "Endocrinology

## 2024-05-14 ENCOUNTER — Encounter: Payer: Self-pay | Admitting: "Endocrinology

## 2024-05-14 VITALS — BP 134/70 | HR 84 | Ht 69.0 in | Wt 165.0 lb

## 2024-05-14 DIAGNOSIS — E893 Postprocedural hypopituitarism: Secondary | ICD-10-CM | POA: Diagnosis not present

## 2024-05-14 DIAGNOSIS — D352 Benign neoplasm of pituitary gland: Secondary | ICD-10-CM | POA: Diagnosis not present

## 2024-05-14 NOTE — Progress Notes (Signed)
 Outpatient Endocrinology Note Julia Birmingham, MD    Julia Kline 02/18/1986 [id number]  Referring Provider: Gretta Comer POUR, NP Primary Care Provider: Gretta Comer POUR, NP Reason for consultation: Subjective   Assessment & Plan  Diagnoses and all orders for this visit:  Pituitary adenoma [facility]) -     MR Brain W Wo Contrast; Future -     TSH; Future -     T4, free; Future -     Cortisol; Future -     Prolactin; Future -     ACTH ; Future -     Insulin -like growth factor -     MR Brain W Wo Contrast -     Ambulatory referral to Neurosurgery  Status post transsphenoidal pituitary resection -     Ambulatory referral to Neurosurgery    Patient found to have 1.7 cm pituitary macroadenoma with suprasellar involvement and chiasmatic impingement on MRI done on 12/11/22 Patient reports developing complete amenorrhea compared to her regular menstrual cycle starting November 2023 as well as decrease in libido around the same time, some recent acne noted on face Patient does not have any other symptoms pertaining any other hormones at this time Prolactin levels has been done twice with levels between 37-39, last done a month ago Ordered other baseline labs, based on repeat prolactin may consider a trial of cabergoline Ordered urgent referral to neurosurgery: s/p  endonasal resection for nonfunctioning pituitary adenoma at [facility] by neurosurgeon Dr. Debby Conn on 03/06/23. Per notes, she tolerated surgery well, no complications  12/21/22 Pre-op labs showed mildly elevated prolactin at 47.6  06/26/23 Post op, 7:30am cortisol is low at 5.7, patient has no symptoms of low cortisol except weight loss (on mounjaro )-normal Ft4, prolactin IGF-1 (LH>FSH, 9.8>4.2) 06/2023 ACTH  stim test WNL:  Time 830  Specimen 3.7  Time. 900  Specimen 23.6  Time 1 930   SPECIMEN 2 30.7  04/2023 MRI HEAD WITHOUT AND WITH CONTRAST  Interval pituitary adenoma resection with enhancing soft  tissue in the sella as above, potentially reflecting a combination of residual normal pituitary tissue and postoperative changes. A small amount of residual tumor is not excluded, and continued follow-up is recommended. 12/2023 pituitary labs WNL, cortisol again low at 7.9 (at baseline though) but given normal ACTH  stim previously and no symptoms will continue to monitor  Ordered repeat MRI pituitary  Repeat lab in 12/2023  Return in about 8 months (around 01/11/2025) for visit and 8 am labs before next visit.   I spent more than 50% of today's visit counseling patient on symptoms, examination findings, lab findings, imaging results, treatment decisions and monitoring and prognosis. The patient understood the recommendations and agrees with the treatment plan. All questions regarding treatment plan were fully answered  Julia Birmingham, MD    History of Present Illness  Reports regular menstrual cycle No galactorrhea Still has migraines close to menstrual cycle No vision changes/issues   Initial history:Head aches once in a while No vision issues Saw ophthalmologist Denies any abdominal pain/nausea/vomiting Reports 81 lbs weight loss, on mounjaro  15 mg/week Reports good energy Got 7:30 am labs  Initial history:  Julia Kline is a 38 y.o. female referred by Dr. 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  Menstrual cycle stopped in Nov' 2023. No galactorrhea No vision issues No head aches/vomiting  She reports the following;  Headaches - visual blurring/ diplopia/ fields defect -  Galactorrhea-   change in facial appearance - body habitus - change in her hand, ring, hat, shoe size - hyperhidrosis - arthralgias -  fatigue - weight change - change in appetite - heat/cold intolerance - change in bowel movements - change in muscle strength - changes in skin or hair  - sweats  - palpitations  - insomnia  - tremor -   moon faces - fat pads - increased girth  - plethora hyperpigmentation -  purple striae - acne +  vellus/terminal hirsutism - proximal muscle weakness - nausea/vomiting - lightheadedness - abdominal pain -  change in libido +, decreased since a 06/2022 change in muscle strength/mass - change in body hair - hot flashes - night sweats -  Family history is negative for pituitary tumor or other abnormalities concerning for MEN syndrome.     On: 12/11/2022 05:36: images reviewed and interpreted independently  CLINICAL DATA:  Secondary amenorrhea   EXAM: MRI HEAD WITHOUT AND WITH CONTRAST   TECHNIQUE: Multiplanar, multiecho pulse sequences of the brain and surrounding structures were obtained without and with intravenous contrast.   CONTRAST:  10mL GADAVIST  GADOBUTROL  1 MMOL/ML IV SOLN   COMPARISON:  Head CT 07/26/2022   FINDINGS: Brain: 17 mm mass inseparable from the pituitary gland. The mass is primarily cystic but there is eccentric mural thickening/nodularity especially along the superior aspect. Craniocaudal extent is 17 mm with upward bulging and chiasmatic impaction. No cavernous sinus invasion.   No infarct, hemorrhage, hydrocephalus, or collection. Brain volume is normal.   Vascular: Normal flow voids and vascular enhancements.   Skull and upper cervical spine: Normal marrow signal.   Sinuses/Orbits: Leftward nasal septal deviation.   IMPRESSION: 17 mm solid and cystic pituitary mass compatible with adenoma. There is suprasellar involvement with chiasmatic impingement.    07/26/2022 CT HEAD No pathology. No comment on pituitary.   Physical Exam  BP 134/70   Pulse 84   Ht 5' 9 (1.753 m)   Wt 165 lb (74.8 kg)   SpO2 96%   BMI 24.37 kg/m    Constitutional: well developed, well nourished Head: normocephalic, atraumatic Eyes: sclera anicteric, no redness, intact temporal region although  patient seems confused when answering on the right side initially saying no and then yes Neck: supple Lungs: normal respiratory effort Neurology: alert and oriented Skin: dry, no appreciable rashes Musculoskeletal: no appreciable defects Psychiatric: normal mood and affect   Current Medications Patient's Medications  New Prescriptions   No medications on file  Previous Medications   ACETAMINOPHEN  (TYLENOL  PO)    Take by mouth as needed.   AMLODIPINE  (NORVASC ) 2.5 MG TABLET    Take 1 tablet by mouth once daily for blood pressure   CLINDAMYCIN  (CLEOCIN  T) 1 % LOTION    Apply topically 2 (two) times daily as needed. For hidradenitis suppurativa   NITROFURANTOIN , MACROCRYSTAL-MONOHYDRATE, (MACROBID ) 100 MG CAPSULE    Take 1 capsule (100 mg total) by mouth 2 (two) times daily.   RIZATRIPTAN  (MAXALT -MLT) 10 MG DISINTEGRATING TABLET    Take 1 tablet (10 mg total) by mouth as needed for migraine. May repeat in 2 hours if needed. Maximum 2 in one day   TIRZEPATIDE  (MOUNJARO ) 12.5 MG/0.5ML PEN    INJECT 12.5 MG INTO THE SKIN ONCE A WEEK FOR DIABETES   TOPIRAMATE  (TOPAMAX ) 50 MG TABLET    START BY TAKING 1 TABLET BY MOUTH ONCE DAILY AT BEDTIME FOR 7 DAYS THEN INCREASE TO 2 TABLETS  AT BEDTIME   VALSARTAN  (  DIOVAN ) 160 MG TABLET    Take 1 tablet by mouth once daily for blood pressure  Modified Medications   No medications on file  Discontinued Medications   No medications on file    Allergies Allergies  Allergen Reactions   Ancef  [Cefazolin ] Hives and Swelling    Facial and neck swelling; Had reaction after receiving fentanyl  and ancef  in the OR   Fentanyl  Hives and Swelling    Facial and neck swelling; Had reaction after receiving fentanyl  and ancef  in the OR   Shrimp [Shellfish Allergy] Swelling and Other (See Comments)    Swelling of throat   Chlorhexidine  Hives, Itching and Rash   Morphine And Codeine Rash   Pseudoephedrine Hcl Other (See Comments)    Light headed/ dizzy    Past  Medical History Past Medical History:  Diagnosis Date   Acute conjunctivitis of right eye 07/10/2019   Acute pain of right knee 08/06/2019   Anemia 10/14/2019   Complicated UTI (urinary tract infection) 03/10/2021   COVID 2020   had pneumonia   COVID-19    Diabetes mellitus without complication (HCC)    Eczema    Hernia cerebri (HCC)    Hidradenitis axillaris 04/20/2020   Followed by [facility]. Last seen 05/26/2020: instructed to wash b/l with Hibiclens  and take Doxycycline  as prescribed and follow up in one month.    History of COVID-19 12/21/2019   History of gestational diabetes 05/02/2017   Normal pp testing 10/2017   History of severe pre-eclampsia 05/02/2017   Hypertension    Impingement syndrome of left shoulder 02/16/2020   knee   Mastitis 06/18/2019   Migraine    PCOS (polycystic ovarian syndrome)    Pneumonia    Pregnancy induced hypertension    Skin lesions 09/12/2020    Past Surgical History Past Surgical History:  Procedure Laterality Date   CESAREAN SECTION  2017   Procedure: CESAREAN DELIVERY ONLY; Surgeon: Kalpen Navin Patel, MD; [facility]; Service: Obstetrics   CESAREAN SECTION N/A 10/02/2017   Procedure: CESAREAN SECTION;  Surgeon: Edsel Norleen GAILS, MD;  Location: [facility] SUIT[facility];  Service: Obstetrics;  Laterality: N/A;   CRANIOTOMY N/A 03/06/2023   Procedure: endoscopic endonasal resection of pituitary tumor;  Surgeon: Cheryle Debby LABOR, MD;  Location: [facility] OR;  Service: Neurosurgery;  Laterality: N/A;   DILATION AND EVACUATION N/A 03/06/2022   Procedure: DILATATION AND EVACUATION;  Surgeon: Lorence Ozell CROME, MD;  Location: [facility] OR;  Service: Gynecology;  Laterality: N/A;   I & D EXTREMITY Left 10/14/2020   Procedure: Irrigation and debridement left small and ring finger with repair of tendon;  Surgeon: Camella Fallow, MD;  Location: [facility] OR;  Service: Orthopedics;  Laterality: Left;  1 hr Block with IV Sed    LAPAROSCOPIC OOPHERECTOMY Left    LEEP     OVARIAN CYST REMOVAL     TONSILLECTOMY     As a child   TRANSPHENOIDAL APPROACH EXPOSURE N/A 03/06/2023   Procedure: TRANSPHENOIDAL APPROACH EXPOSURE;  Surgeon: Llewellyn Gerard LABOR, DO;  Location: [facility] OR;  Service: ENT;  Laterality: N/A;   WISDOM TOOTH EXTRACTION      Family History family history includes Asthma in her sister; COPD in her maternal grandfather; Diabetes in her father, maternal grandfather, maternal grandmother, mother, paternal grandfather, and paternal grandmother; Heart attack in her paternal grandfather; Hypertension in her father, maternal grandmother, paternal grandfather, and paternal grandmother; Migraines in her mother.  Social History Social History   Socioeconomic  History   Marital status: Married    Spouse name: Not on file   Number of children: Not on file   Years of education: Not on file   Highest education level: Master's degree (e.g., MA, MS, MEng, MEd, MSW, MBA)  Occupational History   Not on file  Tobacco Use   Smoking status: Never   Smokeless tobacco: Never  Vaping Use   Vaping status: Never Used  Substance and Sexual Activity   Alcohol use: No   Drug use: No   Sexual activity: Yes    Birth control/protection: None  Other Topics Concern   Not on file  Social History Narrative   Married.   2 children.   Works at [employer].   Social Drivers of Health   Financial Resource Strain: Patient Declined (03/05/2024)   Overall Financial Resource Strain (CARDIA)    Difficulty of Paying Living Expenses: Patient declined  Food Insecurity: Patient Declined (03/05/2024)   Hunger Vital Sign    Worried About Running Out of Food in the Last Year: Patient declined    Ran Out of Food in the Last Year: Patient declined  Transportation Needs: Unmet Transportation Needs (03/05/2024)   PRAPARE - Administrator, Civil Service (Medical): Yes    Lack of Transportation (Non-Medical): No  Physical  Activity: Sufficiently Active (03/05/2024)   Exercise Vital Sign    Days of Exercise per Week: 4 days    Minutes of Exercise per Session: 50 min  Stress: No Stress Concern Present (03/05/2024)   [employer] of Occupational Health - Occupational Stress Questionnaire    Feeling of Stress: Not at all  Social Connections: Unknown (03/05/2024)   Social Connection and Isolation Panel    Frequency of Communication with Friends and Family: More than three times a week    Frequency of Social Gatherings with Friends and Family: Once a week    Attends Religious Services: More than 4 times per year    Active Member of Clubs or Organizations: Yes    Attends Engineer, structural: More than 4 times per year    Marital Status: Patient declined  Intimate Partner Violence: Not At Risk (03/08/2023)   Humiliation, Afraid, Rape, and Kick questionnaire    Fear of Current or Ex-Partner: No    Emotionally Abused: No    Physically Abused: No    Sexually Abused: No    Lab Results  Component Value Date   TSH 0.97 01/16/2024   TSH 0.98 12/21/2022   FSH 7.8 01/16/2024   FSH 1.9 12/21/2022   LH 4.1 01/16/2024   LH 1.02 12/21/2022   PROLACTIN 4.6 01/16/2024   PROLACTIN 39.6 (H) 11/21/2022   PROLACTIN,UNDILUTED 47.6 (H) 12/21/2022   CORTISOL PLASMA 7.5 01/16/2024   CORTISOL PLASMA 7.9 12/21/2022   GROWTH HORMONE 0.1 01/16/2024   GROWTH HORMONE 0.1 12/21/2022   ESTRADIOL  17 01/16/2024   ESTRADIOL  61 12/21/2022   FREE T4 1.4 01/16/2024   FREE T4 0.65 12/21/2022    Lab Results  Component Value Date   CHOL 143 12/24/2023   Lab Results  Component Value Date   HDL 32.60 (L) 12/24/2023   Lab Results  Component Value Date   LDLCALC 93 12/24/2023   Lab Results  Component Value Date   TRIG 87.0 12/24/2023   Lab Results  Component Value Date   CHOLHDL 4 12/24/2023   Lab Results  Component Value Date   CREATININE 0.83 01/16/2024   Lab Results  Component  Value Date   GFR 99.56  12/24/2023      Component Value Date/Time   NA 139 01/16/2024 0813   NA 145 (H) 10/09/2017 1500   K 4.0 01/16/2024 0813   CL 108 01/16/2024 0813   CO2 23 01/16/2024 0813   GLUCOSE 79 01/16/2024 0813   BUN 14 01/16/2024 0813   BUN 8 10/09/2017 1500   CREATININE 0.83 01/16/2024 0813   CALCIUM 9.6 01/16/2024 0813   PROT 6.9 12/24/2023 1536   PROT 5.7 (L) 10/09/2017 1500   ALBUMIN  4.2 12/24/2023 1536   ALBUMIN  2.7 (L) 10/09/2017 1500   AST 13 12/24/2023 1536   ALT 11 12/24/2023 1536   ALKPHOS 54 12/24/2023 1536   BILITOT 0.5 12/24/2023 1536   BILITOT <0.2 10/09/2017 1500   GFRNONAA >60 03/12/2023 0843   GFRAA >60 03/25/2020 1513      Latest Ref Rng & Units 01/16/2024    8:13 AM 12/24/2023    3:36 PM 03/12/2023    8:43 AM  BMP  Glucose 65 - 99 mg/dL 79  78  761   BUN 7 - 25 mg/dL 14  12  9    Creatinine 0.50 - 0.97 mg/dL 9.16  9.23  9.23   BUN/Creat Ratio 6 - 22 (calc) SEE NOTE:     Sodium 135 - 146 mmol/L 139  137  128   Potassium 3.5 - 5.3 mmol/L 4.0  3.9  3.9   Chloride 98 - 110 mmol/L 108  106  91   CO2 20 - 32 mmol/L 23  24  27    Calcium 8.6 - 10.2 mg/dL 9.6  9.1  8.4        Component Value Date/Time   WBC 12.0 (H) 03/06/2023 1518   RBC 4.74 03/06/2023 1518   HGB 10.6 (L) 03/06/2023 1518   HGB 9.9 (L) 10/13/2019 1605   HGB 10.8 04/01/2017 0000   HCT 35.1 (L) 03/06/2023 1518   HCT 31.7 (L) 10/13/2019 1605   HCT 34 04/01/2017 0000   PLT 244 03/06/2023 1518   PLT 296 10/13/2019 1605   PLT 252 04/01/2017 0000   MCV 74.1 (L) 03/06/2023 1518   MCV 72 (L) 10/13/2019 1605   MCH 22.4 (L) 03/06/2023 1518   MCHC 30.2 03/06/2023 1518   RDW 14.7 03/06/2023 1518   RDW 14.3 10/13/2019 1605   LYMPHSABS 2.4 06/25/2022 2145   MONOABS 0.8 06/25/2022 2145   EOSABS 0.1 06/25/2022 2145   BASOSABS 0.1 06/25/2022 2145     Parts of this note may have been dictated using voice recognition software. There may be variances in spelling and vocabulary which are unintentional. Not all  errors are proofread. Please notify the dino if any discrepancies are noted or if the meaning of any statement is not clear.

## 2024-05-15 NOTE — Addendum Note (Signed)
 Addended by: Rosealee Recinos on: 05/15/2024 09:16 AM   Modules accepted: Orders

## 2024-05-22 ENCOUNTER — Ambulatory Visit (HOSPITAL_COMMUNITY)
Admission: RE | Admit: 2024-05-22 | Discharge: 2024-05-22 | Disposition: A | Discharge status: Home / Self Care | Source: Ambulatory Visit | Attending: "Endocrinology | Admitting: "Endocrinology

## 2024-05-22 DIAGNOSIS — D352 Benign neoplasm of pituitary gland: Secondary | ICD-10-CM | POA: Diagnosis present

## 2024-05-22 MED ORDER — GADOBUTROL 1 MMOL/ML IV SOLN
7.0000 mL | Freq: Once | INTRAVENOUS | Status: AC | PRN
  Administered 2024-05-22: 7 mL via INTRAVENOUS

## 2024-06-09 ENCOUNTER — Ambulatory Visit: Admitting: Neurosurgery

## 2024-06-09 VITALS — BP 123/87 | HR 91 | Ht 69.0 in | Wt 162.0 lb

## 2024-06-09 DIAGNOSIS — D352 Benign neoplasm of pituitary gland: Secondary | ICD-10-CM | POA: Diagnosis not present

## 2024-06-09 NOTE — Progress Notes (Signed)
 Assessment : Discussed the use of AI scribe software for clinical note transcription with the patient, who gave verbal consent to proceed.  History of Present Illness Julia Kline is a 38 year old female with a history of pituitary gland tumor who presents for follow-up after surgery. She was referred by her OBGYN after blood work showed elevated hormone levels.  Last year, she was diagnosed with a pituitary gland tumor after elevated hormone levels were found in her blood work. She underwent surgery on June 24 of last year, and the tumor was noncancerous.  She is currently under the care of Dr. Dartha, and at her last visit, she was told that her blood levels, including cortisone, were fine. She is not taking any hormone medications orally.  She experiences mild headaches occasionally, particularly around her menstrual cycle, describing them as 'small mild migraines' occurring a week before or after her cycle.  She had a follow-up MRI at the beginning of October and is here to review the results. She reports no nosebleeds and no significant headaches outside of the mild migraines associated with her menstrual cycle.    Plan : I am really glad that she is doing so well.  I reviewed the preoperative MRI with the aunt recent MRI with her and her mom and I congratulated her that there has been a complete resection.  We talked about the options and I told her that if she was my wife, I would recommend a 2-year follow-up.  I think that the likelihood of a recurrence within 1 year is extremely low of this benign pituitary adenoma.  Fortunately she does not have any hormonal deficits and is not on any supplementation.  I told her that if she starts having any headaches or double vision to let me know and we will image her sooner.   Social History   Socioeconomic History   Marital status: Married    Spouse name: Not on file   Number of children: Not on file   Years of education:  Not on file   Highest education level: Master's degree (e.g., MA, MS, MEng, MEd, MSW, MBA)  Occupational History   Not on file  Tobacco Use   Smoking status: Never   Smokeless tobacco: Never  Vaping Use   Vaping status: Never Used  Substance and Sexual Activity   Alcohol use: No   Drug use: No   Sexual activity: Yes    Birth control/protection: None  Other Topics Concern   Not on file  Social History Narrative   Married.   2 children.   Works at [employer].   Social Drivers of Health   Financial Resource Strain: Patient Declined (03/05/2024)   Overall Financial Resource Strain (CARDIA)    Difficulty of Paying Living Expenses: Patient declined  Food Insecurity: Patient Declined (03/05/2024)   Hunger Vital Sign    Worried About Running Out of Food in the Last Year: Patient declined    Ran Out of Food in the Last Year: Patient declined  Transportation Needs: Unmet Transportation Needs (03/05/2024)   PRAPARE - Administrator, Civil Service (Medical): Yes    Lack of Transportation (Non-Medical): No  Physical Activity: Sufficiently Active (03/05/2024)   Exercise Vital Sign    Days of Exercise per Week: 4 days    Minutes of Exercise per Session: 50 min  Stress: No Stress Concern Present (03/05/2024)   [employer] of Occupational Health - Occupational Stress Questionnaire  Feeling of Stress: Not at all  Social Connections: Unknown (03/05/2024)   Social Connection and Isolation Panel    Frequency of Communication with Friends and Family: More than three times a week    Frequency of Social Gatherings with Friends and Family: Once a week    Attends Religious Services: More than 4 times per year    Active Member of [employer] or Organizations: Yes    Attends Banker Meetings: More than 4 times per year    Marital Status: Patient declined  Intimate Partner Violence: Not At Risk (03/08/2023)   Humiliation, Afraid, Rape, and Kick questionnaire    Fear of  Current or Ex-Partner: No    Emotionally Abused: No    Physically Abused: No    Sexually Abused: No    Family History  Problem Relation Age of Onset   Diabetes Mother    Migraines Mother    Diabetes Father    Hypertension Father    Hypertension Maternal Grandmother    Diabetes Maternal Grandmother    Diabetes Maternal Grandfather    COPD Maternal Grandfather    Diabetes Paternal Grandmother    Hypertension Paternal Grandmother    Diabetes Paternal Grandfather    Hypertension Paternal Grandfather    Heart attack Paternal Grandfather    Asthma Sister     Allergies  Allergen Reactions   Ancef  [Cefazolin ] Hives and Swelling    Facial and neck swelling; Had reaction after receiving fentanyl  and ancef  in the OR   Fentanyl  Hives and Swelling    Facial and neck swelling; Had reaction after receiving fentanyl  and ancef  in the OR   Shrimp [Shellfish Allergy] Swelling and Other (See Comments)    Swelling of throat   Chlorhexidine  Hives, Itching and Rash   Morphine And Codeine Rash   Pseudoephedrine Hcl Other (See Comments)    Light headed/ dizzy    Past Medical History:  Diagnosis Date   Acute conjunctivitis of right eye 07/10/2019   Acute pain of right knee 08/06/2019   Anemia 10/14/2019   Complicated UTI (urinary tract infection) 03/10/2021   COVID 2020   had pneumonia   COVID-19    Diabetes mellitus without complication (HCC)    Eczema    Hernia cerebri (HCC)    Hidradenitis axillaris 04/20/2020   Followed by [facility]. Last seen 05/26/2020: instructed to wash b/l with Hibiclens  and take Doxycycline  as prescribed and follow up in one month.    History of COVID-19 12/21/2019   History of gestational diabetes 05/02/2017   Normal pp testing 10/2017   History of severe pre-eclampsia 05/02/2017   Hypertension    Impingement syndrome of left shoulder 02/16/2020   knee   Mastitis 06/18/2019   Migraine    PCOS (polycystic ovarian syndrome)    Pneumonia     Pregnancy induced hypertension    Skin lesions 09/12/2020    Past Surgical History:  Procedure Laterality Date   CESAREAN SECTION  2017   Procedure: CESAREAN DELIVERY ONLY; Surgeon: Kalpen Navin Patel, MD; Location: C[facility]SECTION SUITE [facility]; Service: Obstetrics   CESAREAN SECTION N/A 10/02/2017   Procedure: CESAREAN SECTION;  Surgeon: Edsel Norleen GAILS, MD;  Location: [facility] BIRTHING SUITES;  Service: Obstetrics;  Laterality: N/A;   CRANIOTOMY N/A 03/06/2023   Procedure: endoscopic endonasal resection of pituitary tumor;  Surgeon: Cheryle Debby LABOR, MD;  Location: [facility] OR;  Service: Neurosurgery;  Laterality: N/A;   DILATION AND EVACUATION N/A 03/06/2022   Procedure: DILATATION AND EVACUATION;  Surgeon:  Ervin, Michael L, MD;  Location: [facility] OR;  Service: Gynecology;  Laterality: N/A;   I & D EXTREMITY Left 10/14/2020   Procedure: Irrigation and debridement left small and ring finger with repair of tendon;  Surgeon: Camella Fallow, MD;  Location: [facility] OR;  Service: Orthopedics;  Laterality: Left;  1 hr Block with IV Sed   LAPAROSCOPIC OOPHERECTOMY Left    LEEP     OVARIAN CYST REMOVAL     TONSILLECTOMY     As a child   TRANSPHENOIDAL APPROACH EXPOSURE N/A 03/06/2023   Procedure: TRANSPHENOIDAL APPROACH EXPOSURE;  Surgeon: Llewellyn Gerard LABOR, DO;  Location: [facility] OR;  Service: ENT;  Laterality: N/A;   WISDOM TOOTH EXTRACTION       Physical Exam HENT:     Head: Normocephalic.     Nose: Nose normal.  Eyes:     Pupils: Pupils are equal, round, and reactive to light.  Cardiovascular:     Rate and Rhythm: Normal rate.  Pulmonary:     Effort: Pulmonary effort is normal.  Abdominal:     General: Abdomen is flat.  Musculoskeletal:     Cervical back: Normal range of motion.  Neurological:     Mental Status: She is alert.     Cranial Nerves: Cranial nerves 2-12 are intact.     Sensory: Sensation is intact.     Motor: Motor function is intact.     Coordination: Coordination is intact.         Results for orders placed or performed in visit on 05/14/24  MR Brain W Wo Contrast   Narrative   CLINICAL DATA:  Pituitary adenoma resection  EXAM: MRI HEAD WITHOUT AND WITH CONTRAST  TECHNIQUE: Multiplanar, multiecho pulse sequences of the brain and surrounding structures were obtained without and with intravenous contrast.  CONTRAST:  7mL GADAVIST  GADOBUTROL  1 MMOL/ML IV SOLN  COMPARISON:  May 14, 2023  FINDINGS: MRI brain:  Previous pituitary surgery. There is a 4 mm area of decreased enhancement in the left side of the gland. The gland is otherwise normal. No suprasellar mass. The optic chiasm is normal. The cavernous sinuses are normal.  The signal in the brain parenchyma is normal.  No abnormal enhancement.  There is no acute or chronic infarct.  The ventricles are normal.  No mass lesion.  There are normal flow signals in the carotid arteries and basilar artery.  No significant bone marrow signal abnormality.  No significant abnormality in the paranasal sinuses or soft tissues.  IMPRESSION: Previous pituitary surgery. 4 mm cystic appearing area in the left side of the gland.  Otherwise normal.   Electronically Signed   By: Nancyann Burns M.D.   On: 05/25/2024 10:53   Results for orders placed or performed during the hospital encounter of 07/26/22  CT Head Wo Contrast   Narrative   CLINICAL DATA:  Dizziness, hypertension  EXAM: CT HEAD WITHOUT CONTRAST  TECHNIQUE: Contiguous axial images were obtained from the base of the skull through the vertex without intravenous contrast.  RADIATION DOSE REDUCTION: This exam was performed according to the departmental dose-optimization program which includes automated exposure control, adjustment of the mA and/or kV according to patient size and/or use of iterative reconstruction technique.  COMPARISON:  07/24/2014  FINDINGS: Brain: No acute intracranial findings are seen. Ventricles are  not dilated. There are no signs of bleeding within the cranium. Cortical sulci are prominent.  Vascular: Unremarkable.  Skull: Unremarkable.  Sinuses/Orbits: Unremarkable.  Other: No significant interval  changes are noted.  IMPRESSION: No acute intracranial findings are seen in noncontrast CT brain.   Electronically Signed   By: Gearldine Mary M.D.   On: 07/26/2022 16:55

## 2024-06-17 ENCOUNTER — Other Ambulatory Visit: Payer: Self-pay | Admitting: Primary Care

## 2024-06-17 DIAGNOSIS — E1165 Type 2 diabetes mellitus with hyperglycemia: Secondary | ICD-10-CM

## 2024-06-18 ENCOUNTER — Ambulatory Visit: Payer: Self-pay | Admitting: "Endocrinology

## 2024-06-25 ENCOUNTER — Ambulatory Visit: Payer: Self-pay | Admitting: Primary Care

## 2024-06-25 ENCOUNTER — Encounter: Payer: Self-pay | Admitting: Primary Care

## 2024-06-25 ENCOUNTER — Ambulatory Visit: Admitting: Primary Care

## 2024-06-25 VITALS — BP 116/80 | HR 102 | Temp 98.1°F | Ht 69.0 in | Wt 161.0 lb

## 2024-06-25 DIAGNOSIS — I10 Essential (primary) hypertension: Secondary | ICD-10-CM | POA: Diagnosis not present

## 2024-06-25 DIAGNOSIS — Z7984 Long term (current) use of oral hypoglycemic drugs: Secondary | ICD-10-CM | POA: Diagnosis not present

## 2024-06-25 DIAGNOSIS — E1165 Type 2 diabetes mellitus with hyperglycemia: Secondary | ICD-10-CM

## 2024-06-25 LAB — POCT GLYCOSYLATED HEMOGLOBIN (HGB A1C): Hemoglobin A1C: 5 % (ref 4.0–5.6)

## 2024-06-25 NOTE — Assessment & Plan Note (Addendum)
 Well-controlled!  Commended her on weight loss.  Stop amlodipine  2.5 mg daily. Remain off valsartan  160 mg daily.

## 2024-06-25 NOTE — Assessment & Plan Note (Addendum)
 Well controlled with A1C of 5.0 today!  Continue Mounjaro  12.5 mg weekly for weight loss assistance.    Foot exam today. She cannot provide urine sample for urine microalbumin.  Follow up in 6 months.

## 2024-06-25 NOTE — Patient Instructions (Signed)
 Stop amlodipine  for blood pressure. Remain off of valsartan  for blood pressure.  Monitor your blood pressure as discussed and notify me if you start seeing numbers greater than 130/80.  Please schedule a physical to meet with me in 6 months.   It was a pleasure to see you today!

## 2024-06-25 NOTE — Progress Notes (Signed)
 Subjective:    Patient ID: Julia Kline, female    DOB: November 16, 1985, 38 y.o.   MRN: [id number]  Julia Kline is a very pleasant 38 y.o. female with a history of hypertension, type 2 diabetes, migraines, obesity who presents today for follow-up of diabetes and hypertension  1) Type 2 Diabetes:  Current medications include: Mounjaro  12.5 mg weekly  She is checking her blood glucose 0 times daily.  Last A1C: 5.5 in May 2025, 5.0 today Last Eye Exam: Up-to-date Last Foot Exam: Due Pneumonia Vaccination: Never completed Urine Microalbumin: Due Statin: None  Dietary changes since last visit: She is monitoring carbs, limited breads and pastas. Mostly eating protein, veggies, and fruit.    Exercise: Active some walking   Wt Readings from Last 3 Encounters:  06/25/24 161 lb (73 kg)  06/09/24 162 lb (73.5 kg)  05/14/24 165 lb (74.8 kg)   BP Readings from Last 3 Encounters:  06/25/24 116/80  06/09/24 123/87  05/14/24 134/70   2) Hypertension: Currently prescribed amlodipine  2.5 mg daily and valsartan  160 mg daily. Over the last few months she's been taking only one of either BP pill daily, she chooses which one. If she takes) she will become too sleepy.  She went on valsartan  1 month ago.  Has not been taking amlodipine  2.5 mg daily.  She denies dizziness.  She prefers to stop amlodipine  as well.  She is not monitoring blood pressure right now but she does have a cuff at home.   Review of Systems  Gastrointestinal:  Negative for abdominal pain, constipation, diarrhea and nausea.  Neurological:  Negative for numbness.         Past Medical History:  Diagnosis Date   Acute conjunctivitis of right eye 07/10/2019   Acute pain of right knee 08/06/2019   Anemia 10/14/2019   Complicated UTI (urinary tract infection) 03/10/2021   COVID 2020   had pneumonia   COVID-19    Diabetes mellitus without complication (HCC)    Eczema    Hernia cerebri (HCC)     Hidradenitis axillaris 04/20/2020   Followed by [facility]. Last seen 05/26/2020: instructed to wash b/l with Hibiclens  and take Doxycycline  as prescribed and follow up in one month.    History of COVID-19 12/21/2019   History of gestational diabetes 05/02/2017   Normal pp testing 10/2017   History of severe pre-eclampsia 05/02/2017   Hypertension    Impingement syndrome of left shoulder 02/16/2020   knee   Mastitis 06/18/2019   Migraine    PCOS (polycystic ovarian syndrome)    Pneumonia    Pregnancy induced hypertension    Skin lesions 09/12/2020    Social History   Socioeconomic History   Marital status: Married    Spouse name: Not on file   Number of children: Not on file   Years of education: Not on file   Highest education level: Master's degree (e.g., MA, MS, MEng, MEd, MSW, MBA)  Occupational History   Not on file  Tobacco Use   Smoking status: Never   Smokeless tobacco: Never  Vaping Use   Vaping status: Never Used  Substance and Sexual Activity   Alcohol use: No   Drug use: No   Sexual activity: Yes    Birth control/protection: None  Other Topics Concern   Not on file  Social History Narrative   Married.   2 children.   Works at [employer].   Social Drivers of Health  Financial Resource Strain: Patient Declined (06/22/2024)   Overall Financial Resource Strain (CARDIA)    Difficulty of Paying Living Expenses: Patient declined  Food Insecurity: Patient Declined (06/22/2024)   Hunger Vital Sign    Worried About Running Out of Food in the Last Year: Patient declined    Ran Out of Food in the Last Year: Patient declined  Transportation Needs: Patient Declined (06/22/2024)   PRAPARE - Administrator, Civil Service (Medical): Patient declined    Lack of Transportation (Non-Medical): Patient declined  Physical Activity: Unknown (06/22/2024)   Exercise Vital Sign    Days of Exercise per Week: Patient declined    Minutes of  Exercise per Session: Not on file  Stress: No Stress Concern Present (06/22/2024)   [employer] of Occupational Health - Occupational Stress Questionnaire    Feeling of Stress: Not at all  Social Connections: Unknown (06/22/2024)   Social Connection and Isolation Panel    Frequency of Communication with Friends and Family: Patient declined    Frequency of Social Gatherings with Friends and Family: Patient declined    Attends Religious Services: Patient declined    Active Member of Clubs or Organizations: Patient declined    Attends Banker Meetings: Not on file    Marital Status: Married  Intimate Partner Violence: Not At Risk (03/08/2023)   Humiliation, Afraid, Rape, and Kick questionnaire    Fear of Current or Ex-Partner: No    Emotionally Abused: No    Physically Abused: No    Sexually Abused: No    Past Surgical History:  Procedure Laterality Date   CESAREAN SECTION  2017   Procedure: CESAREAN DELIVERY ONLY; Surgeon: Kalpen Navin Patel, MD; Location: C[facility]SECTION SUITE [facility]; Service: Obstetrics   CESAREAN SECTION N/A 10/02/2017   Procedure: CESAREAN SECTION;  Surgeon: Edsel Norleen GAILS, MD;  Location: [facility]IRTH[facility] SUIT[facility];  Service: Obstetrics;  Laterality: N/A;   CRANIOTOMY N/A 03/06/2023   Procedure: endoscopic endonasal resection of pituitary tumor;  Surgeon: Cheryle Debby LABOR, MD;  Location: [facility] OR;  Service: Neurosurgery;  Laterality: N/A;   DILATION AND EVACUATION N/A 03/06/2022   Procedure: DILATATION AND EVACUATION;  Surgeon: Lorence Ozell CROME, MD;  Location: [facility] OR;  Service: Gynecology;  Laterality: N/A;   I & D EXTREMITY Left 10/14/2020   Procedure: Irrigation and debridement left small and ring finger with repair of tendon;  Surgeon: Camella Fallow, MD;  Location: [facility] OR;  Service: Orthopedics;  Laterality: Left;  1 hr Block with IV Sed   LAPAROSCOPIC OOPHERECTOMY Left    LEEP     OVARIAN CYST REMOVAL     TONSILLECTOMY     As a child   TRANSPHENOIDAL  APPROACH EXPOSURE N/A 03/06/2023   Procedure: TRANSPHENOIDAL APPROACH EXPOSURE;  Surgeon: Llewellyn Gerard LABOR, DO;  Location: [facility] OR;  Service: ENT;  Laterality: N/A;   WISDOM TOOTH EXTRACTION      Family History  Problem Relation Age of Onset   Diabetes Mother    Migraines Mother    Diabetes Father    Hypertension Father    Hypertension Maternal Grandmother    Diabetes Maternal Grandmother    Diabetes Maternal Grandfather    COPD Maternal Grandfather    Diabetes Paternal Grandmother    Hypertension Paternal Grandmother    Diabetes Paternal Grandfather    Hypertension Paternal Grandfather    Heart attack Paternal Grandfather    Asthma Sister     Allergies  Allergen Reactions   Ancef  [Cefazolin ] Hives and  Swelling    Facial and neck swelling; Had reaction after receiving fentanyl  and ancef  in the OR   Fentanyl  Hives and Swelling    Facial and neck swelling; Had reaction after receiving fentanyl  and ancef  in the OR   Shrimp [Shellfish Allergy] Swelling and Other (See Comments)    Swelling of throat   Chlorhexidine  Hives, Itching and Rash   Morphine And Codeine Rash   Pseudoephedrine Hcl Other (See Comments)    Light headed/ dizzy    Current Outpatient Medications on File Prior to Visit  Medication Sig Dispense Refill   Acetaminophen  (TYLENOL  PO) Take by mouth as needed.     clindamycin  (CLEOCIN  T) 1 % lotion Apply topically 2 (two) times daily as needed. For hidradenitis suppurativa 60 mL 0   Multiple Vitamins-Minerals (MULTIVITAMIN WOMEN PO) Take by mouth daily.     rizatriptan  (MAXALT -MLT) 10 MG disintegrating tablet Take 1 tablet (10 mg total) by mouth as needed for migraine. May repeat in 2 hours if needed. Maximum 2 in one day 9 tablet 11   tirzepatide  (MOUNJARO ) 12.5 MG/0.5ML Pen Inject 12.5 mg into the skin once a week. for diabetes. 6 mL 0   topiramate  (TOPAMAX ) 50 MG tablet START BY TAKING 1 TABLET BY MOUTH ONCE DAILY AT BEDTIME FOR 7 DAYS THEN INCREASE TO 2 TABLETS   AT BEDTIME 60 tablet 6   No current facility-administered medications on file prior to visit.    BP 116/80   Pulse (!) 102   Temp 98.1 F (36.7 C) (Oral)   Ht 5' 9 (1.753 m)   Wt 161 lb (73 kg)   LMP 06/24/2024   SpO2 97%   BMI 23.78 kg/m  Objective:   Physical Exam Cardiovascular:     Rate and Rhythm: Normal rate and regular rhythm.  Pulmonary:     Effort: Pulmonary effort is normal.     Breath sounds: Normal breath sounds.  Musculoskeletal:     Cervical back: Neck supple.  Skin:    General: Skin is warm and dry.  Neurological:     Mental Status: She is alert and oriented to person, place, and time.  Psychiatric:        Mood and Affect: Mood normal.     Physical Exam        Assessment & Plan:  Type 2 diabetes mellitus with hyperglycemia, without long-term current use of insulin  (HCC) Assessment & Plan: Well controlled with A1C of 5.0 today!  Continue Mounjaro  12.5 mg weekly for weight loss assistance.    Foot exam today. She cannot provide urine sample for urine microalbumin.  Follow up in 6 months.  Orders: -     POCT glycosylated hemoglobin (Hb A1C)  Primary hypertension Assessment & Plan: Well-controlled!  Commended her on weight loss.  Stop amlodipine  2.5 mg daily. Remain off valsartan  160 mg daily.      Assessment and Plan Assessment & Plan         Comer MARLA Gaskins, NP     History of Present Illness

## 2024-08-03 DIAGNOSIS — Z87898 Personal history of other specified conditions: Secondary | ICD-10-CM

## 2024-08-04 MED ORDER — SCOPOLAMINE 1 MG/3DAYS TD PT72: 1.0000 | MEDICATED_PATCH | TRANSDERMAL | 0 refills | Status: AC

## 2024-08-24 DIAGNOSIS — L732 Hidradenitis suppurativa: Secondary | ICD-10-CM

## 2024-08-24 MED ORDER — CLINDAMYCIN PHOSPHATE 1 % EX LOTN: TOPICAL_LOTION | Freq: Two times a day (BID) | CUTANEOUS | 0 refills | Status: AC | PRN

## 2024-09-08 ENCOUNTER — Encounter: Payer: Self-pay | Admitting: Adult Health

## 2024-09-08 ENCOUNTER — Ambulatory Visit: Admitting: Adult Health

## 2024-09-08 DIAGNOSIS — G43711 Chronic migraine without aura, intractable, with status migrainosus: Secondary | ICD-10-CM | POA: Diagnosis not present

## 2024-09-08 MED ORDER — RIZATRIPTAN BENZOATE 10 MG PO TBDP: 10.0000 mg | ORAL_TABLET | ORAL | 11 refills | Status: AC | PRN

## 2024-09-08 NOTE — Progress Notes (Signed)
 "   PATIENT: Julia Kline DOB: 1986/05/12  REASON FOR VISIT: follow up HISTORY FROM: patient  Chief Complaint  Patient presents with   RM 4     Patient is here alone for Migraines and medication follow-up - would like to discuss Topamax  she had to decrease medication to come off of it due to symptoms. She also wanted to discuss who her new doctor will be since Ines is gone.       HISTORY OF PRESENT ILLNESS: Today 09/08/24   Julia Kline is a 39 y.o. female who has been followed in this office for Migraine headaches. Returns today for follow-up.  She reports that she stopped Topamax  about 3 days ago because it was causing cognitive slowing.  So far she has tolerated being off the medication well.  She had a migraine prior to coming off the medication but it resolved within 10 minutes with rizatriptan .  She reports that she typically gets 2-3 migraines a month typically around her menstrual cycle.  Denies aura.  Reports photophobia.  She returns today for an evaluation.   REVIEW OF SYSTEMS: Out of a complete 14 system review of symptoms, the patient complains only of the following symptoms, and all other reviewed systems are negative.   Listed in HPI  ALLERGIES: Allergies[1]  HOME MEDICATIONS: Outpatient Medications Prior to Visit  Medication Sig Dispense Refill   Acetaminophen  (TYLENOL  PO) Take by mouth as needed.     clindamycin  (CLEOCIN  T) 1 % lotion Apply topically 2 (two) times daily as needed. For hidradenitis suppurativa 60 mL 0   Multiple Vitamins-Minerals (MULTIVITAMIN WOMEN PO) Take by mouth daily.     rizatriptan  (MAXALT -MLT) 10 MG disintegrating tablet Take 1 tablet (10 mg total) by mouth as needed for migraine. May repeat in 2 hours if needed. Maximum 2 in one day 9 tablet 11   tirzepatide  (MOUNJARO ) 12.5 MG/0.5ML Pen Inject 12.5 mg into the skin once a week. for diabetes. 6 mL 0   scopolamine  (TRANSDERM-SCOP) 1 MG/3DAYS Place 1 patch (1 mg total) onto  the skin every 3 (three) days. Apply 4 hours prior to cruise (Patient not taking: Reported on 09/08/2024) 10 patch 0   topiramate  (TOPAMAX ) 50 MG tablet START BY TAKING 1 TABLET BY MOUTH ONCE DAILY AT BEDTIME FOR 7 DAYS THEN INCREASE TO 2 TABLETS  AT BEDTIME (Patient not taking: Reported on 09/08/2024) 60 tablet 6   No facility-administered medications prior to visit.    PAST MEDICAL HISTORY: Past Medical History:  Diagnosis Date   Acute conjunctivitis of right eye 07/10/2019   Acute pain of right knee 08/06/2019   Anemia 10/14/2019   Complicated UTI (urinary tract infection) 03/10/2021   COVID 2020   had pneumonia   COVID-19    Diabetes mellitus without complication (HCC)    Eczema    Hernia cerebri (HCC)    Hidradenitis axillaris 04/20/2020   Followed by [facility]. Last seen 05/26/2020: instructed to wash b/l with Hibiclens  and take Doxycycline  as prescribed and follow up in one month.    History of COVID-19 12/21/2019   History of gestational diabetes 05/02/2017   Normal pp testing 10/2017   History of severe pre-eclampsia 05/02/2017   Hypertension    Impingement syndrome of left shoulder 02/16/2020   knee   Mastitis 06/18/2019   Migraine    PCOS (polycystic ovarian syndrome)    Pneumonia    Pregnancy induced hypertension    Skin lesions 09/12/2020    PAST  SURGICAL HISTORY: Past Surgical History:  Procedure Laterality Date   CESAREAN SECTION  2017   Procedure: CESAREAN DELIVERY ONLY; Surgeon: Karole Deno Blanch, MD; [facility]; Service: Obstetrics   CESAREAN SECTION N/A 10/02/2017   Procedure: CESAREAN SECTION;  Surgeon: Edsel Norleen GAILS, MD;  Location: [facility];  Service: Obstetrics;  Laterality: N/A;   CRANIOTOMY N/A 03/06/2023   Procedure: endoscopic endonasal resection of pituitary tumor;  Surgeon: Cheryle Debby LABOR, MD;  Location: [facility] OR;  Service: Neurosurgery;  Laterality: N/A;   DILATION AND EVACUATION N/A 03/06/2022    Procedure: DILATATION AND EVACUATION;  Surgeon: Lorence Ozell CROME, MD;  Location: [facility] OR;  Service: Gynecology;  Laterality: N/A;   I & D EXTREMITY Left 10/14/2020   Procedure: Irrigation and debridement left small and ring finger with repair of tendon;  Surgeon: Camella Fallow, MD;  Location: [facility] OR;  Service: Orthopedics;  Laterality: Left;  1 hr Block with IV Sed   LAPAROSCOPIC OOPHERECTOMY Left    LEEP     OVARIAN CYST REMOVAL     TONSILLECTOMY     As a child   TRANSPHENOIDAL APPROACH EXPOSURE N/A 03/06/2023   Procedure: TRANSPHENOIDAL APPROACH EXPOSURE;  Surgeon: Llewellyn Gerard LABOR, DO;  Location: [facility] OR;  Service: ENT;  Laterality: N/A;   WISDOM TOOTH EXTRACTION      FAMILY HISTORY: Family History  Problem Relation Age of Onset   Diabetes Mother    Migraines Mother    Diabetes Father    Hypertension Father    Asthma Sister    Hypertension Maternal Grandmother    Diabetes Maternal Grandmother    Diabetes Maternal Grandfather    COPD Maternal Grandfather    Stroke Paternal Grandmother    Diabetes Paternal Grandmother    Hypertension Paternal Grandmother    Diabetes Paternal Grandfather    Hypertension Paternal Grandfather    Heart attack Paternal Grandfather    Seizures Neg Hx    Sleep apnea Neg Hx     SOCIAL HISTORY: Social History   Socioeconomic History   Marital status: Married    Spouse name: Not on file   Number of children: Not on file   Years of education: Not on file   Highest education level: Master's degree (e.g., MA, MS, MEng, MEd, MSW, MBA)  Occupational History   Not on file  Tobacco Use   Smoking status: Never   Smokeless tobacco: Never  Vaping Use   Vaping status: Never Used  Substance and Sexual Activity   Alcohol use: No   Drug use: No   Sexual activity: Yes    Birth control/protection: None  Other Topics Concern   Not on file  Social History Narrative   Married.   2 children.   Works at [employer].   No caffeine    Social  Drivers of Health   Tobacco Use: Low Risk (09/08/2024)   Patient History    Smoking Tobacco Use: Never    Smokeless Tobacco Use: Never    Passive Exposure: Not on file  Financial Resource Strain: Patient Declined (06/22/2024)   Overall Financial Resource Strain (CARDIA)    Difficulty of Paying Living Expenses: Patient declined  Food Insecurity: Patient Declined (06/22/2024)   Epic    Worried About Programme Researcher, Broadcasting/film/video in the Last Year: Patient declined    Barista in the Last Year: Patient declined  Transportation Needs: Patient Declined (06/22/2024)   Epic    Lack of Transportation (Medical):  Patient declined    Lack of Transportation (Non-Medical): Patient declined  Physical Activity: Unknown (06/22/2024)   Exercise Vital Sign    Days of Exercise per Week: Patient declined    Minutes of Exercise per Session: Not on file  Stress: No Stress Concern Present (06/22/2024)   [employer] of Occupational Health - Occupational Stress Questionnaire    Feeling of Stress: Not at all  Social Connections: Unknown (06/22/2024)   Social Connection and Isolation Panel    Frequency of Communication with Friends and Family: Patient declined    Frequency of Social Gatherings with Friends and Family: Patient declined    Attends Religious Services: Patient declined    Database Administrator or Organizations: Patient declined    Attends Banker Meetings: Not on file    Marital Status: Married  Intimate Partner Violence: Not At Risk (03/08/2023)   Humiliation, Afraid, Rape, and Kick questionnaire    Fear of Current or Ex-Partner: No    Emotionally Abused: No    Physically Abused: No    Sexually Abused: No  Depression (PHQ2-9): Low Risk (12/24/2023)   Depression (PHQ2-9)    PHQ-2 Score: 2  Alcohol Screen: Not on file  Housing: Patient Declined (06/22/2024)   Epic    Unable to Pay for Housing in the Last Year: Patient declined    Number of Times Moved in the Last Year: Not on file     Homeless in the Last Year: Patient declined  Utilities: Not At Risk (03/08/2023)   [employer] Utilities    Threatened with loss of utilities: No  Health Literacy: Not on file      PHYSICAL EXAM  Vitals:   09/08/24 1458  BP: (!) 142/96  Pulse: 94  Weight: 162 lb 12.8 oz (73.8 kg)  Height: 5' 10 (1.778 m)   Body mass index is 23.36 kg/m.  Generalized: Well developed, in no acute distress   Neurological examination  Mentation: Alert oriented to time, place, history taking. Follows all commands speech and language fluent Cranial nerve II-XII: Pupils were equal round reactive to light. Extraocular movements were full, visual field were full on confrontational test. Facial sensation and strength were normal. Uvula tongue midline. Head turning and shoulder shrug  were normal and symmetric. Motor: The motor testing reveals 5 over 5 strength of all 4 extremities. Good symmetric motor tone is noted throughout.  Sensory: Sensory testing is intact to soft touch on all 4 extremities. No evidence of extinction is noted.  Coordination: Cerebellar testing reveals good finger-nose-finger and heel-to-shin bilaterally.  Gait and station: Gait is normal.  Reflexes: Deep tendon reflexes are symmetric and normal bilaterally.   DIAGNOSTIC DATA (LABS, IMAGING, TESTING) - I reviewed patient records, labs, notes, testing and imaging myself where available.  Lab Results  Component Value Date   WBC 12.0 (H) 03/06/2023   HGB 10.6 (L) 03/06/2023   HCT 35.1 (L) 03/06/2023   MCV 74.1 (L) 03/06/2023   PLT 244 03/06/2023      Component Value Date/Time   NA 139 01/16/2024 0813   NA 145 (H) 10/09/2017 1500   K 4.0 01/16/2024 0813   CL 108 01/16/2024 0813   CO2 23 01/16/2024 0813   GLUCOSE 79 01/16/2024 0813   BUN 14 01/16/2024 0813   BUN 8 10/09/2017 1500   CREATININE 0.83 01/16/2024 0813   CALCIUM 9.6 01/16/2024 0813   PROT 6.9 12/24/2023 1536   PROT 5.7 (L) 10/09/2017 1500   ALBUMIN  4.2  12/24/2023 1536  ALBUMIN  2.7 (L) 10/09/2017 1500   AST 13 12/24/2023 1536   ALT 11 12/24/2023 1536   ALKPHOS 54 12/24/2023 1536   BILITOT 0.5 12/24/2023 1536   BILITOT <0.2 10/09/2017 1500   GFRNONAA >60 03/12/2023 0843   GFRAA >60 03/25/2020 1513   Lab Results  Component Value Date   CHOL 143 12/24/2023   HDL 32.60 (L) 12/24/2023   LDLCALC 93 12/24/2023   LDLDIRECT 61.0 12/21/2019   TRIG 87.0 12/24/2023   CHOLHDL 4 12/24/2023   Lab Results  Component Value Date   HGBA1C 5.0 06/25/2024   No results found for: VITAMINB12 Lab Results  Component Value Date   TSH 0.97 01/16/2024      ASSESSMENT AND PLAN 39 y.o. year old female  has a past medical history of Acute conjunctivitis of right eye (07/10/2019), Acute pain of right knee (08/06/2019), Anemia (10/14/2019), Complicated UTI (urinary tract infection) (03/10/2021), COVID (2020), COVID-19, Diabetes mellitus without complication (HCC), Eczema, Hernia cerebri (HCC), Hidradenitis axillaris (04/20/2020), History of COVID-19 (12/21/2019), History of gestational diabetes (05/02/2017), History of severe pre-eclampsia (05/02/2017), Hypertension, Impingement syndrome of left shoulder (02/16/2020), Mastitis (06/18/2019), Migraine, PCOS (polycystic ovarian syndrome), Pneumonia, Pregnancy induced hypertension, and Skin lesions (09/12/2020). here with:  Migraine headaches  -She will remain off of Topamax  - Continue rizatriptan  for abortive therapy - We did discuss that if her migraine frequency or severity increases we can consider adding on another preventative medication such as propranolol, Zonegran or nortriptyline. - Advised if her symptoms worsen or she develops new symptoms she should let us  know - Follow-up in 1 year or sooner if needed    Duwaine Russell, MSN, NP-C 09/08/2024, 3:01 PM [facility] [address], [address] [place], [place] [address] [phone]      [1]  Allergies Allergen Reactions    Ancef  [Cefazolin ] Hives and Swelling    Facial and neck swelling; Had reaction after receiving fentanyl  and ancef  in the OR   Fentanyl  Hives and Swelling    Facial and neck swelling; Had reaction after receiving fentanyl  and ancef  in the OR   Shrimp [Shellfish Allergy] Swelling and Other (See Comments)    Swelling of throat   Chlorhexidine  Hives, Itching and Rash   Morphine And Codeine Rash   Pseudoephedrine Hcl Other (See Comments)    Light headed/ dizzy   "

## 2024-09-08 NOTE — Patient Instructions (Signed)
 Continue rizatriptan  for abortive therapy If migraine frequency increases we can consider adding on a preventative therapy such as propranolol, Zonegran or nortriptyline.

## 2024-09-09 ENCOUNTER — Other Ambulatory Visit: Payer: Self-pay | Admitting: Primary Care

## 2024-09-09 DIAGNOSIS — E1165 Type 2 diabetes mellitus with hyperglycemia: Secondary | ICD-10-CM

## 2024-12-23 ENCOUNTER — Ambulatory Visit: Admitting: Primary Care

## 2025-01-05 ENCOUNTER — Other Ambulatory Visit

## 2025-01-12 ENCOUNTER — Ambulatory Visit: Admitting: "Endocrinology

## 2025-09-13 ENCOUNTER — Ambulatory Visit: Admitting: Adult Health
